# Patient Record
Sex: Female | Born: 1960 | Race: White | Hispanic: No | Marital: Married | Smoking: Former smoker
Health system: Southern US, Community
[De-identification: ages and names within clinical notes are randomized; demographics above are authoritative.]

## PROBLEM LIST (undated history)

## (undated) DIAGNOSIS — M1990 Unspecified osteoarthritis, unspecified site: Secondary | ICD-10-CM

## (undated) DIAGNOSIS — F419 Anxiety disorder, unspecified: Secondary | ICD-10-CM

## (undated) DIAGNOSIS — N951 Menopausal and female climacteric states: Secondary | ICD-10-CM

## (undated) DIAGNOSIS — K5900 Constipation, unspecified: Secondary | ICD-10-CM

## (undated) DIAGNOSIS — K219 Gastro-esophageal reflux disease without esophagitis: Secondary | ICD-10-CM

## (undated) DIAGNOSIS — J439 Emphysema, unspecified: Secondary | ICD-10-CM

## (undated) DIAGNOSIS — M797 Fibromyalgia: Secondary | ICD-10-CM

## (undated) DIAGNOSIS — C3490 Malignant neoplasm of unspecified part of unspecified bronchus or lung: Secondary | ICD-10-CM

## (undated) DIAGNOSIS — I10 Essential (primary) hypertension: Secondary | ICD-10-CM

## (undated) HISTORY — DX: Menopausal and female climacteric states: N95.1

## (undated) HISTORY — PX: THORACOTOMY: SUR1349

## (undated) HISTORY — DX: Fibromyalgia: M79.7

## (undated) HISTORY — DX: Essential (primary) hypertension: I10

## (undated) HISTORY — DX: Constipation, unspecified: K59.00

## (undated) HISTORY — DX: Anxiety disorder, unspecified: F41.9

## (undated) HISTORY — PX: PORTACATH PLACEMENT: SHX2246

## (undated) HISTORY — PX: TUBAL LIGATION: SHX77

## (undated) HISTORY — DX: Malignant neoplasm of unspecified part of unspecified bronchus or lung: C34.90

---

## 2010-01-24 DIAGNOSIS — C3490 Malignant neoplasm of unspecified part of unspecified bronchus or lung: Secondary | ICD-10-CM

## 2010-01-24 HISTORY — DX: Malignant neoplasm of unspecified part of unspecified bronchus or lung: C34.90

## 2010-02-10 ENCOUNTER — Ambulatory Visit
Admission: RE | Admit: 2010-02-10 | Discharge: 2010-02-10 | Payer: Self-pay | Source: Home / Self Care | Attending: Thoracic Surgery | Admitting: Thoracic Surgery

## 2010-02-15 ENCOUNTER — Ambulatory Visit (HOSPITAL_COMMUNITY)
Admission: RE | Admit: 2010-02-15 | Discharge: 2010-02-15 | Payer: Self-pay | Source: Home / Self Care | Attending: Thoracic Surgery | Admitting: Thoracic Surgery

## 2010-02-16 ENCOUNTER — Ambulatory Visit (HOSPITAL_COMMUNITY)
Admission: RE | Admit: 2010-02-16 | Discharge: 2010-02-16 | Payer: Self-pay | Source: Home / Self Care | Attending: Thoracic Surgery | Admitting: Thoracic Surgery

## 2010-02-16 ENCOUNTER — Other Ambulatory Visit: Payer: Self-pay | Admitting: Interventional Radiology

## 2010-02-17 LAB — CBC
MCH: 28.8 pg (ref 26.0–34.0)
MCHC: 31.8 g/dL (ref 30.0–36.0)
MCV: 90.7 fL (ref 78.0–100.0)
Platelets: 493 10*3/uL — ABNORMAL HIGH (ref 150–400)
RBC: 4.3 MIL/uL (ref 3.87–5.11)

## 2010-02-18 ENCOUNTER — Ambulatory Visit (HOSPITAL_COMMUNITY)
Admission: RE | Admit: 2010-02-18 | Discharge: 2010-02-18 | Payer: Self-pay | Source: Home / Self Care | Attending: Thoracic Surgery | Admitting: Thoracic Surgery

## 2010-02-18 LAB — GLUCOSE, CAPILLARY: Glucose-Capillary: 107 mg/dL — ABNORMAL HIGH (ref 70–99)

## 2010-02-19 ENCOUNTER — Ambulatory Visit (HOSPITAL_COMMUNITY)
Admission: RE | Admit: 2010-02-19 | Discharge: 2010-02-19 | Payer: Self-pay | Source: Home / Self Care | Attending: Thoracic Surgery | Admitting: Thoracic Surgery

## 2010-02-19 ENCOUNTER — Ambulatory Visit
Admission: RE | Admit: 2010-02-19 | Discharge: 2010-02-19 | Payer: Self-pay | Source: Home / Self Care | Attending: Thoracic Surgery | Admitting: Thoracic Surgery

## 2010-02-19 NOTE — Letter (Signed)
February 19, 2010  Dr. Samuel Jester [address] [place], [place] [address]  Re:  [name], [name]               DOB:  Jul 14, 1960  Dear Dr. Charm Barges:  I saw the patient back in the office today after we completed all of her test.  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  A CT scan of the brain was negative for metastasis.  The CAT scan was positive in this area.  There was some question there might be some supraclavicular nodes that might be positive, but there is no mediastinal or hilar nodes that were positive.  She has a stage IIIA Pancoast tumor.  The treatment for this is radiation with chemotherapy.  She would like to have her treatments done in [place] and I will make arrangements next week to have her seen there to start radiation and chemotherapy.  After the radiation chemotherapy, then we will consider for resection of this tumor.  I appreciate the opportunity of seeing the patient.  I discussed this with her and her son.  Sincerely,  Ines Bloomer, M.D. Electronically Signed  DPB/MEDQ  D:  02/19/2010  T:  02/19/2010  Job:  [id number]

## 2010-02-24 ENCOUNTER — Encounter: Payer: Self-pay | Admitting: Thoracic Surgery

## 2010-03-03 ENCOUNTER — Ambulatory Visit: Payer: BC Managed Care – PPO | Admitting: Radiation Oncology

## 2010-03-03 ENCOUNTER — Ambulatory Visit: Payer: BC Managed Care – PPO | Attending: Radiation Oncology | Admitting: Radiation Oncology

## 2010-03-03 DIAGNOSIS — C3410 Malignant neoplasm of upper lobe, unspecified bronchus or lung: Secondary | ICD-10-CM | POA: Insufficient documentation

## 2010-03-05 ENCOUNTER — Encounter (HOSPITAL_COMMUNITY)
Admission: RE | Admit: 2010-03-05 | Discharge: 2010-03-05 | Disposition: A | Discharge status: Home / Self Care | Payer: BC Managed Care – PPO | Source: Ambulatory Visit | Attending: Thoracic Surgery | Admitting: Thoracic Surgery

## 2010-03-05 ENCOUNTER — Ambulatory Visit (HOSPITAL_COMMUNITY)
Admission: RE | Admit: 2010-03-05 | Discharge: 2010-03-05 | Disposition: A | Discharge status: Home / Self Care | Payer: BC Managed Care – PPO | Source: Ambulatory Visit | Attending: Thoracic Surgery | Admitting: Thoracic Surgery

## 2010-03-05 ENCOUNTER — Other Ambulatory Visit: Payer: Self-pay | Admitting: Thoracic Surgery

## 2010-03-05 DIAGNOSIS — Z01818 Encounter for other preprocedural examination: Secondary | ICD-10-CM | POA: Insufficient documentation

## 2010-03-05 DIAGNOSIS — C3490 Malignant neoplasm of unspecified part of unspecified bronchus or lung: Secondary | ICD-10-CM

## 2010-03-05 DIAGNOSIS — J984 Other disorders of lung: Secondary | ICD-10-CM | POA: Insufficient documentation

## 2010-03-05 LAB — COMPREHENSIVE METABOLIC PANEL
ALT: 23 U/L (ref 0–35)
Albumin: 3.7 g/dL (ref 3.5–5.2)
Alkaline Phosphatase: 141 U/L — ABNORMAL HIGH (ref 39–117)
Glucose, Bld: 92 mg/dL (ref 70–99)
Potassium: 4.1 mEq/L (ref 3.5–5.1)
Sodium: 137 mEq/L (ref 135–145)
Total Protein: 7.2 g/dL (ref 6.0–8.3)

## 2010-03-05 LAB — APTT: aPTT: 38 seconds — ABNORMAL HIGH (ref 24–37)

## 2010-03-05 LAB — CBC
HCT: 38.2 % (ref 36.0–46.0)
Hemoglobin: 12.5 g/dL (ref 12.0–15.0)
MCHC: 32.7 g/dL (ref 30.0–36.0)
MCV: 90.1 fL (ref 78.0–100.0)

## 2010-03-05 LAB — PROTIME-INR: INR: 0.97 (ref 0.00–1.49)

## 2010-03-08 ENCOUNTER — Ambulatory Visit (HOSPITAL_COMMUNITY)
Admission: RE | Admit: 2010-03-08 | Discharge: 2010-03-08 | Disposition: A | Discharge status: Home / Self Care | Payer: BC Managed Care – PPO | Source: Ambulatory Visit | Attending: Thoracic Surgery | Admitting: Thoracic Surgery

## 2010-03-08 ENCOUNTER — Ambulatory Visit (HOSPITAL_COMMUNITY): Payer: BC Managed Care – PPO

## 2010-03-08 DIAGNOSIS — C3490 Malignant neoplasm of unspecified part of unspecified bronchus or lung: Secondary | ICD-10-CM

## 2010-03-08 DIAGNOSIS — I10 Essential (primary) hypertension: Secondary | ICD-10-CM | POA: Insufficient documentation

## 2010-03-08 DIAGNOSIS — Z01818 Encounter for other preprocedural examination: Secondary | ICD-10-CM | POA: Insufficient documentation

## 2010-03-08 DIAGNOSIS — C3410 Malignant neoplasm of upper lobe, unspecified bronchus or lung: Secondary | ICD-10-CM | POA: Insufficient documentation

## 2010-03-08 HISTORY — PX: PORTACATH PLACEMENT: SHX2246

## 2010-03-11 NOTE — Op Note (Signed)
  NAMESINDIA, KOWALCZYK             ACCOUNT NO.:  [id number]  MEDICAL RECORD NO.:  [id number]           PATIENT TYPE:  O  LOCATION:  [facility]SC                         FACILITY:  [facility]  PHYSICIAN:  Ines Bloomer, M.D. DATE OF BIRTH:  04-06-1960  DATE OF PROCEDURE: DATE OF DISCHARGE:  03/08/2010                              OPERATIVE REPORT   PREOPERATIVE DIAGNOSIS:  Non-small cell lung cancer, left upper lobe, Pancoast or superior sulcus tumor.  POSTOPERATIVE DIAGNOSIS:  Non-small cell lung cancer, left upper lobe, Pancoast or superior sulcus tumor.  OPERATION PERFORMED:  Right IJ Port-A-Cath.  After local anesthesia with Cetacaine, Xylocaine and IV sedation, right IJ area was infiltrated with 1% Xylocaine suprapubically and right IJ puncture was performed and a guidewire threaded under fluoro on to the right atrium.  Stab wound was made around the guidewire.  Another area was infiltrated with 1% Xylocaine inferior to this.  A transverse incision was made and dissection was carried down through the subcutaneous tissue to the pretracheal fascia.  Pocket was dissected out and a 9.6 preattached Bard Port-A-Cath was inserted and then sutured in place with 2-0 silk.  The tubing was then tunneled from the pocket to the stab wound and measured appropriately with fluoro to go to the right atrial SVC junction.  With a guidewire was passed the dilator with a peel-away sheath, the guidewire and the dilator were removed and through the peel-away sheath was passed the tubing.  The peel-away sheath was removed and was confirmed to be in the distal SVC with fluoro.  It flushed easily and withdrew easily.  Wound was closed with 3-0 Vicryl and Dermabond for the skin.  The patient was turned to recovery room in stable condition.     Ines Bloomer, M.D.     DPB/MEDQ  D:  03/08/2010  T:  03/08/2010  Job:  [id number]  Electronically Signed by Jovita Gamma M.D. on 03/11/2010 03:39:42 PM

## 2010-03-16 ENCOUNTER — Ambulatory Visit (INDEPENDENT_AMBULATORY_CARE_PROVIDER_SITE_OTHER): Payer: Self-pay | Admitting: Thoracic Surgery

## 2010-03-16 DIAGNOSIS — C3410 Malignant neoplasm of upper lobe, unspecified bronchus or lung: Secondary | ICD-10-CM

## 2010-03-17 NOTE — Assessment & Plan Note (Signed)
OFFICE VISIT  [name], [name] DOB:  Feb 26, 1960                                        March 16, 2010 CHART #:  [id number]  HISTORY:  The patient is a 50 year old female.  We have been consulted on for a stage IIIA Pancoast tumor.  She has planned a chemotherapy and radiation prior to proceeding with any surgical intervention.  A right internal jugular Port-A-Cath was placed by Dr. Edwyna Shell on March 08, 2010.  We are seeing her in the office just to evaluate the surgical site.  PHYSICAL EXAMINATION:  Vital Signs:  Blood pressure is 102/65, pulse was 84, respirations 14, oxygen saturation is 97% on room air.  General Appearance:  This is a well-developed adult female in no acute distress. Pulmonary examination reveals clear lungs bilaterally.  Cardiac Examination:  Regular rate and rhythm.  The catheter site is inspected, healing well without evidence of infection.  There is no evidence of hematoma.  It appears to be healing quite well.  ASSESSMENT:  The patient is very stable in regard to her Port-A-Cath placement.  She is to continue her current treatment plan with chemo radiation and hopefully, there will be some ability to proceed with surgical resection after reevaluation down the road.  At this time, we do not have a plan to follow up appointment, but we will certainly see her on a p.r.n. basis and also after her therapies have included to evaluate for surgical options.  Rowe Clack, P.A.-C.  Sherryll Burger  D:  03/16/2010  T:  03/17/2010  Job:  [id number]  cc:   Samuel Jester, DO Prudy Feeler, [place] Radene Gunning, M.D., Ph.D. Earma Reading, MD

## 2010-06-01 ENCOUNTER — Ambulatory Visit (INDEPENDENT_AMBULATORY_CARE_PROVIDER_SITE_OTHER): Payer: BC Managed Care – PPO | Admitting: Thoracic Surgery

## 2010-06-01 DIAGNOSIS — C3490 Malignant neoplasm of unspecified part of unspecified bronchus or lung: Secondary | ICD-10-CM

## 2010-06-02 NOTE — Letter (Signed)
Jun 01, 2010  Boris M. Darovsky, MD [address] [place], [place] [address]  Re:  MAIE, KESINGER               DOB:  [birth year]  Dear Dr. Ubaldo Glassing:  I saw the patient back today.  She is doing well.  She had a good response to her radiation chemotherapy.  The lesion has shrunk from 4.4 x 3.6 to 2.8 x 2.1 cm.  She still may have some involvement of her subclavian artery and possibly the subclavian vein. She has had no more pain and hopefully that there is minimal involvement of this subclavian artery and of the brachial plexus.  We plan to get an MRI at this just to be sure and we will see her again in one week to re-discuss the possibility of surgery which we tentatively scheduled for May 21 at [facility].  Her blood pressure is 146/84, pulse 78, respirations 20, and sats were 96%.  Ines Bloomer, M.D. Electronically Signed  DPB/MEDQ  D:  06/01/2010  T:  06/02/2010  Job:  [id number]

## 2010-06-08 NOTE — Letter (Signed)
February 10, 2010   [name] Jester, DO  [name] [address]  [place], [place] [address]   Re:  [name], [name]               DOB:  1960-03-12   Dear Dr. Charm [name]:   I saw the patient in the office today.  This 50 year old patient was  married 2 years ago and has been a long-time smoker.  She comes in today  with her husband.  She started having some left neck and back pain and  underwent a MRI and then a CT scan which showed an anterior Pancoast or  superior sulcus tumor with involvement of the subclavian artery vein,  probably the brachial plexus.  The mass is at least for 4.6 x 4.5 cm  with elevation of the first and second rib and comes right up to the  right vertebral artery and partially encases the left subclavian artery  and vein.  She has no hemoptysis, fever, chills, or excessive sputum.   MEDICATIONS:  1. Lisinopril 40 mg daily for hypertension.  2. Xanax 0.5.  3. She is on antidepressant.   ALLERGIES:  She has no allergies.   FAMILY HISTORY:  Noncontributory.   SOCIAL HISTORY:  She is married and has two children.  She is self-  employed.  She has quitting smoking but smoked half pack of cigarettes a  day.   REVIEW OF SYSTEMS:  GENERAL:  She is 5 feet 4 inches, 135 pounds.  Her  weight has been stable.  CARDIAC:  No angina or atrial fibrillation.  PULMONARY:  No hemoptysis, fever, chills.  GI:  No nausea, vomiting, constipation, or diarrhea.  GU:  No kidney disease, dysuria, or frequent urination.  VASCULAR:  No claudication, DVT, TIAs.  NEUROLOGIC:  No dizziness, headaches, blackouts, or seizures.  MUSCULOSKELETAL:  See history of present illness.  GI:  No depression or nervousness.  ENT:  No change in eyesight or hearing.  HEMATOLOGIC:  No problems with bleeding, clotting disorders, or anemia.   PHYSICAL EXAMINATION:  General:  She is a well-developed Caucasian  female in no acute distress.  Vital Signs:  Her blood pressure is  151/91, pulse 88, respirations 20, sats  are 99%.  Head, Eyes, Ears,  Nose, and Throat:  Unremarkable.  Neck:  Supple on the right side and  there is a questionable palpable mass in the supraclavicular area on the  left side and some mild tenderness.  Lungs:  Clear to auscultation and  percussion.  Heart:  Regular sinus rhythm.  No murmur.  Abdomen:  Soft.  There is no splenomegaly.  Extremities:  Pulses are 2+.  There is no  clubbing or edema.  Neurologic:  She is oriented x3.  Sensory and motor  intact.  Cranial nerves intact.   ASSESSMENT AND PLAN:  I think unfortunately she has an anterior Pancoast  tumor with both neurological and vessel involvement.  For this reason,  we will get a brain scan, PET scan, and PFTs with DLCO, and then I will  go ahead and get a needle biopsy on her and then we will reevaluate her.  She will definitely need preop radiation and chemotherapy and possibly  surgery.  She is not a surgical candidate.  She will need full-dose  radiation therapy.   [name], M.D.  Electronically Signed   [name]/[name]  D:  02/10/2010  T:  02/10/2010  [name]:  [id number]   cc:   [name] Loffler, PA

## 2010-06-09 ENCOUNTER — Other Ambulatory Visit (HOSPITAL_COMMUNITY): Payer: BC Managed Care – PPO

## 2010-06-09 ENCOUNTER — Encounter (HOSPITAL_COMMUNITY)
Admission: RE | Admit: 2010-06-09 | Discharge: 2010-06-09 | Disposition: A | Discharge status: Home / Self Care | Payer: BC Managed Care – PPO | Source: Ambulatory Visit | Attending: Thoracic Surgery | Admitting: Thoracic Surgery

## 2010-06-09 ENCOUNTER — Ambulatory Visit (INDEPENDENT_AMBULATORY_CARE_PROVIDER_SITE_OTHER): Payer: BC Managed Care – PPO | Admitting: Thoracic Surgery

## 2010-06-09 DIAGNOSIS — D491 Neoplasm of unspecified behavior of respiratory system: Secondary | ICD-10-CM

## 2010-06-09 DIAGNOSIS — Z01812 Encounter for preprocedural laboratory examination: Secondary | ICD-10-CM | POA: Insufficient documentation

## 2010-06-09 DIAGNOSIS — Z01818 Encounter for other preprocedural examination: Secondary | ICD-10-CM | POA: Insufficient documentation

## 2010-06-09 LAB — CBC
Hemoglobin: 9.8 g/dL — ABNORMAL LOW (ref 12.0–15.0)
MCH: 30.4 pg (ref 26.0–34.0)
MCV: 91 fL (ref 78.0–100.0)
RBC: 3.22 MIL/uL — ABNORMAL LOW (ref 3.87–5.11)

## 2010-06-09 LAB — APTT: aPTT: 31 seconds (ref 24–37)

## 2010-06-09 LAB — URINALYSIS, ROUTINE W REFLEX MICROSCOPIC
Bilirubin Urine: NEGATIVE
Hgb urine dipstick: NEGATIVE
Specific Gravity, Urine: 1.01 (ref 1.005–1.030)
pH: 6 (ref 5.0–8.0)

## 2010-06-09 LAB — BLOOD GAS, ARTERIAL
Acid-Base Excess: 2.8 mmol/L — ABNORMAL HIGH (ref 0.0–2.0)
Bicarbonate: 26.6 mEq/L — ABNORMAL HIGH (ref 20.0–24.0)
O2 Saturation: 96.6 %
pO2, Arterial: 81.8 mmHg (ref 80.0–100.0)

## 2010-06-09 LAB — COMPREHENSIVE METABOLIC PANEL
ALT: 27 U/L (ref 0–35)
BUN: 21 mg/dL (ref 6–23)
CO2: 26 mEq/L (ref 19–32)
Calcium: 9.3 mg/dL (ref 8.4–10.5)
Creatinine, Ser: 0.79 mg/dL (ref 0.4–1.2)
GFR calc non Af Amer: >60 mL/min (ref >60)
Glucose, Bld: 105 mg/dL — ABNORMAL HIGH (ref 70–99)

## 2010-06-09 LAB — ABO/RH: ABO/RH(D): B POS

## 2010-06-09 LAB — PROTIME-INR: INR: 0.93 (ref 0.00–1.49)

## 2010-06-09 LAB — SURGICAL PCR SCREEN: MRSA, PCR: NEGATIVE

## 2010-06-10 NOTE — Letter (Signed)
Jun 09, 2010  Boris M. Darovsky, MD [address] [place], [place] [address]  Re:  AZAYLA, POLO               DOB:  July 07, 1960  Dear Dr. Ubaldo Glassing:  I saw the patient back today after her MRI which shows that she probably has some involvement of T1 nerve root and the first and second ribs. The subclavian artery runs right over the mass but hopefully we will be able to not have to resect the subclavian artery.  Plan to do an anterior approach to the Pancoast or superior sulcus tumor with a partial sternectomy and rib resection of the first, second, and possibly third ribs.  We will do this on Monday.  I explained to her the risk of the procedure including resection of the subclavian artery, resection of the lower portion of the brachial plexus which C8 and T1, Horner syndrome, infection, as well as hemorrhage.  We will also plan to do a left lower lobectomy and may have to make a counterincision also to do this.  She understands the possibilities and we plan to proceed with surgery.  Her blood pressure was 130/80, pulse 78, respirations 18, and sats were 97%.  Ines Bloomer, M.D. Electronically Signed  DPB/MEDQ  D:  06/09/2010  T:  06/10/2010  Job:  [id number]

## 2010-06-14 NOTE — H&P (Signed)
NAMEGERARDA, [name]             ACCOUNT NO.:  [id number]  MEDICAL RECORD NO.:  [id number]           PATIENT TYPE:  LOCATION:                                 FACILITY:  PHYSICIAN:  Ines Bloomer, M.D. DATE OF BIRTH:  08/07/1960  DATE OF ADMISSION: DATE OF DISCHARGE:                             HISTORY & PHYSICAL   CHIEF COMPLAINT:  Left lung mass.  HISTORY OF PRESENT ILLNESS:  This is a 50 year old patient who presented with severe chest pain several months ago and was found to have a superior sulcus tumor of the left upper lobe that was adenocarcinoma. It had invaded a T1 nerve root causing pain and was close to the left subclavian artery.  It was 4.4 x 3.6 and repeat CT scan showed that it has shrunk to 2.8 x 2.1, although on MRI it was somewhat bigger by measurements.  It was thought to be on MRI to involve the T1 nerve.  The scalene medius probably was close to the left subclavian artery and involved the first and second ribs.  She has got a good response to radiation and chemotherapy.  She is being admitted for a resection of the superior sulcus tumor using the anterior approach.  MEDICATIONS:  Lisinopril 40 mg a day, Xanax, hydrocodone p.r.n., and Paxil 20 mg daily.  She is not allergic to any medications.  She has no allergies.  FAMILY HISTORY:  Noncontributory.  SOCIAL HISTORY:  She is married, has two children, self-employed, owns her own business, quit smoking just recently.  REVIEW OF SYSTEMS:  GENERAL:  She is 5 feet 4 inches, 135 pounds. Weight is stable.  CARDIAC:  No angina or atrial fibrillation. PULMONARY:  See history of present illness.  No hemoptysis. GASTROINTESTINAL:  No nausea, vomiting, constipation, or diarrhea. GENITOURINARY:  No kidney disease, dysuria, or frequent urination. VASCULAR:  No claudication, DVT, TIAs.  NEUROLOGICAL:  No dizziness, headaches, blackouts, seizures.  MUSCULOSKELETAL:  See history of present illness.  PSYCHIATRIC:   Some questionable depression.  No nervousness.  ENT:  No change in her eyesight or hearing.  HEMATOLOGIC: No problems with bleeding, clotting disorders, or anemia.  PHYSICAL EXAMINATION:  GENERAL:  She is a well-developed Caucasian female, in no acute distress. VITAL SIGNS:  Her blood pressure is 146/84, pulse 78, respirations 20, sats were 96%. HEENT:  Head is atraumatic.  Eyes:  Pupils equal and reactive to light and accommodation.  Extraocular movements are normal.  Ears:  Tympanic membranes are intact.  Nose:  There is no septal deviation.  Throat: Without lesion. NECK:  Supple without thyromegaly.  There are no palpable masses in the left supraclavicular area, although there is some questionable fullness. CHEST:  Clear to auscultation and percussion. HEART:  Regular sinus rhythm.  No murmurs. ABDOMEN:  Soft.  There is no hepatosplenomegaly. EXTREMITIES:  Pulses 2+.  There is no clubbing or edema. NEUROLOGIC:  She is oriented x3.  Sensory and motor intact.  Cranial nerves are intact.  Pulmonary function test:  FEV-1 is 3.39 and 99% predicted.  FVC is 3.39 and 99% predicted, and FEV-1 is 2.4, 88% predicted, diffusion capacity  is 73%.  IMPRESSION: 1. 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 2. History of tobacco abuse. 3. Situational depression.  PLAN:  Left anterior thoracotomy with left upper lobectomy, wedge resection, and resection of Pancoast tumor possible, and reconstruction of chest wall.     Ines Bloomer, M.D.     DPB/MEDQ  D:  06/09/2010  T:  06/10/2010  Job:  [id number]  Electronically Signed by Jovita Gamma M.D. on 06/14/2010 02:19:05 PM

## 2010-06-23 ENCOUNTER — Encounter (HOSPITAL_COMMUNITY)
Admission: RE | Admit: 2010-06-23 | Discharge: 2010-06-23 | Disposition: A | Discharge status: Home / Self Care | Payer: BC Managed Care – PPO | Source: Ambulatory Visit | Attending: Thoracic Surgery | Admitting: Thoracic Surgery

## 2010-06-23 ENCOUNTER — Other Ambulatory Visit: Payer: Self-pay | Admitting: Thoracic Surgery

## 2010-06-23 ENCOUNTER — Ambulatory Visit (HOSPITAL_COMMUNITY)
Admission: RE | Admit: 2010-06-23 | Discharge: 2010-06-23 | Disposition: A | Discharge status: Home / Self Care | Payer: BC Managed Care – PPO | Source: Ambulatory Visit | Attending: Thoracic Surgery | Admitting: Thoracic Surgery

## 2010-06-23 ENCOUNTER — Ambulatory Visit (INDEPENDENT_AMBULATORY_CARE_PROVIDER_SITE_OTHER): Payer: Self-pay | Admitting: Thoracic Surgery

## 2010-06-23 DIAGNOSIS — Z01818 Encounter for other preprocedural examination: Secondary | ICD-10-CM | POA: Insufficient documentation

## 2010-06-23 DIAGNOSIS — Z01811 Encounter for preprocedural respiratory examination: Secondary | ICD-10-CM

## 2010-06-23 DIAGNOSIS — Z01812 Encounter for preprocedural laboratory examination: Secondary | ICD-10-CM | POA: Insufficient documentation

## 2010-06-23 DIAGNOSIS — D491 Neoplasm of unspecified behavior of respiratory system: Secondary | ICD-10-CM

## 2010-06-23 LAB — CBC
Hemoglobin: 10.6 g/dL — ABNORMAL LOW (ref 12.0–15.0)
MCH: 30.6 pg (ref 26.0–34.0)
MCHC: 33.7 g/dL (ref 30.0–36.0)

## 2010-06-23 LAB — PROTIME-INR
INR: 0.95 (ref 0.00–1.49)
Prothrombin Time: 12.9 seconds (ref 11.6–15.2)

## 2010-06-23 LAB — COMPREHENSIVE METABOLIC PANEL
AST: 38 U/L — ABNORMAL HIGH (ref 0–37)
Albumin: 3.4 g/dL — ABNORMAL LOW (ref 3.5–5.2)
Calcium: 9 mg/dL (ref 8.4–10.5)
Creatinine, Ser: 0.91 mg/dL (ref 0.4–1.2)
GFR calc Af Amer: >60 mL/min (ref >60)
GFR calc non Af Amer: >60 mL/min (ref >60)

## 2010-06-23 LAB — BLOOD GAS, ARTERIAL
Bicarbonate: 26.1 mEq/L — ABNORMAL HIGH (ref 20.0–24.0)
FIO2: 0.21 %
O2 Saturation: 95.7 %
Patient temperature: 98.6

## 2010-06-23 LAB — URINALYSIS, ROUTINE W REFLEX MICROSCOPIC
Bilirubin Urine: NEGATIVE
Glucose, UA: NEGATIVE mg/dL
Hgb urine dipstick: NEGATIVE
Ketones, ur: NEGATIVE mg/dL
Protein, ur: NEGATIVE mg/dL

## 2010-06-23 LAB — APTT: aPTT: 28 seconds (ref 24–37)

## 2010-06-25 ENCOUNTER — Inpatient Hospital Stay (HOSPITAL_COMMUNITY): Payer: BC Managed Care – PPO

## 2010-06-25 ENCOUNTER — Inpatient Hospital Stay (HOSPITAL_COMMUNITY)
Admission: RE | Admit: 2010-06-25 | Discharge: 2010-07-01 | DRG: 075 | Disposition: A | Discharge status: Home / Self Care | Payer: BC Managed Care – PPO | Source: Ambulatory Visit | Attending: Thoracic Surgery | Admitting: Thoracic Surgery

## 2010-06-25 ENCOUNTER — Other Ambulatory Visit: Payer: Self-pay | Admitting: Thoracic Surgery

## 2010-06-25 ENCOUNTER — Inpatient Hospital Stay (HOSPITAL_COMMUNITY)
Admission: RE | Admit: 2010-06-25 | Payer: BC Managed Care – PPO | Source: Ambulatory Visit | Admitting: Thoracic Surgery

## 2010-06-25 DIAGNOSIS — E876 Hypokalemia: Secondary | ICD-10-CM | POA: Diagnosis not present

## 2010-06-25 DIAGNOSIS — Z79899 Other long term (current) drug therapy: Secondary | ICD-10-CM

## 2010-06-25 DIAGNOSIS — C761 Malignant neoplasm of thorax: Secondary | ICD-10-CM

## 2010-06-25 DIAGNOSIS — C3410 Malignant neoplasm of upper lobe, unspecified bronchus or lung: Principal | ICD-10-CM | POA: Diagnosis present

## 2010-06-25 DIAGNOSIS — Z23 Encounter for immunization: Secondary | ICD-10-CM

## 2010-06-25 DIAGNOSIS — I10 Essential (primary) hypertension: Secondary | ICD-10-CM | POA: Diagnosis present

## 2010-06-25 DIAGNOSIS — Z01812 Encounter for preprocedural laboratory examination: Secondary | ICD-10-CM

## 2010-06-25 DIAGNOSIS — Z01810 Encounter for preprocedural cardiovascular examination: Secondary | ICD-10-CM

## 2010-06-25 DIAGNOSIS — Z01811 Encounter for preprocedural respiratory examination: Secondary | ICD-10-CM

## 2010-06-25 DIAGNOSIS — F4321 Adjustment disorder with depressed mood: Secondary | ICD-10-CM | POA: Diagnosis present

## 2010-06-25 DIAGNOSIS — Z87891 Personal history of nicotine dependence: Secondary | ICD-10-CM

## 2010-06-25 DIAGNOSIS — D62 Acute posthemorrhagic anemia: Secondary | ICD-10-CM | POA: Diagnosis not present

## 2010-06-25 HISTORY — PX: THORACOTOMY: SUR1349

## 2010-06-26 ENCOUNTER — Inpatient Hospital Stay (HOSPITAL_COMMUNITY): Payer: BC Managed Care – PPO

## 2010-06-26 LAB — BASIC METABOLIC PANEL
CO2: 25 mEq/L (ref 19–32)
Calcium: 7.8 mg/dL — ABNORMAL LOW (ref 8.4–10.5)
Chloride: 102 mEq/L (ref 96–112)
Glucose, Bld: 160 mg/dL — ABNORMAL HIGH (ref 70–99)
Sodium: 136 mEq/L (ref 135–145)

## 2010-06-26 LAB — POCT I-STAT 3, ART BLOOD GAS (G3+)
Acid-base deficit: 1 mmol/L (ref 0.0–2.0)
Bicarbonate: 24.6 mEq/L — ABNORMAL HIGH (ref 20.0–24.0)
O2 Saturation: 93 %
pCO2 arterial: 41.5 mmHg (ref 35.0–45.0)
pO2, Arterial: 68 mmHg — ABNORMAL LOW (ref 80.0–100.0)

## 2010-06-26 LAB — TYPE AND SCREEN: Unit division: 0

## 2010-06-26 LAB — GLUCOSE, CAPILLARY
Glucose-Capillary: 112 mg/dL — ABNORMAL HIGH (ref 70–99)
Glucose-Capillary: 117 mg/dL — ABNORMAL HIGH (ref 70–99)

## 2010-06-26 LAB — CBC
HCT: 32.9 % — ABNORMAL LOW (ref 36.0–46.0)
Hemoglobin: 10.8 g/dL — ABNORMAL LOW (ref 12.0–15.0)
MCHC: 32.8 g/dL (ref 30.0–36.0)
RBC: 3.52 MIL/uL — ABNORMAL LOW (ref 3.87–5.11)

## 2010-06-27 ENCOUNTER — Inpatient Hospital Stay (HOSPITAL_COMMUNITY): Payer: BC Managed Care – PPO

## 2010-06-27 LAB — COMPREHENSIVE METABOLIC PANEL
ALT: 16 U/L (ref 0–35)
AST: 18 U/L (ref 0–37)
Albumin: 2.3 g/dL — ABNORMAL LOW (ref 3.5–5.2)
Alkaline Phosphatase: 60 U/L (ref 39–117)
Chloride: 100 mEq/L (ref 96–112)
GFR calc Af Amer: >60 mL/min (ref >60)
Potassium: 4.1 mEq/L (ref 3.5–5.1)
Sodium: 133 mEq/L — ABNORMAL LOW (ref 135–145)
Total Bilirubin: 0.4 mg/dL (ref 0.3–1.2)
Total Protein: 5.3 g/dL — ABNORMAL LOW (ref 6.0–8.3)

## 2010-06-27 LAB — CBC
MCV: 94 fL (ref 78.0–100.0)
Platelets: 300 10*3/uL (ref 150–400)
RBC: 3.16 MIL/uL — ABNORMAL LOW (ref 3.87–5.11)
RDW: 15.5 % (ref 11.5–15.5)
WBC: 19.5 10*3/uL — ABNORMAL HIGH (ref 4.0–10.5)

## 2010-06-27 LAB — GLUCOSE, CAPILLARY: Glucose-Capillary: 101 mg/dL — ABNORMAL HIGH (ref 70–99)

## 2010-06-28 ENCOUNTER — Inpatient Hospital Stay (HOSPITAL_COMMUNITY): Payer: BC Managed Care – PPO

## 2010-06-28 LAB — GLUCOSE, CAPILLARY
Glucose-Capillary: 97 mg/dL (ref 70–99)
Glucose-Capillary: 80 mg/dL (ref 70–99)

## 2010-06-28 LAB — COMPREHENSIVE METABOLIC PANEL
AST: 17 U/L (ref 0–37)
CO2: 27 mEq/L (ref 19–32)
Calcium: 8.7 mg/dL (ref 8.4–10.5)
Creatinine, Ser: 0.59 mg/dL (ref 0.4–1.2)
GFR calc Af Amer: >60 mL/min (ref >60)
GFR calc non Af Amer: >60 mL/min (ref >60)
Glucose, Bld: 112 mg/dL — ABNORMAL HIGH (ref 70–99)

## 2010-06-28 LAB — CBC
Hemoglobin: 8.9 g/dL — ABNORMAL LOW (ref 12.0–15.0)
MCH: 31 pg (ref 26.0–34.0)
MCHC: 33.3 g/dL (ref 30.0–36.0)
RDW: 14.9 % (ref 11.5–15.5)

## 2010-06-29 ENCOUNTER — Inpatient Hospital Stay (HOSPITAL_COMMUNITY): Payer: BC Managed Care – PPO

## 2010-06-29 LAB — CBC
HCT: 25.7 % — ABNORMAL LOW (ref 36.0–46.0)
Hemoglobin: 8.7 g/dL — ABNORMAL LOW (ref 12.0–15.0)
MCH: 31.4 pg (ref 26.0–34.0)
MCHC: 33.9 g/dL (ref 30.0–36.0)

## 2010-06-29 LAB — BASIC METABOLIC PANEL
CO2: 30 mEq/L (ref 19–32)
Calcium: 9.1 mg/dL (ref 8.4–10.5)
Creatinine, Ser: 0.61 mg/dL (ref 0.4–1.2)
Glucose, Bld: 107 mg/dL — ABNORMAL HIGH (ref 70–99)

## 2010-06-29 LAB — GLUCOSE, CAPILLARY

## 2010-06-30 ENCOUNTER — Inpatient Hospital Stay (HOSPITAL_COMMUNITY): Payer: BC Managed Care – PPO

## 2010-06-30 LAB — GLUCOSE, CAPILLARY: Glucose-Capillary: 99 mg/dL (ref 70–99)

## 2010-07-01 ENCOUNTER — Inpatient Hospital Stay (HOSPITAL_COMMUNITY): Payer: BC Managed Care – PPO

## 2010-07-01 LAB — BASIC METABOLIC PANEL
CO2: 23 mEq/L (ref 19–32)
Calcium: 6.5 mg/dL — ABNORMAL LOW (ref 8.4–10.5)
Chloride: 109 mEq/L (ref 96–112)
Sodium: 140 mEq/L (ref 135–145)

## 2010-07-01 LAB — CBC
Hemoglobin: 7.6 g/dL — ABNORMAL LOW (ref 12.0–15.0)
MCHC: 34.2 g/dL (ref 30.0–36.0)
RBC: 2.44 MIL/uL — ABNORMAL LOW (ref 3.87–5.11)

## 2010-07-05 ENCOUNTER — Other Ambulatory Visit: Payer: Self-pay | Admitting: Thoracic Surgery

## 2010-07-05 ENCOUNTER — Ambulatory Visit
Admission: RE | Admit: 2010-07-05 | Discharge: 2010-07-05 | Disposition: A | Discharge status: Home / Self Care | Payer: BC Managed Care – PPO | Source: Ambulatory Visit | Attending: Thoracic Surgery | Admitting: Thoracic Surgery

## 2010-07-05 DIAGNOSIS — C3410 Malignant neoplasm of upper lobe, unspecified bronchus or lung: Secondary | ICD-10-CM

## 2010-07-06 ENCOUNTER — Ambulatory Visit (INDEPENDENT_AMBULATORY_CARE_PROVIDER_SITE_OTHER): Payer: Self-pay | Admitting: Thoracic Surgery

## 2010-07-06 DIAGNOSIS — C3490 Malignant neoplasm of unspecified part of unspecified bronchus or lung: Secondary | ICD-10-CM

## 2010-07-07 NOTE — Assessment & Plan Note (Signed)
OFFICE VISIT  KAYTIE, RATCLIFFE T DOB:  04/18/1960                                        July 06, 2010 CHART #:  [id number]  Ms. Toulouse turns today after having her surgery.  We repeated her labs and her hematocrit is up to 31, however, white count 17,000, and her platelet count 693,000.  Chest x-ray shows no evidence of any pneumonia but with a small left effusion which would go along after her left upper lobectomy and obviously elevation of her left diaphragm.  Her incision is healing well.  There is no erythema and hopefully over the next 2 weeks gradually the edema will subside.  I told her that she could start driving next week, not lifting over 10 pounds, and we would see her back again in 2 weeks with a chest x-ray.  Her blood pressure was 137/87, pulse 100, respirations 18, and sats were 94%.  Ines Bloomer, M.D. Electronically Signed  DPB/MEDQ  D:  07/06/2010  T:  07/07/2010  Job:  [id number]  cc:   Normand Sloop, MD

## 2010-07-12 NOTE — Op Note (Signed)
NAMEBRITTANNI, CARIKER NO.:  [id number]  MEDICAL RECORD NO.:  [id number]  LOCATION:  3305                         FACILITY:  [facility]  PHYSICIAN:  Sheliah Plane, MD    DATE OF BIRTH:  02-Jan-1961  DATE OF PROCEDURE:  06/25/2010 DATE OF DISCHARGE:  07/01/2010                              OPERATIVE REPORT     PREOPERATIVE DIAGNOSIS:  Pancoast tumor.  POSTOPERATIVE DIAGNOSIS:  Pancoast tumor.  PROCEDURE PERFORMED:  Transmanubrial anterolateral thoracotomy with chest wall resection including the first rib and left upper lobectomy for Pancoast tumor.  SURGEON:  Sheliah Plane, MD  FIRST ASSISTANT:  Ines Bloomer, MD  SECOND ASSISTANT:  Rowe Clack, PA-C  BRIEF HISTORY:  The patient is a 50 year old female who has a known non- small cell carcinoma of the lung involving the left apex somewhat situated anteriorly with neuropathic pain.  She had previous needle biopsy for pathologic diagnosis and the patient was initially treated with radiation therapy and then referred for consideration of chest wall resection.  Because of the position of the mass abutting the subclavian artery, axillary artery and brachial plexus and its somewhat anterior location, it was decided to use a transmanubrial approach.  Risks and options including nerve and arterial damage were discussed with the patient in detail prior to surgery.  DESCRIPTION OF PROCEDURE:  The patient underwent general endotracheal anesthesia without incident.  A double-lumen endotracheal tube was placed and position confirmed.  The patient was then placed with a partial lateral position with the left arm out laterally and a roll under the shoulder to allow both partial sternotomy and thoracotomy. The neck and chest was prepped and draped in a sterile manner. Initially, incision was made in the midline of the sternum and a sternal saw was used to divide the manubrium in the midline and then across  the second intercostal space opened the internal mammary artery and vein were divided.  With a rolltrack the manubrium with the attached clavicle was  elevated up slightly and the underlying tissue was dissected off the sternum and the clavicle to the clavicular fascia.  The subclavian artery was identified.  The dissection was carried along medial to lateral.  The tumor itself was identified and was adherent to the undersurface of the first rib.  There was a dissection plane between the tumor and the left subclavian artery.  The incision had been brought down along the anterior sternocleidal mastoid muscle in addition and this gave exposure to identify the jugular vein, the subclavian vein as it enters into the innominate vein.  This was all mobilized and encircled with vessel loops.  The origin of the left subclavian artery was identified and the takeoff of the thyroidal branches and vertebral were all identified.  Vessel loops were placed proximally and distally around the subclavian artery and further dissection along where the subclavian artery was in proximity to the tumor, there was no definite invasion into the artery and the artery was able to be dissected free and separated as was the vein.  Biopsies in this area were sent and were without evidence of tumor.  With the artery and the vein dissected free of the tumor,  the first rib was identified anteriorly was divided and then disarticulated posteriorly separating the tumor with the rib. There was not direct invasion into the second rib.  With the upper portion of this dissected free through the same incision this was extended anterolaterally.  The hilum of the left lung was identified. The inferior pulmonary ligament was divided to give freedom for the lower lobe to fill the space.  Approaching superiorly and anteriorly, the branches of the pulmonary artery to the upper lobe and to the lingula were individually identified and  divided with a vascular stapler.  The upper lobe pulmonary vein was then in a similar fashion divided.  This gave exposure of the upper lobe bronchus which was encircled and divided with stapler.  The specimen was then removed and submitted to pathology.  The bronchial stump was then tested by inflating the lower lung was without evidence of air leak.  Some small areas along the cut surface of the lung were leaking and were sutured with absorbable suture and ProGEL.  With the specimen removed, two 28 chest tubes were left in place, one anterior and one posterior, the lower lobe was inflated, the rolltrack was relaxed in the scapula and manubrium returned to its normal position.  Two double #6 stainless steel wires were placed through the body of the manubrium and the resected portion to reapproximate the manubrium and clavicle.  This was done with a small drill bit through the bone as was several intercostal sutures were placed in the ribs.  The muscle layers were then closed with interrupted 0 Vicryl, running 3-0 Vicryl in subcutaneous tissue and 3-0 subcuticular stitch in the skin edges.  Dry dressings were applied. The left lung inflated well and without air leak.  The patient was then awakened and extubated in the operating room and then transferred to the surgical intensive care unit for further postoperative care.  Estimated blood loss was approximately 200 mL.     Sheliah Plane, MD     EG/MEDQ  D:  07/05/2010  T:  07/06/2010  Job:  [id number]  cc:   Lajuana Matte, M.D.  Electronically Signed by Sheliah Plane MD on 07/12/2010 10:45:27 AM

## 2010-07-19 ENCOUNTER — Other Ambulatory Visit: Payer: Self-pay | Admitting: Thoracic Surgery

## 2010-07-19 DIAGNOSIS — C3410 Malignant neoplasm of upper lobe, unspecified bronchus or lung: Secondary | ICD-10-CM

## 2010-07-20 ENCOUNTER — Ambulatory Visit
Admission: RE | Admit: 2010-07-20 | Discharge: 2010-07-20 | Disposition: A | Discharge status: Home / Self Care | Payer: BC Managed Care – PPO | Source: Ambulatory Visit | Attending: Thoracic Surgery | Admitting: Thoracic Surgery

## 2010-07-20 ENCOUNTER — Ambulatory Visit (INDEPENDENT_AMBULATORY_CARE_PROVIDER_SITE_OTHER): Payer: Self-pay | Admitting: Thoracic Surgery

## 2010-07-20 ENCOUNTER — Ambulatory Visit: Payer: BC Managed Care – PPO | Admitting: Thoracic Surgery

## 2010-07-20 DIAGNOSIS — C3410 Malignant neoplasm of upper lobe, unspecified bronchus or lung: Secondary | ICD-10-CM

## 2010-07-21 NOTE — Assessment & Plan Note (Signed)
OFFICE VISIT  [name], [name] DOB:  [birth year]                                        July 20, 2010 CHART #:  [id number]  Ms. Mcneary returned today and she is doing well overall.  Her incision continues to heal and looks much better.  Her chest x-ray still shows a small right left pleural effusion which has gradually improved, but overall she is making good progress and we will see her back again in 4 weeks with a chest x-ray and hopefully her pain and her tiredness and shortness of breath hopefully will gradually improve.  Ines Bloomer, M.D. Electronically Signed  DPB/MEDQ  D:  07/20/2010  [name]:  07/21/2010  Job:  [id number]

## 2010-07-26 NOTE — Discharge Summary (Signed)
[name], [name]             ACCOUNT NO.:  [id number]  MEDICAL RECORD NO.:  [id number]  LOCATION:  3305                         FACILITY:  [facility]  PHYSICIAN:  Ines Bloomer, M.D. DATE OF BIRTH:  [birth year]  DATE OF ADMISSION:  06/25/2010 DATE OF DISCHARGE:  07/01/2010                              DISCHARGE SUMMARY   PRIMARY ADMITTING DIAGNOSIS:  Left upper lobe lung mass.  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 2. Hypertension. 3. Postoperative acute blood loss anemia. 4. Remote history of tobacco abuse. 5. History of depression. 6. Postoperative hypokalemia.  PROCEDURES PERFORMED:  Left upper lobectomy via transmanubrial approach.  HISTORY:  The patient is a 50 year old female, who presented initially in January 2012 with left neck and back pain.  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  She was initially seen by Dr. Edwyna Shell and ultimately did require radiation and chemotherapy.  Her most recent return to the office on May 16 showed a good response to chemotherapy and radiation with a decrease in the size of the lesion from 4.4 x 3.6 to 2.8 x 2.1 cm.  Her pain had resolved. An MRI was repeated, which showed probably some involvement of the T1 nerve root in first and second ribs.  Dr. Edwyna Shell felt at this time she would require surgical resection.  All risks, benefits, and alternatives were explained to the patient and she agreed to proceed.  HOSPITAL COURSE:  [name] was admitted to [facility] on the day of this admission and was taken to the operating room where she underwent a left upper lobectomy via transmanubrial approach.  She tolerated the procedure well, was transferred to the SICU in stable condition.  She remained stable initially postoperatively.  Chest  tubes were removed in the standard fashion.  She was able to be transferred to the step-down unit on postop day #3.  Overall, her postoperative course has been uneventful.  She has had a postoperative acute blood loss anemia, which has been asymptomatic and has not required transfusion. She has been treated with p.o. iron supplementation.  Also, she has had hypokalemia and her potassium has been repleted with both IV and p.o. potassium.  She is ambulating in the halls without difficulty.  She is tolerating a regular diet, is having normal bowel and bladder function. She has remained afebrile and her vital signs have been stable.  Her blood pressures have started to trend upward and at this point her home medications have been restarted.  She has been weaned from all supplemental oxygen and is maintaining sats of greater than 90% on room air.  Her incisions are all healing well.  She is ambulating in the halls without difficulty.  Her most recent labs on postop day #6 show sodium 140, potassium 2.9, which is currently being repleted, BUN 12, creatinine 0.47, hemoglobin 7.6, hematocrit 22.2, platelets 322, white count 8.7.  Chest x-ray is stable with a small left effusion.  Dr. Edwyna Shell has seen and evaluated the patient and at this time she is deemed ready for  discharge home.  DISCHARGE MEDICATIONS: 1. Nu-Iron 150 mg daily. 2. Potassium 20 mEq b.i.d. x3 days. 3. Hydrocodone/APAP 5/500 one to two q.4 h. p.r.n. for pain. 4. Lisinopril 20 mg daily. 5. Paxil 10 mg daily. 6. Xanax 0.25 mg daily p.r.n.  DISCHARGE INSTRUCTIONS:  She is asked to refrain from driving, heavy lifting, or strenuous activity.  She may continue ambulating daily and using her incentive spirometer.  She may shower daily and clean her incisions with soap and water.  She will continue a low-fat, low-sodium diet.  DISCHARGE FOLLOWUP:  She will need to return to the office on June 12 with a chest x-ray from [facility]  Imaging and CBC and a BMET from [employer].  She will also follow up as directed with Dr. Ubaldo Glassing.  In the interim, if she experiences any problems or have questions, she is asked to contact our office.     Coral Ceo, P.A.   ______________________________ Ines Bloomer, M.D.    GC/MEDQ  D:  07/01/2010  T:  07/02/2010  Job:  [id number]  cc:   Lebron Conners. Darovsky, M.D.  Electronically Signed by Weldon Inches. on 07/22/2010 12:47:41 PM Electronically Signed by Jovita Gamma M.D. on 07/26/2010 04:05:24 PM

## 2010-08-03 NOTE — Assessment & Plan Note (Signed)
OFFICE VISIT  [name], [name] DOB:  12/02/1960                                        Jun 23, 2010 CHART #:  [id number]  [name] came today for final discussion of her chest wall resection, first and second rib resection, left upper lobectomy.  We explained the risks of the surgery including Horner syndrome, subclavian artery resection, possible subclavian vein ligation, possible left arm weakness secondary to tumor involvement as well as either risks of infection, hemorrhage, myocardial infarction, pulmonary embolus.  She understood all these risks and agrees to the surgery.  Understands the gravity of the surgery.  Dr. Tyrone Sage and I will be co-surgeons on this.  She knew Dr. Tyrone Sage from when he operated on her sister in June 2005.  Ines Bloomer, M.D. Electronically Signed  DPB/MEDQ  D:  06/23/2010  T:  06/24/2010  Job:  [id number]

## 2010-08-16 ENCOUNTER — Other Ambulatory Visit: Payer: Self-pay | Admitting: Thoracic Surgery

## 2010-08-16 DIAGNOSIS — J90 Pleural effusion, not elsewhere classified: Secondary | ICD-10-CM

## 2010-08-17 ENCOUNTER — Ambulatory Visit (INDEPENDENT_AMBULATORY_CARE_PROVIDER_SITE_OTHER): Payer: Self-pay | Admitting: Thoracic Surgery

## 2010-08-17 ENCOUNTER — Ambulatory Visit
Admission: RE | Admit: 2010-08-17 | Discharge: 2010-08-17 | Disposition: A | Discharge status: Home / Self Care | Payer: BC Managed Care – PPO | Source: Ambulatory Visit | Attending: Thoracic Surgery | Admitting: Thoracic Surgery

## 2010-08-17 DIAGNOSIS — J90 Pleural effusion, not elsewhere classified: Secondary | ICD-10-CM

## 2010-08-17 DIAGNOSIS — C3490 Malignant neoplasm of unspecified part of unspecified bronchus or lung: Secondary | ICD-10-CM

## 2010-08-18 NOTE — Assessment & Plan Note (Signed)
OFFICE VISIT  [name], [name] DOB:  03-23-1960                                        August 17, 2010 CHART #:  [id number]  The patient came today.  Her blood pressure is 117/83, pulse 90, respirations 18, sats were 94%.  She is still having some moderate pain, but this is improving.  Her incisions are healing well.  Her chest x-ray showed further improvement as far as the effusion on the left side and better aeration.  Lungs are clear to auscultation and percussion.  I told her to gradually increase her activities.  We will see her again in 6 weeks with another chest x-ray and get a CT scan in approximately 3 months.  Ines Bloomer, M.D. Electronically Signed  DPB/MEDQ  D:  08/17/2010  [name]:  08/18/2010  Job:  [id number]  cc:   Normand Sloop, MD

## 2010-09-22 ENCOUNTER — Other Ambulatory Visit: Payer: Self-pay | Admitting: Thoracic Surgery

## 2010-09-22 DIAGNOSIS — C3410 Malignant neoplasm of upper lobe, unspecified bronchus or lung: Secondary | ICD-10-CM

## 2010-09-24 DIAGNOSIS — C3490 Malignant neoplasm of unspecified part of unspecified bronchus or lung: Secondary | ICD-10-CM | POA: Insufficient documentation

## 2010-09-28 ENCOUNTER — Ambulatory Visit
Admission: RE | Admit: 2010-09-28 | Discharge: 2010-09-28 | Disposition: A | Discharge status: Home / Self Care | Payer: BC Managed Care – PPO | Source: Ambulatory Visit | Attending: Thoracic Surgery | Admitting: Thoracic Surgery

## 2010-09-28 ENCOUNTER — Encounter: Payer: Self-pay | Admitting: Thoracic Surgery

## 2010-09-28 ENCOUNTER — Ambulatory Visit (INDEPENDENT_AMBULATORY_CARE_PROVIDER_SITE_OTHER): Payer: Self-pay | Admitting: Thoracic Surgery

## 2010-09-28 VITALS — BP 134/82 | HR 72 | Resp 16 | Ht 64.0 in | Wt 121.0 lb

## 2010-09-28 DIAGNOSIS — Z9889 Other specified postprocedural states: Secondary | ICD-10-CM

## 2010-09-28 DIAGNOSIS — C3410 Malignant neoplasm of upper lobe, unspecified bronchus or lung: Secondary | ICD-10-CM

## 2010-09-28 DIAGNOSIS — C3490 Malignant neoplasm of unspecified part of unspecified bronchus or lung: Secondary | ICD-10-CM

## 2010-09-28 DIAGNOSIS — Z902 Acquired absence of lung [part of]: Secondary | ICD-10-CM

## 2010-09-28 NOTE — Progress Notes (Signed)
HPI the patient returns now 3 months after having a resection   of the anterior superior sulcus tumor. Her incision is healing well. The Port-A-Cath was in good position. Her shoulder has limited range of motion with abduction of 90 . Physical therapy to increase the range of motion of her left shoulder. We will remove her Port-A-Cath after we check a CT scan to be sure there is no evidence of recurrence.  Current Outpatient Prescriptions  Medication Sig Dispense Refill  . ALPRAZolam (XANAX) 0.5 MG tablet Take 0.5 mg by mouth at bedtime as needed.        Marland Kitchen HYDROcodone-acetaminophen (VICODIN) 5-500 MG per tablet Take 1 tablet by mouth every 6 (six) hours as needed.        Marland Kitchen lisinopril (PRINIVIL,ZESTRIL) 40 MG tablet Take 40 mg by mouth daily.        Marland Kitchen PARoxetine (PAXIL) 20 MG tablet Take 20 mg by mouth every morning.        . zolpidem (AMBIEN) 10 MG tablet Take 10 mg by mouth at bedtime as needed. Take one half tablet prn           Physical Exam  Cardiovascular: Normal rate, regular rhythm and normal heart sounds.   Pulmonary/Chest: Effort normal and breath sounds normal. No respiratory distress.     Diagnostic tests: Chest x-ray shows normal postoperative changes.   Impression: status post left upper lobectomy with resection of superior sulcus tumor   Plan: Followup in 4 weeks

## 2010-10-04 ENCOUNTER — Ambulatory Visit: Payer: BC Managed Care – PPO | Attending: Thoracic Surgery | Admitting: Physical Therapy

## 2010-10-04 DIAGNOSIS — M25519 Pain in unspecified shoulder: Secondary | ICD-10-CM | POA: Insufficient documentation

## 2010-10-04 DIAGNOSIS — R5381 Other malaise: Secondary | ICD-10-CM | POA: Insufficient documentation

## 2010-10-04 DIAGNOSIS — IMO0001 Reserved for inherently not codable concepts without codable children: Secondary | ICD-10-CM | POA: Insufficient documentation

## 2010-10-04 DIAGNOSIS — M25619 Stiffness of unspecified shoulder, not elsewhere classified: Secondary | ICD-10-CM | POA: Insufficient documentation

## 2010-10-06 ENCOUNTER — Ambulatory Visit: Payer: BC Managed Care – PPO | Admitting: Physical Therapy

## 2010-10-11 ENCOUNTER — Encounter: Payer: BC Managed Care – PPO | Admitting: Physical Therapy

## 2010-10-13 ENCOUNTER — Ambulatory Visit: Payer: BC Managed Care – PPO | Admitting: Physical Therapy

## 2010-10-18 ENCOUNTER — Ambulatory Visit: Payer: BC Managed Care – PPO | Admitting: Physical Therapy

## 2010-10-20 ENCOUNTER — Ambulatory Visit: Payer: BC Managed Care – PPO | Admitting: Physical Therapy

## 2010-10-22 DIAGNOSIS — I10 Essential (primary) hypertension: Secondary | ICD-10-CM | POA: Insufficient documentation

## 2010-10-22 DIAGNOSIS — F419 Anxiety disorder, unspecified: Secondary | ICD-10-CM

## 2010-10-25 ENCOUNTER — Ambulatory Visit: Payer: BC Managed Care – PPO | Attending: Thoracic Surgery | Admitting: Physical Therapy

## 2010-10-25 DIAGNOSIS — R5381 Other malaise: Secondary | ICD-10-CM | POA: Insufficient documentation

## 2010-10-25 DIAGNOSIS — IMO0001 Reserved for inherently not codable concepts without codable children: Secondary | ICD-10-CM | POA: Insufficient documentation

## 2010-10-25 DIAGNOSIS — M25519 Pain in unspecified shoulder: Secondary | ICD-10-CM | POA: Insufficient documentation

## 2010-10-25 DIAGNOSIS — M25619 Stiffness of unspecified shoulder, not elsewhere classified: Secondary | ICD-10-CM | POA: Insufficient documentation

## 2010-10-27 ENCOUNTER — Encounter: Payer: Self-pay | Admitting: Thoracic Surgery

## 2010-10-27 ENCOUNTER — Ambulatory Visit
Admission: RE | Admit: 2010-10-27 | Discharge: 2010-10-27 | Disposition: A | Discharge status: Home / Self Care | Payer: BC Managed Care – PPO | Source: Ambulatory Visit | Attending: Thoracic Surgery | Admitting: Thoracic Surgery

## 2010-10-27 ENCOUNTER — Ambulatory Visit (INDEPENDENT_AMBULATORY_CARE_PROVIDER_SITE_OTHER): Payer: BC Managed Care – PPO | Admitting: Thoracic Surgery

## 2010-10-27 ENCOUNTER — Encounter: Payer: BC Managed Care – PPO | Admitting: Physical Therapy

## 2010-10-27 VITALS — BP 114/78 | HR 76 | Resp 18 | Ht 64.0 in | Wt 125.0 lb

## 2010-10-27 DIAGNOSIS — Z9889 Other specified postprocedural states: Secondary | ICD-10-CM

## 2010-10-27 DIAGNOSIS — C3490 Malignant neoplasm of unspecified part of unspecified bronchus or lung: Secondary | ICD-10-CM

## 2010-10-27 DIAGNOSIS — Z902 Acquired absence of lung [part of]: Secondary | ICD-10-CM

## 2010-10-27 NOTE — Progress Notes (Signed)
HPI patient returns today for a CT scan. CT scan shows no evidence of recurrence of her cancer there is a left pleural effusion. This  effusion is stable. We will remove her Port-A-Cath on October 9. She is doing well overall.   Current Outpatient Prescriptions  Medication Sig Dispense Refill  . ALPRAZolam (XANAX) 0.5 MG tablet Take 0.5 mg by mouth at bedtime as needed.        . fentaNYL (DURAGESIC - DOSED MCG/HR) 25 MCG/HR Place 1 patch onto the skin every 3 (three) days.       Marland Kitchen lisinopril (PRINIVIL,ZESTRIL) 40 MG tablet Take 40 mg by mouth daily.        Marland Kitchen PARoxetine (PAXIL) 20 MG tablet Take 20 mg by mouth every morning.        . zolpidem (AMBIEN) 10 MG tablet Take 10 mg by mouth at bedtime as needed. Take one half tablet prn          Review of Systems: Unchanged   Physical Exam  Cardiovascular: Normal rate, regular rhythm and normal heart sounds.   Pulmonary/Chest: Effort normal and breath sounds normal.     Diagnostic Tests: CT scan shows no evidence of recurrence of her cancer. There is a moderate left pleural effusion.   Impression: Status post resection of left upper lobe superior sulcus tumor   Plan: Removal of Port-A-Cath.

## 2010-10-29 ENCOUNTER — Encounter: Payer: Self-pay | Admitting: *Deleted

## 2010-11-01 ENCOUNTER — Other Ambulatory Visit: Payer: Self-pay | Admitting: Thoracic Surgery

## 2010-11-01 ENCOUNTER — Encounter (HOSPITAL_COMMUNITY)
Admission: RE | Admit: 2010-11-01 | Discharge: 2010-11-01 | Disposition: A | Discharge status: Home / Self Care | Payer: BC Managed Care – PPO | Source: Ambulatory Visit | Attending: Thoracic Surgery | Admitting: Thoracic Surgery

## 2010-11-01 ENCOUNTER — Ambulatory Visit (HOSPITAL_COMMUNITY)
Admission: RE | Admit: 2010-11-01 | Discharge: 2010-11-01 | Disposition: A | Discharge status: Home / Self Care | Payer: BC Managed Care – PPO | Source: Ambulatory Visit | Attending: Thoracic Surgery | Admitting: Thoracic Surgery

## 2010-11-01 DIAGNOSIS — Z01812 Encounter for preprocedural laboratory examination: Secondary | ICD-10-CM | POA: Insufficient documentation

## 2010-11-01 DIAGNOSIS — C3490 Malignant neoplasm of unspecified part of unspecified bronchus or lung: Secondary | ICD-10-CM

## 2010-11-01 DIAGNOSIS — Z01818 Encounter for other preprocedural examination: Secondary | ICD-10-CM | POA: Insufficient documentation

## 2010-11-01 LAB — CBC
HCT: 36.7 % (ref 36.0–46.0)
Hemoglobin: 11.9 g/dL — ABNORMAL LOW (ref 12.0–15.0)
MCH: 28.9 pg (ref 26.0–34.0)
MCHC: 32.4 g/dL (ref 30.0–36.0)
MCV: 89.1 fL (ref 78.0–100.0)
RDW: 14 % (ref 11.5–15.5)

## 2010-11-01 LAB — SURGICAL PCR SCREEN: MRSA, PCR: NEGATIVE

## 2010-11-01 LAB — COMPREHENSIVE METABOLIC PANEL
Albumin: 3.9 g/dL (ref 3.5–5.2)
Alkaline Phosphatase: 152 U/L — ABNORMAL HIGH (ref 39–117)
BUN: 30 mg/dL — ABNORMAL HIGH (ref 6–23)
CO2: 31 mEq/L (ref 19–32)
Chloride: 101 mEq/L (ref 96–112)
Creatinine, Ser: 0.64 mg/dL (ref 0.50–1.10)
GFR calc non Af Amer: >90 mL/min (ref >90)
Glucose, Bld: 88 mg/dL (ref 70–99)
Potassium: 4.2 mEq/L (ref 3.5–5.1)
Total Bilirubin: 0.2 mg/dL — ABNORMAL LOW (ref 0.3–1.2)

## 2010-11-01 LAB — PROTIME-INR: INR: 1.01 (ref 0.00–1.49)

## 2010-11-02 ENCOUNTER — Ambulatory Visit (HOSPITAL_COMMUNITY)
Admission: RE | Admit: 2010-11-02 | Discharge: 2010-11-02 | Disposition: A | Discharge status: Home / Self Care | Payer: BC Managed Care – PPO | Source: Ambulatory Visit | Attending: Thoracic Surgery | Admitting: Thoracic Surgery

## 2010-11-02 ENCOUNTER — Ambulatory Visit (HOSPITAL_COMMUNITY): Payer: BC Managed Care – PPO

## 2010-11-02 DIAGNOSIS — K219 Gastro-esophageal reflux disease without esophagitis: Secondary | ICD-10-CM | POA: Insufficient documentation

## 2010-11-02 DIAGNOSIS — Z01812 Encounter for preprocedural laboratory examination: Secondary | ICD-10-CM | POA: Insufficient documentation

## 2010-11-02 DIAGNOSIS — C3490 Malignant neoplasm of unspecified part of unspecified bronchus or lung: Secondary | ICD-10-CM

## 2010-11-02 DIAGNOSIS — Z452 Encounter for adjustment and management of vascular access device: Secondary | ICD-10-CM | POA: Insufficient documentation

## 2010-11-02 DIAGNOSIS — Z01818 Encounter for other preprocedural examination: Secondary | ICD-10-CM | POA: Insufficient documentation

## 2010-11-09 ENCOUNTER — Encounter: Payer: BC Managed Care – PPO | Admitting: Physical Therapy

## 2010-11-10 ENCOUNTER — Encounter: Payer: Self-pay | Admitting: Thoracic Surgery

## 2010-11-10 ENCOUNTER — Ambulatory Visit (INDEPENDENT_AMBULATORY_CARE_PROVIDER_SITE_OTHER): Payer: BC Managed Care – PPO | Admitting: Thoracic Surgery

## 2010-11-10 VITALS — BP 125/84 | HR 66 | Resp 16 | Ht 64.0 in | Wt 125.0 lb

## 2010-11-10 DIAGNOSIS — C3490 Malignant neoplasm of unspecified part of unspecified bronchus or lung: Secondary | ICD-10-CM

## 2010-11-10 NOTE — Progress Notes (Signed)
HPI the patient returns for followup. The Port-A-Cath site is well-healed. She is doing well overall. We will see her back again in 3 months with a CT scan.  Current Outpatient Prescriptions  Medication Sig Dispense Refill  . ALPRAZolam (XANAX) 0.5 MG tablet Take 0.5 mg by mouth at bedtime as needed.        . fentaNYL (DURAGESIC - DOSED MCG/HR) 25 MCG/HR Place 1 patch onto the skin every 3 (three) days.       Marland Kitchen HYDROcodone-acetaminophen (NORCO) 5-325 MG per tablet Take 1 tablet by mouth every 4 (four) hours as needed.        Marland Kitchen lisinopril (PRINIVIL,ZESTRIL) 40 MG tablet Take 40 mg by mouth daily.        Marland Kitchen PARoxetine (PAXIL) 20 MG tablet Take 20 mg by mouth every morning.        . zolpidem (AMBIEN) 10 MG tablet Take 10 mg by mouth at bedtime as needed. Take one half tablet prn          Review of Systems: No change   Physical Exam  Cardiovascular: Normal rate, regular rhythm and normal heart sounds.   Pulmonary/Chest: Breath sounds normal. No respiratory distress.   incision is well healed   Diagnostic Tests:   Impression: Status post Port-A-Cath removal stage IIb anterior Pancoast tumor   Plan: Return in 3 months with a CT scan

## 2010-11-11 ENCOUNTER — Ambulatory Visit: Payer: BC Managed Care – PPO | Admitting: *Deleted

## 2010-11-16 ENCOUNTER — Ambulatory Visit: Payer: BC Managed Care – PPO | Admitting: Physical Therapy

## 2010-11-18 ENCOUNTER — Ambulatory Visit: Payer: BC Managed Care – PPO | Admitting: *Deleted

## 2010-11-23 ENCOUNTER — Ambulatory Visit: Payer: BC Managed Care – PPO | Admitting: *Deleted

## 2010-11-24 NOTE — Op Note (Addendum)
  [name], [name]             ACCOUNT NO.:  [id number]  MEDICAL RECORD NO.:  [id number]  LOCATION:  [facility]                         FACILITY:  [facility]  PHYSICIAN:  Ines Bloomer, M.D. DATE OF BIRTH:  1960-11-22  DATE OF PROCEDURE:  11/03/2010 DATE OF DISCHARGE:  11/11/2010                              OPERATIVE REPORT   PREOPERATIVE DIAGNOSIS:  Status post resection of a anterior Pancoast tumor, status post chemotherapy.  POSTOPERATIVE DIAGNOSIS:  Status post resection of a anterior Pancoast tumor, status post chemotherapy.  OPERATION PERFORMED:  Removal of Port-A-Cath.  SURGEON:  Ines Bloomer, MD  ANESTHESIA:  1% Xylocaine and IV sedation.  DESCRIPTION OF PROCEDURE:  After prepping and draping the right chest, an area was infiltrated with 1% Xylocaine.  The previous incision was infiltrated with 1% Xylocaine and the incision was opened and dissection was carried down to the tubing and the tubing was removed from the right IJ and then we cut the capsule around the Port-A-Cath and removed the Port-A-Cath.  We closed the wound with 3-0 Vicryl in subcutaneous tissue and 3-0 Vicryl in a subcuticular stitch and Dermabond for the skin.  The patient tolerated the procedure well.     Ines Bloomer, M.D.     DPB/MEDQ  D:  11/15/2010  T:  11/15/2010  Job:  [id number]  Electronically Signed by Jovita Gamma M.D. on 11/24/2010 04:59:37 PM

## 2010-11-25 ENCOUNTER — Ambulatory Visit: Payer: BC Managed Care – PPO | Attending: Thoracic Surgery | Admitting: Physical Therapy

## 2010-11-25 DIAGNOSIS — IMO0001 Reserved for inherently not codable concepts without codable children: Secondary | ICD-10-CM | POA: Insufficient documentation

## 2010-11-25 DIAGNOSIS — R5381 Other malaise: Secondary | ICD-10-CM | POA: Insufficient documentation

## 2010-11-25 DIAGNOSIS — M25619 Stiffness of unspecified shoulder, not elsewhere classified: Secondary | ICD-10-CM | POA: Insufficient documentation

## 2010-11-25 DIAGNOSIS — M25519 Pain in unspecified shoulder: Secondary | ICD-10-CM | POA: Insufficient documentation

## 2010-11-25 HISTORY — PX: COLONOSCOPY: SHX174

## 2010-11-30 ENCOUNTER — Ambulatory Visit: Payer: BC Managed Care – PPO | Admitting: *Deleted

## 2010-12-02 ENCOUNTER — Ambulatory Visit: Payer: BC Managed Care – PPO | Admitting: *Deleted

## 2010-12-07 ENCOUNTER — Encounter: Payer: BC Managed Care – PPO | Admitting: *Deleted

## 2010-12-08 ENCOUNTER — Ambulatory Visit: Payer: BC Managed Care – PPO | Admitting: Physical Therapy

## 2010-12-09 ENCOUNTER — Encounter: Payer: BC Managed Care – PPO | Admitting: *Deleted

## 2010-12-13 ENCOUNTER — Encounter: Payer: BC Managed Care – PPO | Admitting: Physical Therapy

## 2010-12-15 ENCOUNTER — Encounter: Payer: BC Managed Care – PPO | Admitting: Physical Therapy

## 2010-12-25 HISTORY — PX: COLONOSCOPY: SHX174

## 2011-01-03 ENCOUNTER — Other Ambulatory Visit: Payer: Self-pay | Admitting: Thoracic Surgery

## 2011-01-03 DIAGNOSIS — C3490 Malignant neoplasm of unspecified part of unspecified bronchus or lung: Secondary | ICD-10-CM

## 2011-01-24 ENCOUNTER — Encounter: Payer: Self-pay | Admitting: Thoracic Surgery

## 2011-01-24 ENCOUNTER — Ambulatory Visit (INDEPENDENT_AMBULATORY_CARE_PROVIDER_SITE_OTHER): Payer: BC Managed Care – PPO | Admitting: Thoracic Surgery

## 2011-01-24 ENCOUNTER — Ambulatory Visit
Admission: RE | Admit: 2011-01-24 | Discharge: 2011-01-24 | Disposition: A | Discharge status: Home / Self Care | Payer: BC Managed Care – PPO | Source: Ambulatory Visit | Attending: Thoracic Surgery | Admitting: Thoracic Surgery

## 2011-01-24 VITALS — BP 131/74 | HR 65 | Resp 20 | Ht 64.0 in | Wt 125.0 lb

## 2011-01-24 DIAGNOSIS — C3490 Malignant neoplasm of unspecified part of unspecified bronchus or lung: Secondary | ICD-10-CM

## 2011-01-24 DIAGNOSIS — C761 Malignant neoplasm of thorax: Secondary | ICD-10-CM

## 2011-01-26 ENCOUNTER — Encounter: Payer: Self-pay | Admitting: Thoracic Surgery

## 2011-01-26 NOTE — Progress Notes (Signed)
HPI CT scan showed no evidence of recurrence. She is doing well overall. Her her incision is well-healed we'll see her again in 4 months with another CT scan  Current Outpatient Prescriptions  Medication Sig Dispense Refill  . ALPRAZolam (XANAX) 0.5 MG tablet Take 0.5 mg by mouth at bedtime as needed.        . fentaNYL (DURAGESIC - DOSED MCG/HR) 50 MCG/HR Place 1 patch onto the skin every 3 (three) days.        Marland Kitchen lisinopril (PRINIVIL,ZESTRIL) 40 MG tablet Take 20 mg by mouth daily.       Marland Kitchen PARoxetine (PAXIL) 20 MG tablet Take 20 mg by mouth every morning.           Review of Systems: Unchanged   Physical Exam lungs were clear to auscultation percussion   Diagnostic Tests: CT scan was negative   Impression: Status post resection of anterior superior sulcus tumor non-small cell lung cancer  Plan: Return in 5 months with CT scan

## 2011-02-09 ENCOUNTER — Ambulatory Visit: Payer: BC Managed Care – PPO | Admitting: Thoracic Surgery

## 2011-03-30 ENCOUNTER — Other Ambulatory Visit: Payer: Self-pay | Admitting: Thoracic Surgery

## 2011-03-30 DIAGNOSIS — C3490 Malignant neoplasm of unspecified part of unspecified bronchus or lung: Secondary | ICD-10-CM

## 2011-05-03 ENCOUNTER — Other Ambulatory Visit: Payer: Self-pay | Admitting: Thoracic Surgery

## 2011-05-03 DIAGNOSIS — C3490 Malignant neoplasm of unspecified part of unspecified bronchus or lung: Secondary | ICD-10-CM

## 2011-05-04 ENCOUNTER — Ambulatory Visit (HOSPITAL_COMMUNITY)
Admission: RE | Admit: 2011-05-04 | Discharge: 2011-05-04 | Disposition: A | Discharge status: Home / Self Care | Payer: BC Managed Care – PPO | Source: Ambulatory Visit | Attending: Thoracic Surgery | Admitting: Thoracic Surgery

## 2011-05-04 ENCOUNTER — Other Ambulatory Visit: Payer: BC Managed Care – PPO

## 2011-05-04 ENCOUNTER — Ambulatory Visit (INDEPENDENT_AMBULATORY_CARE_PROVIDER_SITE_OTHER): Payer: BC Managed Care – PPO | Admitting: Thoracic Surgery

## 2011-05-04 ENCOUNTER — Encounter (HOSPITAL_COMMUNITY): Payer: Self-pay

## 2011-05-04 ENCOUNTER — Encounter: Payer: Self-pay | Admitting: Thoracic Surgery

## 2011-05-04 VITALS — BP 130/83 | HR 90 | Resp 18 | Ht 64.0 in | Wt 140.0 lb

## 2011-05-04 DIAGNOSIS — Z923 Personal history of irradiation: Secondary | ICD-10-CM | POA: Insufficient documentation

## 2011-05-04 DIAGNOSIS — C3490 Malignant neoplasm of unspecified part of unspecified bronchus or lung: Secondary | ICD-10-CM

## 2011-05-04 DIAGNOSIS — IMO0002 Reserved for concepts with insufficient information to code with codable children: Secondary | ICD-10-CM | POA: Insufficient documentation

## 2011-05-04 DIAGNOSIS — Z902 Acquired absence of lung [part of]: Secondary | ICD-10-CM | POA: Insufficient documentation

## 2011-05-04 DIAGNOSIS — Y849 Medical procedure, unspecified as the cause of abnormal reaction of the patient, or of later complication, without mention of misadventure at the time of the procedure: Secondary | ICD-10-CM | POA: Insufficient documentation

## 2011-05-04 DIAGNOSIS — Z09 Encounter for follow-up examination after completed treatment for conditions other than malignant neoplasm: Secondary | ICD-10-CM | POA: Insufficient documentation

## 2011-05-04 NOTE — Progress Notes (Signed)
HPI patient returns for followup. CT scan showed no evidence for recurrence of her cancer. We plan to get another CT scan in 4 months since she'll see Dr. Tyrone Sage at that time. Incisions are well-healed she still having some mild chest wall pain.   Current Outpatient Prescriptions  Medication Sig Dispense Refill  . ALPRAZolam (XANAX) 0.5 MG tablet Take 0.5 mg by mouth at bedtime as needed.        . fentaNYL (DURAGESIC - DOSED MCG/HR) 50 MCG/HR Place 1 patch onto the skin every 3 (three) days.       Marland Kitchen lisinopril (PRINIVIL,ZESTRIL) 40 MG tablet Take 20 mg by mouth daily.       Marland Kitchen PARoxetine (PAXIL) 20 MG tablet Take 20 mg by mouth every morning.           Review of Systems: Unchanged still has a lot of anxiety   Physical Exam incisions are well-healed lungs are clear attestation percussion   Diagnostic Tests: CT scan of the chest shows no evidence for recurrence of her left anterior Pancoast tumor   Impression: Status post left upper lobectomy for anterior Pancoast tumor with chest wall resection   Plan: Return in 4 months to see Dr. Tyrone Sage

## 2011-08-08 ENCOUNTER — Other Ambulatory Visit: Payer: Self-pay | Admitting: Cardiothoracic Surgery

## 2011-08-08 DIAGNOSIS — C3490 Malignant neoplasm of unspecified part of unspecified bronchus or lung: Secondary | ICD-10-CM

## 2011-09-15 ENCOUNTER — Encounter: Payer: Self-pay | Admitting: Cardiothoracic Surgery

## 2011-09-15 ENCOUNTER — Ambulatory Visit (INDEPENDENT_AMBULATORY_CARE_PROVIDER_SITE_OTHER): Payer: BC Managed Care – PPO | Admitting: Cardiothoracic Surgery

## 2011-09-15 ENCOUNTER — Ambulatory Visit
Admission: RE | Admit: 2011-09-15 | Discharge: 2011-09-15 | Disposition: A | Discharge status: Home / Self Care | Payer: BC Managed Care – PPO | Source: Ambulatory Visit | Attending: Cardiothoracic Surgery | Admitting: Cardiothoracic Surgery

## 2011-09-15 VITALS — BP 140/97 | HR 71 | Resp 18 | Ht 64.0 in | Wt 145.0 lb

## 2011-09-15 DIAGNOSIS — Z09 Encounter for follow-up examination after completed treatment for conditions other than malignant neoplasm: Secondary | ICD-10-CM

## 2011-09-15 DIAGNOSIS — C3490 Malignant neoplasm of unspecified part of unspecified bronchus or lung: Secondary | ICD-10-CM

## 2011-09-15 DIAGNOSIS — C3410 Malignant neoplasm of upper lobe, unspecified bronchus or lung: Secondary | ICD-10-CM

## 2011-09-15 NOTE — Progress Notes (Signed)
[address]            [place] [address]          [phone]       [facility] Medical Record #[id number] Date of Birth: 05-03-1960  [name] Bloomer, MD Samuel Jester, DO  Chief Complaint:   PostOp Follow Up Visit 06/25/2010    OPERATIVE REPORT  PREOPERATIVE DIAGNOSIS: Pancoast tumor.  POSTOPERATIVE DIAGNOSIS: Pancoast tumor.  PROCEDURE PERFORMED: Transmanubrial anterolateral thoracotomy with  chest wall resection including the first rib and left upper lobectomy  for Pancoast tumor.  ypTX , pN0   History of Present Illness:      Patient returns for followup visit today now little over a year after resection of Pancoast tumor through a trans-manubrial anterolateral thoracotomy with chest wall resection. She's done well postoperatively notes that she has full range of motion and function of her left arm. Since diagnosed with lung cancer she has not been smoking. She comes in today with a followup CT scan arranged by Dr. Edwyna Shell on her previous visit.         History  Smoking status  . Former Smoker  . Quit date: 02/04/2010  Smokeless tobacco  . Never Used       No Known Allergies  Current Outpatient Prescriptions  Medication Sig Dispense Refill  . ALPRAZolam (XANAX) 0.5 MG tablet Take 0.5 mg by mouth at bedtime as needed.        . cyclobenzaprine (FLEXERIL) 10 MG tablet Take 10 mg by mouth 2 (two) times daily as needed.       . hydrochlorothiazide (HYDRODIURIL) 25 MG tablet Take 25 mg by mouth daily.       Marland Kitchen HYDROcodone-acetaminophen (NORCO) 10-325 MG per tablet 1 tablet every 6 (six) hours as needed.       Marland Kitchen PARoxetine (PAXIL) 20 MG tablet Take 20 mg by mouth every morning.             Physical Exam: BP 140/97  Pulse 71  Resp 18  Ht 5\' 4"  (1.626 m)  Wt 145 lb (65.772 kg)  BMI 24.89 kg/m2  SpO2 96%  General appearance: alert and cooperative Neurologic: intact Heart: regular rate and  rhythm, S1, S2 normal, no murmur, click, rub or gallop and normal apical impulse Lungs: clear to auscultation bilaterally Wound: Her left chest incision is well-healed she has full range of motion of her left shoulder I do not appreciate any cervical supraclavicular or axillary adenopathy   Diagnostic Studies & Laboratory data:         Recent Radiology Findings: Ct Chest Wo Contrast  09/15/2011  *RADIOLOGY REPORT*  Clinical Data: Lung cancer  CT CHEST WITHOUT CONTRAST  Technique:  Multidetector CT imaging of the chest was performed following the standard protocol without IV contrast.  Comparison: 05/04/2011  Findings:  No enlarged axillary or supraclavicular lymph nodes.  There are no enlarged mediastinal or hilar lymph nodes.  No pericardial or pleural effusion.  Postoperative change and volume loss is noted affecting the left hemithorax. Mild to moderate changes of emphysema identified.  No pulmonary nodule or mass identified.  There are no specific features to suggest residual or recurrence of tumor.  Review of the visualized osseous structures shows resection of the left first rib.  No aggressive lytic or sclerotic bone lesions identified.  IMPRESSION:  1.  No acute findings.  No evidence for lung cancer recurrence.   Original Report Authenticated By: Rosealee Albee, M.D.       Recent Labs: Lab Results  Component Value Date   WBC 7.4 11/01/2010   HGB 11.9* 11/01/2010   HCT 36.7 11/01/2010   PLT 359 11/01/2010   GLUCOSE 88 11/01/2010   ALT 28 11/01/2010   AST 24 11/01/2010   NA 141 11/01/2010   K 4.2 11/01/2010   CL 101 11/01/2010   CREATININE 0.64 11/01/2010   BUN 30* 11/01/2010   CO2 31 11/01/2010   INR 1.01 11/01/2010      Assessment / Plan:     Patient is 14 months following transmural chest wall resection and left upper lobectomy for moderately differentiated adenocarcinoma/Pancoast tumor  ypTX , pN0  By Physical exam and CT there is no evidence of recurrent disease. She will  return in December for followup visit and CT scan. At that point we will increase the interval of scans if appropriate.      Delight Ovens MD 09/15/2011 1:26 PM

## 2011-10-24 ENCOUNTER — Encounter: Payer: BC Managed Care – PPO | Admitting: Internal Medicine

## 2011-10-24 DIAGNOSIS — M549 Dorsalgia, unspecified: Secondary | ICD-10-CM

## 2011-10-24 DIAGNOSIS — C3490 Malignant neoplasm of unspecified part of unspecified bronchus or lung: Secondary | ICD-10-CM

## 2011-10-31 ENCOUNTER — Encounter: Payer: BC Managed Care – PPO | Admitting: Internal Medicine

## 2011-12-05 ENCOUNTER — Other Ambulatory Visit: Payer: Self-pay | Admitting: Cardiothoracic Surgery

## 2011-12-05 DIAGNOSIS — C3490 Malignant neoplasm of unspecified part of unspecified bronchus or lung: Secondary | ICD-10-CM

## 2011-12-06 ENCOUNTER — Ambulatory Visit: Payer: BC Managed Care – PPO | Attending: Anesthesiology | Admitting: Physical Therapy

## 2011-12-06 DIAGNOSIS — R5381 Other malaise: Secondary | ICD-10-CM | POA: Insufficient documentation

## 2011-12-06 DIAGNOSIS — M545 Low back pain, unspecified: Secondary | ICD-10-CM | POA: Insufficient documentation

## 2011-12-06 DIAGNOSIS — IMO0001 Reserved for inherently not codable concepts without codable children: Secondary | ICD-10-CM | POA: Insufficient documentation

## 2011-12-08 ENCOUNTER — Ambulatory Visit: Payer: BC Managed Care – PPO | Admitting: Physical Therapy

## 2011-12-13 ENCOUNTER — Encounter: Payer: BC Managed Care – PPO | Admitting: Physical Therapy

## 2011-12-14 ENCOUNTER — Encounter: Payer: BC Managed Care – PPO | Admitting: Physical Therapy

## 2011-12-15 ENCOUNTER — Encounter: Payer: BC Managed Care – PPO | Admitting: Physical Therapy

## 2012-01-12 ENCOUNTER — Ambulatory Visit
Admission: RE | Admit: 2012-01-12 | Discharge: 2012-01-12 | Disposition: A | Discharge status: Home / Self Care | Payer: BC Managed Care – PPO | Source: Ambulatory Visit | Attending: Cardiothoracic Surgery | Admitting: Cardiothoracic Surgery

## 2012-01-12 ENCOUNTER — Encounter: Payer: BC Managed Care – PPO | Admitting: Cardiothoracic Surgery

## 2012-01-12 DIAGNOSIS — C3490 Malignant neoplasm of unspecified part of unspecified bronchus or lung: Secondary | ICD-10-CM

## 2012-01-13 ENCOUNTER — Ambulatory Visit (INDEPENDENT_AMBULATORY_CARE_PROVIDER_SITE_OTHER): Payer: BC Managed Care – PPO | Admitting: Cardiothoracic Surgery

## 2012-01-13 ENCOUNTER — Encounter: Payer: Self-pay | Admitting: Cardiothoracic Surgery

## 2012-01-13 ENCOUNTER — Other Ambulatory Visit: Payer: Self-pay | Admitting: *Deleted

## 2012-01-13 VITALS — BP 138/84 | HR 77 | Resp 16 | Ht 64.0 in | Wt 145.0 lb

## 2012-01-13 DIAGNOSIS — Z9889 Other specified postprocedural states: Secondary | ICD-10-CM

## 2012-01-13 DIAGNOSIS — C3410 Malignant neoplasm of upper lobe, unspecified bronchus or lung: Secondary | ICD-10-CM

## 2012-01-13 DIAGNOSIS — T8189XA Other complications of procedures, not elsewhere classified, initial encounter: Secondary | ICD-10-CM

## 2012-01-13 DIAGNOSIS — Z902 Acquired absence of lung [part of]: Secondary | ICD-10-CM

## 2012-01-13 NOTE — Progress Notes (Signed)
[address]            [place] [address]          [phone]          [name] Medical Record #[id number] Date of Birth: August 20, 1960  Samuel Jester, DO Samuel Jester, DO  Chief Complaint:   PostOp Follow Up Visit 06/25/2010    OPERATIVE REPORT  PREOPERATIVE DIAGNOSIS: Pancoast tumor.  POSTOPERATIVE DIAGNOSIS: Pancoast tumor.  PROCEDURE PERFORMED: Transmanubrial anterolateral thoracotomy with  chest wall resection including the first rib and left upper lobectomy  for Pancoast tumor.  ypTX , pN0   History of Present Illness:      Patient returns for followup visit today now little over a year after resection of Pancoast tumor through a trans-manubrial anterolateral thoracotomy with chest wall resection. She's done well postoperatively notes that she has full range of motion and function of her left arm. Since diagnosed with lung cancer she has not been smoking.  Past several months she has noticed a painful sternal wire.        History  Smoking status  . Former Smoker  . Quit date: 02/04/2010  Smokeless tobacco  . Never Used       No Known Allergies  Current Outpatient Prescriptions  Medication Sig Dispense Refill  . ALPRAZolam (XANAX) 0.5 MG tablet Take 0.5 mg by mouth at bedtime as needed.        . ARIPiprazole (ABILIFY) 2 MG tablet Take 2 mg by mouth daily.      Marland Kitchen HYDROcodone-acetaminophen (NORCO) 10-325 MG per tablet 1 tablet every 6 (six) hours as needed.       Marland Kitchen lisinopril (PRINIVIL,ZESTRIL) 40 MG tablet Take 20 mg by mouth daily.      Marland Kitchen PARoxetine (PAXIL) 20 MG tablet Take 20 mg by mouth every morning.             Physical Exam: BP 138/84  Pulse 77  Resp 16  Ht 5\' 4"  (1.626 m)  Wt 145 lb (65.772 kg)  BMI 24.89 kg/m2  SpO2 98%  General appearance: alert and cooperative Neurologic: intact Heart: regular rate and rhythm, S1, S2 normal, no murmur, click, rub or  gallop and normal apical impulse Lungs: clear to auscultation bilaterally Wound: Her left chest incision is well-healed she has full range of motion of her left shoulder I do not appreciate any cervical supraclavicular or axillary adenopathy The lower sternal wire placed at the time of her partial sternotomy now is close to eroding through the skin and is painful to touch.  Diagnostic Studies & Laboratory data:         Recent Radiology Findings: Ct Chest Wo Contrast  01/12/2012  *RADIOLOGY REPORT*  Clinical Data: Left lung cancer.  CT CHEST WITHOUT CONTRAST  Technique:  Multidetector CT imaging of the chest was performed following the standard protocol without IV contrast.  Comparison: Chest CT 09/15/2011.  Findings: The chest wall is unremarkable and stable.  No breast masses, supraclavicular or axillary lymphadenopathy.  The bony thorax is intact.  Stable surgical changes from a median sternotomy and stable left apical surgical changes.  No destructive bone lesions or spinal canal compromise.  The heart is normal in size.  No pericardial effusion.  No mediastinal or  hilar mass or lymphadenopathy.  The aorta is normal in caliber.  The esophagus is grossly normal.  Examination of the lung parenchyma demonstrates stable underlying emphysematous changes. Left apical scarring changes but no CT findings for recurrent tumor or pulmonary metastatic disease.  No pleural effusions.  The upper abdomen is unremarkable.  IMPRESSION: Stable CT appearance of the chest.  No findings for residual/recurrent tumor or metastatic pulmonary disease.   Original Report Authenticated By: Rudie Meyer, M.D.       Recent Labs: Lab Results  Component Value Date   WBC 7.4 11/01/2010   HGB 11.9* 11/01/2010   HCT 36.7 11/01/2010   PLT 359 11/01/2010   GLUCOSE 88 11/01/2010   ALT 28 11/01/2010   AST 24 11/01/2010   NA 141 11/01/2010   K 4.2 11/01/2010   CL 101 11/01/2010   CREATININE 0.64 11/01/2010   BUN 30* 11/01/2010   CO2  31 11/01/2010   INR 1.01 11/01/2010      Assessment / Plan:     Patient is 18  months following transmural chest wall resection and left upper lobectomy for moderately differentiated adenocarcinoma/Pancoast tumor  ypTX , pN0  By Physical exam and CT there is no evidence of recurrent disease.  She does have a painful sternal wire which is nearly eroding through the skin, I discussed with her removal of this both for relief of discomfort and also to prevent it from eroding or breaking down the overlying skin. She is agreeable with this we will proceed on Monday, December 30.   I will plan to see her back in 6 months in the office with a repeat CT scan of the chest  [name] Ovens MD 01/13/2012 11:56 AM

## 2012-01-13 NOTE — Patient Instructions (Addendum)
Ct scan looks good Return in 6 months with follow up ct of chest  Return Dec 30 for removal of sternal wire  Avoid asa until then

## 2012-01-19 ENCOUNTER — Encounter (HOSPITAL_COMMUNITY): Payer: Self-pay

## 2012-01-22 ENCOUNTER — Encounter (HOSPITAL_COMMUNITY): Payer: Self-pay | Admitting: Certified Registered"

## 2012-01-22 MED ORDER — DEXTROSE 5 % IV SOLN
1.5000 g | INTRAVENOUS | Status: AC
  Administered 2012-01-23: 1.5 g via INTRAVENOUS
  Filled 2012-01-22: qty 1.5

## 2012-01-23 ENCOUNTER — Encounter (HOSPITAL_COMMUNITY): Payer: Self-pay | Admitting: Certified Registered"

## 2012-01-23 ENCOUNTER — Ambulatory Visit (HOSPITAL_COMMUNITY): Payer: BC Managed Care – PPO | Admitting: Certified Registered"

## 2012-01-23 ENCOUNTER — Ambulatory Visit (HOSPITAL_COMMUNITY)
Admission: RE | Admit: 2012-01-23 | Discharge: 2012-01-23 | Disposition: A | Discharge status: Home / Self Care | Payer: BC Managed Care – PPO | Source: Ambulatory Visit | Attending: Cardiothoracic Surgery | Admitting: Cardiothoracic Surgery

## 2012-01-23 ENCOUNTER — Encounter (HOSPITAL_COMMUNITY)
Admission: RE | Disposition: A | Discharge status: Home / Self Care | Payer: Self-pay | Source: Ambulatory Visit | Attending: Cardiothoracic Surgery

## 2012-01-23 ENCOUNTER — Encounter (HOSPITAL_COMMUNITY): Payer: Self-pay | Admitting: *Deleted

## 2012-01-23 ENCOUNTER — Ambulatory Visit (HOSPITAL_COMMUNITY): Payer: BC Managed Care – PPO

## 2012-01-23 DIAGNOSIS — T8189XA Other complications of procedures, not elsewhere classified, initial encounter: Secondary | ICD-10-CM

## 2012-01-23 DIAGNOSIS — R0789 Other chest pain: Secondary | ICD-10-CM

## 2012-01-23 DIAGNOSIS — R072 Precordial pain: Secondary | ICD-10-CM | POA: Insufficient documentation

## 2012-01-23 DIAGNOSIS — Y831 Surgical operation with implant of artificial internal device as the cause of abnormal reaction of the patient, or of later complication, without mention of misadventure at the time of the procedure: Secondary | ICD-10-CM | POA: Insufficient documentation

## 2012-01-23 DIAGNOSIS — Z87891 Personal history of nicotine dependence: Secondary | ICD-10-CM | POA: Insufficient documentation

## 2012-01-23 DIAGNOSIS — Z85118 Personal history of other malignant neoplasm of bronchus and lung: Secondary | ICD-10-CM | POA: Insufficient documentation

## 2012-01-23 DIAGNOSIS — IMO0002 Reserved for concepts with insufficient information to code with codable children: Secondary | ICD-10-CM | POA: Insufficient documentation

## 2012-01-23 HISTORY — PX: STERNAL WIRES REMOVAL: SHX2441

## 2012-01-23 LAB — COMPREHENSIVE METABOLIC PANEL
ALT: 15 U/L (ref 0–35)
AST: 14 U/L (ref 0–37)
Albumin: 3.5 g/dL (ref 3.5–5.2)
Alkaline Phosphatase: 107 U/L (ref 39–117)
BUN: 26 mg/dL — ABNORMAL HIGH (ref 6–23)
CO2: 22 mEq/L (ref 19–32)
Calcium: 8.8 mg/dL (ref 8.4–10.5)
Chloride: 105 mEq/L (ref 96–112)
Creatinine, Ser: 0.69 mg/dL (ref 0.50–1.10)
GFR calc Af Amer: >90 mL/min (ref >90)
GFR calc non Af Amer: >90 mL/min (ref >90)
Glucose, Bld: 106 mg/dL — ABNORMAL HIGH (ref 70–99)
Potassium: 3.9 mEq/L (ref 3.5–5.1)
Sodium: 140 mEq/L (ref 135–145)
Total Bilirubin: 0.2 mg/dL — ABNORMAL LOW (ref 0.3–1.2)
Total Protein: 6.8 g/dL (ref 6.0–8.3)

## 2012-01-23 LAB — TYPE AND SCREEN
ABO/RH(D): B POS
Antibody Screen: NEGATIVE

## 2012-01-23 LAB — BLOOD GAS, ARTERIAL
Acid-base deficit: 0.5 mmol/L (ref 0.0–2.0)
Bicarbonate: 23.7 mEq/L (ref 20.0–24.0)
Drawn by: 181601
FIO2: 0.21 %
O2 Saturation: 98.8 %
Patient temperature: 98.6
TCO2: 24.8 mmol/L (ref 0–100)
pCO2 arterial: 38.9 mmHg (ref 35.0–45.0)
pH, Arterial: 7.401 (ref 7.350–7.450)
pO2, Arterial: 105 mmHg — ABNORMAL HIGH (ref 80.0–100.0)

## 2012-01-23 LAB — CBC
HCT: 34.8 % — ABNORMAL LOW (ref 36.0–46.0)
Hemoglobin: 11 g/dL — ABNORMAL LOW (ref 12.0–15.0)
MCH: 29.2 pg (ref 26.0–34.0)
MCHC: 31.6 g/dL (ref 30.0–36.0)
MCV: 92.3 fL (ref 78.0–100.0)
Platelets: 283 10*3/uL (ref 150–400)
RBC: 3.77 MIL/uL — ABNORMAL LOW (ref 3.87–5.11)
RDW: 12.6 % (ref 11.5–15.5)
WBC: 6.1 10*3/uL (ref 4.0–10.5)

## 2012-01-23 LAB — PROTIME-INR
INR: 0.93 (ref 0.00–1.49)
Prothrombin Time: 12.4 seconds (ref 11.6–15.2)

## 2012-01-23 LAB — SURGICAL PCR SCREEN
MRSA, PCR: NEGATIVE
Staphylococcus aureus: NEGATIVE

## 2012-01-23 LAB — APTT: aPTT: 29 seconds (ref 24–37)

## 2012-01-23 SURGERY — REMOVAL, STERNAL WIRE
Anesthesia: Monitor Anesthesia Care | Site: Chest

## 2012-01-23 MED ORDER — LACTATED RINGERS IV SOLN
INTRAVENOUS | Status: DC | PRN
  Administered 2012-01-23: 07:00:00 via INTRAVENOUS

## 2012-01-23 MED ORDER — THROMBIN 20000 UNITS EX SOLR
CUTANEOUS | Status: AC
  Filled 2012-01-23: qty 20000

## 2012-01-23 MED ORDER — SODIUM CHLORIDE 0.9 % IR SOLN
Status: DC | PRN
  Administered 2012-01-23: 1000 mL

## 2012-01-23 MED ORDER — HYDROMORPHONE HCL PF 1 MG/ML IJ SOLN: 0.2500 mg | INTRAMUSCULAR | Status: DC | PRN

## 2012-01-23 MED ORDER — MUPIROCIN 2 % EX OINT
TOPICAL_OINTMENT | CUTANEOUS | Status: AC
  Filled 2012-01-23: qty 22

## 2012-01-23 MED ORDER — LIDOCAINE HCL (PF) 1 % IJ SOLN
INTRAMUSCULAR | Status: AC
  Filled 2012-01-23: qty 30

## 2012-01-23 MED ORDER — LIDOCAINE HCL (PF) 1 % IJ SOLN
INTRAMUSCULAR | Status: DC | PRN
  Administered 2012-01-23: 3 mL via SUBCUTANEOUS

## 2012-01-23 MED ORDER — FENTANYL CITRATE 0.05 MG/ML IJ SOLN
INTRAMUSCULAR | Status: DC | PRN
  Administered 2012-01-23: 25 ug via INTRAVENOUS
  Administered 2012-01-23: 50 ug via INTRAVENOUS
  Administered 2012-01-23: 25 ug via INTRAVENOUS

## 2012-01-23 MED ORDER — ONDANSETRON HCL 4 MG/2ML IJ SOLN
INTRAMUSCULAR | Status: DC | PRN
  Administered 2012-01-23: 4 mg via INTRAVENOUS

## 2012-01-23 MED ORDER — MIDAZOLAM HCL 5 MG/5ML IJ SOLN
INTRAMUSCULAR | Status: DC | PRN
  Administered 2012-01-23: 2 mg via INTRAVENOUS

## 2012-01-23 MED ORDER — ONDANSETRON HCL 4 MG/2ML IJ SOLN: 4.0000 mg | Freq: Once | INTRAMUSCULAR | Status: DC | PRN

## 2012-01-23 MED ORDER — PROPOFOL 10 MG/ML IV BOLUS
INTRAVENOUS | Status: DC | PRN
  Administered 2012-01-23 (×2): 10 mg via INTRAVENOUS
  Administered 2012-01-23: 20 mg via INTRAVENOUS

## 2012-01-23 MED ORDER — LIDOCAINE-EPINEPHRINE (PF) 1 %-1:200000 IJ SOLN
INTRAMUSCULAR | Status: AC
  Filled 2012-01-23: qty 10

## 2012-01-23 SURGICAL SUPPLY — 65 items
ATTRACTOMAT 16X20 MAGNETIC DRP (DRAPES) ×2 IMPLANT
BAG DECANTER FOR FLEXI CONT (MISCELLANEOUS) IMPLANT
BAG URIMETER BARDEX IC 350 (UROLOGICAL SUPPLIES) IMPLANT
BANDAGE GAUZE ELAST BULKY 4 IN (GAUZE/BANDAGES/DRESSINGS) IMPLANT
BENZOIN TINCTURE PRP APPL 2/3 (GAUZE/BANDAGES/DRESSINGS) IMPLANT
BLADE SURG 10 STRL SS (BLADE) IMPLANT
CANISTER SUCTION 2500CC (MISCELLANEOUS) ×2 IMPLANT
CATH FOLEY 2WAY SLVR  5CC 16FR (CATHETERS)
CATH FOLEY 2WAY SLVR 5CC 16FR (CATHETERS) IMPLANT
CATH THORACIC 28FR RT ANG (CATHETERS) IMPLANT
CATH THORACIC 36FR (CATHETERS) IMPLANT
CATH THORACIC 36FR RT ANG (CATHETERS) IMPLANT
CLIP TI WIDE RED SMALL 24 (CLIP) IMPLANT
CLOTH BEACON ORANGE TIMEOUT ST (SAFETY) ×2 IMPLANT
CONN Y 3/8X3/8X3/8  BEN (MISCELLANEOUS)
CONN Y 3/8X3/8X3/8 BEN (MISCELLANEOUS) IMPLANT
CONT SPEC 4OZ CLIKSEAL STRL BL (MISCELLANEOUS) IMPLANT
DERMABOND ADVANCED (GAUZE/BANDAGES/DRESSINGS) ×2
DERMABOND ADVANCED .7 DNX12 (GAUZE/BANDAGES/DRESSINGS) ×2 IMPLANT
DRAPE LAPAROSCOPIC ABDOMINAL (DRAPES) ×2 IMPLANT
DRAPE SLUSH MACHINE 52X66 (DRAPES) IMPLANT
DRAPE WARM FLUID 44X44 (DRAPE) IMPLANT
DRSG PAD ABDOMINAL 8X10 ST (GAUZE/BANDAGES/DRESSINGS) IMPLANT
ELECT REM PT RETURN 9FT ADLT (ELECTROSURGICAL) ×2
ELECTRODE REM PT RTRN 9FT ADLT (ELECTROSURGICAL) ×1 IMPLANT
GAUZE XEROFORM 5X9 LF (GAUZE/BANDAGES/DRESSINGS) IMPLANT
GLOVE BIO SURGEON STRL SZ 6.5 (GLOVE) ×2 IMPLANT
GLOVE BIOGEL M STER SZ 6 (GLOVE) ×2 IMPLANT
GLOVE SS BIOGEL STRL SZ 6 (GLOVE) ×1 IMPLANT
GLOVE SUPERSENSE BIOGEL SZ 6 (GLOVE) ×1
GOWN STRL NON-REIN LRG LVL3 (GOWN DISPOSABLE) ×4 IMPLANT
HANDPIECE INTERPULSE COAX TIP (DISPOSABLE)
HEMOSTAT POWDER SURGIFOAM 1G (HEMOSTASIS) IMPLANT
HEMOSTAT SURGICEL 2X14 (HEMOSTASIS) IMPLANT
KIT BASIN OR (CUSTOM PROCEDURE TRAY) ×2 IMPLANT
KIT ROOM TURNOVER OR (KITS) ×2 IMPLANT
KIT SUCTION CATH 14FR (SUCTIONS) IMPLANT
MARKER SKIN DUAL TIP RULER LAB (MISCELLANEOUS) IMPLANT
NS IRRIG 1000ML POUR BTL (IV SOLUTION) ×2 IMPLANT
PACK CHEST (CUSTOM PROCEDURE TRAY) ×2 IMPLANT
PAD ARMBOARD 7.5X6 YLW CONV (MISCELLANEOUS) ×4 IMPLANT
PIN SAFETY STERILE (MISCELLANEOUS) IMPLANT
RUBBERBAND STERILE (MISCELLANEOUS) IMPLANT
SET HNDPC FAN SPRY TIP SCT (DISPOSABLE) IMPLANT
SOLUTION BETADINE 4OZ (MISCELLANEOUS) IMPLANT
SPONGE GAUZE 4X4 12PLY (GAUZE/BANDAGES/DRESSINGS) ×2 IMPLANT
SPONGE LAP 18X18 X RAY DECT (DISPOSABLE) IMPLANT
STAPLER VISISTAT 35W (STAPLE) IMPLANT
STRAP MONTGOMERY 1.25X11-1/8 (MISCELLANEOUS) IMPLANT
SUT ETHILON 3 0 FSL (SUTURE) IMPLANT
SUT STEEL 6MS V (SUTURE) IMPLANT
SUT STEEL STERNAL CCS#1 18IN (SUTURE) IMPLANT
SUT STEEL SZ 6 DBL 3X14 BALL (SUTURE) IMPLANT
SUT VIC AB 1 CTX 36 (SUTURE)
SUT VIC AB 1 CTX36XBRD ANBCTR (SUTURE) IMPLANT
SUT VIC AB 2-0 CTX 27 (SUTURE) IMPLANT
SUT VIC AB 3-0 X1 27 (SUTURE) ×2 IMPLANT
SUT VICRYL 4-0 PS2 18IN ABS (SUTURE) ×2 IMPLANT
SWAB COLLECTION DEVICE MRSA (MISCELLANEOUS) IMPLANT
SYR 5ML LL (SYRINGE) IMPLANT
SYSTEM SAHARA CHEST DRAIN ATS (WOUND CARE) IMPLANT
TOWEL OR 17X24 6PK STRL BLUE (TOWEL DISPOSABLE) IMPLANT
TOWEL OR 17X26 10 PK STRL BLUE (TOWEL DISPOSABLE) ×2 IMPLANT
TUBE ANAEROBIC SPECIMEN COL (MISCELLANEOUS) IMPLANT
WATER STERILE IRR 1000ML POUR (IV SOLUTION) ×4 IMPLANT

## 2012-01-23 NOTE — Anesthesia Postprocedure Evaluation (Signed)
  Anesthesia Post-op Note  Patient: [name]  Procedure(s) Performed: Procedure(s) (LRB) with comments: STERNAL WIRES REMOVAL (N/A)  Patient Location: PACU  Anesthesia Type:MAC  Level of Consciousness: awake, alert , oriented and patient cooperative  Airway and Oxygen Therapy: Patient Spontanous Breathing  Post-op Pain: none  Post-op Assessment: Post-op Vital signs reviewed, Patient's Cardiovascular Status Stable, Respiratory Function Stable, Patent Airway, No signs of Nausea or vomiting and Pain level controlled  Post-op Vital Signs: stable  Complications: No apparent anesthesia complications

## 2012-01-23 NOTE — Transfer of Care (Signed)
Immediate Anesthesia Transfer of Care Note  Patient: [name]  Procedure(s) Performed: Procedure(s) (LRB) with comments: STERNAL WIRES REMOVAL (N/A)  Patient Location: PACU  Anesthesia Type:MAC  Level of Consciousness: awake, alert  and oriented  Airway & Oxygen Therapy: Patient Spontanous Breathing  Post-op Assessment: Report given to PACU RN and Post -op Vital signs reviewed and stable  Post vital signs: Reviewed and stable  Complications: No apparent anesthesia complications

## 2012-01-23 NOTE — Op Note (Signed)
[name], [name] NO.:  [id number]  MEDICAL RECORD NO.:  [id number]  LOCATION:  [facility]                         FACILITY:  [facility]  PHYSICIAN:  Sheliah Plane, MD    DATE OF BIRTH:  February 09, 1960  DATE OF PROCEDURE:  01/23/2012 DATE OF DISCHARGE:  01/23/2012                              OPERATIVE REPORT   PREOPERATIVE DIAGNOSIS:  Painful sternal wires status post partial sternotomy for resection of Pancoast tumor.  POSTOPERATIVE DIAGNOSIS:  Painful sternal wires status post partial sternotomy for resection of Pancoast tumor.  SURGICAL PROCEDURE:  Removal of painful sternal wire.  BRIEF HISTORY:  The patient is a 51 year old female who had undergone transmanubrial anterolateral thoracotomy with chest wall resection and left upper lobectomy for Pancoast tumor in June 2012.  In followup in the office, she showed no evidence of recurrence, but on her last visit one of the sternal wires it was noted to be very painful to touch and very close to eroding through this skin.  Removal was recommended, the patient agreed and signed informed consent.  DESCRIPTION OF PROCEDURE:  With light intravenous sedation, the anterior chest was prepped with Betadine and draped in a sterile manner. Lidocaine 1% was infiltrated in the area.  Small incision was made over the sternal wire and the sternal wire was dissected out untwisted and was able to be pulled out in its entirety.  The patient tolerated this well.  3-0 subcutaneous Vicryl sutures were placed.  A running 4-0 subcuticular stitch was placed in the skin edge.  Dermabond was applied and sponge and needle count was reported as correct.  The patient was transferred to the recovery room having tolerated the procedure without obvious complication for discharge home later today.     Sheliah Plane, MD     EG/MEDQ  D:  01/23/2012  T:  01/23/2012  Job:  [id number]

## 2012-01-23 NOTE — Progress Notes (Signed)
PATIENT UNABLE  TO  URINATE  FOR  SAMPLE...DA

## 2012-01-23 NOTE — H&P (Signed)
[address]            [place] [address]          [phone]          [facility] Medical Record #[id number] Date of Birth: Jul 01, 1960  No ref. provider found Samuel Jester, DO  Chief Complaint:   PostOp Follow Up Visit 06/25/2010    OPERATIVE REPORT  PREOPERATIVE DIAGNOSIS: Pancoast tumor.  POSTOPERATIVE DIAGNOSIS: Pancoast tumor.  PROCEDURE PERFORMED: Transmanubrial anterolateral thoracotomy with  chest wall resection including the first rib and left upper lobectomy  for Pancoast tumor.  ypTX , pN0   History of Present Illness:      Patient returns for followup visit today now little over a year after resection of Pancoast tumor through a trans-manubrial anterolateral thoracotomy with chest wall resection. She's done well postoperatively notes that she has full range of motion and function of her left arm. Since diagnosed with lung cancer she has not been smoking.  Past several months she has noticed a painful sternal wire.        History  Smoking status  . Former Smoker  . Quit date: 02/04/2010  Smokeless tobacco  . Never Used       No Known Allergies  Current Facility-Administered Medications  Medication Dose Route Frequency Provider Last Rate Last Dose  . cefUROXime (ZINACEF) 1.5 g in dextrose 5 % 50 mL IVPB  1.5 g Intravenous 60 min Pre-Op Delight Ovens, MD      . mupirocin ointment (BACTROBAN) 2 %            Facility-Administered Medications Ordered in Other Encounters  Medication Dose Route Frequency Provider Last Rate Last Dose  . lactated ringers infusion    Continuous PRN Sheppard Evens, CRNA           Physical Exam: BP 127/76  Pulse 60  Temp 97.9 F (36.6 C) (Oral)  Resp 20  Ht 5\' 4"  (1.626 m)  Wt 135 lb 12.9 oz (61.6 kg)  BMI 23.31 kg/m2  SpO2 98%  General appearance: alert and cooperative Neurologic: intact Heart: regular rate and rhythm, S1, S2  normal, no murmur, click, rub or gallop and normal apical impulse Lungs: clear to auscultation bilaterally Wound: Her left chest incision is well-healed she has full range of motion of her left shoulder I do not appreciate any cervical supraclavicular or axillary adenopathy The lower sternal wire placed at the time of her partial sternotomy now is close to eroding through the skin and is painful to touch.  Diagnostic Studies & Laboratory data:         Recent Radiology Findings: Dg Chest 2 View  01/23/2012  *RADIOLOGY REPORT*  Clinical Data: Preoperative chest radiograph.  CHEST - 2 VIEW  Comparison: Chest radiograph performed 11/01/2010, and CT of the chest performed 01/12/2012  Findings: The lungs are well-aerated and clear.  There is no evidence of focal opacification, pleural effusion or pneumothorax.  The heart is normal in size; median sternotomy wires are noted at the superior mediastinum.  Scattered clips are seen overlying the left lung apex.  No acute osseous abnormalities are seen.  There is surgical absence of much of the left first rib.  IMPRESSION: No acute cardiopulmonary process seen.  Original Report Authenticated By: Tonia Ghent, M.D.       Recent Labs: Lab Results  Component Value Date   WBC 6.1 01/23/2012   HGB 11.0* 01/23/2012   HCT 34.8* 01/23/2012   PLT 283 01/23/2012   GLUCOSE 106* 01/23/2012   ALT 15 01/23/2012   AST 14 01/23/2012   NA 140 01/23/2012   K 3.9 01/23/2012   CL 105 01/23/2012   CREATININE 0.69 01/23/2012   BUN 26* 01/23/2012   CO2 22 01/23/2012   INR 0.93 01/23/2012      Assessment / Plan:     Patient is 18  months following transmural chest wall resection and left upper lobectomy for moderately differentiated adenocarcinoma/Pancoast tumor  ypTX , pN0  By Physical exam and CT there is no evidence of recurrent disease.  She does have a painful sternal wire which is nearly eroding through the skin, I discussed with her removal of this  both for relief of discomfort and also to prevent it from eroding or breaking down the overlying skin. She is agreeable with this we will proceed today.   The goals risks and alternatives of the planned surgical procedure removal of painful sternal wire  have been discussed with the patient in detail. The risks of the procedure including death, infection, stroke, myocardial infarction, bleeding, blood transfusion have all been discussed specifically.  I have quoted Bayleigh T Imber a less then 1 % of perioperative mortality and a complication rate as high as 5 %. The patient's questions have been answered.Vallerie T Bateson is willing  to proceed with the planned procedure.   Delight Ovens MD 01/23/2012 7:19 AM

## 2012-01-23 NOTE — Brief Op Note (Signed)
01/23/2012  8:10 AM  PATIENT:  [name]  51 y.o. female  PRE-OPERATIVE DIAGNOSIS:  PAINFUL STERNAL WIRE  POST-OPERATIVE DIAGNOSIS:  Painful Sternal Wire  PROCEDURE:  Procedure(s) (LRB) with comments: STERNAL WIRES REMOVAL (N/A)  SURGEON:  Surgeon(s) and Role:    * Delight Ovens, MD - Primary  PHYSICIAN ASSISTANT:   ASSISTANTS: none   ANESTHESIA:   local  EBL:  Total I/O In: 300 [I.V.:300] Out: -   BLOOD ADMINISTERED:none  DRAINS: none   LOCAL MEDICATIONS USED:  LIDOCAINE   SPECIMEN:  No Specimen  DISPOSITION OF SPECIMEN:  N/A  COUNTS:  YES  TOURNIQUET:  * No tourniquets in log *  DICTATION: .Other Dictation: Dictation Number   PLAN OF CARE: Discharge to home after PACU  PATIENT DISPOSITION:  PACU - hemodynamically stable.   Delay start of Pharmacological VTE agent (>24hrs) due to surgical blood loss or risk of bleeding: yes

## 2012-01-23 NOTE — Anesthesia Preprocedure Evaluation (Addendum)
Anesthesia Evaluation  Patient identified by MRN, date of birth, ID band Patient awake    Reviewed: Allergy & Precautions, H&P , NPO status , Patient's Chart, lab work & pertinent test results  Airway Mallampati: I TM Distance: >3 FB Neck ROM: full    Dental   Pulmonary          Cardiovascular hypertension, Rhythm:regular Rate:Normal     Neuro/Psych Anxiety    GI/Hepatic   Endo/Other    Renal/GU      Musculoskeletal   Abdominal   Peds  Hematology   Anesthesia Other Findings Lung CA  Reproductive/Obstetrics                           Anesthesia Physical Anesthesia Plan  ASA: II  Anesthesia Plan: MAC   Post-op Pain Management:    Induction: Intravenous  Airway Management Planned:   Additional Equipment:   Intra-op Plan:   Post-operative Plan: Extubation in OR  Informed Consent: I have reviewed the patients History and Physical, chart, labs and discussed the procedure including the risks, benefits and alternatives for the proposed anesthesia with the patient or authorized representative who has indicated his/her understanding and acceptance.   Dental advisory given  Plan Discussed with: CRNA, Anesthesiologist and Surgeon  Anesthesia Plan Comments:        Anesthesia Quick Evaluation

## 2012-01-24 ENCOUNTER — Encounter (HOSPITAL_COMMUNITY): Payer: Self-pay | Admitting: Cardiothoracic Surgery

## 2012-01-24 MED FILL — Mupirocin Oint 2%: CUTANEOUS | Qty: 22 | Status: AC

## 2012-06-15 ENCOUNTER — Other Ambulatory Visit: Payer: Self-pay | Admitting: *Deleted

## 2012-06-21 ENCOUNTER — Other Ambulatory Visit: Payer: Self-pay | Admitting: *Deleted

## 2012-07-19 ENCOUNTER — Ambulatory Visit (INDEPENDENT_AMBULATORY_CARE_PROVIDER_SITE_OTHER): Payer: BC Managed Care – PPO | Admitting: Cardiothoracic Surgery

## 2012-07-19 ENCOUNTER — Ambulatory Visit
Admission: RE | Admit: 2012-07-19 | Discharge: 2012-07-19 | Disposition: A | Discharge status: Home / Self Care | Payer: BC Managed Care – PPO | Source: Ambulatory Visit | Attending: Cardiothoracic Surgery | Admitting: Cardiothoracic Surgery

## 2012-07-19 ENCOUNTER — Encounter: Payer: Self-pay | Admitting: Cardiothoracic Surgery

## 2012-07-19 VITALS — BP 127/82 | HR 82 | Resp 16 | Ht 64.0 in | Wt 145.0 lb

## 2012-07-19 DIAGNOSIS — C3410 Malignant neoplasm of upper lobe, unspecified bronchus or lung: Secondary | ICD-10-CM

## 2012-07-19 DIAGNOSIS — Z9889 Other specified postprocedural states: Secondary | ICD-10-CM

## 2012-07-19 DIAGNOSIS — C3412 Malignant neoplasm of upper lobe, left bronchus or lung: Secondary | ICD-10-CM

## 2012-07-19 NOTE — Progress Notes (Signed)
[address]       [place] [address]             [phone]                                       [name] Medical Record #[id number] Date of Birth: 10-Jul-1960  Samuel Jester, DO Samuel Jester, DO  Chief Complaint:   PostOp Follow Up Visit 06/25/2010    OPERATIVE REPORT  PREOPERATIVE DIAGNOSIS: Pancoast tumor.  POSTOPERATIVE DIAGNOSIS: Pancoast tumor.  PROCEDURE PERFORMED: Transmanubrial anterolateral thoracotomy with  chest wall resection including the first rib and left upper lobectomy  for Pancoast tumor.  ypTX , pN0   History of Present Illness:      Patient returns for followup visit today now little over a year after resection of Pancoast tumor through a trans-manubrial anterolateral thoracotomy with chest wall resection. She's done well postoperatively notes that she has full range of motion and function of her left arm. Since diagnosed with lung cancer she has not been smoking.   Pain sternal wire was removed without any further wound problems.  Several months ago had one episode chocking on meat, but no further swallowing problems       History  Smoking status  . Former Smoker  . Quit date: 02/04/2010  Smokeless tobacco  . Never Used       No Known Allergies  Current Outpatient Prescriptions  Medication Sig Dispense Refill  . ALPRAZolam (XANAX) 0.5 MG tablet Take 0.5 mg by mouth at bedtime.       . citalopram (CELEXA) 20 MG tablet Take 20 mg by mouth daily.      Marland Kitchen estradiol (ESTRACE) 0.1 MG/GM vaginal cream Place 2 g vaginally once a week. As determined by patient      . HYDROcodone-acetaminophen (NORCO) 10-325 MG per tablet Take 1 tablet by mouth every 8 (eight) hours as needed. pain      . lisinopril (PRINIVIL,ZESTRIL) 40 MG tablet Take 20 mg by mouth daily.      . meloxicam (MOBIC) 7.5 MG tablet Take 7.5 mg by mouth daily.      . Oxymetazoline HCl (SINEX ULTRA FINE MIST 12-HOUR NA) Place 1  Squirt into the nose every 12 (twelve) hours as needed. congestion      . Pseudoephedrine-Ibuprofen (ADVIL COLD & SINUS LIQUI-GELS) 30-200 MG CAPS Take 1 capsule by mouth 2 (two) times daily as needed. sinus       No current facility-administered medications for this visit.       Physical Exam: BP 127/82  Pulse 82  Resp 16  Ht 5\' 4"  (1.626 m)  Wt 145 lb (65.772 kg)  BMI 24.88 kg/m2  SpO2 96%  General appearance: alert and cooperative Neurologic: intact Heart: regular rate and rhythm, S1, S2 normal, no murmur, click, rub or gallop and normal apical impulse Lungs: clear to auscultation bilaterally Wound: Her left chest incision is well-healed she has full range of motion of her left shoulder I do not appreciate any cervical supraclavicular or axillary adenopathy  Diagnostic Studies & Laboratory data:         Recent Radiology Findings: Ct Chest Wo Contrast  07/19/2012   *RADIOLOGY REPORT*  Clinical Data: Lung cancer follow-up.  CT CHEST WITHOUT CONTRAST  Technique:  Multidetector CT imaging of the chest was performed following the  standard protocol without IV contrast.  Comparison: 01/12/2012.  Findings: Stable surgical changes from a left upper lobe lobectomy. No findings for residual or recurrent tumor.  No metastatic pulmonary disease.  Stable emphysematous changes.  No pleural effusion. Stable left apical pleural thickening and surgical clips.  No mediastinal or hilar lymphadenopathy.  The aorta is stable.  The esophagus is grossly normal.  The chest wall is unremarkable.  No breast masses, supraclavicular or axillary lymphadenopathy.  The bony thorax is intact.  There are surgical changes from the median sternotomy.  No destructive bone lesions or spinal canal compromise.  The upper abdomen is unremarkable.  No adrenal gland lesions.  IMPRESSION:  Stable CT appearance of the chest.  No findings for residual/recurrent tumor or metastatic disease.   Original Report Authenticated By: Rudie Meyer, M.D.      Recent Labs: Lab Results  Component Value Date   WBC 6.1 01/23/2012   HGB 11.0* 01/23/2012   HCT 34.8* 01/23/2012   PLT 283 01/23/2012   GLUCOSE 106* 01/23/2012   ALT 15 01/23/2012   AST 14 01/23/2012   NA 140 01/23/2012   K 3.9 01/23/2012   CL 105 01/23/2012   CREATININE 0.69 01/23/2012   BUN 26* 01/23/2012   CO2 22 01/23/2012   INR 0.93 01/23/2012      Assessment / Plan:     Patient is 24  months following transmural chest wall resection and left upper lobectomy for moderately differentiated adenocarcinoma/Pancoast tumor  ypTX , pN0  By Physical exam and CT there is no evidence of recurrent disease.   I will plan to see her back in 6 months in the office with a repeat CT scan of the chest  Delight Ovens MD 07/19/2012 10:01 AM

## 2012-07-19 NOTE — Patient Instructions (Signed)
Return 6 months for repeat ct of chest

## 2012-07-26 ENCOUNTER — Ambulatory Visit: Payer: BC Managed Care – PPO | Admitting: Cardiothoracic Surgery

## 2012-12-06 ENCOUNTER — Other Ambulatory Visit: Payer: Self-pay

## 2012-12-06 DIAGNOSIS — D381 Neoplasm of uncertain behavior of trachea, bronchus and lung: Secondary | ICD-10-CM

## 2012-12-27 ENCOUNTER — Ambulatory Visit
Admission: RE | Admit: 2012-12-27 | Discharge: 2012-12-27 | Disposition: A | Discharge status: Home / Self Care | Payer: BC Managed Care – PPO | Source: Ambulatory Visit | Attending: Cardiothoracic Surgery | Admitting: Cardiothoracic Surgery

## 2012-12-27 ENCOUNTER — Encounter: Payer: Self-pay | Admitting: Cardiothoracic Surgery

## 2012-12-27 ENCOUNTER — Ambulatory Visit (INDEPENDENT_AMBULATORY_CARE_PROVIDER_SITE_OTHER): Payer: BC Managed Care – PPO | Admitting: Cardiothoracic Surgery

## 2012-12-27 VITALS — BP 123/85 | HR 65 | Resp 16 | Ht 64.0 in | Wt 147.0 lb

## 2012-12-27 DIAGNOSIS — C3410 Malignant neoplasm of upper lobe, unspecified bronchus or lung: Secondary | ICD-10-CM

## 2012-12-27 DIAGNOSIS — C3490 Malignant neoplasm of unspecified part of unspecified bronchus or lung: Secondary | ICD-10-CM

## 2012-12-27 DIAGNOSIS — D381 Neoplasm of uncertain behavior of trachea, bronchus and lung: Secondary | ICD-10-CM

## 2012-12-27 DIAGNOSIS — C3412 Malignant neoplasm of upper lobe, left bronchus or lung: Secondary | ICD-10-CM

## 2012-12-27 NOTE — Progress Notes (Signed)
[name] Medical Record #[id number] Date of Birth: 1960-05-05  [name] Jester, DO [name] Jester, DO  Chief Complaint:   PostOp Follow Up Visit 06/25/2010  PREOPERATIVE DIAGNOSIS: Pancoast tumor.  POSTOPERATIVE DIAGNOSIS: Pancoast tumor.  PROCEDURE PERFORMED: Transmanubrial anterolateral thoracotomy with  chest wall resection including the first rib and left upper lobectomy  for Pancoast tumor.  ypTX , pN0   History of Present Illness:      Patient returns for followup visit today now little over a year after resection of Pancoast tumor through a trans-manubrial anterolateral thoracotomy with chest wall resection. She's done well postoperatively notes that she has full range of motion and function of her left arm. She does have some chronic pain in the posterior aspect of her left shoulder, but notes that this is tolerable Since diagnosed with lung cancer she has not been smoking.    History  Smoking status  . Former Smoker  . Quit date: 02/04/2010  Smokeless tobacco  . Never Used       No Known Allergies  Current Outpatient Prescriptions  Medication Sig Dispense Refill  . DULoxetine (CYMBALTA) 20 MG capsule Take 20 mg by mouth 3 (three) times daily.      Marland Kitchen HYDROcodone-acetaminophen (NORCO) 10-325 MG per tablet Take 1 tablet by mouth every 8 (eight) hours as needed. pain      . lisinopril (PRINIVIL,ZESTRIL) 40 MG tablet Take 20 mg by mouth daily.      Marland Kitchen LORazepam (ATIVAN) 0.5 MG tablet Take 0.5 mg by mouth at bedtime.       No current facility-administered medications for this visit.       Physical Exam: BP 123/85  Pulse 65  Resp 16  Ht 5\' 4"  (1.626 m)  Wt 147 lb (66.679 kg)  BMI 25.22 kg/m2  SpO2 98%  General appearance: alert and cooperative Neurologic: intact Heart: regular rate and rhythm, S1, S2 normal, no murmur, click, rub or gallop and normal apical impulse Lungs: clear to  auscultation bilaterally Wound: Her left chest incision is well-healed she has full range of motion of her left shoulder I do not appreciate any cervical supraclavicular or axillary adenopathy  Diagnostic Studies & Laboratory data:         Recent Radiology Findings: Ct Chest Wo Contrast  12/27/2012   CLINICAL DATA:  Patient is status post left upper lobe lobectomy history of neoplastic disease  EXAM: CT CHEST WITHOUT CONTRAST  TECHNIQUE: Multidetector CT imaging of the chest was performed following the standard protocol without IV contrast.  COMPARISON:  07/19/2012  FINDINGS: Stable apical pleural thickening is identified on the left as well as surgical clips along the anterior medial periphery of the left hemithorax and left hilar region. There is no evidence suggesting recurrent disease. There is no evidence of mediastinal nor hilar adenopathy nor masses.  Stable emphysematous changes appreciated within the right and left hemithoraces. There is no evidence of pulmonary nodules, masses, infiltrates, effusions, nor edema. The chest wall findings are unchanged when compared to previous study. There is no evidence of supraclavicular adenopathy nor CT evidence of breast nodules nor masses. Stable small subcentimeter axillary lymph nodes are identified.  The thoracic inlet is unremarkable.  The osseous structures demonstrate no evidence of aggressive  appearing lesions more canal stenosis. The findings consistent with a prior median sternotomy.  Noncontrast evaluation visualized upper abdominal viscera demonstrates no abnormalities.  IMPRESSION: Stable chest CT when compared to previous study dated 07/19/2012. There is no evidence reflect the sequela of recurrent or active disease.   Electronically Signed   By: Salome Holmes M.D.   On: 12/27/2012 15:53      Recent Labs: Lab Results  Component Value Date   WBC 6.1 01/23/2012   HGB 11.0* 01/23/2012   HCT 34.8* 01/23/2012   PLT 283 01/23/2012   GLUCOSE  106* 01/23/2012   ALT 15 01/23/2012   AST 14 01/23/2012   NA 140 01/23/2012   K 3.9 01/23/2012   CL 105 01/23/2012   CREATININE 0.69 01/23/2012   BUN 26* 01/23/2012   CO2 22 01/23/2012   INR 0.93 01/23/2012      Assessment / Plan:     Patient is 2.5 years  months following transmural chest wall resection and left upper lobectomy for moderately differentiated adenocarcinoma/Pancoast tumor  ypTX , pN0 By Physical exam and CT there is no evidence of recurrent disease. I will plan to see her back in 12 months in the office with a repeat CT scan of the chest  Delight Ovens MD 12/27/2012 4:29 PM

## 2012-12-28 ENCOUNTER — Telehealth: Payer: Self-pay

## 2012-12-28 NOTE — Telephone Encounter (Signed)
Pt called to give update on current medications. She was unable to give yesterday at appointment. Medication list was updated.

## 2013-04-04 ENCOUNTER — Ambulatory Visit: Payer: BC Managed Care – PPO | Attending: *Deleted | Admitting: Physical Therapy

## 2013-04-04 DIAGNOSIS — R5381 Other malaise: Secondary | ICD-10-CM | POA: Insufficient documentation

## 2013-04-04 DIAGNOSIS — R293 Abnormal posture: Secondary | ICD-10-CM | POA: Insufficient documentation

## 2013-04-04 DIAGNOSIS — M25519 Pain in unspecified shoulder: Secondary | ICD-10-CM | POA: Diagnosis not present

## 2013-04-04 DIAGNOSIS — IMO0001 Reserved for inherently not codable concepts without codable children: Secondary | ICD-10-CM | POA: Diagnosis present

## 2013-04-09 ENCOUNTER — Encounter: Payer: BC Managed Care – PPO | Admitting: Physical Therapy

## 2013-04-11 ENCOUNTER — Ambulatory Visit: Payer: BC Managed Care – PPO | Admitting: Physical Therapy

## 2013-04-11 DIAGNOSIS — IMO0001 Reserved for inherently not codable concepts without codable children: Secondary | ICD-10-CM | POA: Diagnosis not present

## 2013-04-16 ENCOUNTER — Ambulatory Visit: Payer: BC Managed Care – PPO | Admitting: Physical Therapy

## 2013-04-16 DIAGNOSIS — IMO0001 Reserved for inherently not codable concepts without codable children: Secondary | ICD-10-CM | POA: Diagnosis not present

## 2013-04-18 ENCOUNTER — Encounter: Payer: Self-pay | Admitting: Gastroenterology

## 2013-04-18 ENCOUNTER — Ambulatory Visit: Payer: BC Managed Care – PPO | Admitting: Physical Therapy

## 2013-04-18 ENCOUNTER — Ambulatory Visit (INDEPENDENT_AMBULATORY_CARE_PROVIDER_SITE_OTHER): Payer: BC Managed Care – PPO | Admitting: Gastroenterology

## 2013-04-18 ENCOUNTER — Encounter (INDEPENDENT_AMBULATORY_CARE_PROVIDER_SITE_OTHER): Payer: Self-pay

## 2013-04-18 VITALS — BP 114/80 | HR 77 | Temp 97.4°F | Ht 64.0 in | Wt 145.2 lb

## 2013-04-18 DIAGNOSIS — IMO0001 Reserved for inherently not codable concepts without codable children: Secondary | ICD-10-CM | POA: Diagnosis not present

## 2013-04-18 DIAGNOSIS — K5900 Constipation, unspecified: Secondary | ICD-10-CM | POA: Insufficient documentation

## 2013-04-18 DIAGNOSIS — R066 Hiccough: Secondary | ICD-10-CM

## 2013-04-18 MED ORDER — LUBIPROSTONE 24 MCG PO CAPS: 24.0000 ug | ORAL_CAPSULE | Freq: Two times a day (BID) | ORAL | Status: DC

## 2013-04-18 MED ORDER — LUBIPROSTONE 24 MCG PO CAPS
24.0000 ug | ORAL_CAPSULE | Freq: Two times a day (BID) | ORAL | Status: DC
Start: 2013-04-18 — End: 2013-06-21

## 2013-04-18 NOTE — Progress Notes (Signed)
Referring Provider: Octavio Graves, DO Primary Care Physician:  Octavio Graves, DO Primary Gastroenterologist:  Dr. Gala Romney   Chief Complaint  Patient presents with  . Constipation    HPI:   [name] presents today at the request of [name], PA, secondary to severe constipation. Last colonoscopy around 2 years ago in [place], [place]. Has had chronic constipation since diagnosis of lung cancer about 2 years ago. Takes 3 pain medications a day. Takes Linzess 290 mcg each morning, Senna along with it. Has to use an enema or suppository in order to start having a BM. Go up to 4 days at a time without BM. Used enema last night. Never feels completely empty. Has taken Linzess at night and then had diarrhea. Has to push very hard just to get going. Sometimes feels like having a baby. Feels like abdomen starts cramping while trying to get it out. Trying to eat high fiber diet, drinking lots of fluids/water. Trying to cut pain medication down. Now 3 every 24 hours. Scant hematochezia after significant straining one time. Never tried Amitiza. States stool is thin, pencil-like.     TSH normal. Eats slowly now. Always gets the hiccups when eating. After lung surgery had problems swallowing. Dysphagia resolved. No reflux.   Past Medical History  Diagnosis Date  . Lung cancer     pancoast tumor  . Hypertension   . Anxiety     Past Surgical History  Procedure Laterality Date  . Thoracotomy  [id number]    transsmanubrial anterolaterl thoracotomy with chest wall resection includinf the first rib and left  upper lobectomy for Pancoast turmor  . Portacath placement  [id number]    Rt   IJ  Port-A-Cath  [email or web address]  . Tubal ligation    . Sternal wires removal  01/23/2012    Procedure: STERNAL WIRES REMOVAL;  Surgeon: Grace Isaac, MD;  Location: [facility];  Service: Thoracic;  Laterality: N/A;  . Colonoscopy  12/2010    in [place]    Current Outpatient Prescriptions    Medication Sig Dispense Refill  . Cholecalciferol (VITAMIN D3) 5000 UNITS CAPS Take by mouth daily.      . DULoxetine (CYMBALTA) 20 MG capsule Take 20 mg by mouth 3 (three) times daily.      Marland Kitchen HYDROcodone-acetaminophen (NORCO) 10-325 MG per tablet Take 1 tablet by mouth every 8 (eight) hours as needed. pain      . Linaclotide (LINZESS) 290 MCG CAPS capsule Take 290 mcg by mouth daily.      Marland Kitchen lisinopril (PRINIVIL,ZESTRIL) 40 MG tablet Take 20 mg by mouth daily.      Marland Kitchen LORazepam (ATIVAN) 0.5 MG tablet Take 0.5 mg by mouth at bedtime.      . senna (SENOKOT) 8.6 MG tablet Take 1 tablet by mouth daily.      . traZODone (DESYREL) 50 MG tablet Take 50 mg by mouth at bedtime.       No current facility-administered medications for this visit.    Allergies as of 04/18/2013  . (No Known Allergies)    Family History  Problem Relation Age of Onset  . Colon cancer Neg Hx     History   Social History  . Marital Status: Married    Spouse Name: N/A    Number of Children: N/A  . Years of Education: N/A   Occupational History  . Not on file.   Social History Main Topics  . Smoking status: Former Smoker  Quit date: 02/04/2010  . Smokeless tobacco: Never Used  . Alcohol Use: No  . Drug Use: No  . Sexual Activity: Not on file   Other Topics Concern  . Not on file   Social History Narrative  . No narrative on file    Review of Systems: As mentioned in HPI.   Physical Exam: BP 114/80  Pulse 77  Temp(Src) 97.4 F (36.3 C) (Oral)  Ht 5\' 4"  (1.626 m)  Wt 145 lb 3.2 oz (65.862 kg)  BMI 24.91 kg/m2 General:   Alert and oriented. Well-developed, well-nourished, pleasant and cooperative. Head:  Normocephalic and atraumatic. Eyes:  Conjunctiva pink, sclera clear, no icterus.   Conjunctiva pink. Ears:  Normal auditory acuity. Nose:  No deformity, discharge,  or lesions. Mouth:  No deformity or lesions, mucosa pink and moist.  Neck:  Supple, without mass or thyromegaly. Lungs:   Clear to auscultation bilaterally, without wheezing, rales, or rhonchi.  Heart:  S1, S2 present without murmurs noted.  Abdomen:  +BS, soft, non-tender and non-distended. Without mass or HSM. No rebound or guarding. No hernias noted. Rectal:  External non-thrombosed hemorrhoids/hemorrhoid tags. Internal exam without obvious mass, stricture, or abnormalities. No gross blood appreciated.  Msk:  Symmetrical without gross deformities. Normal posture. Extremities:  Without clubbing or edema. Neurologic:  Alert and  oriented x4;  grossly normal neurologically. Skin:  Intact, warm and dry without significant lesions or rashes Cervical Nodes:  No significant cervical adenopathy. Psych:  Alert and cooperative. Normal mood and affect.

## 2013-04-18 NOTE — Patient Instructions (Signed)
Stop Linzess for now. Start taking Amitiza 1 capsule WITH FOOD twice a day. I have provided samples.  Start taking a probiotic daily such as [employer], [place], [place], [place].   Start taking Prilosec once each morning, 30 minutes before breakfast. If the hiccups continue, we may need to pursue further work-up.  If your constipation does not improve, we may need more work-up as well; for right now, we will try Amitiza, a probiotic, and continue your high fiber diet as you are doing.  Return in 4 weeks; please call me if you have any problems or worsening of your symptoms before then!

## 2013-04-19 DIAGNOSIS — R066 Hiccough: Secondary | ICD-10-CM | POA: Insufficient documentation

## 2013-04-19 NOTE — Assessment & Plan Note (Signed)
53 year old female with chronic constipation since diagnosis of lung cancer several years ago in the setting of chronic narcotic use. She is tapering narcotic dosing to hopefully limit severity of constipation. Colonoscopy fairly up-to-date as of Dec 2012 per patient; will need to request records. Failure of Linzess currently, never feels completely productive with stools and notes "thin", pencil-shaped stool. No obvious mass or stricture on rectal exam. Abdominal exam benign. Will trial Amitiza 24 mcg BID and have her return in close follow-up in 4 weeks. Obtain op notes. Low threshold for early interval colonoscopy/flex sig if persistent constipation.

## 2013-04-19 NOTE — Assessment & Plan Note (Signed)
With eating. No dysphagia, reflux, early satiety, N/V. Trial Prilosec once daily. Consider further evaluation if persistent symptoms.

## 2013-04-22 NOTE — Progress Notes (Signed)
cc'd to pcp 

## 2013-04-23 ENCOUNTER — Encounter: Payer: BC Managed Care – PPO | Admitting: Physical Therapy

## 2013-04-25 ENCOUNTER — Encounter: Payer: BC Managed Care – PPO | Admitting: Physical Therapy

## 2013-05-02 LAB — TSH: TSH: 2.05 u[IU]/mL (ref 0.41–5.90)

## 2013-05-16 ENCOUNTER — Ambulatory Visit: Payer: BC Managed Care – PPO | Admitting: Gastroenterology

## 2013-05-22 ENCOUNTER — Other Ambulatory Visit: Payer: Self-pay | Admitting: Physician Assistant

## 2013-05-22 DIAGNOSIS — M542 Cervicalgia: Secondary | ICD-10-CM

## 2013-05-25 ENCOUNTER — Other Ambulatory Visit: Payer: BC Managed Care – PPO

## 2013-05-27 ENCOUNTER — Ambulatory Visit
Admission: RE | Admit: 2013-05-27 | Discharge: 2013-05-27 | Disposition: A | Discharge status: Home / Self Care | Payer: BC Managed Care – PPO | Source: Ambulatory Visit | Attending: Physician Assistant | Admitting: Physician Assistant

## 2013-05-27 DIAGNOSIS — M542 Cervicalgia: Secondary | ICD-10-CM

## 2013-06-05 NOTE — Progress Notes (Signed)
Need colonoscopy reports from [place] if available. Thanks! Also, patient was to follow-up in 4 weeks, but I see she cancelled that. Let's get her in for a non-urgent visit to follow-up on constipation as well.

## 2013-06-10 NOTE — Progress Notes (Signed)
Mailed pt a sign of release form so we can get records from [place].

## 2013-06-20 NOTE — Progress Notes (Signed)
Records are in your cart.

## 2013-06-20 NOTE — Progress Notes (Signed)
Requested Records from [facility].

## 2013-06-21 ENCOUNTER — Encounter (HOSPITAL_COMMUNITY): Payer: BC Managed Care – PPO | Admitting: Anesthesiology

## 2013-06-21 ENCOUNTER — Emergency Department (HOSPITAL_COMMUNITY): Payer: BC Managed Care – PPO

## 2013-06-21 ENCOUNTER — Encounter (HOSPITAL_COMMUNITY): Payer: Self-pay | Admitting: Emergency Medicine

## 2013-06-21 ENCOUNTER — Emergency Department (HOSPITAL_COMMUNITY)
Admission: EM | Admit: 2013-06-21 | Discharge: 2013-06-21 | Disposition: A | Discharge status: Home / Self Care | Payer: BC Managed Care – PPO | Attending: General Surgery | Admitting: General Surgery

## 2013-06-21 ENCOUNTER — Encounter (HOSPITAL_COMMUNITY)
Admission: EM | Disposition: A | Discharge status: Home / Self Care | Payer: Self-pay | Source: Home / Self Care | Attending: Emergency Medicine

## 2013-06-21 ENCOUNTER — Emergency Department (HOSPITAL_COMMUNITY): Payer: BC Managed Care – PPO | Admitting: Anesthesiology

## 2013-06-21 DIAGNOSIS — Z85118 Personal history of other malignant neoplasm of bronchus and lung: Secondary | ICD-10-CM | POA: Insufficient documentation

## 2013-06-21 DIAGNOSIS — K3580 Unspecified acute appendicitis: Secondary | ICD-10-CM

## 2013-06-21 DIAGNOSIS — F411 Generalized anxiety disorder: Secondary | ICD-10-CM | POA: Insufficient documentation

## 2013-06-21 DIAGNOSIS — I10 Essential (primary) hypertension: Secondary | ICD-10-CM | POA: Insufficient documentation

## 2013-06-21 DIAGNOSIS — K219 Gastro-esophageal reflux disease without esophagitis: Secondary | ICD-10-CM | POA: Insufficient documentation

## 2013-06-21 DIAGNOSIS — Z87891 Personal history of nicotine dependence: Secondary | ICD-10-CM | POA: Insufficient documentation

## 2013-06-21 HISTORY — PX: LAPAROSCOPIC APPENDECTOMY: SHX408

## 2013-06-21 LAB — URINALYSIS, ROUTINE W REFLEX MICROSCOPIC
BILIRUBIN URINE: NEGATIVE
Glucose, UA: NEGATIVE mg/dL
KETONES UR: NEGATIVE mg/dL
Leukocytes, UA: NEGATIVE
Nitrite: NEGATIVE
Protein, ur: NEGATIVE mg/dL
Specific Gravity, Urine: >1.03 — ABNORMAL HIGH (ref 1.005–1.030)
UROBILINOGEN UA: 0.2 mg/dL (ref 0.0–1.0)
pH: 5.5 (ref 5.0–8.0)

## 2013-06-21 LAB — CBC WITH DIFFERENTIAL/PLATELET
BASOS PCT: 0 % (ref 0–1)
Basophils Absolute: 0.1 10*3/uL (ref 0.0–0.1)
Eosinophils Absolute: 0.3 10*3/uL (ref 0.0–0.7)
Eosinophils Relative: 2 % (ref 0–5)
HCT: 38 % (ref 36.0–46.0)
HEMOGLOBIN: 12.4 g/dL (ref 12.0–15.0)
Lymphocytes Relative: 8 % — ABNORMAL LOW (ref 12–46)
Lymphs Abs: 1 10*3/uL (ref 0.7–4.0)
MCH: 29.5 pg (ref 26.0–34.0)
MCHC: 32.6 g/dL (ref 30.0–36.0)
MCV: 90.5 fL (ref 78.0–100.0)
Monocytes Absolute: 0.8 10*3/uL (ref 0.1–1.0)
Monocytes Relative: 6 % (ref 3–12)
NEUTROS PCT: 84 % — AB (ref 43–77)
Neutro Abs: 10.6 10*3/uL — ABNORMAL HIGH (ref 1.7–7.7)
Platelets: 313 10*3/uL (ref 150–400)
RBC: 4.2 MIL/uL (ref 3.87–5.11)
RDW: 12.4 % (ref 11.5–15.5)
WBC: 12.7 10*3/uL — ABNORMAL HIGH (ref 4.0–10.5)

## 2013-06-21 LAB — URINE MICROSCOPIC-ADD ON

## 2013-06-21 LAB — BASIC METABOLIC PANEL
BUN: 21 mg/dL (ref 6–23)
CO2: 26 mEq/L (ref 19–32)
Calcium: 9.1 mg/dL (ref 8.4–10.5)
Chloride: 99 mEq/L (ref 96–112)
Creatinine, Ser: 0.82 mg/dL (ref 0.50–1.10)
GFR calc non Af Amer: 80 mL/min — ABNORMAL LOW (ref >90)
Glucose, Bld: 123 mg/dL — ABNORMAL HIGH (ref 70–99)
POTASSIUM: 4 meq/L (ref 3.7–5.3)
Sodium: 139 mEq/L (ref 137–147)

## 2013-06-21 SURGERY — APPENDECTOMY, LAPAROSCOPIC
Anesthesia: General | Site: Abdomen

## 2013-06-21 MED ORDER — FENTANYL CITRATE 0.05 MG/ML IJ SOLN
INTRAMUSCULAR | Status: AC
  Filled 2013-06-21: qty 2

## 2013-06-21 MED ORDER — SODIUM CHLORIDE BACTERIOSTATIC 0.9 % IJ SOLN
INTRAMUSCULAR | Status: AC
  Filled 2013-06-21: qty 10

## 2013-06-21 MED ORDER — SODIUM CHLORIDE 0.9 % IV SOLN
3.0000 g | Freq: Once | INTRAVENOUS | Status: AC
  Administered 2013-06-21: 3 g via INTRAVENOUS
  Filled 2013-06-21: qty 3

## 2013-06-21 MED ORDER — NEOSTIGMINE METHYLSULFATE 10 MG/10ML IV SOLN
INTRAVENOUS | Status: AC
  Filled 2013-06-21: qty 1

## 2013-06-21 MED ORDER — LACTATED RINGERS IV SOLN
INTRAVENOUS | Status: DC | PRN
  Administered 2013-06-21: 13:00:00 via INTRAVENOUS

## 2013-06-21 MED ORDER — ENOXAPARIN SODIUM 40 MG/0.4ML ~~LOC~~ SOLN
40.0000 mg | Freq: Once | SUBCUTANEOUS | Status: AC
  Administered 2013-06-21: 40 mg via SUBCUTANEOUS
  Filled 2013-06-21: qty 0.4

## 2013-06-21 MED ORDER — ARTIFICIAL TEARS OP OINT
TOPICAL_OINTMENT | OPHTHALMIC | Status: DC | PRN
  Administered 2013-06-21: 1 via OPHTHALMIC

## 2013-06-21 MED ORDER — GLYCOPYRROLATE 0.2 MG/ML IJ SOLN
INTRAMUSCULAR | Status: DC | PRN
  Administered 2013-06-21: 0.6 mg via INTRAVENOUS

## 2013-06-21 MED ORDER — LIDOCAINE HCL (CARDIAC) 20 MG/ML IV SOLN
INTRAVENOUS | Status: DC | PRN
Start: 2013-06-21 — End: 2013-06-21
  Administered 2013-06-21: 50 mg via INTRAVENOUS

## 2013-06-21 MED ORDER — IOHEXOL 300 MG/ML  SOLN
100.0000 mL | Freq: Once | INTRAMUSCULAR | Status: AC | PRN
  Administered 2013-06-21: 100 mL via INTRAVENOUS

## 2013-06-21 MED ORDER — BUPIVACAINE HCL (PF) 0.5 % IJ SOLN
INTRAMUSCULAR | Status: DC | PRN
  Administered 2013-06-21: 10 mL

## 2013-06-21 MED ORDER — FENTANYL CITRATE 0.05 MG/ML IJ SOLN
INTRAMUSCULAR | Status: AC
  Filled 2013-06-21: qty 5

## 2013-06-21 MED ORDER — SODIUM CHLORIDE 0.9 % IV SOLN
1000.0000 mL | INTRAVENOUS | Status: DC
  Administered 2013-06-21: 1000 mL via INTRAVENOUS
  Administered 2013-06-21: 12:00:00 via INTRAVENOUS

## 2013-06-21 MED ORDER — MIDAZOLAM HCL 2 MG/2ML IJ SOLN
INTRAMUSCULAR | Status: AC
  Filled 2013-06-21: qty 2

## 2013-06-21 MED ORDER — NEOSTIGMINE METHYLSULFATE 10 MG/10ML IV SOLN
INTRAVENOUS | Status: DC | PRN
  Administered 2013-06-21: 4 mg via INTRAVENOUS

## 2013-06-21 MED ORDER — SUCCINYLCHOLINE CHLORIDE 20 MG/ML IJ SOLN
INTRAMUSCULAR | Status: DC | PRN
  Administered 2013-06-21: 100 mg via INTRAVENOUS

## 2013-06-21 MED ORDER — ONDANSETRON HCL 4 MG/2ML IJ SOLN
4.0000 mg | Freq: Once | INTRAMUSCULAR | Status: AC
  Administered 2013-06-21: 4 mg via INTRAMUSCULAR
  Filled 2013-06-21: qty 2

## 2013-06-21 MED ORDER — HEMOSTATIC AGENTS (NO CHARGE) OPTIME
TOPICAL | Status: DC | PRN
  Administered 2013-06-21: 1 via TOPICAL

## 2013-06-21 MED ORDER — MIDAZOLAM HCL 5 MG/5ML IJ SOLN
INTRAMUSCULAR | Status: DC | PRN
  Administered 2013-06-21: 2 mg via INTRAVENOUS

## 2013-06-21 MED ORDER — ONDANSETRON HCL 4 MG/2ML IJ SOLN
INTRAMUSCULAR | Status: DC | PRN
  Administered 2013-06-21: 4 mg via INTRAVENOUS

## 2013-06-21 MED ORDER — ROCURONIUM BROMIDE 100 MG/10ML IV SOLN
INTRAVENOUS | Status: DC | PRN
Start: 2013-06-21 — End: 2013-06-21
  Administered 2013-06-21: 20 mg via INTRAVENOUS
  Administered 2013-06-21: 10 mg via INTRAVENOUS

## 2013-06-21 MED ORDER — DEXAMETHASONE SODIUM PHOSPHATE 4 MG/ML IJ SOLN
INTRAMUSCULAR | Status: DC | PRN
  Administered 2013-06-21: 8 mg via INTRAVENOUS

## 2013-06-21 MED ORDER — SODIUM CHLORIDE 0.9 % IR SOLN
Status: DC | PRN
  Administered 2013-06-21: 1000 mL

## 2013-06-21 MED ORDER — HYDROCODONE-ACETAMINOPHEN 10-325 MG PO TABS: 1.0000 | ORAL_TABLET | Freq: Four times a day (QID) | ORAL | Status: DC

## 2013-06-21 MED ORDER — SODIUM CHLORIDE 0.9 % IV SOLN
1000.0000 mL | Freq: Once | INTRAVENOUS | Status: AC
  Administered 2013-06-21: 1000 mL via INTRAVENOUS

## 2013-06-21 MED ORDER — GLYCOPYRROLATE 0.2 MG/ML IJ SOLN
INTRAMUSCULAR | Status: AC
  Filled 2013-06-21: qty 3

## 2013-06-21 MED ORDER — PROPOFOL 10 MG/ML IV EMUL
INTRAVENOUS | Status: AC
  Filled 2013-06-21: qty 20

## 2013-06-21 MED ORDER — SODIUM CHLORIDE 0.9 % IJ SOLN
INTRAMUSCULAR | Status: AC
  Administered 2013-06-21: 11:00:00
  Filled 2013-06-21: qty 750

## 2013-06-21 MED ORDER — PROPOFOL 10 MG/ML IV BOLUS
INTRAVENOUS | Status: DC | PRN
  Administered 2013-06-21: 120 mg via INTRAVENOUS

## 2013-06-21 MED ORDER — BUPIVACAINE HCL (PF) 0.5 % IJ SOLN
INTRAMUSCULAR | Status: AC
  Filled 2013-06-21: qty 30

## 2013-06-21 MED ORDER — FENTANYL CITRATE 0.05 MG/ML IJ SOLN
INTRAMUSCULAR | Status: DC | PRN
  Administered 2013-06-21: 100 ug via INTRAVENOUS
  Administered 2013-06-21 (×9): 50 ug via INTRAVENOUS

## 2013-06-21 MED ORDER — KETOROLAC TROMETHAMINE 30 MG/ML IJ SOLN
30.0000 mg | Freq: Once | INTRAMUSCULAR | Status: AC
  Administered 2013-06-21: 30 mg via INTRAVENOUS
  Filled 2013-06-21: qty 1

## 2013-06-21 MED ORDER — ARTIFICIAL TEARS OP OINT
TOPICAL_OINTMENT | OPHTHALMIC | Status: AC
  Filled 2013-06-21: qty 3.5

## 2013-06-21 MED ORDER — MORPHINE SULFATE 4 MG/ML IJ SOLN
4.0000 mg | Freq: Once | INTRAMUSCULAR | Status: AC
  Administered 2013-06-21: 4 mg via INTRAVENOUS
  Filled 2013-06-21: qty 1

## 2013-06-21 MED ORDER — EPHEDRINE SULFATE 50 MG/ML IJ SOLN
INTRAMUSCULAR | Status: AC
  Filled 2013-06-21: qty 1

## 2013-06-21 MED ORDER — IOHEXOL 300 MG/ML  SOLN
50.0000 mL | Freq: Once | INTRAMUSCULAR | Status: AC | PRN
  Administered 2013-06-21: 50 mL via ORAL

## 2013-06-21 MED ORDER — ROCURONIUM BROMIDE 50 MG/5ML IV SOLN
INTRAVENOUS | Status: AC
  Filled 2013-06-21: qty 1

## 2013-06-21 MED ORDER — LIDOCAINE HCL (PF) 1 % IJ SOLN
INTRAMUSCULAR | Status: AC
  Filled 2013-06-21: qty 5

## 2013-06-21 MED ORDER — SUCCINYLCHOLINE CHLORIDE 20 MG/ML IJ SOLN
INTRAMUSCULAR | Status: AC
  Filled 2013-06-21: qty 1

## 2013-06-21 SURGICAL SUPPLY — 47 items
BAG HAMPER (MISCELLANEOUS) ×2 IMPLANT
CLOTH BEACON ORANGE TIMEOUT ST (SAFETY) ×2 IMPLANT
COVER LIGHT HANDLE STERIS (MISCELLANEOUS) ×4 IMPLANT
CUTTER FLEX LINEAR 45M (STAPLE) IMPLANT
CUTTER LINEAR ENDO 35 ETS (STAPLE) ×2 IMPLANT
CUTTER LINEAR ENDO 35 ETS TH (STAPLE) IMPLANT
DECANTER SPIKE VIAL GLASS SM (MISCELLANEOUS) ×2 IMPLANT
DISSECTOR BLUNT TIP ENDO 5MM (MISCELLANEOUS) IMPLANT
DURAPREP 26ML APPLICATOR (WOUND CARE) ×2 IMPLANT
ELECT REM PT RETURN 9FT ADLT (ELECTROSURGICAL) ×2
ELECTRODE REM PT RTRN 9FT ADLT (ELECTROSURGICAL) ×1 IMPLANT
FILTER SMOKE EVAC LAPAROSHD (FILTER) IMPLANT
FORMALIN 10 PREFIL 120ML (MISCELLANEOUS) ×2 IMPLANT
GLOVE BIOGEL PI IND STRL 7.5 (GLOVE) ×1 IMPLANT
GLOVE BIOGEL PI INDICATOR 7.5 (GLOVE) ×1
GLOVE ECLIPSE 7.0 STRL STRAW (GLOVE) ×2 IMPLANT
GLOVE EXAM NITRILE MD LF STRL (GLOVE) ×2 IMPLANT
GLOVE SURG SS PI 7.5 STRL IVOR (GLOVE) ×4 IMPLANT
GOWN STRL REUS W/TWL LRG LVL3 (GOWN DISPOSABLE) ×4 IMPLANT
HEMOSTAT SURGICEL 4X8 (HEMOSTASIS) ×2 IMPLANT
INST SET LAPROSCOPIC AP (KITS) ×2 IMPLANT
IV NS IRRIG 3000ML ARTHROMATIC (IV SOLUTION) IMPLANT
KIT ROOM TURNOVER APOR (KITS) ×2 IMPLANT
MANIFOLD NEPTUNE II (INSTRUMENTS) ×2 IMPLANT
NEEDLE INSUFFLATION 14GA 120MM (NEEDLE) ×2 IMPLANT
NS IRRIG 1000ML POUR BTL (IV SOLUTION) ×2 IMPLANT
PACK LAP CHOLE LZT030E (CUSTOM PROCEDURE TRAY) ×2 IMPLANT
PAD ARMBOARD 7.5X6 YLW CONV (MISCELLANEOUS) ×2 IMPLANT
PENCIL HANDSWITCHING (ELECTRODE) ×2 IMPLANT
POUCH SPECIMEN RETRIEVAL 10MM (ENDOMECHANICALS) ×2 IMPLANT
RELOAD /EVU35 (ENDOMECHANICALS) IMPLANT
RELOAD 45 VASCULAR/THIN (ENDOMECHANICALS) IMPLANT
RELOAD CUTTER ETS 35MM STAND (ENDOMECHANICALS) IMPLANT
SCALPEL HARMONIC ACE (MISCELLANEOUS) ×2 IMPLANT
SET BASIN LINEN APH (SET/KITS/TRAYS/PACK) ×2 IMPLANT
SET TUBE IRRIG SUCTION NO TIP (IRRIGATION / IRRIGATOR) IMPLANT
SPONGE GAUZE 2X2 8PLY STRL LF (GAUZE/BANDAGES/DRESSINGS) ×6 IMPLANT
STAPLER VISISTAT (STAPLE) ×2 IMPLANT
SUT VICRYL 0 UR6 27IN ABS (SUTURE) ×2 IMPLANT
TAPE CLOTH SURG 4X10 WHT LF (GAUZE/BANDAGES/DRESSINGS) ×2 IMPLANT
TRAY FOLEY CATH 16FR SILVER (SET/KITS/TRAYS/PACK) ×2 IMPLANT
TROCAR ENDO BLADELESS 11MM (ENDOMECHANICALS) ×2 IMPLANT
TROCAR ENDO BLADELESS 12MM (ENDOMECHANICALS) ×2 IMPLANT
TROCAR XCEL NON-BLD 5MMX100MML (ENDOMECHANICALS) ×2 IMPLANT
TUBING INSUFFLATION (TUBING) ×2 IMPLANT
WARMER LAPAROSCOPE (MISCELLANEOUS) ×2 IMPLANT
YANKAUER SUCT 12FT TUBE ARGYLE (SUCTIONS) ×2 IMPLANT

## 2013-06-21 NOTE — H&P (Signed)
[name] is an 53 y.o. female.   Chief Complaint: Right lower quadrant abdominal pain HPI: Patient is a 53 year old white female who began experiencing right lower quadrant abdominal pain 2 days ago. The pain worsened this morning and she presented emergency room for further evaluation treatment. CT scan of the abdomen reveals acute appendicitis without perforation.  Past Medical History  Diagnosis Date  . Lung cancer     pancoast tumor  . Hypertension   . Anxiety     Past Surgical History  Procedure Laterality Date  . Thoracotomy  [id number]    transsmanubrial anterolaterl thoracotomy with chest wall resection includinf the first rib and left  upper lobectomy for Pancoast turmor  . Portacath placement  [id number]    Rt   IJ  Port-A-Cath  dr.burney  . Tubal ligation    . Sternal wires removal  01/23/2012    Procedure: STERNAL WIRES REMOVAL;  Surgeon: Grace Isaac, MD;  Location: [place];  Service: Thoracic;  Laterality: N/A;  . Colonoscopy  12/2010    in [place]    Family History  Problem Relation Age of Onset  . Colon cancer Neg Hx    Social History:  reports that she quit smoking about 3 years ago. She has never used smokeless tobacco. She reports that she does not drink alcohol or use illicit drugs.  Allergies: No Known Allergies   (Not in a hospital admission)  Results for orders placed during the hospital encounter of 06/21/13 (from the past 48 hour(s))  URINALYSIS, ROUTINE W REFLEX MICROSCOPIC     Status: Abnormal   Collection Time    06/21/13  9:04 AM      Result Value Ref Range   Color, Urine YELLOW  YELLOW   APPearance CLEAR  CLEAR   Specific Gravity, Urine >1.030 (*) 1.005 - 1.030   pH 5.5  5.0 - 8.0   Glucose, UA NEGATIVE  NEGATIVE mg/dL   Hgb urine dipstick MODERATE (*) NEGATIVE   Bilirubin Urine NEGATIVE  NEGATIVE   Ketones, ur NEGATIVE  NEGATIVE mg/dL   Protein, ur NEGATIVE  NEGATIVE mg/dL   Urobilinogen, UA 0.2  0.0 - 1.0 mg/dL   Nitrite  NEGATIVE  NEGATIVE   Leukocytes, UA NEGATIVE  NEGATIVE  URINE MICROSCOPIC-ADD ON     Status: None   Collection Time    06/21/13  9:04 AM      Result Value Ref Range   RBC / HPF 7-10  <3 RBC/hpf  CBC WITH DIFFERENTIAL     Status: Abnormal   Collection Time    06/21/13  9:14 AM      Result Value Ref Range   WBC 12.7 (*) 4.0 - 10.5 K/uL   RBC 4.20  3.87 - 5.11 MIL/uL   Hemoglobin 12.4  12.0 - 15.0 g/dL   HCT 38.0  36.0 - 46.0 %   MCV 90.5  78.0 - 100.0 fL   MCH 29.5  26.0 - 34.0 pg   MCHC 32.6  30.0 - 36.0 g/dL   RDW 12.4  11.5 - 15.5 %   Platelets 313  150 - 400 K/uL   Neutrophils Relative % 84 (*) 43 - 77 %   Neutro Abs 10.6 (*) 1.7 - 7.7 K/uL   Lymphocytes Relative 8 (*) 12 - 46 %   Lymphs Abs 1.0  0.7 - 4.0 K/uL   Monocytes Relative 6  3 - 12 %   Monocytes Absolute 0.8  0.1 - 1.0 K/uL  Eosinophils Relative 2  0 - 5 %   Eosinophils Absolute 0.3  0.0 - 0.7 K/uL   Basophils Relative 0  0 - 1 %   Basophils Absolute 0.1  0.0 - 0.1 K/uL  BASIC METABOLIC PANEL     Status: Abnormal   Collection Time    06/21/13  9:14 AM      Result Value Ref Range   Sodium 139  137 - 147 mEq/L   Potassium 4.0  3.7 - 5.3 mEq/L   Chloride 99  96 - 112 mEq/L   CO2 26  19 - 32 mEq/L   Glucose, Bld 123 (*) 70 - 99 mg/dL   BUN 21  6 - 23 mg/dL   Creatinine, Ser 0.82  0.50 - 1.10 mg/dL   Calcium 9.1  8.4 - 10.5 mg/dL   GFR calc non Af Amer 80 (*) >90 mL/min   GFR calc Af Amer >90  >90 mL/min   Comment: (NOTE)     The eGFR has been calculated using the CKD EPI equation.     This calculation has not been validated in all clinical situations.     eGFR's persistently <90 mL/min signify possible Chronic Kidney     Disease.   Ct Abdomen Pelvis W Contrast  06/21/2013   CLINICAL DATA:  Lower abdominal pain  EXAM: CT ABDOMEN AND PELVIS WITH CONTRAST  TECHNIQUE: Multidetector CT imaging of the abdomen and pelvis was performed using the standard protocol following bolus administration of intravenous  contrast. Oral contrast was also administered.  CONTRAST:  122mL OMNIPAQUE IOHEXOL 300 MG/ML  SOLN  COMPARISON:  None.  FINDINGS: Lung bases are clear.  Liver is enlarged, measuring 19.0 cm in length. No focal liver lesions are identified. There is, however, mild fatty change in the fissure for the ligamentum teres. There is cholelithiasis. Gallbladder wall is not thickened. The common bile duct appears upper normal in size without mass or calculus.  Spleen, pancreas, and adrenals appear within normal limits. Kidneys bilaterally show no mass, calculus, or hydronephrosis. There is no ureteral calculus or ureterectasis on either side.  In the pelvis, the area bladder is midline with normal wall thickness. There is no pelvic mass or fluid collection.  The appendix is dilated with surrounding inflammation consistent with acute appendicitis. No frank abscess is seen. Mild thickening of the wall of the cecum is probably due to the adjacent appendicitis. The terminal ileum shows mild wall thickening, probably due to the nearby appendicitis.  There is no bowel obstruction.  No free air or portal venous air.  There is no ascites or abscess in the abdomen or pelvis.  There are several small periaortic lymph nodes, largest measuring 1.3 x 0.8 cm. There is a retrocrural lymph node on the right at the level of the gastroesophageal junction measuring 1.2 x 1.1 cm. No other lymph node prominence is appreciated.  There is atherosclerotic change in the aorta but no aneurysm. There are no blastic or lytic bone lesions.  IMPRESSION: Evidence of acute appendicitis without frank abscess.  Several small periaortic lymph nodes. There is a borderline prominent right retrocrural lymph node. These lymph nodes may well have reactive etiology, although conceivably lymphoma could present in this manner.  There is hepatomegaly with mild fatty infiltration in the liver. There is cholelithiasis.  Mild thickening of the wall of terminal ileum is  probably due to the adjacent appendicitis. No fistula seen.  Critical Value/emergent results were called by telephone at the time of  interpretation on 06/21/2013 at 11:05 AM to Dr. Rolland Porter , who verbally acknowledged these results.   Electronically Signed   By: Lowella Grip M.D.   On: 06/21/2013 11:05    Review of Systems  Constitutional: Positive for malaise/fatigue.  HENT: Negative.   Eyes: Negative.   Respiratory: Negative.   Cardiovascular: Negative.   Gastrointestinal: Positive for nausea and abdominal pain.  Genitourinary: Negative.   Musculoskeletal: Negative.   Skin: Negative.   All other systems reviewed and are negative.   Blood pressure 133/67, pulse 107, temperature 98.5 F (36.9 C), temperature source Oral, resp. rate 18, height $RemoveBe'5\' 4"'HUVAnIqXP$  (1.626 m), weight 64.411 kg (142 lb), SpO2 97.00%. Physical Exam  Constitutional: She is oriented to person, place, and time. She appears well-developed and well-nourished.  HENT:  Head: Normocephalic and atraumatic.  Neck: Normal range of motion. Neck supple.  Cardiovascular: Normal rate, regular rhythm and normal heart sounds.   Respiratory: Effort normal and breath sounds normal.  GI: Soft. She exhibits no distension. There is tenderness.  Tender in the right lower quadrant to palpation. No rigidity is noted.  Neurological: She is alert and oriented to person, place, and time.  Skin: Skin is warm and dry.     Assessment/Plan Impression: Acute appendicitis Plan: Patient returns to the operating room for laparoscopic appendectomy. The risks and benefits of the procedure including bleeding, infection, and the possibility of an open procedure were fully explained to the patient, who gave informed consent.  Jamesetta So 06/21/2013, 11:35 AM

## 2013-06-21 NOTE — ED Notes (Signed)
Complain of abdominal pain and constipation since Wednesday. States she went to her doctor today and was sent here for evaluation for possible appendicitis

## 2013-06-21 NOTE — ED Provider Notes (Signed)
CSN: [id number]     Arrival date & time 06/21/13  7371 History  This chart was scribed for Janice Norrie, MD by Roe Coombs, ED Scribe. The patient was seen in room APA01/APA01. Patient's care was started at 9:03 AM.  Chief Complaint  Patient presents with  . Abdominal Pain    The history is provided by the patient. No language interpreter was used.    HPI Comments: [name] is a 53 y.o. female with history of lung cancer and hypertension who presents to the Emergency Department complaining of sharp, constant, right lower abdominal pain that began 2 days ago and has worsened today. Pain is worse with movement and improved mildly when she lies on her right side or hold pressure on it. Patient has frequent constipation due to pain medicines that she takes. She had an enema on Wednesday,2 days ago and yesterday, that improved her constipation, but her pain continued. She went to her PCP today who sent her to the ED for further evaluation and to rule out appendicits. She takes 4 tablets of hydrocodone-acetaminophen 10-325 mg every day for chronic back pain, but this has not helped her abdominal pain. There is associated nausea, decreased appetite, and fever. She states that her temperature at home this morning was 100.5 and at her PCP's office it was 100.0. She denies vomiting, dysuria, and difficulty urinating. She has a surgical history of tubal ligation.   PCP - [name]   Past Medical History  Diagnosis Date  . Lung cancer     pancoast tumor  . Hypertension   . Anxiety    Past Surgical History  Procedure Laterality Date  . Thoracotomy  [id number]    transsmanubrial anterolaterl thoracotomy with chest wall resection includinf the first rib and left  upper lobectomy for Pancoast turmor  . Portacath placement  [id number]    Rt   IJ  Port-A-Cath  dr.burney  . Tubal ligation    . Sternal wires removal  01/23/2012    Procedure: STERNAL WIRES REMOVAL;  Surgeon: Grace Isaac, MD;   Location: [place];  Service: Thoracic;  Laterality: N/A;  . Colonoscopy  12/2010    in [place]   Family History  Problem Relation Age of Onset  . Colon cancer Neg Hx    History  Substance Use Topics  . Smoking status: Former Smoker    Quit date: 02/04/2010  . Smokeless tobacco: Never Used  . 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  Her daughter-in-law had a baby on Tuesday, 3 days ago. Lives with husband   OB History   Grav Para Term Preterm Abortions TAB SAB Ect Mult Living                 Review of Systems  Constitutional: Positive for fever and appetite change.  Gastrointestinal: Positive for nausea and abdominal pain. Negative for vomiting.  Genitourinary: Negative for dysuria and difficulty urinating.  All other systems reviewed and are negative.   Allergies  Review of patient's allergies indicates no known allergies.  Home Medications   Prior to Admission medications   Medication Sig Start Date End Date Taking? Authorizing Provider  ALPRAZolam Duanne Moron) 0.5 MG tablet Take 0.5 mg by mouth 2 (two) times daily as needed for anxiety.   Yes Historical Provider, MD  Cholecalciferol (VITAMIN D3) 5000 UNITS CAPS Take 1 capsule by mouth daily.    Yes Historical Provider, MD  DULoxetine (CYMBALTA) 30 MG capsule Take 30  mg by mouth 3 (three) times daily.   Yes Historical Provider, MD  HYDROcodone-acetaminophen (NORCO) 10-325 MG per tablet Take 1 tablet by mouth 4 (four) times daily. Radiation pain 09/02/11  Yes Historical Provider, MD  lisinopril (PRINIVIL,ZESTRIL) 40 MG tablet Take 20 mg by mouth at bedtime.    Yes Historical Provider, MD  LORazepam (ATIVAN) 0.5 MG tablet Take 0.25 mg by mouth at bedtime.    Yes Historical Provider, MD  lubiprostone (AMITIZA) 24 MCG capsule Take 24 mcg by mouth 2 (two) times daily with a meal. 04/18/13  Yes Orvil Feil, NP  meloxicam (MOBIC) 7.5 MG tablet Take 7.5 mg by mouth at bedtime. 06/12/13  Yes Historical Provider, MD   traZODone (DESYREL) 50 MG tablet Take 50 mg by mouth at bedtime.   Yes Historical Provider, MD   Triage Vitals: BP 130/71  Pulse 78  Temp(Src) 98.5 F (36.9 C) (Oral)  Resp 18  Ht 5\' 4"  (1.626 m)  Wt 142 lb (64.411 kg)  BMI 24.36 kg/m2  SpO2 98%  Vital signs normal   Physical Exam  Nursing note and vitals reviewed. Constitutional: She is oriented to person, place, and time. She appears well-developed and well-nourished.  Non-toxic appearance. She does not appear ill. No distress.  HENT:  Head: Normocephalic and atraumatic.  Right Ear: External ear normal.  Left Ear: External ear normal.  Nose: Nose normal. No mucosal edema or rhinorrhea.  Mouth/Throat: Oropharynx is clear and moist and mucous membranes are normal. No dental abscesses or uvula swelling.  Eyes: Conjunctivae and EOM are normal. Pupils are equal, round, and reactive to light.  Neck: Normal range of motion and full passive range of motion without pain. Neck supple.  Cardiovascular: Normal rate, regular rhythm and normal heart sounds.  Exam reveals no gallop and no friction rub.   No murmur heard. Pulmonary/Chest: Effort normal and breath sounds normal. No respiratory distress. She has no wheezes. She has no rhonchi. She has no rales. She exhibits no tenderness and no crepitus.  Abdominal: Soft. Normal appearance and bowel sounds are normal. She exhibits no distension. There is tenderness. There is rebound. There is no guarding.  RLQ tenderness to palpation with some guarding. Palpation of the RUQ radiates pain into the RLQ. No pain to knee tapping. No Psoas sign.  Musculoskeletal: Normal range of motion. She exhibits no edema and no tenderness.  Moves all extremities well.   Neurological: She is alert and oriented to person, place, and time. She has normal strength. No cranial nerve deficit.  Skin: Skin is warm, dry and intact. No rash noted. No erythema. No pallor.  Psychiatric: She has a normal mood and affect. Her  speech is normal and behavior is normal. Her mood appears not anxious.    ED Course  Procedures (including critical care time)  Medications  0.9 %  sodium chloride infusion (1,000 mLs Intravenous New Bag/Given 06/21/13 0938)    Followed by  0.9 %  sodium chloride infusion (not administered)  sodium chloride 0.9 % injection (not administered)  Ampicillin-Sulbactam (UNASYN) 3 g in sodium chloride 0.9 % 100 mL IVPB (not administered)  morphine 4 MG/ML injection 4 mg (4 mg Intravenous Given 06/21/13 0935)  ondansetron (ZOFRAN) injection 4 mg (4 mg Intramuscular Given 06/21/13 0938)  iohexol (OMNIPAQUE) 300 MG/ML solution 50 mL (50 mLs Oral Contrast Given 06/21/13 0930)  iohexol (OMNIPAQUE) 300 MG/ML solution 100 mL (100 mLs Intravenous Contrast Given 06/21/13 1038)    DIAGNOSTIC STUDIES: Oxygen Saturation is  98% on room air, normal by my interpretation.    COORDINATION OF CARE: 9:13 AM- Will give IV fluids, morphine, and Zofran. Will order CBC with diff, BMP, UA, and CT of abdomen. Patient informed of current plan for treatment and evaluation and agrees with plan at this time.   11:03 Radiologist, Dr Jasmine December, called AP CT results  + Appendicitis  11:22 Spoke with Dr Arnoldo Morale who is going to take patient to the OR this morning.  Labs Review Results for orders placed during the hospital encounter of 06/21/13  CBC WITH DIFFERENTIAL      Result Value Ref Range   WBC 12.7 (*) 4.0 - 10.5 K/uL   RBC 4.20  3.87 - 5.11 MIL/uL   Hemoglobin 12.4  12.0 - 15.0 g/dL   HCT 38.0  36.0 - 46.0 %   MCV 90.5  78.0 - 100.0 fL   MCH 29.5  26.0 - 34.0 pg   MCHC 32.6  30.0 - 36.0 g/dL   RDW 12.4  11.5 - 15.5 %   Platelets 313  150 - 400 K/uL   Neutrophils Relative % 84 (*) 43 - 77 %   Neutro Abs 10.6 (*) 1.7 - 7.7 K/uL   Lymphocytes Relative 8 (*) 12 - 46 %   Lymphs Abs 1.0  0.7 - 4.0 K/uL   Monocytes Relative 6  3 - 12 %   Monocytes Absolute 0.8  0.1 - 1.0 K/uL   Eosinophils Relative 2  0 - 5 %    Eosinophils Absolute 0.3  0.0 - 0.7 K/uL   Basophils Relative 0  0 - 1 %   Basophils Absolute 0.1  0.0 - 0.1 K/uL  BASIC METABOLIC PANEL      Result Value Ref Range   Sodium 139  137 - 147 mEq/L   Potassium 4.0  3.7 - 5.3 mEq/L   Chloride 99  96 - 112 mEq/L   CO2 26  19 - 32 mEq/L   Glucose, Bld 123 (*) 70 - 99 mg/dL   BUN 21  6 - 23 mg/dL   Creatinine, Ser 0.82  0.50 - 1.10 mg/dL   Calcium 9.1  8.4 - 10.5 mg/dL   GFR calc non Af Amer 80 (*) >90 mL/min   GFR calc Af Amer >90  >90 mL/min  URINALYSIS, ROUTINE W REFLEX MICROSCOPIC      Result Value Ref Range   Color, Urine YELLOW  YELLOW   APPearance CLEAR  CLEAR   Specific Gravity, Urine >1.030 (*) 1.005 - 1.030   pH 5.5  5.0 - 8.0   Glucose, UA NEGATIVE  NEGATIVE mg/dL   Hgb urine dipstick MODERATE (*) NEGATIVE   Bilirubin Urine NEGATIVE  NEGATIVE   Ketones, ur NEGATIVE  NEGATIVE mg/dL   Protein, ur NEGATIVE  NEGATIVE mg/dL   Urobilinogen, UA 0.2  0.0 - 1.0 mg/dL   Nitrite NEGATIVE  NEGATIVE   Leukocytes, UA NEGATIVE  NEGATIVE  URINE MICROSCOPIC-ADD ON      Result Value Ref Range   RBC / HPF 7-10  <3 RBC/hpf    Laboratory interpretation all normal except for leukocytosis, concentrated urine   Imaging Review Ct Abdomen Pelvis W Contrast  06/21/2013   CLINICAL DATA:  Lower abdominal pain  EXAM: CT ABDOMEN AND PELVIS WITH CONTRAST  TECHNIQUE: Multidetector CT imaging of the abdomen and pelvis was performed using the standard protocol following bolus administration of intravenous contrast. Oral contrast was also administered.  CONTRAST:  124mL OMNIPAQUE IOHEXOL 300  MG/ML  SOLN  COMPARISON:  None.  FINDINGS: Lung bases are clear.  Liver is enlarged, measuring 19.0 cm in length. No focal liver lesions are identified. There is, however, mild fatty change in the fissure for the ligamentum teres. There is cholelithiasis. Gallbladder wall is not thickened. The common bile duct appears upper normal in size without mass or calculus.   Spleen, pancreas, and adrenals appear within normal limits. Kidneys bilaterally show no mass, calculus, or hydronephrosis. There is no ureteral calculus or ureterectasis on either side.  In the pelvis, the area bladder is midline with normal wall thickness. There is no pelvic mass or fluid collection.  The appendix is dilated with surrounding inflammation consistent with acute appendicitis. No frank abscess is seen. Mild thickening of the wall of the cecum is probably due to the adjacent appendicitis. The terminal ileum shows mild wall thickening, probably due to the nearby appendicitis.  There is no bowel obstruction.  No free air or portal venous air.  There is no ascites or abscess in the abdomen or pelvis.  There are several small periaortic lymph nodes, largest measuring 1.3 x 0.8 cm. There is a retrocrural lymph node on the right at the level of the gastroesophageal junction measuring 1.2 x 1.1 cm. No other lymph node prominence is appreciated.  There is atherosclerotic change in the aorta but no aneurysm. There are no blastic or lytic bone lesions.  IMPRESSION: Evidence of acute appendicitis without frank abscess.  Several small periaortic lymph nodes. There is a borderline prominent right retrocrural lymph node. These lymph nodes may well have reactive etiology, although conceivably lymphoma could present in this manner.  There is hepatomegaly with mild fatty infiltration in the liver. There is cholelithiasis.  Mild thickening of the wall of terminal ileum is probably due to the adjacent appendicitis. No fistula seen.  Critical Value/emergent results were called by telephone at the time of interpretation on 06/21/2013 at 11:05 AM to Dr. Rolland Porter , who verbally acknowledged these results.   Electronically Signed   By: Lowella Grip M.D.   On: 06/21/2013 11:05     MDM   Final diagnoses:  Acute appendicitis    Plan admission  Rolland Porter, MD, FACEP   I personally performed the services  described in this documentation, which was scribed in my presence. The recorded information has been reviewed and considered.  Rolland Porter, MD, Abram Sander    Janice Norrie, MD 06/21/13 1146

## 2013-06-21 NOTE — Addendum Note (Signed)
Addendum created 06/21/13 1405 by Mickel Baas, CRNA   Modules edited: Anesthesia Events, Anesthesia Medication Administration

## 2013-06-21 NOTE — Anesthesia Preprocedure Evaluation (Signed)
Anesthesia Evaluation  Patient identified by MRN, date of birth, ID band Patient awake    Reviewed: Allergy & Precautions, H&P , NPO status , Patient's Chart, lab work & pertinent test results  History of Anesthesia Complications (+) DIFFICULT AIRWAY  Airway Mallampati: II TM Distance: >3 FB Neck ROM: full    Dental  (+) Teeth Intact   Pulmonary former smoker,  History of lung cancer with tumor resection breath sounds clear to auscultation        Cardiovascular Exercise Tolerance: Good hypertension, Rhythm:regular     Neuro/Psych    GI/Hepatic GERD-  ,  Endo/Other    Renal/GU      Musculoskeletal   Abdominal   Peds  Hematology   Anesthesia Other Findings   Reproductive/Obstetrics negative OB ROS                           Anesthesia Physical Anesthesia Plan  ASA: III and emergent  Anesthesia Plan: General ETT, Rapid Sequence and Cricoid Pressure   Post-op Pain Management:    Induction:   Airway Management Planned:   Additional Equipment:   Intra-op Plan:   Post-operative Plan:   Informed Consent:   Dental Advisory Given  Plan Discussed with: Surgeon and Anesthesiologist  Anesthesia Plan Comments:         Anesthesia Quick Evaluation

## 2013-06-21 NOTE — Anesthesia Postprocedure Evaluation (Signed)
  Anesthesia Post-op Note  Patient: [name]  Procedure(s) Performed: Procedure(s): APPENDECTOMY LAPAROSCOPIC (N/A)  Patient Location: PACU  Anesthesia Type:General  Level of Consciousness: awake, alert , oriented and patient cooperative  Airway and Oxygen Therapy: Patient Spontanous Breathing and Patient connected to face mask oxygen  Post-op Pain: mild  Post-op Assessment: Post-op Vital signs reviewed, Patient's Cardiovascular Status Stable, Respiratory Function Stable, Patent Airway, No signs of Nausea or vomiting and Pain level controlled  Post-op Vital Signs: Reviewed and stable  Last Vitals:  Filed Vitals:   06/21/13 1343  BP: 132/79  Pulse: 124  Temp: 37.1 C  Resp: 17    Complications: No apparent anesthesia complications

## 2013-06-21 NOTE — Anesthesia Procedure Notes (Addendum)
Procedure Name: Intubation Date/Time: 06/21/2013 12:51 PM Performed by: Andree Elk, Taji Barretto A Pre-anesthesia Checklist: Patient identified, Patient being monitored, Timeout performed, Emergency Drugs available and Suction available Patient Re-evaluated:Patient Re-evaluated prior to inductionOxygen Delivery Method: Circle System Utilized Preoxygenation: Pre-oxygenation with 100% oxygen Intubation Type: IV induction, Rapid sequence and Cricoid Pressure applied Laryngoscope Size: 3 and Miller Grade View: Grade I Tube type: Oral Tube size: 7.0 mm Number of attempts: 1 Airway Equipment and Method: stylet Placement Confirmation: ETT inserted through vocal cords under direct vision,  positive ETCO2 and breath sounds checked- equal and bilateral Secured at: 21 cm Tube secured with: Tape Dental Injury: Teeth and Oropharynx as per pre-operative assessment     `

## 2013-06-21 NOTE — Transfer of Care (Signed)
Immediate Anesthesia Transfer of Care Note  Patient: [name]  Procedure(s) Performed: Procedure(s): APPENDECTOMY LAPAROSCOPIC (N/A)  Patient Location: PACU  Anesthesia Type:General  Level of Consciousness: awake, alert , oriented and patient cooperative  Airway & Oxygen Therapy: Patient Spontanous Breathing and Patient connected to face mask oxygen  Post-op Assessment: Report given to PACU RN and Post -op Vital signs reviewed and stable  Post vital signs: Reviewed and stable  Complications: No apparent anesthesia complications

## 2013-06-21 NOTE — Discharge Instructions (Signed)
Laparoscopic Appendectomy Care After Refer to this sheet in the next few weeks. These instructions provide you with information on caring for yourself after your procedure. Your caregiver may also give you more specific instructions. Your treatment has been planned according to current medical practices, but problems sometimes occur. Call your caregiver if you have any problems or questions after your procedure. HOME CARE INSTRUCTIONS  Do not drive while taking narcotic pain medicines.  Use stool softener if you become constipated from your pain medicines.  Change your bandages (dressings) as directed.  Keep your wounds clean and dry. You may wash the wounds gently with soap and water. Gently pat the wounds dry with a clean towel.  Do not take baths, swim, or use hot tubs for 10 days, or as instructed by your caregiver.  Only take over-the-counter or prescription medicines for pain, discomfort, or fever as directed by your caregiver.  You may continue your normal diet as directed.  Do not lift more than 10 pounds (4.5 kg) or play contact sports for 3 weeks, or as directed.  Slowly increase your activity after surgery.  Take deep breaths to avoid getting a lung infection (pneumonia). SEEK MEDICAL CARE IF:  You have redness, swelling, or increasing pain in your wounds.  You have pus coming from your wounds.  You have drainage from a wound that lasts longer than 1 day.  You notice a bad smell coming from the wounds or dressing.  Your wound edges break open after stitches (sutures) have been removed.  You notice increasing pain in the shoulders (shoulder strap areas) or near your shoulder blades.  You develop dizzy episodes or fainting while standing.  You develop shortness of breath.  You develop persistent nausea or vomiting.  You cannot control your bowel functions or lose your appetite.  You develop diarrhea. SEEK IMMEDIATE MEDICAL CARE IF:   You have a fever.  You  develop a rash.  You have difficulty breathing or sharp pains in your chest.  You develop any reaction or side effects to medicines given. MAKE SURE YOU:  Understand these instructions.  Will watch your condition.  Will get help right away if you are not doing well or get worse. Document Released: 01/10/2005 Document Revised: 04/04/2011 Document Reviewed: 07/20/2010 [facility] Patient Information 2014 [place], [place].

## 2013-06-21 NOTE — ED Notes (Signed)
Started having pain Wednesday, thought it was constipation, but has had BM's since then, normal consistency, still have sharp pain RLQ, rated 10, guarding abdomen, pain is constant and is getting worse, some nausea, no vomiting

## 2013-06-21 NOTE — Op Note (Signed)
Patient:  [name]  DOB:  [birth year]  MRN:  [id number]   Preop Diagnosis:  Acute appendicitis  Postop Diagnosis:  Same  Procedure:  Laparoscopic appendectomy  Surgeon:  Aviva Signs, M.D.  Anes:  General endotracheal  Indications:  Patient is a 53 year old white female presented emergency room with worsening right lower quadrant abdominal pain. She was found to have acute appendicitis by CAT scan. The risks and benefits of the procedure including bleeding, infection, and the possibility of an open procedure were fully explained to the patient, who gave informed consent.  Procedure note:  The patient is placed the supine position. After induction of general endotracheal anesthesia, the abdomen was prepped and draped using usual sterile technique with DuraPrep. Surgical site confirmation was performed.  A supraumbilical incision was made down to the fascia. A Veress needle was introduced into the abdominal cavity and confirmation of placement was done using the saline drop test. The abdomen was then insufflated to 16 mm mercury pressure. An 11 mm trocar was introduced into the abdominal cavity under direct visualization without difficulty. The patient was placed in the Trendelenburg position and an additional 12 mm trocar was placed the suprapubic region and a 5 mm trocar was placed left lower quadrant region. The appendix was visualized and noted to be in bed in the mesentery of the ileocecal region. He was freed away using the harmonic scalpel. A standard Endo GIA was placed across the base the Tenex and fired. The appendix was then removed using an Endo Catch bag without difficulty. Sent to pathology further examination. The staple line was inspected and noted within normal limits. Surgicel was placed across the raw area of the mesentery. All fluid and air were then evacuated from the right lower quadrant of the abdomen prior to removal of the trochars.  All wounds were irrigated with  normal saline. All wounds were injected with 0.5% Sensorcaine. The supraumbilical fascia as well as suprapubic fascia were reapproximated using 0 Vicryl interrupted suture. All skin incisions were closed using staples. Betadine ointment and dry sterile dressings were applied.  All tape and needle counts were correct at the end of the procedure. Patient was extubated in the operating room and transferred to PACU in stable condition.  Complications:  None  EBL:  Minimal  Specimen:  Appendix

## 2013-09-08 ENCOUNTER — Encounter (HOSPITAL_COMMUNITY)
Admission: EM | Disposition: A | Discharge status: Home / Self Care | Payer: Self-pay | Source: Home / Self Care | Attending: Emergency Medicine

## 2013-09-08 ENCOUNTER — Emergency Department (HOSPITAL_COMMUNITY)
Admission: EM | Admit: 2013-09-08 | Discharge: 2013-09-08 | Disposition: A | Discharge status: Home / Self Care | Payer: BC Managed Care – PPO | Attending: Emergency Medicine | Admitting: Emergency Medicine

## 2013-09-08 ENCOUNTER — Encounter (HOSPITAL_COMMUNITY): Payer: Self-pay | Admitting: Emergency Medicine

## 2013-09-08 DIAGNOSIS — IMO0002 Reserved for concepts with insufficient information to code with codable children: Secondary | ICD-10-CM | POA: Insufficient documentation

## 2013-09-08 DIAGNOSIS — K449 Diaphragmatic hernia without obstruction or gangrene: Secondary | ICD-10-CM | POA: Insufficient documentation

## 2013-09-08 DIAGNOSIS — Z923 Personal history of irradiation: Secondary | ICD-10-CM | POA: Insufficient documentation

## 2013-09-08 DIAGNOSIS — F411 Generalized anxiety disorder: Secondary | ICD-10-CM | POA: Insufficient documentation

## 2013-09-08 DIAGNOSIS — Z87891 Personal history of nicotine dependence: Secondary | ICD-10-CM | POA: Diagnosis not present

## 2013-09-08 DIAGNOSIS — Z79899 Other long term (current) drug therapy: Secondary | ICD-10-CM | POA: Insufficient documentation

## 2013-09-08 DIAGNOSIS — I10 Essential (primary) hypertension: Secondary | ICD-10-CM | POA: Diagnosis not present

## 2013-09-08 DIAGNOSIS — K5900 Constipation, unspecified: Secondary | ICD-10-CM | POA: Insufficient documentation

## 2013-09-08 DIAGNOSIS — Y929 Unspecified place or not applicable: Secondary | ICD-10-CM | POA: Insufficient documentation

## 2013-09-08 DIAGNOSIS — Q391 Atresia of esophagus with tracheo-esophageal fistula: Secondary | ICD-10-CM | POA: Diagnosis not present

## 2013-09-08 DIAGNOSIS — Z85118 Personal history of other malignant neoplasm of bronchus and lung: Secondary | ICD-10-CM | POA: Insufficient documentation

## 2013-09-08 DIAGNOSIS — K222 Esophageal obstruction: Secondary | ICD-10-CM

## 2013-09-08 DIAGNOSIS — T18128A Food in esophagus causing other injury, initial encounter: Secondary | ICD-10-CM

## 2013-09-08 DIAGNOSIS — Q393 Congenital stenosis and stricture of esophagus: Secondary | ICD-10-CM | POA: Diagnosis not present

## 2013-09-08 DIAGNOSIS — T18108A Unspecified foreign body in esophagus causing other injury, initial encounter: Secondary | ICD-10-CM | POA: Diagnosis present

## 2013-09-08 HISTORY — PX: FOREIGN BODY REMOVAL: SHX962

## 2013-09-08 LAB — BASIC METABOLIC PANEL
Anion gap: 10 (ref 5–15)
BUN: 22 mg/dL (ref 6–23)
CHLORIDE: 102 meq/L (ref 96–112)
CO2: 29 mEq/L (ref 19–32)
Calcium: 9.7 mg/dL (ref 8.4–10.5)
Creatinine, Ser: 0.8 mg/dL (ref 0.50–1.10)
GFR calc Af Amer: >90 mL/min (ref >90)
GFR calc non Af Amer: 82 mL/min — ABNORMAL LOW (ref >90)
GLUCOSE: 101 mg/dL — AB (ref 70–99)
POTASSIUM: 4 meq/L (ref 3.7–5.3)
Sodium: 141 mEq/L (ref 137–147)

## 2013-09-08 LAB — CBC WITH DIFFERENTIAL/PLATELET
Basophils Absolute: 0.1 10*3/uL (ref 0.0–0.1)
Basophils Relative: 1 % (ref 0–1)
Eosinophils Absolute: 0.6 10*3/uL (ref 0.0–0.7)
Eosinophils Relative: 9 % — ABNORMAL HIGH (ref 0–5)
HCT: 39.4 % (ref 36.0–46.0)
Hemoglobin: 12.9 g/dL (ref 12.0–15.0)
LYMPHS ABS: 1.4 10*3/uL (ref 0.7–4.0)
LYMPHS PCT: 20 % (ref 12–46)
MCH: 29.3 pg (ref 26.0–34.0)
MCHC: 32.7 g/dL (ref 30.0–36.0)
MCV: 89.3 fL (ref 78.0–100.0)
Monocytes Absolute: 0.5 10*3/uL (ref 0.1–1.0)
Monocytes Relative: 7 % (ref 3–12)
NEUTROS ABS: 4.4 10*3/uL (ref 1.7–7.7)
Neutrophils Relative %: 63 % (ref 43–77)
Platelets: 329 10*3/uL (ref 150–400)
RBC: 4.41 MIL/uL (ref 3.87–5.11)
RDW: 12.8 % (ref 11.5–15.5)
WBC: 7.1 10*3/uL (ref 4.0–10.5)

## 2013-09-08 SURGERY — CANCELLED PROCEDURE

## 2013-09-08 SURGERY — REMOVAL, FOREIGN BODY
Anesthesia: Moderate Sedation

## 2013-09-08 MED ORDER — MIDAZOLAM HCL 5 MG/5ML IJ SOLN
INTRAMUSCULAR | Status: AC
  Filled 2013-09-08: qty 10

## 2013-09-08 MED ORDER — MIDAZOLAM HCL 5 MG/5ML IJ SOLN
INTRAMUSCULAR | Status: DC | PRN
  Administered 2013-09-08 (×2): 2 mg via INTRAVENOUS

## 2013-09-08 MED ORDER — GLUCAGON HCL RDNA (DIAGNOSTIC) 1 MG IJ SOLR
1.0000 mg | Freq: Once | INTRAMUSCULAR | Status: AC
  Administered 2013-09-08: 1 mg via INTRAVENOUS
  Filled 2013-09-08: qty 1

## 2013-09-08 MED ORDER — BUTAMBEN-TETRACAINE-BENZOCAINE 2-2-14 % EX AERO
INHALATION_SPRAY | CUTANEOUS | Status: DC | PRN
  Administered 2013-09-08: 2 via TOPICAL

## 2013-09-08 MED ORDER — ONDANSETRON HCL 4 MG/2ML IJ SOLN
INTRAMUSCULAR | Status: DC | PRN
  Administered 2013-09-08: 4 mg via INTRAVENOUS

## 2013-09-08 MED ORDER — MEPERIDINE HCL 100 MG/ML IJ SOLN
INTRAMUSCULAR | Status: AC
  Filled 2013-09-08: qty 2

## 2013-09-08 MED ORDER — ONDANSETRON HCL 4 MG/2ML IJ SOLN
INTRAMUSCULAR | Status: AC
  Filled 2013-09-08: qty 2

## 2013-09-08 MED ORDER — MEPERIDINE HCL 25 MG/ML IJ SOLN
INTRAMUSCULAR | Status: DC | PRN
  Administered 2013-09-08 (×2): 50 mg via INTRAVENOUS

## 2013-09-08 NOTE — ED Notes (Signed)
Pt reports she feels as though a piece of steak is stuck in her throat since her dinner last evening. States she is unable to swallow.

## 2013-09-08 NOTE — ED Provider Notes (Signed)
CSN: [id number]     Arrival date & time 09/08/13  0559 History   First MD Initiated Contact with Patient 09/08/13 [phone]     Chief Complaint  Patient presents with  . Foreign Body     (Consider location/radiation/quality/duration/timing/severity/associated sxs/prior Treatment) Patient is a 53 y.o. female presenting with foreign body. The history is provided by the patient.  Foreign Body She and her of her chest area. She has been unable to swallow anything since then. She's been spitting up her own saliva. She has tried to force herself to throw up without any benefit and she's tried drinking water without any benefit. She had a similar episode about one year ago issue is able to force herself to vomit and dislodged the piece of meat. She has been having swallowing difficulties and is getting radiation treatment for lung cancer.  Past Medical History  Diagnosis Date  . Lung cancer     pancoast tumor  . Hypertension   . Anxiety    Past Surgical History  Procedure Laterality Date  . Thoracotomy  [id number]    transsmanubrial anterolaterl thoracotomy with chest wall resection includinf the first rib and left  upper lobectomy for Pancoast turmor  . Portacath placement  [id number]    Rt   IJ  Port-A-Cath  dr.[email or web address]  . Tubal ligation    . Sternal wires removal  01/23/2012    Procedure: STERNAL WIRES REMOVAL;  Surgeon: Grace Isaac, MD;  Location: [place];  Service: Thoracic;  Laterality: N/A;  . Colonoscopy  12/2010    in [place]  . Laparoscopic appendectomy N/A 06/21/2013    Procedure: APPENDECTOMY LAPAROSCOPIC;  Surgeon: Jamesetta So, MD;  Location: [facility];  Service: General;  Laterality: N/A;   Family History  Problem Relation Age of Onset  . Colon cancer Neg Hx    History  Substance Use Topics  . Smoking status: Former Smoker    Quit date: 02/04/2010  . Smokeless tobacco: Never Used  . Alcohol Use: No   OB History   Grav Para Term Preterm Abortions TAB SAB Ect Mult  Living                 Review of Systems  All other systems reviewed and are negative.     Allergies  Review of patient's allergies indicates no known allergies.  Home Medications   Prior to Admission medications   Medication Sig Start Date End Date Taking? Authorizing Provider  ALPRAZolam Duanne Moron) 0.5 MG tablet Take 0.5 mg by mouth 2 (two) times daily as needed for anxiety.    Historical Provider, MD  Cholecalciferol (VITAMIN D3) 5000 UNITS CAPS Take 1 capsule by mouth daily.     Historical Provider, MD  DULoxetine (CYMBALTA) 30 MG capsule Take 30 mg by mouth 3 (three) times daily.    Historical Provider, MD  HYDROcodone-acetaminophen (NORCO) 10-325 MG per tablet Take 1 tablet by mouth 4 (four) times daily. Radiation pain 06/21/13   Jamesetta So, MD  lisinopril (PRINIVIL,ZESTRIL) 40 MG tablet Take 20 mg by mouth at bedtime.     Historical Provider, MD  LORazepam (ATIVAN) 0.5 MG tablet Take 0.25 mg by mouth at bedtime.     Historical Provider, MD  lubiprostone (AMITIZA) 24 MCG capsule Take 24 mcg by mouth 2 (two) times daily with a meal. 04/18/13   Orvil Feil, NP  meloxicam (MOBIC) 7.5 MG tablet Take 7.5 mg by mouth at bedtime. 06/12/13   Historical Provider, MD  traZODone (  DESYREL) 50 MG tablet Take 50 mg by mouth at bedtime.    Historical Provider, MD   BP 152/89  Pulse 75  Temp(Src) 98 F (36.7 C) (Oral)  Resp 18  Ht 5\' 4"  (1.626 m)  Wt 135 lb (61.236 kg)  BMI 23.16 kg/m2  SpO2 99% Physical Exam  Nursing note and vitals reviewed.  53 year old female, resting comfortably and in no acute distress. Vital signs are significant for hypertension. Oxygen saturation is 99%, which is normal. Head is normocephalic and atraumatic. PERRLA, EOMI. Oropharynx is clear. Neck is nontender and supple without adenopathy or JVD. Back is nontender and there is no CVA tenderness. Lungs are clear without rales, wheezes, or rhonchi. Chest is nontender. Heart has regular rate and rhythm without  murmur. Abdomen is soft, flat, nontender without masses or hepatosplenomegaly and peristalsis is normoactive. Extremities have no cyanosis or edema, full range of motion is present. Skin is warm and dry without rash. Neurologic: Mental status is normal, cranial nerves are intact, there are no motor or sensory deficits.  ED Course  Procedures (including critical care time) Labs Review Results for orders placed during the hospital encounter of 09/08/13  CBC WITH DIFFERENTIAL      Result Value Ref Range   WBC 7.1  4.0 - 10.5 K/uL   RBC 4.41  3.87 - 5.11 MIL/uL   Hemoglobin 12.9  12.0 - 15.0 g/dL   HCT 39.4  36.0 - 46.0 %   MCV 89.3  78.0 - 100.0 fL   MCH 29.3  26.0 - 34.0 pg   MCHC 32.7  30.0 - 36.0 g/dL   RDW 12.8  11.5 - 15.5 %   Platelets 329  150 - 400 K/uL   Neutrophils Relative % 63  43 - 77 %   Neutro Abs 4.4  1.7 - 7.7 K/uL   Lymphocytes Relative 20  12 - 46 %   Lymphs Abs 1.4  0.7 - 4.0 K/uL   Monocytes Relative 7  3 - 12 %   Monocytes Absolute 0.5  0.1 - 1.0 K/uL   Eosinophils Relative 9 (*) 0 - 5 %   Eosinophils Absolute 0.6  0.0 - 0.7 K/uL   Basophils Relative 1  0 - 1 %   Basophils Absolute 0.1  0.0 - 0.1 K/uL    MDM   Final diagnoses:  Esophageal obstruction due to food impaction    Esophageal meat impaction. 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 Review of old records shows that she was treated for Pancoast tumor.  Following administration of glucagon, there is no improvement. Dr. Gala Romney gastroenterology service has been consulted and will come into manage the patient.  Delora Fuel, MD [id number]/[phone]

## 2013-09-08 NOTE — ED Notes (Signed)
Patient states she went to dinner last night and got choked on a piece of steak.  Patient states she feels like something is stuck in her throat and she has not been able to swallow.

## 2013-09-08 NOTE — ED Notes (Signed)
MD at bedside. 

## 2013-09-08 NOTE — Discharge Instructions (Addendum)
EGD Discharge instructions Please read the instructions outlined below and refer to this sheet in the next few weeks. These discharge instructions provide you with general information on caring for yourself after you leave the hospital. Your doctor may also give you specific instructions. While your treatment has been planned according to the most current medical practices available, unavoidable complications occasionally occur. If you have any problems or questions after discharge, please call your doctor. ACTIVITY  You may resume your regular activity but move at a slower pace for the next 24 hours.   Take frequent rest periods for the next 24 hours.   Walking will help expel (get rid of) the air and reduce the bloated feeling in your abdomen.   No driving for 24 hours (because of the anesthesia (medicine) used during the test).   You may shower.   Do not sign any important legal documents or operate any machinery for 24 hours (because of the anesthesia used during the test).  NUTRITION  Drink plenty of fluids.   You may resume your normal diet.   Begin with a light meal and progress to your normal diet.   Avoid alcoholic beverages for 24 hours or as instructed by your caregiver.  MEDICATIONS  You may resume your normal medications unless your caregiver tells you otherwise.  WHAT YOU CAN EXPECT TODAY  You may experience abdominal discomfort such as a feeling of fullness or gas pains.  FOLLOW-UP  Your doctor will discuss the results of your test with you.  SEEK IMMEDIATE MEDICAL ATTENTION IF ANY OF THE FOLLOWING OCCUR:  Excessive nausea (feeling sick to your stomach) and/or vomiting.   Severe abdominal pain and distention (swelling).   Trouble swallowing.   Temperature over 101 F (37.8 C).   Rectal bleeding or vomiting of blood.   Stay on a pured / soft diet for now. Stay away from meats  My office will schedule an elective EGD with esophageal dilation a couple  of weeks.  Begin protonix 40 mg daily  Use MiraLax 17 g orally 1-2 doses twice daily as needed to achieve a bowel movement daily to every other day Dysphagia Level 1 Diet, Pureed The dysphasia level 1 diet includes foods that are completely pureed and smooth. The foods have a pudding-like texture, such as the texture of pureed pancakes, mashed potatoes, and yogurt. The diet does not include foods with lumps or coarse textures. Liquids should be smooth and may either be thin, nectar-thick, honey-like, or spoon-thick. This diet is helpful for people with moderate to severe swallowing problems. It reduces the risk of food getting caught in the windpipe, trachea, or lungs. You may need help or supervision during meals while following this diet. WHAT DO I NEED TO KNOW ABOUT THIS DIET? Foods  You may eat foods that are soft and have a pudding-like texture. If a food does not have this texture, you may be able to eat the food after:  Pureeing it. This can be done with a blender or whisk.  Moistening it with liquid. For example, you may have bread if you soak it in milk or syrup.  Avoid foods that are hard, dry, sticky, chunky, lumpy, or stringy. Also avoid foods with nuts, seeds, raisins, skins, and pulp.  Do not eat foods that you have to chew. If you have to chew the food, then you cannot eat it.  Eat a variety of foods to get all the nutrients you need. Liquids  You may drink liquids  that are smooth. Your health care provider will tell you if you should drink thin or thickened liquids.  To thicken a liquid, use a food and beverage thickener or a thickening food. Thickened liquids are usually a "pudding-like" consistency.  Thin liquids include fruit juices, milk, coffee, tea, yogurts, shakes, and similar foods that melt to thin liquid at room temperature.  Avoid liquids with seeds, pulp, or chunks. See your dietitian or health care provider regularly for help with your dietary  changes. WHAT FOODS CAN I EAT? Grains Store-bought soft breads, pancakes, and [place] toast that have a smooth, moist texture and do not have nuts or seeds (you will need to moisten the food with liquid). Cooked cereals that have a pudding-like consistency, such as cream of wheat or farina (no oatmeal). Pureed, well-cooked pasta, rice, and plain bread stuffing. Vegetables Pureed vegetables. Soft avocado. Smooth tomato paste or sauce. Strained or pureed soups (these may need to be thickened as directed). Mashed or pureed potatoes without skin (can be seasoned with butter, smooth gravy, margarine, or sour cream). Fruits Pureed fruits such as melons and apples without seeds or pulp. Mashed bananas. Smooth tomato paste or sauce. Fruit juices without pulp or seeds. Strained or pureed soups. Meat and Other Protein Sources Pureed meat. Smooth pate or liverwurst. Smooth souffles. Pureed beans (such as lentils). Pureed eggs. Dairy Yogurt. Smooth cheese sauces. Milk (may need to be thickened). Nutritional dairy drinks or shakes. Ask your health care provider whether you can have ice cream. Condiments Finely ground salt, pepper, and other ground spices. Sweets/Desserts Smooth puddings and custards. Pureed desserts. Souffles. Whipped topping. Ask your health care provider whether you can have frozen desserts. Fats and Oils Butter. Margarine. Smooth and strained gravy. Sour cream. Mayonnaise. Cream cheese. Whipped topping. Smooth sauces (such as white sauce, cheese sauce, or hollandaise sauce). The items listed above may not be a complete list of recommended foods or beverages. Contact your dietitian for more options. WHAT FOODS ARE NOT RECOMMENDED? Grains Oatmeal. Dry cereals. Hard breads. Vegetables Whole vegetables. Stringy vegetables (such as celery). Thin tomato sauce. Fruits Whole fresh, frozen, canned, or dried fruits that have not been pureed. Stringy fruits (such as pineapple). Meat and Other  Protein Sources Whole or ground meat, fish, or poultry. Dried or cooked lentils or legumes that have been cooked but not mashed or pureed. Non-pureed eggs. Nuts and seeds. Peanut butter. Dairy Non-pureed cheese. Dairy products with lumps or chunks. Ask your health care provider whether you can have ice cream. Condiments Coarse or seeded herbs and spices. Sweets/Desserts [place] preserves. Jams with seeds. Solid desserts. Sticky, chewy sweets (such as licorice and caramel). Ask your health care provider whether you can have frozen desserts. Fats and Oils Sauces of fats with lumps or chunks. The items listed above may not be a complete list of foods and beverages to avoid. Contact your dietitian for more information. Document Released: 01/10/2005 Document Revised: 05/27/2013 Document Reviewed: 12/24/2012 [facility] Patient Information 2015 [place], [place]. This information is not intended to replace advice given to you by your health care provider. Make sure you discuss any questions you have with your health care provider.

## 2013-09-08 NOTE — ED Notes (Signed)
Pt seen by Secretary leaving with OR.  Attempted to call OR with no answer. AC called to advise and to see if room needs to be held for pt after endo.

## 2013-09-08 NOTE — Op Note (Signed)
[facility] [address] [place], [address]   ENDOSCOPY PROCEDURE REPORT  PATIENT: [name], [name]  MR#: [id number] BIRTHDATE: 03/03/1960 , 53  yrs. old GENDER: Female ENDOSCOPIST: R.  Garfield Cornea, MD [facility] REFERRED BY:  [name], PA-C PROCEDURE DATE:  09/08/2013 PROCEDURE:     Emergency EGD with removal of esophageal food impaction  INDICATIONS:      esophageal dysphagia after swallowing a piece of steak last evening;  Now unable to swallow even saliva  INFORMED CONSENT:   The risks, benefits, limitations, alternatives and imponderables have been discussed.  The potential for biopsy, esophogeal dilation, etc. have also been reviewed.  Questions have been answered.  All parties agreeable.  Please see the history and physical in the medical record for more information.  MEDICATIONS:   Versed 4 mg IV and Demerol 100 mg IV in divided doses. Cetacaine spray gargle. Zofran 4 mg IV  DESCRIPTION OF PROCEDURE:   The [id number] ([id number])  endoscope was introduced through the mouth and advanced to the esophagus. Findings as outlined below.     FINDINGS: couple of large pieces of meat ball valving in the proximal esophagus. Initially,  gentle pressure applied on the proximal end of the bolus with the scope would not advance it distally. Subsequently, utilizing the [facility] net, I piecemeal removed several parts of this bolus.  Finally, again applying gentle pressure against the proximal aspect of the remaining bolus resulted in the bolus passing rapidly into the stomach. There appeared to be a mid esophageal stricture with some macerated/inflamed mucosa at this level. Also has a Schatzki's ring and apparent hiatal hernia. There was food in the proximal stomach which precluded complete examination and esophageal dilation today.  THERAPEUTIC / DIAGNOSTIC MANEUVERS PERFORMED:  As above   COMPLICATIONS:  None  IMPRESSION:    Removal of esophageal food  impaction as described above. 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 Schatzki's ring. Hiatal hernia.  Incomplete EGDas described above. No esophageal dilation per plan.  RECOMMENDATIONS:  Begin Protonix 40 mg daily. We'll arrange for patient to return in 2-3 weeks for elective EGD with esophageal dilation as appropriate. In the interim, pured/soft diet. Would avoid meat.    _______________________________ R. Garfield Cornea, MD FACP [facility] eSigned:  R. Garfield Cornea, MD FACP [facility] 09/08/2013 9:50 AM     CC:

## 2013-09-08 NOTE — Consult Note (Signed)
@LOGO @   Primary Care Physician:  Terald Sleeper, PA-C Primary Gastroenterologist:  Dr. Gala Romney  Pre-Procedure History & Physical: HPI:  [name] is a 53 y.o. female here for probable food impaction. Presented this morning to ED and saw Dr. Roxanne Mins. Ate meat last night around 8:00pm; felt it stick behind her breastbone. She has had intermittent sensations of transient dysphagia chronically. 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 Evaluated in the ED. Given IV glucagon. Unable to swallow even saliva. I was consulted. We saw this nice lady back in the first of the year for chronic constipation.  Constipation continues to be an issue. She has a partial dental plate  Past Medical History  Diagnosis Date  . Lung cancer     pancoast tumor  . Hypertension   . Anxiety     Past Surgical History  Procedure Laterality Date  . Thoracotomy  [id number]    transsmanubrial anterolaterl thoracotomy with chest wall resection includinf the first rib and left  upper lobectomy for Pancoast turmor  . Portacath placement  [id number]    Rt   IJ  Port-A-Cath  dr.burney  . Tubal ligation    . Sternal wires removal  01/23/2012    Procedure: STERNAL WIRES REMOVAL;  Surgeon: Grace Isaac, MD;  Location: [facility];  Service: Thoracic;  Laterality: N/A;  . Colonoscopy  12/2010    in [place]  . Laparoscopic appendectomy N/A 06/21/2013    Procedure: APPENDECTOMY LAPAROSCOPIC;  Surgeon: Jamesetta So, MD;  Location: [facility];  Service: General;  Laterality: N/A;    Prior to Admission medications   Medication Sig Start Date End Date Taking? Authorizing Provider  ALPRAZolam Duanne Moron) 0.5 MG tablet Take 0.5 mg by mouth 2 (two) times daily as needed for anxiety.    Historical Provider, MD  Cholecalciferol (VITAMIN D3) 5000 UNITS CAPS Take 1 capsule by mouth daily.     Historical Provider, MD  DULoxetine (CYMBALTA) 30 MG capsule Take 30 mg by mouth 3 (three) times daily.     Historical Provider, MD  HYDROcodone-acetaminophen (NORCO) 10-325 MG per tablet Take 1 tablet by mouth 4 (four) times daily. Radiation pain 06/21/13   Jamesetta So, MD  lisinopril (PRINIVIL,ZESTRIL) 40 MG tablet Take 20 mg by mouth at bedtime.     Historical Provider, MD  LORazepam (ATIVAN) 0.5 MG tablet Take 0.25 mg by mouth at bedtime.     Historical Provider, MD  lubiprostone (AMITIZA) 24 MCG capsule Take 24 mcg by mouth 2 (two) times daily with a meal. 04/18/13   Orvil Feil, NP  meloxicam (MOBIC) 7.5 MG tablet Take 7.5 mg by mouth at bedtime. 06/12/13   Historical Provider, MD  traZODone (DESYREL) 50 MG tablet Take 50 mg by mouth at bedtime.    Historical Provider, MD    Allergies as of 09/08/2013  . (No Known Allergies)    Family History  Problem Relation Age of Onset  . Colon cancer Neg Hx     History   Social History  . Marital Status: Married    Spouse Name: N/A    Number of Children: N/A  . Years of Education: N/A   Occupational History  . Not on file.   Social History Main Topics  . Smoking status: Former Smoker    Quit date: 02/04/2010  . Smokeless tobacco: Never Used  . Alcohol Use: No  . Drug Use: No  . Sexual Activity: Not on file  Other Topics Concern  . Not on file   Social History Narrative  . No narrative on file    Review of Systems: See HPI, otherwise negative ROS  Physical Exam: BP 152/89  Pulse 75  Temp(Src) 98 F (36.7 C) (Oral)  Resp 18  Ht 5\' 4"  (1.626 m)  Wt 135 lb (61.236 kg)  BMI 23.16 kg/m2  SpO2 99% General:   Alert,  Well-developed, well-nourished, pleasant and cooperative in NAD Skin:  Intact without significant lesions or rashes. Eyes:  Sclera clear, no icterus.   Conjunctiva pink. Ears:  Normal auditory acuity. Nose:  No deformity, discharge,  or lesions. Mouth:  No deformity or lesions. Neck:  Supple; no masses or thyromegaly. No significant cervical adenopathy. Lungs:  Clear throughout to auscultation.   No wheezes,  crackles, or rhonchi. No acute distress. Heart:  Regular rate and rhythm; no murmurs, clicks, rubs,  or gallops. Abdomen: Non-distended, normal bowel sounds.  Soft and nontender without appreciable mass or hepatosplenomegaly.  Pulses:  Normal pulses noted. Extremities:  Without clubbing or edema.  Impression/Plan: Pleasant 53 year old lady with a history of Pancoast tumor  -  status post a thoracotomy/resection. History of radiation treatment. Intermittent chronic esophageal dysphagia to solids.  Now presents acutely with signs/symptoms of esophageal food impaction. History of difficulty intubation noted at appendectomy earlier this year. Plan for emergency EGD with removal of food impaction under conscious sedation/topical oropharyngeal anesthesia. Patient understands I may well not perform esophageal dilation today as her upper GI tract may not be empty and there may be significant pre-existing inflammation in her esophagus. The risks, benefits, limitations, alternatives and imponderables have been reviewed with the patient. Potential for esophageal dilation, biopsy, etc. have also been reviewed.  Questions have been answered. All parties agreeable.      Notice: This dictation was prepared with Dragon dictation along with smaller phrase technology. Any transcriptional errors that result from this process are unintentional and may not be corrected upon review.

## 2013-09-12 ENCOUNTER — Encounter (HOSPITAL_COMMUNITY): Payer: Self-pay | Admitting: Internal Medicine

## 2013-09-18 ENCOUNTER — Telehealth: Payer: Self-pay | Admitting: Internal Medicine

## 2013-09-18 NOTE — Telephone Encounter (Signed)
PATIENT SAW RMR IN ER WITH MEAT STUCK IN THROAT AND HE TOLD HER SHE WOULD BE SCHEDULED FOR EGD. PLEASE CALL HER AT [phone] SHE WILL BE THERE UNTIL4PM.

## 2013-09-18 NOTE — Telephone Encounter (Signed)
Dr. Gala Romney does this lady need to be triaged or an office visit?

## 2013-09-19 NOTE — Telephone Encounter (Signed)
Patient is scheduled for OV on 09/25/13 w/LSL for H&P patient did not have one

## 2013-09-19 NOTE — Telephone Encounter (Signed)
Triage will do only if H&P is within 30 days

## 2013-09-25 ENCOUNTER — Other Ambulatory Visit: Payer: Self-pay

## 2013-09-25 ENCOUNTER — Encounter: Payer: Self-pay | Admitting: Gastroenterology

## 2013-09-25 ENCOUNTER — Ambulatory Visit (INDEPENDENT_AMBULATORY_CARE_PROVIDER_SITE_OTHER): Payer: BC Managed Care – PPO | Admitting: Gastroenterology

## 2013-09-25 VITALS — BP 124/85 | HR 75 | Temp 98.4°F | Ht 64.0 in | Wt 141.8 lb

## 2013-09-25 DIAGNOSIS — R1319 Other dysphagia: Secondary | ICD-10-CM

## 2013-09-25 DIAGNOSIS — R1314 Dysphagia, pharyngoesophageal phase: Secondary | ICD-10-CM

## 2013-09-25 DIAGNOSIS — R1310 Dysphagia, unspecified: Secondary | ICD-10-CM

## 2013-09-25 NOTE — Assessment & Plan Note (Signed)
53 year old lady with history of esophageal dysphagia with recent food impaction requiring emergent EGD. 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 She also Schatzki ring. Exam was incomplete due to the retained food within the stomach. She presents now for EGD with esophageal dilation.  I have discussed the risks, alternatives, benefits with regards to but not limited to the risk of reaction to medication, bleeding, infection, perforation and the patient is agreeable to proceed. Written consent to be obtained.

## 2013-09-25 NOTE — Progress Notes (Signed)
Primary Care Physician:  Terald Sleeper, PA-C  Primary Gastroenterologist:   Garfield Cornea, MD    Chief Complaint  Patient presents with  . Dysphagia    3 weeks ago    HPI:  [name] is a 53 y.o. female here to schedule EGD. She was seen in the emergency department and underwent EGD on 09/08/2013 for removal of esophageal food impaction. 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 Schatzki ring. Hiatal hernia. No esophageal dilation. EGD was incomplete due to proximal food in the proximal stomach. She was started on protonix 40 mg daily.  Describes at least 2 episodes of significant food impaction with steak. Has frequently had milder episodes of dysphagia especially if she does not chew her food carefully or eats out in public. No heartburn. Amitiza caused nausea even when she took it with a meal. Currently taking MiraLax daily which helps. Reports last colonoscopy after cancer diagnosis in 2012. Denies having polyps and was advised to come back in 10 years.    Current Outpatient Prescriptions  Medication Sig Dispense Refill  . Cholecalciferol (VITAMIN D3) 5000 UNITS CAPS Take 1 capsule by mouth daily.       . DULoxetine (CYMBALTA) 30 MG capsule Take 30 mg by mouth 3 (three) times daily.      Marland Kitchen HYDROcodone-acetaminophen (NORCO) 10-325 MG per tablet Take 1 tablet by mouth 4 (four) times daily. Radiation pain  40 tablet  0  . lisinopril (PRINIVIL,ZESTRIL) 40 MG tablet Take 20 mg by mouth at bedtime.       Marland Kitchen LORazepam (ATIVAN) 0.5 MG tablet Take 0.25 mg by mouth at bedtime.       . meloxicam (MOBIC) 7.5 MG tablet Take 7.5 mg by mouth at bedtime.      . pantoprazole (PROTONIX) 40 MG tablet 40 mg daily.      . traZODone (DESYREL) 50 MG tablet Take 50 mg by mouth at bedtime.       No current facility-administered medications for this visit.    Allergies as of 09/25/2013  . (No Known Allergies)    Past Medical History  Diagnosis Date  . Lung  cancer     pancoast tumor  . Hypertension   . Anxiety     Past Surgical History  Procedure Laterality Date  . Thoracotomy  [id number]    transsmanubrial anterolaterl thoracotomy with chest wall resection includinf the first rib and left  upper lobectomy for Pancoast turmor  . Portacath placement  [id number]    Rt   IJ  Port-A-Cath  dr.burney  . Tubal ligation    . Sternal wires removal  01/23/2012    Procedure: STERNAL WIRES REMOVAL;  Surgeon: Grace Isaac, MD;  Location: [facility];  Service: Thoracic;  Laterality: N/A;  . Colonoscopy  12/2010    in [place]  . Laparoscopic appendectomy N/A 06/21/2013    Procedure: APPENDECTOMY LAPAROSCOPIC;  Surgeon: Jamesetta So, MD;  Location: [facility];  Service: General;  Laterality: N/A;  . Foreign body removal N/A 09/08/2013    RMR:Mid esophageal stricture-likely radiation-induced/Schatzki's ring. Hiatal hernia    Family History  Problem Relation Age of Onset  . Colon cancer Neg Hx     History   Social History  . Marital Status: Married    Spouse Name: N/A    Number of Children: N/A  . Years of Education: N/A   Occupational History  . Not on file.   Social History Main Topics  .  Smoking status: Former Smoker    Quit date: 02/04/2010  . Smokeless tobacco: Never Used  . Alcohol Use: No  . Drug Use: No  . Sexual Activity: Not on file   Other Topics Concern  . Not on file   Social History Narrative  . No narrative on file      ROS:  General: Negative for anorexia, weight loss, fever, chills, fatigue, weakness. Eyes: Negative for vision changes.  ENT: Negative for hoarseness,   nasal congestion. CV: Negative for chest pain, angina, palpitations, dyspnea on exertion, peripheral edema.  Respiratory: Negative for dyspnea at rest, dyspnea on exertion, cough, sputum, wheezing.  GI: See history of present illness. GU:  Negative for dysuria, hematuria, urinary incontinence, urinary frequency, nocturnal urination.  MS: Negative  for joint pain. +SI joint, low back pain.  Derm: Negative for rash or itching.  Neuro: Negative for weakness, abnormal sensation, seizure, frequent headaches, memory loss, confusion.  Psych: Negative for anxiety, depression, suicidal ideation, hallucinations.  Endo: Negative for unusual weight change.  Heme: Negative for bruising or bleeding. Allergy: Negative for rash or hives.    Physical Examination:  BP 124/85  Pulse 75  Temp(Src) 98.4 F (36.9 C) (Oral)  Ht 5\' 4"  (1.626 m)  Wt 141 lb 12.8 oz (64.32 kg)  BMI 24.33 kg/m2   General: Well-nourished, well-developed in no acute distress.  Head: Normocephalic, atraumatic.   Eyes: Conjunctiva pink, no icterus. Mouth: Oropharyngeal mucosa moist and pink , no lesions erythema or exudate. Neck: Supple without thyromegaly, masses, or lymphadenopathy.  Lungs: Clear to auscultation bilaterally.  Heart: Regular rate and rhythm, no murmurs rubs or gallops.  Abdomen: Bowel sounds are normal, nontender, nondistended, no hepatosplenomegaly or masses, no abdominal bruits or    hernia , no rebound or guarding.   Rectal: not performed Extremities: No lower extremity edema. No clubbing or deformities.  Neuro: Alert and oriented x 4 , grossly normal neurologically.  Skin: Warm and dry, no rash or jaundice.   Psych: Alert and cooperative, normal mood and affect.  Lab Results  Component Value Date   WBC 7.1 09/08/2013   HGB 12.9 09/08/2013   HCT 39.4 09/08/2013   MCV 89.3 09/08/2013   PLT 329 09/08/2013   Lab Results  Component Value Date   CREATININE 0.80 09/08/2013   BUN 22 09/08/2013   NA 141 09/08/2013   K 4.0 09/08/2013   CL 102 09/08/2013   CO2 29 09/08/2013

## 2013-09-25 NOTE — Patient Instructions (Signed)
1. Upper endoscopy with Dr. Gala Romney. See separate instructions.

## 2013-09-26 NOTE — Progress Notes (Signed)
Cc to pcp °

## 2013-09-27 NOTE — Progress Notes (Signed)
CC TO PCP °

## 2013-10-15 ENCOUNTER — Encounter (HOSPITAL_COMMUNITY): Payer: Self-pay | Admitting: Pharmacy Technician

## 2013-10-16 ENCOUNTER — Encounter (HOSPITAL_COMMUNITY): Payer: Self-pay | Admitting: *Deleted

## 2013-10-16 ENCOUNTER — Encounter (HOSPITAL_COMMUNITY)
Admission: RE | Disposition: A | Discharge status: Home / Self Care | Payer: Self-pay | Source: Ambulatory Visit | Attending: Internal Medicine

## 2013-10-16 ENCOUNTER — Ambulatory Visit (HOSPITAL_COMMUNITY)
Admission: RE | Admit: 2013-10-16 | Discharge: 2013-10-16 | Disposition: A | Discharge status: Home / Self Care | Payer: BC Managed Care – PPO | Source: Ambulatory Visit | Attending: Internal Medicine | Admitting: Internal Medicine

## 2013-10-16 DIAGNOSIS — Z85118 Personal history of other malignant neoplasm of bronchus and lung: Secondary | ICD-10-CM | POA: Insufficient documentation

## 2013-10-16 DIAGNOSIS — I10 Essential (primary) hypertension: Secondary | ICD-10-CM | POA: Insufficient documentation

## 2013-10-16 DIAGNOSIS — Z87891 Personal history of nicotine dependence: Secondary | ICD-10-CM | POA: Insufficient documentation

## 2013-10-16 DIAGNOSIS — K2960 Other gastritis without bleeding: Secondary | ICD-10-CM | POA: Diagnosis not present

## 2013-10-16 DIAGNOSIS — K222 Esophageal obstruction: Secondary | ICD-10-CM | POA: Insufficient documentation

## 2013-10-16 DIAGNOSIS — R1310 Dysphagia, unspecified: Secondary | ICD-10-CM | POA: Diagnosis present

## 2013-10-16 DIAGNOSIS — Z902 Acquired absence of lung [part of]: Secondary | ICD-10-CM | POA: Insufficient documentation

## 2013-10-16 DIAGNOSIS — F411 Generalized anxiety disorder: Secondary | ICD-10-CM | POA: Insufficient documentation

## 2013-10-16 DIAGNOSIS — Q393 Congenital stenosis and stricture of esophagus: Secondary | ICD-10-CM

## 2013-10-16 DIAGNOSIS — R1319 Other dysphagia: Secondary | ICD-10-CM

## 2013-10-16 DIAGNOSIS — K449 Diaphragmatic hernia without obstruction or gangrene: Secondary | ICD-10-CM | POA: Insufficient documentation

## 2013-10-16 DIAGNOSIS — K259 Gastric ulcer, unspecified as acute or chronic, without hemorrhage or perforation: Secondary | ICD-10-CM

## 2013-10-16 DIAGNOSIS — Z79899 Other long term (current) drug therapy: Secondary | ICD-10-CM | POA: Diagnosis not present

## 2013-10-16 DIAGNOSIS — R1314 Dysphagia, pharyngoesophageal phase: Secondary | ICD-10-CM

## 2013-10-16 DIAGNOSIS — Q391 Atresia of esophagus with tracheo-esophageal fistula: Secondary | ICD-10-CM

## 2013-10-16 HISTORY — PX: ESOPHAGOGASTRODUODENOSCOPY: SHX5428

## 2013-10-16 HISTORY — PX: MALONEY DILATION: SHX5535

## 2013-10-16 SURGERY — EGD (ESOPHAGOGASTRODUODENOSCOPY)
Anesthesia: Moderate Sedation

## 2013-10-16 MED ORDER — MIDAZOLAM HCL 5 MG/5ML IJ SOLN
INTRAMUSCULAR | Status: DC | PRN
  Administered 2013-10-16: 1 mg via INTRAVENOUS
  Administered 2013-10-16 (×2): 2 mg via INTRAVENOUS
  Administered 2013-10-16: 1 mg via INTRAVENOUS

## 2013-10-16 MED ORDER — STERILE WATER FOR IRRIGATION IR SOLN
Status: DC | PRN
  Administered 2013-10-16: 12:00:00

## 2013-10-16 MED ORDER — MIDAZOLAM HCL 5 MG/5ML IJ SOLN
INTRAMUSCULAR | Status: AC
  Filled 2013-10-16: qty 10

## 2013-10-16 MED ORDER — SODIUM CHLORIDE 0.9 % IV SOLN
INTRAVENOUS | Status: DC
  Administered 2013-10-16: 10:00:00 via INTRAVENOUS

## 2013-10-16 MED ORDER — LIDOCAINE VISCOUS 2 % MT SOLN
OROMUCOSAL | Status: DC | PRN
  Administered 2013-10-16 (×2): 2 mL via OROMUCOSAL

## 2013-10-16 MED ORDER — LIDOCAINE VISCOUS 2 % MT SOLN
OROMUCOSAL | Status: AC
  Filled 2013-10-16: qty 15

## 2013-10-16 MED ORDER — ONDANSETRON HCL 4 MG/2ML IJ SOLN
INTRAMUSCULAR | Status: DC | PRN
  Administered 2013-10-16: 4 mg via INTRAVENOUS

## 2013-10-16 MED ORDER — ONDANSETRON HCL 4 MG/2ML IJ SOLN
INTRAMUSCULAR | Status: AC
  Filled 2013-10-16: qty 2

## 2013-10-16 MED ORDER — MEPERIDINE HCL 100 MG/ML IJ SOLN
INTRAMUSCULAR | Status: AC
  Filled 2013-10-16: qty 2

## 2013-10-16 MED ORDER — MEPERIDINE HCL 100 MG/ML IJ SOLN
INTRAMUSCULAR | Status: DC | PRN
  Administered 2013-10-16: 25 mg via INTRAVENOUS
  Administered 2013-10-16: 50 mg via INTRAVENOUS

## 2013-10-16 NOTE — Discharge Instructions (Addendum)
Continue Protonix 40 mg daily  Further recommendations to follow pending review of pathology report  Swallowing precautions reviewed.   Office visit with [place] in 6 months-Office will call you to make appt.   EGD Discharge instructions Please read the instructions outlined below and refer to this sheet in the next few weeks. These discharge instructions provide you with general information on caring for yourself after you leave the hospital. Your doctor may also give you specific instructions. While your treatment has been planned according to the most current medical practices available, unavoidable complications occasionally occur. If you have any problems or questions after discharge, please call your doctor. ACTIVITY  You may resume your regular activity but move at a slower pace for the next 24 hours.   Take frequent rest periods for the next 24 hours.   Walking will help expel (get rid of) the air and reduce the bloated feeling in your abdomen.   No driving for 24 hours (because of the anesthesia (medicine) used during the test).   You may shower.   Do not sign any important legal documents or operate any machinery for 24 hours (because of the anesthesia used during the test).  NUTRITION  Drink plenty of fluids.   You may resume your normal diet.   Begin with a light meal and progress to your normal diet.   Avoid alcoholic beverages for 24 hours or as instructed by your caregiver.  MEDICATIONS  You may resume your normal medications unless your caregiver tells you otherwise.  WHAT YOU CAN EXPECT TODAY  You may experience abdominal discomfort such as a feeling of fullness or gas pains.  FOLLOW-UP  Your doctor will discuss the results of your test with you.  SEEK IMMEDIATE MEDICAL ATTENTION IF ANY OF THE FOLLOWING OCCUR:  Excessive nausea (feeling sick to your stomach) and/or vomiting.   Severe abdominal pain and distention (swelling).   Trouble swallowing.    Temperature over 101 F (37.8 C).   Rectal bleeding or vomiting of blood.

## 2013-10-16 NOTE — Op Note (Signed)
[facility] [address] [place], [address]   ENDOSCOPY PROCEDURE REPORT  PATIENT: [name], [name]  MR#: [id number] BIRTHDATE: July 10, 1960 , 61  yrs. old GENDER: female ENDOSCOPIST: R.  Garfield Cornea, MD [facility] REFERRED BY:  [name], PA-C PROCEDURE DATE:  10/16/2013 PROCEDURE:  EGD w/ biopsy and Maloney dilation of esophagus ASA CLASS: INDICATIONS:  dysphagia. MEDICATIONS: Versed 6 mg IV and Demerol 75 mg IV and Zofran 4 mg IV Xylocaine jelly TOPICAL ANESTHETIC:  CONSENT: The risks, benefits, limitations, alternatives and imponderables have been discussed.  The potential for biopsy, esophogeal dilation, etc. have also been reviewed.  Questions have been answered.  All parties agreeable.  Please see the history and physical in the medical record for more information.  DESCRIPTION OF PROCEDURE: After the risks benefits and alternatives of the procedure were thoroughly explained, informed consent was obtained.  The EG-[id number] ([id number]) endoscope was introduced through the mouth and advanced to the second portion of the duodenum , limited by Without limitations. The instrument was slowly withdrawn as the mucosa was fully examined.      ESOPHAGUS: A moderately severe Schatzki ring was found at the gastroesophageal junction.  Following this dilation, there was no change in the appearance of the stricture.   Subsequently, 4 quadrant bites of the ring were taken with the cold forceps. This was done effectively without apparent complication.  STOMACH: Multiple 8mm round and shallow erosions were found in the gastric antrum.  Multiple biopsies was performed using cold forceps.  Sample sent for histology.   A 2 cm hiatal hernia was noted.  Retroflexed views revealed no abnormalities.     The scope was then withdrawn from the patient and the procedure completed.  COMPLICATIONS: There were no complications.  ENDOSCOPIC IMPRESSION: 1.   Schatzki ring was  found at the gastroesophageal junction; Status post Maloney dilation in disruption as described above. 2.   Multiple 47mm erosions were found in the gastric antrum; multiple biopsies was performed 3.   2 cm hiatal hernia  RECOMMENDATIONS: 1.  Continue current meds 2.  Swallowing precautions reviewed 3.  Followup on pathology. 4.  Office visit in 6 months  REPEAT EXAM:  eSigned:  R. Garfield Cornea, MD FACP [facility] 10/16/2013 12:57 PM    CC:  PATIENT NAME:  [name], [name] MR#: [id number]

## 2013-10-16 NOTE — H&P (View-Only) (Signed)
Primary Care Physician:  Terald Sleeper, PA-C  Primary Gastroenterologist:   Garfield Cornea, MD    Chief Complaint  Patient presents with  . Dysphagia    3 weeks ago    HPI:  [name] is a 53 y.o. female here to schedule EGD. She was seen in the emergency department and underwent EGD on 09/08/2013 for removal of esophageal food impaction. 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 Schatzki ring. Hiatal hernia. No esophageal dilation. EGD was incomplete due to proximal food in the proximal stomach. She was started on protonix 40 mg daily.  Describes at least 2 episodes of significant food impaction with steak. Has frequently had milder episodes of dysphagia especially if she does not chew her food carefully or eats out in public. No heartburn. Amitiza caused nausea even when she took it with a meal. Currently taking MiraLax daily which helps. Reports last colonoscopy after cancer diagnosis in 2012. Denies having polyps and was advised to come back in 10 years.    Current Outpatient Prescriptions  Medication Sig Dispense Refill  . Cholecalciferol (VITAMIN D3) 5000 UNITS CAPS Take 1 capsule by mouth daily.       . DULoxetine (CYMBALTA) 30 MG capsule Take 30 mg by mouth 3 (three) times daily.      Marland Kitchen HYDROcodone-acetaminophen (NORCO) 10-325 MG per tablet Take 1 tablet by mouth 4 (four) times daily. Radiation pain  40 tablet  0  . lisinopril (PRINIVIL,ZESTRIL) 40 MG tablet Take 20 mg by mouth at bedtime.       Marland Kitchen LORazepam (ATIVAN) 0.5 MG tablet Take 0.25 mg by mouth at bedtime.       . meloxicam (MOBIC) 7.5 MG tablet Take 7.5 mg by mouth at bedtime.      . pantoprazole (PROTONIX) 40 MG tablet 40 mg daily.      . traZODone (DESYREL) 50 MG tablet Take 50 mg by mouth at bedtime.       No current facility-administered medications for this visit.    Allergies as of 09/25/2013  . (No Known Allergies)    Past Medical History  Diagnosis Date  . Lung  cancer     pancoast tumor  . Hypertension   . Anxiety     Past Surgical History  Procedure Laterality Date  . Thoracotomy  [id number]    transsmanubrial anterolaterl thoracotomy with chest wall resection includinf the first rib and left  upper lobectomy for Pancoast turmor  . Portacath placement  [id number]    Rt   IJ  Port-A-Cath  dr.burney  . Tubal ligation    . Sternal wires removal  01/23/2012    Procedure: STERNAL WIRES REMOVAL;  Surgeon: Grace Isaac, MD;  Location: [place];  Service: Thoracic;  Laterality: N/A;  . Colonoscopy  12/2010    in [place]  . Laparoscopic appendectomy N/A 06/21/2013    Procedure: APPENDECTOMY LAPAROSCOPIC;  Surgeon: Jamesetta So, MD;  Location: [facility];  Service: General;  Laterality: N/A;  . Foreign body removal N/A 09/08/2013    RMR:Mid esophageal stricture-likely radiation-induced/Schatzki's ring. Hiatal hernia    Family History  Problem Relation Age of Onset  . Colon cancer Neg Hx     History   Social History  . Marital Status: Married    Spouse Name: N/A    Number of Children: N/A  . Years of Education: N/A   Occupational History  . Not on file.   Social History Main Topics  .  Smoking status: Former Smoker    Quit date: 02/04/2010  . Smokeless tobacco: Never Used  . Alcohol Use: No  . Drug Use: No  . Sexual Activity: Not on file   Other Topics Concern  . Not on file   Social History Narrative  . No narrative on file      ROS:  General: Negative for anorexia, weight loss, fever, chills, fatigue, weakness. Eyes: Negative for vision changes.  ENT: Negative for hoarseness,   nasal congestion. CV: Negative for chest pain, angina, palpitations, dyspnea on exertion, peripheral edema.  Respiratory: Negative for dyspnea at rest, dyspnea on exertion, cough, sputum, wheezing.  GI: See history of present illness. GU:  Negative for dysuria, hematuria, urinary incontinence, urinary frequency, nocturnal urination.  MS: Negative  for joint pain. +SI joint, low back pain.  Derm: Negative for rash or itching.  Neuro: Negative for weakness, abnormal sensation, seizure, frequent headaches, memory loss, confusion.  Psych: Negative for anxiety, depression, suicidal ideation, hallucinations.  Endo: Negative for unusual weight change.  Heme: Negative for bruising or bleeding. Allergy: Negative for rash or hives.    Physical Examination:  BP 124/85  Pulse 75  Temp(Src) 98.4 F (36.9 C) (Oral)  Ht 5\' 4"  (1.626 m)  Wt 141 lb 12.8 oz (64.32 kg)  BMI 24.33 kg/m2   General: Well-nourished, well-developed in no acute distress.  Head: Normocephalic, atraumatic.   Eyes: Conjunctiva pink, no icterus. Mouth: Oropharyngeal mucosa moist and pink , no lesions erythema or exudate. Neck: Supple without thyromegaly, masses, or lymphadenopathy.  Lungs: Clear to auscultation bilaterally.  Heart: Regular rate and rhythm, no murmurs rubs or gallops.  Abdomen: Bowel sounds are normal, nontender, nondistended, no hepatosplenomegaly or masses, no abdominal bruits or    hernia , no rebound or guarding.   Rectal: not performed Extremities: No lower extremity edema. No clubbing or deformities.  Neuro: Alert and oriented x 4 , grossly normal neurologically.  Skin: Warm and dry, no rash or jaundice.   Psych: Alert and cooperative, normal mood and affect.  Lab Results  Component Value Date   WBC 7.1 09/08/2013   HGB 12.9 09/08/2013   HCT 39.4 09/08/2013   MCV 89.3 09/08/2013   PLT 329 09/08/2013   Lab Results  Component Value Date   CREATININE 0.80 09/08/2013   BUN 22 09/08/2013   NA 141 09/08/2013   K 4.0 09/08/2013   CL 102 09/08/2013   CO2 29 09/08/2013

## 2013-10-16 NOTE — Interval H&P Note (Signed)
History and Physical Interval Note:  10/16/2013 11:57 AM  [name]  has presented today for surgery, with the diagnosis of esophageal dysphagia  The various methods of treatment have been discussed with the patient and family. After consideration of risks, benefits and other options for treatment, the patient has consented to  Procedure(s) with comments: ESOPHAGOGASTRODUODENOSCOPY (EGD) (N/A) - 1230-rescheduled 9/23 @ [place] notified pt SAVORY DILATION (N/A) MALONEY DILATION (N/A) as a surgical intervention .  The patient's history has been reviewed, patient examined, no change in status, stable for surgery.  I have reviewed the patient's chart and labs.  Questions were answered to the patient's satisfaction.     [name]  No change. EGD with esophageal dilation as appropriate today. The risks, benefits, limitations, alternatives and imponderables have been reviewed with the patient. Potential for esophageal dilation, biopsy, etc. have also been reviewed.  Questions have been answered. All parties agreeable.

## 2013-10-18 ENCOUNTER — Encounter (HOSPITAL_COMMUNITY): Payer: Self-pay | Admitting: Internal Medicine

## 2013-10-22 ENCOUNTER — Encounter: Payer: Self-pay | Admitting: Internal Medicine

## 2013-11-27 ENCOUNTER — Other Ambulatory Visit: Payer: Self-pay

## 2013-11-27 DIAGNOSIS — C3492 Malignant neoplasm of unspecified part of left bronchus or lung: Secondary | ICD-10-CM

## 2014-01-09 ENCOUNTER — Ambulatory Visit (INDEPENDENT_AMBULATORY_CARE_PROVIDER_SITE_OTHER): Payer: BC Managed Care – PPO | Admitting: Cardiothoracic Surgery

## 2014-01-09 ENCOUNTER — Ambulatory Visit
Admission: RE | Admit: 2014-01-09 | Discharge: 2014-01-09 | Disposition: A | Discharge status: Home / Self Care | Payer: BC Managed Care – PPO | Source: Ambulatory Visit | Attending: Cardiothoracic Surgery | Admitting: Cardiothoracic Surgery

## 2014-01-09 ENCOUNTER — Encounter: Payer: Self-pay | Admitting: Cardiothoracic Surgery

## 2014-01-09 VITALS — BP 144/91 | HR 84 | Resp 16 | Ht 64.0 in | Wt 143.0 lb

## 2014-01-09 DIAGNOSIS — Z902 Acquired absence of lung [part of]: Secondary | ICD-10-CM

## 2014-01-09 DIAGNOSIS — Z9889 Other specified postprocedural states: Secondary | ICD-10-CM

## 2014-01-09 DIAGNOSIS — C3412 Malignant neoplasm of upper lobe, left bronchus or lung: Secondary | ICD-10-CM

## 2014-01-09 DIAGNOSIS — C3492 Malignant neoplasm of unspecified part of left bronchus or lung: Secondary | ICD-10-CM

## 2014-01-09 NOTE — Progress Notes (Signed)
[place] Record #[id number] Date of Birth: [birth year]  [name] Graves, DO Terald Sleeper, PA-C  Chief Complaint:   PostOp Follow Up Visit 06/25/2010  PREOPERATIVE DIAGNOSIS: Pancoast tumor.  POSTOPERATIVE DIAGNOSIS: Pancoast tumor.  PROCEDURE PERFORMED: Transmanubrial anterolateral thoracotomy with  chest wall resection including the first rib and left upper lobectomy  for Pancoast tumor.  ypTX , pN0   History of Present Illness:      Patient returns for followup visit today now little over a 31/2 years  after resection of Pancoast tumor through a trans-manubrial anterolateral thoracotomy with chest wall resection. She's done well postoperatively notes that she has full range of motion and function of her left arm. She is not smoking   History  Smoking status  . Former Smoker  . Quit date: 02/04/2010  Smokeless tobacco  . Never Used       No Known Allergies  Current Outpatient Prescriptions  Medication Sig Dispense Refill  . DULoxetine (CYMBALTA) 30 MG capsule Take 30 mg by mouth 3 (three) times daily.    Marland Kitchen HYDROcodone-acetaminophen (NORCO) 10-325 MG per tablet Take 1 tablet by mouth 3 (three) times daily. Radiation pain    . lisinopril (PRINIVIL,ZESTRIL) 40 MG tablet Take 20 mg by mouth at bedtime.     Marland Kitchen LORazepam (ATIVAN) 1 MG tablet Take 0.5 mg by mouth at bedtime.    . meloxicam (MOBIC) 7.5 MG tablet Take 7.5 mg by mouth at bedtime.    . pantoprazole (PROTONIX) 40 MG tablet Take 40 mg by mouth daily.     . polyethylene glycol (MIRALAX / GLYCOLAX) packet Take 17 g by mouth 3 (three) times a week.    . traZODone (DESYREL) 50 MG tablet Take 25 mg by mouth at bedtime.      No current facility-administered medications for this visit.       Physical Exam: Ht 5\' 4"  (1.626 m)  SpO2   General appearance: alert and cooperative Neurologic: intact Heart: regular rate and rhythm, S1, S2  normal, no murmur, click, rub or gallop and normal apical impulse Lungs: clear to auscultation bilaterally Wound: Her left chest incision is well-healed she has full range of motion of her left shoulder I do not appreciate any cervical supraclavicular or axillary adenopathy  Diagnostic Studies & Laboratory data:         Recent Radiology Findings: Ct Chest Wo Contrast  01/09/2014   CLINICAL DATA:  History of left-sided lung cancer with resection in 2012. Subsequent encounter.  EXAM: CT CHEST WITHOUT CONTRAST  TECHNIQUE: Multidetector CT imaging of the chest was performed following the standard protocol without IV contrast.  COMPARISON:  Chest CT 12/27/2012.  FINDINGS: Mediastinum: There are no enlarged mediastinal or hilar lymph nodes. The thyroid gland, trachea and esophagus demonstrate no significant findings. The heart size is normal. There is no pericardial effusion.No significant vascular findings are demonstrated on non contrast imaging.  Lungs/Pleura: There is no pleural effusion.There is stable pleural thickening medially at the left apex status post left upper lobe resection. There is stable calcification at the left lung base. Moderate emphysematous changes are present. There is no suspicious pulmonary nodule or confluent airspace opacity. There is no endobronchial lesion.  Upper abdomen:  Stable.  No  evidence of adrenal mass.  Musculoskeletal/Chest wall: There is no axillary adenopathy or chest wall mass. There is stable sclerosis of the manubrium status post upper sternotomy. The first rib has been partial resected on the left. Lucencies within the remaining rib are stable. No worrisome osseous findings demonstrated.  IMPRESSION: 1. Stable postsurgical changes status post left upper lobe resection and partial resection of the left first rib. 2. No evidence of local recurrence or metastatic disease. 3. Emphysema.   Electronically Signed   By: Camie Patience M.D.   On: 01/09/2014 11:24       Recent Labs: Lab Results  Component Value Date   WBC 7.1 09/08/2013   HGB 12.9 09/08/2013   HCT 39.4 09/08/2013   PLT 329 09/08/2013   GLUCOSE 101* 09/08/2013   ALT 15 01/23/2012   AST 14 01/23/2012   NA 141 09/08/2013   K 4.0 09/08/2013   CL 102 09/08/2013   CREATININE 0.80 09/08/2013   BUN 22 09/08/2013   CO2 29 09/08/2013   TSH 2.05 12/13/2012   INR 0.93 01/23/2012      Assessment / Plan:     Patient is 2.5 years  months following transmural chest wall resection and left upper lobectomy for moderately differentiated adenocarcinoma/Pancoast tumor  ypTX , pN0 By Physical exam and CT there is no evidence of recurrent disease. I will plan to see her back in 12 months in the office with a repeat CT scan of the chest  Grace Isaac MD 01/09/2014 1:07 PM

## 2014-01-23 ENCOUNTER — Ambulatory Visit: Payer: BC Managed Care – PPO | Admitting: Cardiothoracic Surgery

## 2014-01-23 ENCOUNTER — Other Ambulatory Visit: Payer: BC Managed Care – PPO

## 2014-02-06 ENCOUNTER — Encounter (HOSPITAL_COMMUNITY): Payer: Self-pay | Admitting: Internal Medicine

## 2014-03-27 ENCOUNTER — Encounter: Payer: Self-pay | Admitting: Internal Medicine

## 2014-06-02 ENCOUNTER — Other Ambulatory Visit: Payer: Self-pay | Admitting: Gastroenterology

## 2014-09-30 ENCOUNTER — Ambulatory Visit: Payer: Self-pay | Admitting: Physical Therapy

## 2014-10-02 ENCOUNTER — Ambulatory Visit: Payer: Self-pay | Admitting: Physical Therapy

## 2014-10-14 ENCOUNTER — Ambulatory Visit: Payer: BLUE CROSS/BLUE SHIELD | Attending: Neurosurgery | Admitting: Physical Therapy

## 2014-10-14 DIAGNOSIS — M545 Low back pain: Secondary | ICD-10-CM | POA: Diagnosis not present

## 2014-10-14 DIAGNOSIS — M5386 Other specified dorsopathies, lumbar region: Secondary | ICD-10-CM

## 2014-10-14 DIAGNOSIS — M2560 Stiffness of unspecified joint, not elsewhere classified: Secondary | ICD-10-CM | POA: Diagnosis present

## 2014-10-14 NOTE — Therapy (Signed)
[name], [place], [address] Phone: [phone]   Fax:  [phone]  Physical Therapy Evaluation  Patient Details  Name: [name] MRN: [id number] Date of Birth: 1961-01-14 Referring Provider:  Glenna Fellows, MD  Encounter Date: 10/14/2014      PT End of Session - 10/14/14 1211    Visit Number 1   Number of Visits 18   Date for PT Re-Evaluation 12/02/14   PT Start Time 1120   PT Stop Time 1206   PT Time Calculation (min) 46 min   Activity Tolerance Patient tolerated treatment well   Behavior During Therapy [facility] for tasks assessed/performed      Past Medical History  Diagnosis Date  . Lung cancer     pancoast tumor  . Hypertension   . Anxiety     Past Surgical History  Procedure Laterality Date  . Thoracotomy  [id number]    transsmanubrial anterolaterl thoracotomy with chest wall resection includinf the first rib and left  upper lobectomy for Pancoast turmor  . Portacath placement  [id number]    Rt   IJ  Port-A-Cath  dr.burney  . Tubal ligation    . Sternal wires removal  01/23/2012    Procedure: STERNAL WIRES REMOVAL;  Surgeon: Grace Isaac, MD;  Location: [place];  Service: Thoracic;  Laterality: N/A;  . Colonoscopy  12/2010    in [place], per patient, normal, next colonoscopy 10 years  . Laparoscopic appendectomy N/A 06/21/2013    Procedure: APPENDECTOMY LAPAROSCOPIC;  Surgeon: Jamesetta So, MD;  [facility]: [facility];  Service: General;  Laterality: N/A;  . Foreign body removal N/A 09/08/2013    RMR:Mid esophageal stricture-likely radiation-induced/Schatzki's ring. Hiatal hernia  . Esophagogastroduodenoscopy N/A 10/16/2013    Procedure: ESOPHAGOGASTRODUODENOSCOPY (EGD);  Surgeon: Daneil Dolin, MD;  [facility];  Service: Endoscopy;  Laterality: N/A;  1230-rescheduled 9/23 @ [place] notified pt  . Maloney dilation N/A 10/16/2013    Procedure: Venia Minks DILATION;  Surgeon:  Daneil Dolin, MD;  [facility];  Service: Endoscopy;  Laterality: N/A;    There were no vitals filed for this visit.  Visit Diagnosis:  Right low back pain, with sciatica presence unspecified - Plan: PT plan of care cert/re-cert  Decreased range of motion of lumbar spine - Plan: PT plan of care cert/re-cert      Subjective Assessment - 10/14/14 1127    Subjective Low back hurts al the time.   Limitations Sitting;Standing   How long can you sit comfortably? 15 minutes.   How long can you stand comfortably? 15 minutes.   Currently in Pain? Yes   Pain Score 8    Pain Location Back   Pain Orientation Right;Left;Mid   Pain Descriptors / Indicators Aching;Throbbing   Pain Type Chronic pain   Pain Onset More than a month ago   Pain Frequency Constant   Aggravating Factors  Lifting.   Pain Relieving Factors Back brace; Hot and cold and medication.            [facility] PT Assessment - 10/14/14 0001    Assessment   Medical Diagnosis Herniated lumbar disc.   Onset Date/Surgical Date --  Ongoing.   Precautions   Precautions None   Restrictions   Weight Bearing Restrictions No   Balance Screen   Has the patient fallen in the past 6 months No   Has the patient had a decrease in activity level because of  a fear of falling?  No   Is the patient reluctant to leave their home because of a fear of falling?  No   Home Ec[employer] residence   Prior Function   Level of Independence Independent   Posture/Postural Control   Posture/Postural Control Postural limitations   Postural Limitations Decreased lumbar lordosis   ROM / Strength   AROM / PROM / Strength AROM   AROM   Overall AROM Comments 40% decrease in lumbar intervertebral movement moving into flexion and 18 degrees of active lumbar extension.   Palpation   Palpation comment Tender to palpation over bilateral multifidus musculature; bilateral SIJ's and L5-S1 junction.   Special Tests     Special Tests Lumbar;Sacrolliac Tests  Normal bilateral LE DTR's.   Lumbar Tests Straight Leg Raise   Sacroiliac Tests  --  Negative bilateral FABER testing.   Straight Leg Raise   Findings Negative   Side  --  Bilateral.   Ambulation/Gait   Gait Comments Normal gait cycle.                   [facility] Adult PT Treatment/Exercise - 10/14/14 0001    Manual Therapy   Manual Therapy Myofascial release   Manual therapy comments In prone over 2 pillows:  STW/M to bilateral lumbar musculature including multifidus release technique and sacral ligament STW bilateral x 14 minutes.                PT Education - 10/14/14 1211    Education provided Yes   Person(s) Educated Patient   Methods Explanation   Comprehension Verbalized understanding             PT Long Term Goals - 10/14/14 1229    PT LONG TERM GOAL #1   Title Ind with HEP.   Time 6   Period Weeks   Status New   PT LONG TERM GOAL #2   Title Eliminate LE symptoms.   Time 6   Period Weeks   Status New   PT LONG TERM GOAL #3   Title Sit 30 minutes with pain not > 3/10.   Time 6   Period Weeks   Status New   PT LONG TERM GOAL #4   Title Stand 20 minutes with pain not > 3/10.   Time 6   Period Weeks   Status New   PT LONG TERM GOAL #5   Title Sleep 6 hours undisturbed.   Time 6   Period Weeks   Status New   Additional Long Term Goals   Additional Long Term Goals Yes   PT LONG TERM GOAL #6   Title Perform ADL's with pain not > 3/10.               Plan - 10/14/14 1212    Clinical Impression Statement The patient has a long h/o of low back pain with radiation of pain into bilateral LE's (left>right) with pain into left LE to calf.  Patient can reach as high as a 10/10 at times.  She works at a [employer], learning disability an dher job can be very strenuous.     Pt will benefit from skilled therapeutic intervention in order to improve on the following deficits Pain;Decreased activity tolerance   Rehab  Potential Excellent   PT Frequency 3x / week   PT Duration 6 weeks   PT Treatment/Interventions ADLs/Self Care Home Management;Cryotherapy;Electrical Stimulation;Moist Heat;Traction;Ultrasound;Therapeutic activities;Therapeutic exercise;Patient/family education;Manual techniques   PT  Next Visit Plan Prone over 2 pillows:  STW/M to bilateral lumbar musculature and SIJ's and L5-S1 junction.  S and DKTC exercise; hip bridges; Standing extension starting from approximately 25 degrees of flexion.  Combo E'stim//U/S and intermittment traction beginning at  70#.         Problem List Patient Active Problem List   Diagnosis Date Noted  . Esophageal dysphagia 09/25/2013  . Hiccups 04/19/2013  . Unspecified constipation 04/18/2013  . Anxiety   . Hypertension   . Lung cancer     APPLEGATE, [place] MPT 10/14/2014, 12:35 PM  [facility] [address] [place], [place], [address] Phone: [phone]   Fax:  [phone]

## 2014-10-14 NOTE — Patient Instructions (Signed)
Sleeping with pillows between knees.

## 2014-10-16 ENCOUNTER — Encounter: Payer: BLUE CROSS/BLUE SHIELD | Admitting: *Deleted

## 2014-10-21 ENCOUNTER — Encounter: Payer: Self-pay | Admitting: *Deleted

## 2014-10-21 ENCOUNTER — Ambulatory Visit: Payer: BLUE CROSS/BLUE SHIELD | Admitting: *Deleted

## 2014-10-21 DIAGNOSIS — M545 Low back pain: Secondary | ICD-10-CM

## 2014-10-21 NOTE — Therapy (Signed)
[name] [place], [place], [address] Phone: [phone]   Fax:  [phone]  Physical Therapy Treatment  Patient Details  Name: [name] MRN: [id number] Date of Birth: [birth year] Referring Provider:  Terald Sleeper, PA-C  Encounter Date: 10/21/2014      PT End of Session - 10/21/14 1654    Visit Number 2   Number of Visits 18   Date for PT Re-Evaluation 12/02/14   PT Start Time 1600   PT Stop Time 8299   PT Time Calculation (min) 47 min      Past Medical History  Diagnosis Date  . Lung cancer     pancoast tumor  . Hypertension   . Anxiety     Past Surgical History  Procedure Laterality Date  . Thoracotomy  [id number]    transsmanubrial anterolaterl thoracotomy with chest wall resection includinf the first rib and left  upper lobectomy for Pancoast turmor  . Portacath placement  [id number]    Rt   IJ  Port-A-Cath  dr.burney  . Tubal ligation    . Sternal wires removal  01/23/2012    Procedure: STERNAL WIRES REMOVAL;  Surgeon: Grace Isaac, MD;  Location: [place];  Service: Thoracic;  Laterality: N/A;  . Colonoscopy  12/2010    in [place], per patient, normal, next colonoscopy 10 years  . Laparoscopic appendectomy N/A 06/21/2013    Procedure: APPENDECTOMY LAPAROSCOPIC;  Surgeon: Jamesetta So, MD;  [facility]: [facility];  Service: General;  Laterality: N/A;  . Foreign body removal N/A 09/08/2013    RMR:Mid esophageal stricture-likely radiation-induced/Schatzki's ring. Hiatal hernia  . Esophagogastroduodenoscopy N/A 10/16/2013    Procedure: ESOPHAGOGASTRODUODENOSCOPY (EGD);  Surgeon: Daneil Dolin, MD;  [facility];  Service: Endoscopy;  Laterality: N/A;  1230-rescheduled 9/23 @ [place] notified pt  . Maloney dilation N/A 10/16/2013    Procedure: Venia Minks DILATION;  Surgeon: Daneil Dolin, MD;  [facility];  Service: Endoscopy;  Laterality: N/A;    There were no vitals  filed for this visit.  Visit Diagnosis:  Right low back pain, with sciatica presence unspecified      Subjective Assessment - 10/21/14 1648    Subjective Low back hurts al the time.   Limitations Sitting;Standing   How long can you sit comfortably? 15 minutes.   How long can you stand comfortably? 15 minutes.   Currently in Pain? Yes   Pain Score 7    Pain Location Back   Pain Orientation Right;Left;Mid   Pain Descriptors / Indicators Aching;Throbbing   Pain Type Chronic pain   Pain Onset More than a month ago   Pain Frequency Constant   Aggravating Factors  Lifting   Pain Relieving Factors back brace and meds                         [facility] Adult PT Treatment/Exercise - 10/21/14 0001    Exercises   Exercises Lumbar   Lumbar Exercises: Supine   Ab Set 15 reps;5 seconds  Draw-in   Bent Knee Raise 20 reps;5 seconds  marching with drawin   Lumbar Exercises: Prone   Straight Leg Raise 10 reps;3 seconds  prone over 2 pillows   Modalities   Modalities Traction   Traction   Type of Traction Lumbar   Min (lbs) 5   Max (lbs) 70   Hold Time 99   Rest Time 5   Time  15   Manual Therapy   Manual Therapy Myofascial release   Manual therapy comments In prone over 2 pillows:  STW/M to bilateral lumbar musculature including multifidus release technique and sacral ligament STW bilateral x 14 minutes.                PT Education - 10/21/14 1650    Education provided Yes   Education Details core exs   Person(s) Educated Patient   Methods Explanation;Demonstration;Verbal cues;Handout;Tactile cues   Comprehension Verbalized understanding;Returned demonstration             PT Long Term Goals - 10/14/14 1229    PT LONG TERM GOAL #1   Title Ind with HEP.   Time 6   Period Weeks   Status New   PT LONG TERM GOAL #2   Title Eliminate LE symptoms.   Time 6   Period Weeks   Status New   PT LONG TERM GOAL #3   Title Sit 30 minutes with pain not >  3/10.   Time 6   Period Weeks   Status New   PT LONG TERM GOAL #4   Title Stand 20 minutes with pain not > 3/10.   Time 6   Period Weeks   Status New   PT LONG TERM GOAL #5   Title Sleep 6 hours undisturbed.   Time 6   Period Weeks   Status New   Additional Long Term Goals   Additional Long Term Goals Yes   PT LONG TERM GOAL #6   Title Perform ADL's with pain not > 3/10.               Plan - 10/21/14 1820    Clinical Impression Statement Pt did great today with STW, core activation exs, and 70#s of traction. She had notable tightness on both sides of LB paras, but did wekll with STW. Goals sre ongoing at this time. Pt did well with HEP.   Pt will benefit from skilled therapeutic intervention in order to improve on the following deficits Pain;Decreased activity tolerance   Rehab Potential Excellent   PT Frequency 3x / week   PT Duration 6 weeks   PT Treatment/Interventions ADLs/Self Care Home Management;Cryotherapy;Electrical Stimulation;Moist Heat;Traction;Ultrasound;Therapeutic activities;Therapeutic exercise;Patient/family education;Manual techniques   PT Next Visit Plan Prone over 2 pillows:  STW/M to bilateral lumbar musculature and SIJ's and L5-S1 junction.  S and DKTC exercise; hip bridges; Standing extension starting from approximately 25 degrees of flexion.  Combo E'stim//U/S and intermittment traction beginning at  70#.   Consulted and Agree with Plan of Care Patient        Problem List Patient Active Problem List   Diagnosis Date Noted  . Esophageal dysphagia 09/25/2013  . Hiccups 04/19/2013  . Unspecified constipation 04/18/2013  . Anxiety   . Hypertension   . Lung cancer     RAMSEUR,CHRISPTA 10/21/2014, 6:25 PM  [name] [address] [place], [place], [address] Phone: [phone]   Fax:  [phone]

## 2014-10-21 NOTE — Patient Instructions (Signed)
Isometric Abdominal   Lying on back with knees bent, tighten stomach by pulling navel down. Hold ____ seconds. Repeat __5__ times per set. Do __5__ sets per session. Do _3-5___ sessions per day.  http://orth.exer.us/1086   Copyright  VHI. All rights reserved.  Bent Leg Lift (Hook-Lying)   Tighten stomach and slowly raise right leg _6___ inches from floor. Keep trunk rigid. Hold _2-3___ seconds. Repeat _10___ times per set. Do _3___ sets per session. Do _2-3___ sessions per day.  http://orth.exer.us/1090   Copyright  VHI. All rights reserved.  Bridging   Slowly raise buttocks from floor, keeping stomach tight. Repeat __10__ times per set. Do _3___ sets per session. Do __2-3__ sessions per day.  http://orth.exer.us/1096   Copyright  VHI. All rights reserved.  Bridging   Slowly raise buttocks from floor, keeping stomach tight. Repeat ____ times per set. Do ____ sets per session. Do ____ sessions per day.  http://orth.exer.us/1096   Copyright  VHI. All rights reserved.  Straight Leg Raise (Prone)   Abdomen and head supported, keep left knee locked and raise leg at hip. Avoid arching low back. Repeat _10___ times per set. Do _3___ sets per session. Do _2-3___ sessions per day.  http://orth.exer.us/1112   Copyright  VHI. All rights reserved.  Opposite Arm / Leg Lift (Prone)   Abdomen and head supported, left knee locked, raise leg and opposite arm ____ inches from floor. Repeat _10___ times per set. Do 3____ sets per session. Do __2-3__ sessions per day.  http://orth.exer.us/1114   Copyright  VHI. All rights reserved.  Combination (Hook-Lying)   Tighten stomach and slowly raise left leg and lower opposite arm over head. Keep trunk rigid. Repeat _10___ times per set. Do _3___ sets per session. Do _2-3___ sessions per day.  http://orth.exer.us/1092   Copyright  VHI. All rights reserved.  Upper / Lower Extremity Extension (All-Fours)   Tighten stomach  and raise right leg and opposite arm. Keep trunk rigid. Repeat __10__ times per set. Do __3__ sets per session. Do _2-3___ sessions per day.  http://orth.exer.us/1118   Copyright  VHI. All rights reserved.

## 2014-10-23 ENCOUNTER — Ambulatory Visit: Payer: BLUE CROSS/BLUE SHIELD | Admitting: Physical Therapy

## 2014-10-23 DIAGNOSIS — M545 Low back pain: Secondary | ICD-10-CM

## 2014-10-23 DIAGNOSIS — M5386 Other specified dorsopathies, lumbar region: Secondary | ICD-10-CM

## 2014-10-23 NOTE — Therapy (Signed)
[name] [place], [place], [address] Phone: [phone]   Fax:  [phone]  Physical Therapy Treatment  Patient Details  Name: [name] MRN: [id number] Date of Birth: 1960/02/28 Referring Provider:  Terald Sleeper, PA-C  Encounter Date: 10/23/2014      PT End of Session - 10/23/14 1749    Visit Number 3   Number of Visits 18   Date for PT Re-Evaluation 12/02/14   PT Start Time 0400   PT Stop Time 0452   PT Time Calculation (min) 52 min   Activity Tolerance Patient tolerated treatment well   Behavior During Therapy [facility] for tasks assessed/performed      Past Medical History  Diagnosis Date  . Lung cancer     pancoast tumor  . Hypertension   . Anxiety     Past Surgical History  Procedure Laterality Date  . Thoracotomy  [id number]    transsmanubrial anterolaterl thoracotomy with chest wall resection includinf the first rib and left  upper lobectomy for Pancoast turmor  . Portacath placement  [id number]    Rt   IJ  Port-A-Cath  dr.burney  . Tubal ligation    . Sternal wires removal  01/23/2012    Procedure: STERNAL WIRES REMOVAL;  Surgeon: Grace Isaac, MD;  Location: [place];  Service: Thoracic;  Laterality: N/A;  . Colonoscopy  12/2010    in [place], per patient, normal, next colonoscopy 10 years  . Laparoscopic appendectomy N/A 06/21/2013    Procedure: APPENDECTOMY LAPAROSCOPIC;  Surgeon: Jamesetta So, MD;  [facility]: [facility];  Service: General;  Laterality: N/A;  . Foreign body removal N/A 09/08/2013    RMR:Mid esophageal stricture-likely radiation-induced/Schatzki's ring. Hiatal hernia  . Esophagogastroduodenoscopy N/A 10/16/2013    Procedure: ESOPHAGOGASTRODUODENOSCOPY (EGD);  Surgeon: Daneil Dolin, MD;  [facility];  Service: Endoscopy;  Laterality: N/A;  1230-rescheduled 9/23 @ [place] notified pt  . Maloney dilation N/A 10/16/2013    Procedure: Venia Minks DILATION;   Surgeon: Daneil Dolin, MD;  [facility];  Service: Endoscopy;  Laterality: N/A;    There were no vitals filed for this visit.  Visit Diagnosis:  Right low back pain, with sciatica presence unspecified  Decreased range of motion of lumbar spine      Subjective Assessment - 10/23/14 1737    Subjective The traction made me very sore but i felt better the next day.  Patient would like to skip it though as she has to car for her 2 grandchildren tonight.   Pain Score 5    Pain Location Back   Pain Orientation Right;Left;Mid   Pain Descriptors / Indicators Aching;Throbbing   Pain Type Chronic pain   Pain Onset More than a month ago                         [facility] Adult PT Treatment/Exercise - 10/23/14 0001    Manual Therapy   Manual Therapy Myofascial release   Manual therapy comments In prone over 2 pillows   Myofascial Release STW/M x 39 minutes to patient's affected lumbar musculature.                     PT Long Term Goals - 10/14/14 1229    PT LONG TERM GOAL #1   Title Ind with HEP.   Time 6   Period Weeks   Status New   PT LONG  TERM GOAL #2   Title Eliminate LE symptoms.   Time 6   Period Weeks   Status New   PT LONG TERM GOAL #3   Title Sit 30 minutes with pain not > 3/10.   Time 6   Period Weeks   Status New   PT LONG TERM GOAL #4   Title Stand 20 minutes with pain not > 3/10.   Time 6   Period Weeks   Status New   PT LONG TERM GOAL #5   Title Sleep 6 hours undisturbed.   Time 6   Period Weeks   Status New   Additional Long Term Goals   Additional Long Term Goals Yes   PT LONG TERM GOAL #6   Title Perform ADL's with pain not > 3/10.               Problem List Patient Active Problem List   Diagnosis Date Noted  . Esophageal dysphagia 09/25/2013  . Hiccups 04/19/2013  . Unspecified constipation 04/18/2013  . Anxiety   . Hypertension   . Lung cancer     APPLEGATE, Mali MPT 10/23/2014, 5:52  PM  [facility] [address] [place], [place], [address] Phone: [phone]   Fax:  [phone]

## 2014-10-27 ENCOUNTER — Ambulatory Visit: Payer: BLUE CROSS/BLUE SHIELD | Attending: Neurosurgery | Admitting: Physical Therapy

## 2014-10-27 DIAGNOSIS — M2560 Stiffness of unspecified joint, not elsewhere classified: Secondary | ICD-10-CM | POA: Insufficient documentation

## 2014-10-27 DIAGNOSIS — M545 Low back pain: Secondary | ICD-10-CM

## 2014-10-27 DIAGNOSIS — M5386 Other specified dorsopathies, lumbar region: Secondary | ICD-10-CM

## 2014-10-27 NOTE — Therapy (Signed)
[name] [place], [place], [address] Phone: [phone]   Fax:  [phone]  Physical Therapy Treatment  Patient Details  Name: [name] MRN: [id number] Date of Birth: Apr 22, 1960 Referring Provider:  Terald Sleeper, PA-C  Encounter Date: 10/27/2014      PT End of Session - 10/27/14 1707    Visit Number 3   Number of Visits 18   Date for PT Re-Evaluation 12/02/14   PT Start Time 0404   PT Stop Time 0451   PT Time Calculation (min) 47 min   Activity Tolerance Patient tolerated treatment well   Behavior During Therapy [facility] for tasks assessed/performed      Past Medical History  Diagnosis Date  . Lung cancer     pancoast tumor  . Hypertension   . Anxiety     Past Surgical History  Procedure Laterality Date  . Thoracotomy  [id number]    transsmanubrial anterolaterl thoracotomy with chest wall resection includinf the first rib and left  upper lobectomy for Pancoast turmor  . Portacath placement  [id number]    Rt   IJ  Port-A-Cath  dr.burney  . Tubal ligation    . Sternal wires removal  01/23/2012    Procedure: STERNAL WIRES REMOVAL;  Surgeon: Grace Isaac, MD;  Location: [facility];  Service: Thoracic;  Laterality: N/A;  . Colonoscopy  12/2010    in [place], per patient, normal, next colonoscopy 10 years  . Laparoscopic appendectomy N/A 06/21/2013    Procedure: APPENDECTOMY LAPAROSCOPIC;  Surgeon: Jamesetta So, MD;  [facility]: [facility];  Service: General;  Laterality: N/A;  . Foreign body removal N/A 09/08/2013    RMR:Mid esophageal stricture-likely radiation-induced/Schatzki's ring. Hiatal hernia  . Esophagogastroduodenoscopy N/A 10/16/2013    Procedure: ESOPHAGOGASTRODUODENOSCOPY (EGD);  Surgeon: Daneil Dolin, MD;  [facility];  Service: Endoscopy;  Laterality: N/A;  1230-rescheduled 9/23 @ [place] notified pt  . Maloney dilation N/A 10/16/2013    Procedure: Venia Minks DILATION;   Surgeon: Daneil Dolin, MD;  [facility];  Service: Endoscopy;  Laterality: N/A;    There were no vitals filed for this visit.  Visit Diagnosis:  Right low back pain, with sciatica presence unspecified  Decreased range of motion of lumbar spine      Subjective Assessment - 10/27/14 1706    Subjective Did well after last treatment.   Limitations Sitting;Standing   How long can you sit comfortably? 15 minutes.   How long can you stand comfortably? 15 minutes.   Pain Score 4    Pain Location Back   Pain Orientation Right;Left;Mid   Pain Descriptors / Indicators Aching   Pain Type Chronic pain   Pain Onset More than a month ago   Pain Frequency Constant                                      PT Long Term Goals - 10/14/14 1229    PT LONG TERM GOAL #1   Title Ind with HEP.   Time 6   Period Weeks   Status New   PT LONG TERM GOAL #2   Title Eliminate LE symptoms.   Time 6   Period Weeks   Status New   PT LONG TERM GOAL #3   Title Sit 30 minutes with pain not > 3/10.   Time 6  Period Weeks   Status New   PT LONG TERM GOAL #4   Title Stand 20 minutes with pain not > 3/10.   Time 6   Period Weeks   Status New   PT LONG TERM GOAL #5   Title Sleep 6 hours undisturbed.   Time 6   Period Weeks   Status New   Additional Long Term Goals   Additional Long Term Goals Yes   PT LONG TERM GOAL #6   Title Perform ADL's with pain not > 3/10.               Problem List Patient Active Problem List   Diagnosis Date Noted  . Esophageal dysphagia 09/25/2013  . Hiccups 04/19/2013  . Unspecified constipation 04/18/2013  . Anxiety   . Hypertension   . Lung cancer ([place])    Treatment:  Prone over 2 pillows for comfort while patient received U/S at 1.50 W/CM@ x 14 minutes to patient's affected lumbar musculature f/b STW/M TP release technique and PA mobs x 24 minutes.  Patient tolerated treatment well.  Reyn Faivre, [place]  MPT 10/27/2014, 5:07 PM  [facility] [address] [place], [place], [address] Phone: [phone]   Fax:  [phone]

## 2014-10-30 ENCOUNTER — Encounter: Payer: BLUE CROSS/BLUE SHIELD | Admitting: Physical Therapy

## 2014-11-04 ENCOUNTER — Ambulatory Visit: Payer: BLUE CROSS/BLUE SHIELD | Admitting: Physical Therapy

## 2014-11-04 DIAGNOSIS — M545 Low back pain: Secondary | ICD-10-CM | POA: Diagnosis not present

## 2014-11-04 DIAGNOSIS — M2560 Stiffness of unspecified joint, not elsewhere classified: Secondary | ICD-10-CM | POA: Diagnosis present

## 2014-11-04 DIAGNOSIS — M5386 Other specified dorsopathies, lumbar region: Secondary | ICD-10-CM

## 2014-11-04 NOTE — Therapy (Signed)
[name] [place], [place], [address] Phone: [phone]   Fax:  [phone]  Physical Therapy Treatment  Patient Details  Name: [name] MRN: [id number] Date of Birth: [birth year] Referring Provider:  Terald Sleeper, PA-C  Encounter Date: 11/04/2014      PT End of Session - 11/04/14 1703    Visit Number 4   Number of Visits 18   Date for PT Re-Evaluation 12/02/14   PT Start Time 0403   PT Stop Time 0453   PT Time Calculation (min) 50 min   Activity Tolerance Patient tolerated treatment well   Behavior During Therapy [facility] for tasks assessed/performed      Past Medical History  Diagnosis Date  . Lung cancer     pancoast tumor  . Hypertension   . Anxiety     Past Surgical History  Procedure Laterality Date  . Thoracotomy  [id number]    transsmanubrial anterolaterl thoracotomy with chest wall resection includinf the first rib and left  upper lobectomy for Pancoast turmor  . Portacath placement  [id number]    Rt   IJ  Port-A-Cath  dr.burney  . Tubal ligation    . Sternal wires removal  01/23/2012    Procedure: STERNAL WIRES REMOVAL;  Surgeon: Grace Isaac, MD;  Location: [facility];  Service: Thoracic;  Laterality: N/A;  . Colonoscopy  12/2010    in [place], per patient, normal, next colonoscopy 10 years  . Laparoscopic appendectomy N/A 06/21/2013    Procedure: APPENDECTOMY LAPAROSCOPIC;  Surgeon: Jamesetta So, MD;  [facility]: [facility];  Service: General;  Laterality: N/A;  . Foreign body removal N/A 09/08/2013    RMR:Mid esophageal stricture-likely radiation-induced/Schatzki's ring. Hiatal hernia  . Esophagogastroduodenoscopy N/A 10/16/2013    Procedure: ESOPHAGOGASTRODUODENOSCOPY (EGD);  Surgeon: Daneil Dolin, MD;  [facility];  Service: Endoscopy;  Laterality: N/A;  1230-rescheduled 9/23 @ [place] notified pt  . Maloney dilation N/A 10/16/2013    Procedure: Venia Minks DILATION;   Surgeon: Daneil Dolin, MD;  [facility];  Service: Endoscopy;  Laterality: N/A;    There were no vitals filed for this visit.  Visit Diagnosis:  Right low back pain, with sciatica presence unspecified  Decreased range of motion of lumbar spine      Subjective Assessment - 11/04/14 1702    Subjective Woke up last night at 4 am with back pain.  Pain rated at 5/10 now.  I'm thinking about getting an injection.   Limitations Sitting;Standing   How long can you sit comfortably? 15 minutes.   How long can you stand comfortably? 15 minutes.                         [facility] Adult PT Treatment/Exercise - 11/04/14 1706    Manual Therapy   Myofascial Release STW/M x 29 minutes to patient's affected lumbar musculature.and bilateral SIJ's.                     PT Long Term Goals - 11/04/14 1710    PT LONG TERM GOAL #1   Title Ind with HEP.   Time 6   Period Weeks   Status On-going   PT LONG TERM GOAL #2   Title Eliminate LE symptoms.   Time 6   Period Weeks   Status On-going   PT LONG TERM GOAL #3   Title Sit 30 minutes with pain  not > 3/10.   Time 6   Period Weeks   Status On-going   PT LONG TERM GOAL #4   Title Stand 20 minutes with pain not > 3/10.   Time 6   Period Weeks   Status On-going   PT LONG TERM GOAL #5   Title Sleep 6 hours undisturbed.   Time 6   Period Weeks   Status On-going   PT LONG TERM GOAL #6   Title Perform ADL's with pain not > 3/10.   Time 6   Period Weeks   Status On-going               Plan - 11/04/14 1706    Clinical Impression Statement Patient has responded favorable to treatments but is still waking up with pain and her am pain-level is quite high.  She reported a 5/10 pain-level today.   Pt will benefit from skilled therapeutic intervention in order to improve on the following deficits Pain;Decreased activity tolerance   Rehab Potential Excellent   PT Frequency 3x / week   PT Duration 6  weeks   PT Treatment/Interventions ADLs/Self Care Home Management;Cryotherapy;Electrical Stimulation;Moist Heat;Traction;Ultrasound;Therapeutic activities;Therapeutic exercise;Patient/family education;Manual techniques   PT Next Visit Plan May try int traction at 70# again.   Consulted and Agree with Plan of Care Patient        Problem List Patient Active Problem List   Diagnosis Date Noted  . Esophageal dysphagia 09/25/2013  . Hiccups 04/19/2013  . Unspecified constipation 04/18/2013  . Anxiety   . Hypertension   . Lung cancer ([place])     Darcie Mellone, [place] MPT 11/04/2014, 5:12 PM  [facility] [address] [place], [place], [address] Phone: [phone]   Fax:  [phone]

## 2014-11-06 ENCOUNTER — Ambulatory Visit: Payer: BLUE CROSS/BLUE SHIELD | Admitting: Physical Therapy

## 2014-12-12 ENCOUNTER — Other Ambulatory Visit: Payer: Self-pay | Admitting: *Deleted

## 2014-12-12 DIAGNOSIS — C3490 Malignant neoplasm of unspecified part of unspecified bronchus or lung: Secondary | ICD-10-CM

## 2015-01-06 ENCOUNTER — Other Ambulatory Visit: Payer: BLUE CROSS/BLUE SHIELD

## 2015-01-06 ENCOUNTER — Ambulatory Visit: Payer: BLUE CROSS/BLUE SHIELD | Admitting: Cardiothoracic Surgery

## 2015-01-09 ENCOUNTER — Ambulatory Visit: Payer: BLUE CROSS/BLUE SHIELD | Admitting: Cardiothoracic Surgery

## 2015-01-15 ENCOUNTER — Ambulatory Visit
Admission: RE | Admit: 2015-01-15 | Discharge: 2015-01-15 | Disposition: A | Discharge status: Home / Self Care | Payer: BLUE CROSS/BLUE SHIELD | Source: Ambulatory Visit | Attending: Cardiothoracic Surgery | Admitting: Cardiothoracic Surgery

## 2015-01-15 ENCOUNTER — Ambulatory Visit (INDEPENDENT_AMBULATORY_CARE_PROVIDER_SITE_OTHER): Payer: BLUE CROSS/BLUE SHIELD | Admitting: Cardiothoracic Surgery

## 2015-01-15 VITALS — BP 149/102 | HR 76 | Resp 20 | Ht 64.0 in | Wt 143.0 lb

## 2015-01-15 DIAGNOSIS — C3490 Malignant neoplasm of unspecified part of unspecified bronchus or lung: Secondary | ICD-10-CM

## 2015-01-15 DIAGNOSIS — Z902 Acquired absence of lung [part of]: Secondary | ICD-10-CM

## 2015-01-15 DIAGNOSIS — C3412 Malignant neoplasm of upper lobe, left bronchus or lung: Secondary | ICD-10-CM | POA: Diagnosis not present

## 2015-01-15 NOTE — Progress Notes (Signed)
[name] Record #[id number] Date of Birth: 1960/12/06  Octavio Graves, DO Terald Sleeper, PA-C  Chief Complaint:   PostOp Follow Up Visit 06/25/2010  PREOPERATIVE DIAGNOSIS: Pancoast tumor.  POSTOPERATIVE DIAGNOSIS: Pancoast tumor.  PROCEDURE PERFORMED: Transmanubrial anterolateral thoracotomy with  chest wall resection including the first rib and left upper lobectomy  for Pancoast tumor.  ypTX , pN0   History of Present Illness:      Patient returns for followup visit today now little over a 4 1/2 years  after resection of Pancoast tumor through a trans-manubrial anterolateral thoracotomy with chest wall resection. She's done well postoperatively notes that she has full range of motion and function of her left arm. She is not smoking   History  Smoking status  . Former Smoker  . Quit date: 02/04/2010  Smokeless tobacco  . Never Used       No Known Allergies  Current Outpatient Prescriptions  Medication Sig Dispense Refill  . buPROPion (WELLBUTRIN XL) 150 MG 24 hr tablet Take 150 mg by mouth daily.    . DULoxetine (CYMBALTA) 30 MG capsule Take 60 mg by mouth 2 (two) times daily.     Marland Kitchen HYDROcodone-acetaminophen (NORCO) 10-325 MG per tablet Take 1 tablet by mouth 3 (three) times daily. Radiation pain    . lisinopril (PRINIVIL,ZESTRIL) 40 MG tablet Take 40 mg by mouth daily.     Marland Kitchen LORazepam (ATIVAN) 1 MG tablet Take 0.5 mg by mouth at bedtime.    . meloxicam (MOBIC) 7.5 MG tablet Take 7.5 mg by mouth at bedtime.    . pantoprazole (PROTONIX) 40 MG tablet TAKE ONE TABLET BY MOUTH ONE TIME DAILY 30 tablet 11  . polyethylene glycol (MIRALAX / GLYCOLAX) packet Take 17 g by mouth 3 (three) times a week.     No current facility-administered medications for this visit.       Physical Exam: BP 149/102 mmHg  Pulse 76  Resp 20  Ht '5\' 4"'$  (1.626 m)  Wt 143 lb (64.864 kg)  BMI 24.53 kg/m2  SpO2  97%  General appearance: alert and cooperative Neurologic: intact Heart: regular rate and rhythm, S1, S2 normal, no murmur, click, rub or gallop and normal apical impulse Lungs: clear to auscultation bilaterally Wound: Her left chest incision is well-healed she has full range of motion of her left shoulder I do not appreciate any cervical supraclavicular or axillary adenopathy  Diagnostic Studies & Laboratory data:         Recent Radiology Findings: Ct Chest Wo Contrast  01/15/2015  CLINICAL DATA:  Subsequent treatment evaluation for lung carcinoma. Diagnosis 2012 with chemo radiation chemotherapy. Nonsmoker for 4 years. EXAM: CT CHEST WITHOUT CONTRAST TECHNIQUE: Multidetector CT imaging of the chest was performed following the standard protocol without IV contrast. COMPARISON:  CT chest 01/09/2014 FINDINGS: Mediastinum/Nodes: No axillary or supraclavicular lymphadenopathy. No mediastinal hilar adenopathy. No pericardial fluid. Esophagus is normal. Lungs/Pleura: Postoperative change in the LEFT hemi thorax consistent LEFT upper lobe resection. There is stable pleural thickening at the LEFT lung apex adjacent to the surgical clips. No pulmonary nodules. Centrilobular emphysema in the upper lobes. Stable pleural calcification of the LEFT hemidiaphragm Upper abdomen: Adrenal glands are normal. No upper abdominal adenopathy. Musculoskeletal: No aggressive  osseous lesion. First rib resection and sclerosis of the second rib presumably related to radiation. IMPRESSION: 1. Stable postsurgical and post therapy change in the LEFT hemi thorax. 2. No evidence of lung cancer recurrence. Electronically Signed   By: Suzy Bouchard M.D.   On: 01/15/2015 09:48      Recent Labs: Lab Results  Component Value Date   WBC 7.1 09/08/2013   HGB 12.9 09/08/2013   HCT 39.4 09/08/2013   PLT 329 09/08/2013   GLUCOSE 101* 09/08/2013   ALT 15 01/23/2012   AST 14 01/23/2012   NA 141 09/08/2013   K 4.0 09/08/2013    CL 102 09/08/2013   CREATININE 0.80 09/08/2013   BUN 22 09/08/2013   CO2 29 09/08/2013   TSH 2.05 12/13/2012   INR 0.93 01/23/2012      Assessment / Plan:     Patient is 4.5 years  months following  chest wall resection and left upper lobectomy for moderately differentiated adenocarcinoma/Pancoast tumor, 5 years from dx and starting treatment with chemo radiation  ypTX , pN0 By Physical exam and CT there is no evidence of recurrent disease. Patient remains smoke free since surgery , previous she has 34 pack year smoking history Now at 5 year post treatment, will refer to lung cancer screen ing for low dose scan next year   Grace Isaac MD 01/15/2015 11:00 AM

## 2015-01-16 ENCOUNTER — Encounter: Payer: Self-pay | Admitting: Cardiothoracic Surgery

## 2015-02-17 ENCOUNTER — Other Ambulatory Visit (HOSPITAL_COMMUNITY)
Admission: RE | Admit: 2015-02-17 | Discharge: 2015-02-17 | Disposition: A | Discharge status: Home / Self Care | Payer: BLUE CROSS/BLUE SHIELD | Source: Ambulatory Visit | Attending: Adult Health | Admitting: Adult Health

## 2015-02-17 ENCOUNTER — Ambulatory Visit (INDEPENDENT_AMBULATORY_CARE_PROVIDER_SITE_OTHER): Payer: BLUE CROSS/BLUE SHIELD | Admitting: Adult Health

## 2015-02-17 ENCOUNTER — Encounter: Payer: Self-pay | Admitting: Adult Health

## 2015-02-17 VITALS — BP 144/92 | HR 60 | Ht 63.5 in | Wt 145.0 lb

## 2015-02-17 DIAGNOSIS — Z1212 Encounter for screening for malignant neoplasm of rectum: Secondary | ICD-10-CM | POA: Diagnosis not present

## 2015-02-17 DIAGNOSIS — K5900 Constipation, unspecified: Secondary | ICD-10-CM

## 2015-02-17 DIAGNOSIS — Z01419 Encounter for gynecological examination (general) (routine) without abnormal findings: Secondary | ICD-10-CM

## 2015-02-17 DIAGNOSIS — Z1151 Encounter for screening for human papillomavirus (HPV): Secondary | ICD-10-CM | POA: Insufficient documentation

## 2015-02-17 DIAGNOSIS — N951 Menopausal and female climacteric states: Secondary | ICD-10-CM | POA: Insufficient documentation

## 2015-02-17 HISTORY — DX: Menopausal and female climacteric states: N95.1

## 2015-02-17 HISTORY — DX: Constipation, unspecified: K59.00

## 2015-02-17 LAB — HEMOCCULT GUIAC POC 1CARD (OFFICE): Fecal Occult Blood, POC: NEGATIVE

## 2015-02-17 NOTE — Patient Instructions (Addendum)
Physical in 1 year Pap in 3 if normal Mammogram yearly Colonoscopy per GI Try luvena and astroglide with sex

## 2015-02-17 NOTE — Progress Notes (Addendum)
Patient ID: [name], female   DOB: [birth year], 55 y.o.   MRN: [id number] History of Present Illness: [name] is a 55 year old white female, married in for new well woman gyn exam and pap.She declined the flu shot.She does complain of vaginal dryness and pain with sex at times.She has constipation too.She has fatigue and body aches.She had lung cancer in 2012, it was a pancoast tumor, which was removed followed by chemo and radiation.She saw Dr Servando Snare. PCP is [name] at [facility].   Current Medications, Allergies, Past Medical History, Past Surgical History, Family History and Social History were reviewed in [employer] record.     Review of Systems: Patient denies any headaches, hearing loss, blurred vision, shortness of breath, chest pain, abdominal pain, problems with  Urination. No joint pain or mood swings. See HPI for positives.   Physical Exam:BP 144/92 mmHg  Pulse 60  Ht 5' 3.5" (1.613 m)  Wt 145 lb (65.772 kg)  BMI 25.28 kg/m2 General:  Well developed, well nourished, no acute distress Skin:  Warm and dry Neck:  Midline trachea, normal thyroid, good ROM, no lymphadenopathy Lungs; Clear to auscultation bilaterally Breast:  No dominant palpable mass, retraction, or nipple discharge, has scar near clavicle from surgery. Cardiovascular: Regular rate and rhythm Abdomen:  Soft,no hepatosplenomegaly, has tenderness epigastric area Pelvic:  External genitalia is normal in appearance, no lesions.  The vagina has decreased color, moisture and rugae. Urethra has no lesions or masses. The cervix is smooth, pap with HPV performed.  Uterus is felt to be normal size, shape, and contour.  No adnexal masses or tenderness noted.Bladder is non tender, no masses felt. Rectal: Good sphincter tone, no polyps, small internal  hemorrhoids felt.  Hemoccult negative. Extremities/musculoskeletal:  No swelling or varicosities noted, no clubbing or  cyanosis Psych:  No mood changes, alert and cooperative,seems happy   Impression: Well woman gyn exam and pap Vaginal dryness Constipation  History of lung cancer    Plan: Try 1/2 cup, applesauce and 1/2 cup prune juice and 1/2 cup of real oatmeal daily Try luvena for vaginal moisture and astroglide with sex Check CBC,CMP,TSH and lipids,A1c and vitamin D Physical in 1 year, pap in 3 if normal Mammogram yearly Colonoscopy per GI, Dr Gala Romney

## 2015-02-18 LAB — COMPREHENSIVE METABOLIC PANEL
ALT: 58 IU/L — ABNORMAL HIGH (ref 0–32)
AST: 69 IU/L — ABNORMAL HIGH (ref 0–40)
Albumin/Globulin Ratio: 1.8 (ref 1.1–2.5)
Albumin: 4.3 g/dL (ref 3.5–5.5)
Alkaline Phosphatase: 155 IU/L — ABNORMAL HIGH (ref 39–117)
BUN / CREAT RATIO: 31 — AB (ref 9–23)
BUN: 25 mg/dL — AB (ref 6–24)
Bilirubin Total: 0.3 mg/dL (ref 0.0–1.2)
CHLORIDE: 100 mmol/L (ref 96–106)
CO2: 28 mmol/L (ref 18–29)
Calcium: 9.9 mg/dL (ref 8.7–10.2)
Creatinine, Ser: 0.8 mg/dL (ref 0.57–1.00)
GFR calc non Af Amer: 83 mL/min/{1.73_m2} (ref >59)
GFR, EST AFRICAN AMERICAN: 96 mL/min/{1.73_m2} (ref >59)
GLOBULIN, TOTAL: 2.4 g/dL (ref 1.5–4.5)
Glucose: 94 mg/dL (ref 65–99)
Potassium: 5.1 mmol/L (ref 3.5–5.2)
SODIUM: 142 mmol/L (ref 134–144)
TOTAL PROTEIN: 6.7 g/dL (ref 6.0–8.5)

## 2015-02-18 LAB — HEMOGLOBIN A1C
ESTIMATED AVERAGE GLUCOSE: 123 mg/dL
HEMOGLOBIN A1C: 5.9 % — AB (ref 4.8–5.6)

## 2015-02-18 LAB — LIPID PANEL
CHOL/HDL RATIO: 3.3 ratio (ref 0.0–4.4)
Cholesterol, Total: 203 mg/dL — ABNORMAL HIGH (ref 100–199)
HDL: 62 mg/dL (ref >39)
LDL Calculated: 91 mg/dL (ref 0–99)
Triglycerides: 252 mg/dL — ABNORMAL HIGH (ref 0–149)
VLDL Cholesterol Cal: 50 mg/dL — ABNORMAL HIGH (ref 5–40)

## 2015-02-18 LAB — CBC
Hematocrit: 38.4 % (ref 34.0–46.6)
Hemoglobin: 12.4 g/dL (ref 11.1–15.9)
MCH: 28.6 pg (ref 26.6–33.0)
MCHC: 32.3 g/dL (ref 31.5–35.7)
MCV: 89 fL (ref 79–97)
Platelets: 335 10*3/uL (ref 150–379)
RBC: 4.33 x10E6/uL (ref 3.77–5.28)
RDW: 13.3 % (ref 12.3–15.4)
WBC: 7.6 10*3/uL (ref 3.4–10.8)

## 2015-02-18 LAB — VITAMIN D 25 HYDROXY (VIT D DEFICIENCY, FRACTURES): Vit D, 25-Hydroxy: 22.4 ng/mL — ABNORMAL LOW (ref 30.0–100.0)

## 2015-02-18 LAB — TSH: TSH: 1.72 u[IU]/mL (ref 0.450–4.500)

## 2015-02-18 LAB — CYTOLOGY - PAP

## 2015-02-19 ENCOUNTER — Telehealth: Payer: Self-pay | Admitting: *Deleted

## 2015-02-19 ENCOUNTER — Other Ambulatory Visit: Payer: Self-pay | Admitting: *Deleted

## 2015-02-19 ENCOUNTER — Telehealth: Payer: Self-pay | Admitting: Advanced Practice Midwife

## 2015-02-19 ENCOUNTER — Telehealth: Payer: Self-pay | Admitting: Adult Health

## 2015-02-19 NOTE — Telephone Encounter (Signed)
Pt states spoke with Derrek Monaco, NP today in regards to elevated enzymes, pt states she was having her nails done and was unable to write recommendations down. Pt informed of Derrek Monaco, NP recommendation per lab note in Epic, Pt to repeat labs in 3 months. Pt verbalized understanding.

## 2015-02-19 NOTE — Telephone Encounter (Signed)
Pt aware pap normal, and about labs, decrease fats and carbs and increase activity, decrease tylenol and alcohol and take vitamin D3 5000 IU every day, recheck labs(lipids, CMP and A1c and vitamin D) in 3 months, can do at PCP

## 2015-02-19 NOTE — Telephone Encounter (Signed)
Left message to call about labs 

## 2015-02-19 NOTE — Telephone Encounter (Signed)
I think I routed this to [place], but not sure.  Could you route it to her, just to be sure?  (It wasn't me that she spoke with)

## 2015-02-25 ENCOUNTER — Telehealth: Payer: Self-pay | Admitting: Advanced Practice Midwife

## 2015-02-25 NOTE — Telephone Encounter (Signed)
Spoke with pt letting her know pap was normal. She was aware of her lab results and thought we had called about pap. [name]

## 2015-02-25 NOTE — Telephone Encounter (Signed)
Line busy @ 11:20am. JSY

## 2015-03-05 NOTE — Therapy (Signed)
[name] [place], [place], [address] Phone: [phone]   Fax:  [phone]  Physical Therapy Treatment  Patient Details  Name: LAURIEANNE GALLOWAY MRN: [id number] Date of Birth: 11/01/1960 No Data Recorded  Encounter Date: 11/04/2014    Past Medical History  Diagnosis Date  . Lung cancer ([place])     pancoast tumor  . Hypertension   . Anxiety   . Menopausal vaginal dryness 02/17/2015  . Constipation 02/17/2015  . Fibromyalgia     Past Surgical History  Procedure Laterality Date  . Thoracotomy  [id number]    transsmanubrial anterolaterl thoracotomy with chest wall resection includinf the first rib and left  upper lobectomy for Pancoast turmor  . Portacath placement  [id number]    Rt   IJ  Port-A-Cath  dr.burney  . Tubal ligation    . Sternal wires removal  01/23/2012    Procedure: STERNAL WIRES REMOVAL;  Surgeon: Grace Isaac, MD;  Location: [facility];  Service: Thoracic;  Laterality: N/A;  . Colonoscopy  12/2010    in [place], per patient, normal, next colonoscopy 10 years  . Laparoscopic appendectomy N/A 06/21/2013    Procedure: APPENDECTOMY LAPAROSCOPIC;  Surgeon: Jamesetta So, MD;  [facility];  Service: General;  Laterality: N/A;  . Foreign body removal N/A 09/08/2013    RMR:Mid esophageal stricture-likely radiation-induced/Schatzki's ring. Hiatal hernia  . Esophagogastroduodenoscopy N/A 10/16/2013    Procedure: ESOPHAGOGASTRODUODENOSCOPY (EGD);  Surgeon: Daneil Dolin, MD;  [facility];  Service: Endoscopy;  Laterality: N/A;  1230-rescheduled 9/23 @ [place] notified pt  . Maloney dilation N/A 10/16/2013    Procedure: Venia Minks DILATION;  Surgeon: Daneil Dolin, MD;  [facility];  Service: Endoscopy;  Laterality: N/A;    There were no vitals filed for this visit.  Visit Diagnosis:  Right low back pain, with sciatica presence unspecified  Decreased range of motion of lumbar  spine                                    PT Long Term Goals - 11/04/14 1710    PT LONG TERM GOAL #1   Title Ind with HEP.   Time 6   Period Weeks   Status On-going   PT LONG TERM GOAL #2   Title Eliminate LE symptoms.   Time 6   Period Weeks   Status On-going   PT LONG TERM GOAL #3   Title Sit 30 minutes with pain not > 3/10.   Time 6   Period Weeks   Status On-going   PT LONG TERM GOAL #4   Title Stand 20 minutes with pain not > 3/10.   Time 6   Period Weeks   Status On-going   PT LONG TERM GOAL #5   Title Sleep 6 hours undisturbed.   Time 6   Period Weeks   Status On-going   PT LONG TERM GOAL #6   Title Perform ADL's with pain not > 3/10.   Time 6   Period Weeks   Status On-going               Problem List Patient Active Problem List   Diagnosis Date Noted  . Menopausal vaginal dryness 02/17/2015  . Constipation 02/17/2015  . Esophageal dysphagia 09/25/2013  . Hiccups 04/19/2013  . Unspecified constipation 04/18/2013  . Anxiety   . Hypertension   .  Lung cancer ([place])    PHYSICAL THERAPY DISCHARGE SUMMARY  Visits from Start of Care: 5  Current functional level related to goals / functional outcomes: Please see above.   Remaining deficits: Continued low back pain.   Education / Equipment: HEP.  Plan: Patient agrees to discharge.  Patient goals were not met. Patient is being discharged due to not returning since the last visit.  ?????      Giovana Faciane, [place] MPT 03/05/2015, 2:52 PM  [facility] [address] [place], [place], [address] Phone: [phone]   Fax:  [phone]  Name: DEMIYA MAGNO MRN: [id number] Date of Birth: 01/08/1961

## 2015-03-19 ENCOUNTER — Ambulatory Visit (INDEPENDENT_AMBULATORY_CARE_PROVIDER_SITE_OTHER): Payer: BLUE CROSS/BLUE SHIELD | Admitting: Nurse Practitioner

## 2015-03-19 ENCOUNTER — Encounter: Payer: Self-pay | Admitting: Nurse Practitioner

## 2015-03-19 VITALS — BP 137/87 | HR 89 | Temp 97.6°F | Ht 63.0 in | Wt 143.6 lb

## 2015-03-19 DIAGNOSIS — R1310 Dysphagia, unspecified: Secondary | ICD-10-CM

## 2015-03-19 DIAGNOSIS — K219 Gastro-esophageal reflux disease without esophagitis: Secondary | ICD-10-CM | POA: Insufficient documentation

## 2015-03-19 DIAGNOSIS — R1314 Dysphagia, pharyngoesophageal phase: Secondary | ICD-10-CM | POA: Diagnosis not present

## 2015-03-19 DIAGNOSIS — R1319 Other dysphagia: Secondary | ICD-10-CM

## 2015-03-19 DIAGNOSIS — K5900 Constipation, unspecified: Secondary | ICD-10-CM | POA: Diagnosis not present

## 2015-03-19 MED ORDER — PANTOPRAZOLE SODIUM 40 MG PO TBEC: 40.0000 mg | DELAYED_RELEASE_TABLET | Freq: Two times a day (BID) | ORAL | Status: DC

## 2015-03-19 NOTE — Assessment & Plan Note (Signed)
Patient with burning epigastric pain after eating. Pain is not severe. Has a history of antral erosions on EGD 1 year ago. Occasionally takes ASA powders, no other NSAID use. Advised her to stop taking all aspirin powders and NSAIDs. We will increase Protonix to twice a day for now for symptomatic management. Depending on her response to increased Protonix and the results of her barium pill esophagram (as noted above) she may need a repeat endoscopy. Return for follow-up in 2 months.

## 2015-03-19 NOTE — Progress Notes (Signed)
cc'ed to pcp °

## 2015-03-19 NOTE — Assessment & Plan Note (Signed)
Patient with a history of chronic constipation which is become worse in the past 4 years. States she has not had an unassisted bowel movement (MiraLAX and/or enema) in the past 3 years. Last colonoscopy she thinks was 7-8 years ago [facility], we will request these records. No acute abdomen or red flag/warning signs or symptoms. At this point I'll start her on Linzess 145 g a day, call with progress report in 2 weeks. Return for follow-up in 2 months.

## 2015-03-19 NOTE — Progress Notes (Addendum)
Last colonoscopy report received from [facility]. The procedure was completed 12/22/2010 by Dr. Ena Dawley. Findings include normal colonoscopy from anal verge to ileocecal valve; no polyps, masses, AVMs, diverticula, or evidence of inflammation. No family history of colon cancer, recommended repeat screening in 10 years at age 55.

## 2015-03-19 NOTE — Patient Instructions (Signed)
1. Start taking Protonix twice a day. 2. We will schedule your x-ray to evaluate your swallowing. 3. Start taking Linzess 145 g once a day. I will give you samples to last couple weeks. Call with a progress report in 2 weeks. 4. Avoid all aspirin powders and NSAIDs (ibuprofen, Motrin, Advil, naproxen, Naprosyn, etc.) 5. Return for follow-up in 2 months.

## 2015-03-19 NOTE — Progress Notes (Signed)
Referring Provider: Terald Sleeper, PA-C Primary Care Physician:  Terald Sleeper, PA-C Primary GI:  Dr. Gala Romney  Chief Complaint  Patient presents with  . Dysphagia  . Constipation    HPI:   [name] is a 55 y.o. female who presents for follow-up on dysphagia. Last seen in our office 09/25/2013 for the same. 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 Incomplete exam due to food within the stomach at which point she was scheduled for a follow-up endoscopy. This is performed 10/16/2013 which found Schatzki's ring at the GE junction status post [facility] dilation, multiple 3 mm erosions in the gastric antrum with multiple biopsies performed, 2 cm hiatal hernia. Recommend continue current medications, swallowing precautions, follow-up office visit in 6 months which does not appear to have occurred. Stomach biopsy on pathology found gastric antral type mucosa with surface erosion without H. pylori.  Today she states she's "not having immense trouble swallowing and not getting choked, but starting to get hiccups when eating bread and meat which usually passes with a bunch of water. It's only happened twice in the past couple months." Also with constipation, takes Miralax prn daily and takes three days of Miralax to have a bowel movement and then will have lots of large bowel movements which makes it difficult to do much for about half a day. Sometimes requires enema. Has had constipation most of her life, but getting worse for the past 4 years. Has not had an "unassisted bowel movement in about 3 years." Last colonoscopy 7-8 years ago at [facility]. Has burning epigastric pain with eating. When she hasn't had a bowel movement hurts lower abdomen. Denies NSAID use. Occasional ASA powder, typically twice a month for severe headache. Denies hematochezia, melena. Occasional N/V  with severe constipation. Denies unintentional weight loss, fever, chills, acute changes in bowel habits. Denies chest pain, dyspnea, dizziness, lightheadedness, syncope, near syncope. Denies any other upper or lower GI symptoms.    Past Medical History  Diagnosis Date  . Lung cancer ([place])     pancoast tumor  . Hypertension   . Anxiety   . Menopausal vaginal dryness 02/17/2015  . Constipation 02/17/2015  . Fibromyalgia     Past Surgical History  Procedure Laterality Date  . Thoracotomy  [id number]    transsmanubrial anterolaterl thoracotomy with chest wall resection includinf the first rib and left  upper lobectomy for Pancoast turmor  . Portacath placement  [id number]    Rt   IJ  Port-A-Cath  dr.burney  . Tubal ligation    . Sternal wires removal  01/23/2012    Procedure: STERNAL WIRES REMOVAL;  Surgeon: Grace Isaac, MD;  Location: [place];  Service: Thoracic;  Laterality: N/A;  . Colonoscopy  12/2010    in [place], per patient, normal, next colonoscopy 10 years  . Laparoscopic appendectomy N/A 06/21/2013    Procedure: APPENDECTOMY LAPAROSCOPIC;  Surgeon: Jamesetta So, MD;  Location: [facility];  Service: General;  Laterality: N/A;  . Foreign body removal N/A 09/08/2013    RMR:Mid esophageal stricture-likely radiation-induced/Schatzki's ring. Hiatal hernia  . Esophagogastroduodenoscopy N/A 10/16/2013    JJH:ERDEYCXK ring s/p dilation/2 cm HH  . Maloney dilation N/A 10/16/2013    Procedure: Venia Minks DILATION;  Surgeon: Daneil Dolin, MD;  [facility];  Service: Endoscopy;  Laterality: N/A;    Current Outpatient Prescriptions  Medication Sig Dispense Refill  . ALPRAZolam (XANAX) 0.5 MG tablet Take 0.5 mg by mouth as needed for anxiety.    . baclofen (LIORESAL) 10 MG tablet Take 10 mg by mouth at bedtime as needed for muscle spasms (1/2 tablet at bedtime as needed).    . DULoxetine (CYMBALTA) 30 MG capsule Take 60 mg by mouth 2 (two) times daily.     Marland Kitchen estradiol  (ESTRACE) 0.1 MG/GM vaginal cream Place 1 Applicatorful vaginally at bedtime.    Marland Kitchen HYDROcodone-acetaminophen (NORCO) 10-325 MG per tablet Take 1 tablet by mouth 3 (three) times daily. Radiation pain    . lisinopril (PRINIVIL,ZESTRIL) 40 MG tablet Take 40 mg by mouth daily.     . meloxicam (MOBIC) 7.5 MG tablet Take 7.5 mg by mouth at bedtime.    . pantoprazole (PROTONIX) 40 MG tablet TAKE ONE TABLET BY MOUTH ONE TIME DAILY 30 tablet 11  . polyethylene glycol (MIRALAX / GLYCOLAX) packet Take 17 g by mouth 3 (three) times a week.    Marland Kitchen LORazepam (ATIVAN) 1 MG tablet Take 0.5 mg by mouth at bedtime. Reported on 03/19/2015     No current facility-administered medications for this visit.    Allergies as of 03/19/2015  . (No Known Allergies)    Family History  Problem Relation Age of Onset  . Colon cancer Neg Hx   . Cancer Mother     lung  . Hypertension Father   . Stroke Father   . Hypertension Sister   . Heart disease Sister   . Cancer Brother     pancreatic    Social History   Social History  . Marital Status: Married    Spouse Name: N/A  . Number of Children: N/A  . Years of Education: N/A   Social History Main Topics  . Smoking status: Former Smoker    Types: Cigarettes    Quit date: 02/04/2010  . Smokeless tobacco: Never Used  . Alcohol Use: No  . Drug Use: No  . Sexual Activity: Yes    Birth Control/ Protection: Post-menopausal   Other Topics Concern  . None   Social History Narrative    Review of Systems: General: Negative for anorexia, weight loss, fever, chills, fatigue, weakness. ENT: Negative for hoarseness. CV: Negative for chest pain, angina, palpitations, peripheral edema.  Respiratory: Negative for dyspnea at rest, cough, sputum, wheezing.  GI: See history of present illness. Endo: Negative for unusual weight change.  Heme: Negative for bruising or bleeding.   Physical Exam: BP 137/87 mmHg  Pulse 89  Temp(Src) 97.6 F (36.4 C) (Oral)  Ht 5'  3" (1.6 m)  Wt 143 lb 9.6 oz (65.137 kg)  BMI 25.44 kg/m2 General:   Alert and oriented. Pleasant and cooperative. Well-nourished and well-developed.  Head:  Normocephalic and atraumatic. Eyes:  Without icterus, sclera clear and conjunctiva pink.  Cardiovascular:  S1, S2 present without murmurs appreciated. Extremities without clubbing or edema. Respiratory:  Clear to auscultation bilaterally. No wheezes, rales, or rhonchi. No distress.  Gastrointestinal:  +BS, soft, and non-distended. Minimal TTP lower abdomen, mild TTP epigastric area. No HSM noted. No guarding or rebound. No masses appreciated.  Rectal:  Deferred  Musculoskalatal:  Symmetrical without gross deformities. Skin:  Intact without significant lesions or rashes. Neurologic:  Alert and oriented x4;  grossly normal neurologically. Psych:  Alert and cooperative. Normal mood and affect.    03/19/2015 9:07 AM

## 2015-03-19 NOTE — Assessment & Plan Note (Signed)
Patient with a history of esophageal dysphagia, Schatzki's ring status post Maloney dilation 1 year ago on EGD. States it feels like her symptoms are starting to come back. Being that she just had an endoscopy one year ago, I will order a barium pill esophagram to evaluate for stricture. Swallowing precautions reinforced. We'll increase her Protonix at this time as well especially given her worsening GERD symptoms as noted below. Return for follow-up in 2 months.

## 2015-03-25 ENCOUNTER — Other Ambulatory Visit (HOSPITAL_COMMUNITY): Payer: BLUE CROSS/BLUE SHIELD

## 2015-04-07 ENCOUNTER — Telehealth: Payer: Self-pay | Admitting: Internal Medicine

## 2015-04-07 DIAGNOSIS — K5900 Constipation, unspecified: Secondary | ICD-10-CM

## 2015-04-07 NOTE — Telephone Encounter (Signed)
We have not received a refill request for that. Routing to the refill box.

## 2015-04-07 NOTE — Telephone Encounter (Signed)
Pt called to say her pharmacy [facility]) has not received her prescription of Linzess and we needed to call that in because she is out. [phone]

## 2015-04-08 MED ORDER — LINACLOTIDE 290 MCG PO CAPS: 290.0000 ug | ORAL_CAPSULE | Freq: Every day | ORAL | Status: DC

## 2015-04-08 NOTE — Telephone Encounter (Signed)
Tried to call pt- NA-LMOM that rx has been sent in to the pharmacy.

## 2015-04-08 NOTE — Addendum Note (Signed)
Addended by: Gordy Levan, Hermione Havlicek A on: 04/08/2015 08:31 AM   Modules accepted: Orders

## 2015-04-08 NOTE — Telephone Encounter (Signed)
Please notify patient that Rx has been sent to the pharmacy

## 2015-04-23 ENCOUNTER — Ambulatory Visit (HOSPITAL_COMMUNITY)
Admission: RE | Admit: 2015-04-23 | Discharge: 2015-04-23 | Disposition: A | Discharge status: Home / Self Care | Payer: BLUE CROSS/BLUE SHIELD | Source: Ambulatory Visit | Attending: Nurse Practitioner | Admitting: Nurse Practitioner

## 2015-04-23 DIAGNOSIS — K449 Diaphragmatic hernia without obstruction or gangrene: Secondary | ICD-10-CM | POA: Diagnosis not present

## 2015-04-23 DIAGNOSIS — R1310 Dysphagia, unspecified: Secondary | ICD-10-CM

## 2015-04-23 DIAGNOSIS — R1314 Dysphagia, pharyngoesophageal phase: Secondary | ICD-10-CM | POA: Insufficient documentation

## 2015-04-23 DIAGNOSIS — R1319 Other dysphagia: Secondary | ICD-10-CM

## 2015-05-14 ENCOUNTER — Encounter: Payer: Self-pay | Admitting: Nurse Practitioner

## 2015-05-14 ENCOUNTER — Ambulatory Visit (INDEPENDENT_AMBULATORY_CARE_PROVIDER_SITE_OTHER): Payer: BLUE CROSS/BLUE SHIELD | Admitting: Nurse Practitioner

## 2015-05-14 VITALS — BP 178/95 | HR 72 | Temp 98.5°F | Ht 64.0 in | Wt 142.0 lb

## 2015-05-14 DIAGNOSIS — K5900 Constipation, unspecified: Secondary | ICD-10-CM

## 2015-05-14 DIAGNOSIS — K219 Gastro-esophageal reflux disease without esophagitis: Secondary | ICD-10-CM

## 2015-05-14 DIAGNOSIS — R1310 Dysphagia, unspecified: Secondary | ICD-10-CM

## 2015-05-14 DIAGNOSIS — R1314 Dysphagia, pharyngoesophageal phase: Secondary | ICD-10-CM | POA: Diagnosis not present

## 2015-05-14 DIAGNOSIS — R1319 Other dysphagia: Secondary | ICD-10-CM

## 2015-05-14 LAB — COMPREHENSIVE METABOLIC PANEL
ALBUMIN: 4.4 g/dL (ref 3.6–5.1)
ALK PHOS: 120 U/L (ref 33–130)
ALT: 22 U/L (ref 6–29)
AST: 18 U/L (ref 10–35)
BILIRUBIN TOTAL: 0.5 mg/dL (ref 0.2–1.2)
BUN: 25 mg/dL (ref 7–25)
CALCIUM: 10 mg/dL (ref 8.6–10.4)
CO2: 26 mmol/L (ref 20–31)
CREATININE: 0.72 mg/dL (ref 0.50–1.05)
Chloride: 102 mmol/L (ref 98–110)
Glucose, Bld: 92 mg/dL (ref 65–99)
Potassium: 4.7 mmol/L (ref 3.5–5.3)
SODIUM: 140 mmol/L (ref 135–146)
Total Protein: 7.5 g/dL (ref 6.1–8.1)

## 2015-05-14 LAB — LIPID PANEL
Cholesterol: 231 mg/dL — ABNORMAL HIGH (ref 125–200)
HDL: 96 mg/dL (ref >=46)
LDL Cholesterol: 118 mg/dL (ref <130)
TRIGLYCERIDES: 86 mg/dL (ref <150)
Total CHOL/HDL Ratio: 2.4 Ratio (ref <=5.0)
VLDL: 17 mg/dL (ref <30)

## 2015-05-14 NOTE — Assessment & Plan Note (Signed)
She continues to struggle with constipation, Linzess 290 g daily worked well initially but is not working as well. Her stools are softer, however she still has difficulty having a bowel movement. This was worsened by the barium from her barium pill esophagram. She is on medications that can cause constipation. At this point, given opioid pain medication, we'll switch her to Amitiza 24 g twice daily. We'll provide samples for 2 weeks with requesting her to call with a progress report in 2 weeks. If it works well we can send in a prescription. If not, we can tinker with her meds over the phone before her follow-up in 3 months.

## 2015-05-14 NOTE — Assessment & Plan Note (Signed)
Much improved. Barium pill esophagram shows presbyesophagus. Following dysphagia advanced diet. Continue to monitor, follow-up in 3 months.

## 2015-05-14 NOTE — Progress Notes (Signed)
Referring Provider: Terald Sleeper, PA-C Primary Care Physician:  Terald Sleeper, PA-C Primary GI:  Dr. Gala Romney  Chief Complaint  Patient presents with  . Follow-up    still has some dysphagia/ constipation, linzess not helping    HPI:   [name] is a 55 y.o. female who presents for follow-up on dysphagia. She was last seen in our office 03/19/2015 for constipation, dysphagia, GERD. At that time it was noted she has a history of esophageal dysphagia with Schatzki's ring status post dilation 1 year prior to like symptoms are coming back. Also noted history of chronic constipation worse in the past 4 years and required MiraLAX and enema consistently over the past 3 years. Tonics was increased at that time and a barium pill esophagram was ordered. Barium pill esophagram completed 04/23/2015 which found small sliding hiatal hernia with a mild B ring, no evidence of obstruction or reflux, barium tablet passed normally. Mild tertiary esophageal contractions suggestive presbyesophagus.  Today she states her dysphagia is improved. GERD and epigastric burning improved as well. Barium made her more constipated with significant symptoms. Linzess isn't working optimally, stools aren't as hard but has difficulty going. Initially it worked well, but decreased effectiveness after a couple weeks. Enemas typically help. Not trying rescue medications at this time. Is having abdominal pain which improves with a bowel movement. Has a bowel movement about once every 5-7 days. Denies hematochezia, melena, fever, chills, unintentional weight loss. Denies chest pain, dyspnea, dizziness, lightheadedness, syncope, near syncope. Denies any other upper or lower GI symptoms.    Past Medical History  Diagnosis Date  . Lung cancer ([place])     pancoast tumor  . Hypertension   . Anxiety   . Menopausal vaginal dryness 02/17/2015  . Constipation 02/17/2015  . Fibromyalgia     Past Surgical History  Procedure  Laterality Date  . Thoracotomy  [id number]    transsmanubrial anterolaterl thoracotomy with chest wall resection includinf the first rib and left  upper lobectomy for Pancoast turmor  . Portacath placement  [id number]    Rt   IJ  Port-A-Cath  dr.burney  . Tubal ligation    . Sternal wires removal  01/23/2012    Procedure: STERNAL WIRES REMOVAL;  Surgeon: Grace Isaac, MD;  Location: [facility];  Service: Thoracic;  Laterality: N/A;  . Colonoscopy  12/2010    in [place], per patient, normal, next colonoscopy 10 years  . Laparoscopic appendectomy N/A 06/21/2013    Procedure: APPENDECTOMY LAPAROSCOPIC;  Surgeon: Jamesetta So, MD;  Location: [facility];  Service: General;  Laterality: N/A;  . Foreign body removal N/A 09/08/2013    RMR:Mid esophageal stricture-likely radiation-induced/Schatzki's ring. Hiatal hernia  . Esophagogastroduodenoscopy N/A 10/16/2013    [facility]:ALPFXTKW ring s/p dilation/2 cm HH  . Maloney dilation N/A 10/16/2013    Procedure: Venia Minks DILATION;  Surgeon: Daneil Dolin, MD;  [facility];  Service: Endoscopy;  Laterality: N/A;    Current Outpatient Prescriptions  Medication Sig Dispense Refill  . ALPRAZolam (XANAX) 0.5 MG tablet Take 0.5 mg by mouth as needed for anxiety.    . baclofen (LIORESAL) 10 MG tablet Take 10 mg by mouth at bedtime as needed for muscle spasms (1/2 tablet at bedtime as needed).    . DULoxetine (CYMBALTA) 30 MG capsule Take 60 mg by mouth 2 (two) times daily.     Marland Kitchen HYDROcodone-acetaminophen (NORCO) 10-325 MG per tablet Take 1 tablet by mouth 3 (three) times daily.  Radiation pain    . lisinopril (PRINIVIL,ZESTRIL) 40 MG tablet Take 40 mg by mouth daily.     . meloxicam (MOBIC) 7.5 MG tablet Take 7.5 mg by mouth at bedtime.    . pantoprazole (PROTONIX) 40 MG tablet Take 1 tablet (40 mg total) by mouth 2 (two) times daily before a meal. 60 tablet 2  . Linaclotide (LINZESS) 290 MCG CAPS capsule Take 1 capsule (290 mcg total) by mouth daily.  (Patient not taking: Reported on 05/14/2015) 30 capsule 3   No current facility-administered medications for this visit.    Allergies as of 05/14/2015  . (No Known Allergies)    Family History  Problem Relation Age of Onset  . Colon cancer Neg Hx   . Cancer Mother     lung  . Hypertension Father   . Stroke Father   . Hypertension Sister   . Heart disease Sister   . Cancer Brother     pancreatic    Social History   Social History  . Marital Status: Married    Spouse Name: N/A  . Number of Children: N/A  . Years of Education: N/A   Social History Main Topics  . Smoking status: Former Smoker    Types: Cigarettes    Quit date: 02/04/2010  . Smokeless tobacco: Never Used     Comment: Quit x 5 years  . Alcohol Use: No  . Drug Use: No  . Sexual Activity: Yes    Birth Control/ Protection: Post-menopausal   Other Topics Concern  . None   Social History Narrative    Review of Systems: General: Negative for anorexia, weight loss, fever, chills, fatigue, weakness. ENT: Negative for hoarseness, difficulty swallowing. CV: Negative for chest pain, angina, palpitations, peripheral edema.  Respiratory: Negative for dyspnea at rest, cough, sputum, wheezing.  GI: See history of present illness. Endo: Negative for unusual weight change.    Physical Exam: BP 178/95 mmHg  Pulse 72  Temp(Src) 98.5 F (36.9 C) (Oral)  Ht '5\' 4"'$  (1.626 m)  Wt 142 lb (64.411 kg)  BMI 24.36 kg/m2 General:   Alert and oriented. Pleasant and cooperative. Well-nourished and well-developed.  Head:  Normocephalic and atraumatic. Eyes:  Without icterus, sclera clear and conjunctiva pink.  Ears:  Normal auditory acuity. Cardiovascular:  S1, S2 present without murmurs appreciated. Extremities without clubbing or edema. Respiratory:  Clear to auscultation bilaterally. No wheezes, rales, or rhonchi. No distress.  Gastrointestinal:  +BS, soft, and non-distended. Mild generalized TTP noted. No guarding  or rebound. Rectal:  Deferred  Musculoskalatal:  Symmetrical without gross deformities. Neurologic:  Alert and oriented x4;  grossly normal neurologically. Psych:  Alert and cooperative. Normal mood and affect. Heme/Lymph/Immune: No excessive bruising noted.    05/14/2015 9:15 AM   Disclaimer: This note was dictated with voice recognition software. Similar sounding words can inadvertently be transcribed and may not be corrected upon review.

## 2015-05-14 NOTE — Assessment & Plan Note (Signed)
Symptoms much improved, essentially resolved on increased dose of Protonix. Continue Protonix and monitor symptoms. Return for follow-up in 3 months.

## 2015-05-14 NOTE — Patient Instructions (Addendum)
1. Continue taking your acid blocker (Protonix). 2. Stop taking Linzess. 3. Start taking Amitiza 24 g twice a day with food. 4. Call us in 2 weeks and let us know if it is working. 5. Return for follow-up in 3 months.

## 2015-05-14 NOTE — Progress Notes (Signed)
CC'ED TO PCP 

## 2015-05-20 ENCOUNTER — Telehealth: Payer: Self-pay | Admitting: Internal Medicine

## 2015-05-20 NOTE — Telephone Encounter (Signed)
Patient's daughter (endoscopy tech) says patient's having recurrent bouts of esophageal dysphagia to meat and transient food impactions. Will likely need a repeat EGD with ED regardless of recent BPE findings.

## 2015-05-21 ENCOUNTER — Telehealth: Payer: Self-pay

## 2015-05-21 NOTE — Telephone Encounter (Signed)
Spoke with the pt about her blood work results. She wanted me to let EG know that the Baker Pierini was working great and she hasnt noticed any side effects at all from the medication.

## 2015-05-21 NOTE — Telephone Encounter (Signed)
Great!  We can refill as needed.

## 2015-06-08 ENCOUNTER — Telehealth: Payer: Self-pay | Admitting: Internal Medicine

## 2015-06-08 DIAGNOSIS — T402X5A Adverse effect of other opioids, initial encounter: Principal | ICD-10-CM

## 2015-06-08 DIAGNOSIS — K5903 Drug induced constipation: Secondary | ICD-10-CM

## 2015-06-08 MED ORDER — LUBIPROSTONE 24 MCG PO CAPS: 24.0000 ug | ORAL_CAPSULE | Freq: Two times a day (BID) | ORAL | Status: DC

## 2015-06-08 NOTE — Telephone Encounter (Signed)
Pt called to say that her amitiza prescription hasn't been sent to her pharmacy [facility]). She said that she was given samples and a coupon and the medicine worked well. She is just waiting for the prescription to be called in. [phone]

## 2015-06-08 NOTE — Telephone Encounter (Signed)
Open in error

## 2015-06-08 NOTE — Telephone Encounter (Signed)
Routing to the refill box. 

## 2015-06-08 NOTE — Telephone Encounter (Signed)
Working on PA

## 2015-06-08 NOTE — Telephone Encounter (Signed)
Please notify patient the refill was sent to the pharmacy

## 2015-07-23 ENCOUNTER — Other Ambulatory Visit: Payer: Self-pay | Admitting: Nurse Practitioner

## 2015-08-13 ENCOUNTER — Ambulatory Visit: Payer: BLUE CROSS/BLUE SHIELD | Admitting: Nurse Practitioner

## 2015-08-31 ENCOUNTER — Other Ambulatory Visit: Payer: Self-pay | Admitting: Nurse Practitioner

## 2015-08-31 DIAGNOSIS — T402X5A Adverse effect of other opioids, initial encounter: Principal | ICD-10-CM

## 2015-08-31 DIAGNOSIS — K5903 Drug induced constipation: Secondary | ICD-10-CM

## 2015-09-16 ENCOUNTER — Ambulatory Visit: Payer: BLUE CROSS/BLUE SHIELD | Admitting: Nurse Practitioner

## 2015-09-16 ENCOUNTER — Encounter: Payer: Self-pay | Admitting: Nurse Practitioner

## 2015-09-16 ENCOUNTER — Telehealth: Payer: Self-pay | Admitting: Nurse Practitioner

## 2015-09-16 NOTE — Telephone Encounter (Signed)
Noted  

## 2015-09-16 NOTE — Telephone Encounter (Signed)
PT WAS A NO SHOW AND LETTER SENT  °

## 2015-09-23 ENCOUNTER — Ambulatory Visit (INDEPENDENT_AMBULATORY_CARE_PROVIDER_SITE_OTHER): Payer: BLUE CROSS/BLUE SHIELD | Admitting: Physician Assistant

## 2015-09-23 ENCOUNTER — Encounter: Payer: Self-pay | Admitting: Physician Assistant

## 2015-09-23 VITALS — BP 128/83 | HR 81 | Temp 97.5°F | Ht 64.0 in | Wt 148.2 lb

## 2015-09-23 DIAGNOSIS — K219 Gastro-esophageal reflux disease without esophagitis: Secondary | ICD-10-CM

## 2015-09-23 DIAGNOSIS — C3412 Malignant neoplasm of upper lobe, left bronchus or lung: Secondary | ICD-10-CM

## 2015-09-23 DIAGNOSIS — I10 Essential (primary) hypertension: Secondary | ICD-10-CM | POA: Diagnosis not present

## 2015-09-23 DIAGNOSIS — F419 Anxiety disorder, unspecified: Secondary | ICD-10-CM

## 2015-09-23 DIAGNOSIS — M51379 Other intervertebral disc degeneration, lumbosacral region without mention of lumbar back pain or lower extremity pain: Secondary | ICD-10-CM | POA: Insufficient documentation

## 2015-09-23 DIAGNOSIS — M5137 Other intervertebral disc degeneration, lumbosacral region: Secondary | ICD-10-CM | POA: Insufficient documentation

## 2015-09-23 DIAGNOSIS — J209 Acute bronchitis, unspecified: Secondary | ICD-10-CM

## 2015-09-23 DIAGNOSIS — M5010 Cervical disc disorder with radiculopathy, unspecified cervical region: Secondary | ICD-10-CM

## 2015-09-23 MED ORDER — METHYLPREDNISOLONE ACETATE 80 MG/ML IJ SUSP
80.0000 mg | Freq: Once | INTRAMUSCULAR | Status: AC
  Administered 2015-09-23: 80 mg via INTRAMUSCULAR

## 2015-09-23 MED ORDER — HYDROCODONE-HOMATROPINE 5-1.5 MG/5ML PO SYRP: 10.0000 mL | ORAL_SOLUTION | Freq: Four times a day (QID) | ORAL | 0 refills | Status: DC | PRN

## 2015-09-23 MED ORDER — CLARITHROMYCIN 500 MG PO TABS: 500.0000 mg | ORAL_TABLET | Freq: Two times a day (BID) | ORAL | 0 refills | Status: DC

## 2015-09-23 MED ORDER — ALBUTEROL SULFATE HFA 108 (90 BASE) MCG/ACT IN AERS
2.0000 | INHALATION_SPRAY | Freq: Four times a day (QID) | RESPIRATORY_TRACT | 1 refills | Status: DC | PRN
Start: 2015-09-23 — End: 2018-05-07

## 2015-09-23 NOTE — Patient Instructions (Signed)

## 2015-09-23 NOTE — Addendum Note (Signed)
Addended by: Thana Ates on: 09/23/2015 10:50 AM   Modules accepted: Orders

## 2015-09-23 NOTE — Progress Notes (Signed)
BP 128/83   Pulse 81   Temp 97.5 F (36.4 C) (Oral)   Ht '5\' 4"'$  (1.626 m)   Wt 148 lb 3.2 oz (67.2 kg)   SpO2 97%   BMI 25.44 kg/m    Subjective:    Patient ID: [name], female    DOB: [birth year], 55 y.o.   MRN: [id number]  [name] is a 55 y.o. female presenting on 09/23/2015 for Cough (coughing up a greenish mucous ); Shortness of Breath; and Laryngitis   HPI Patient here to be established as new patient at [facility].  This patient is known to me from [facility]. All conditions and medications are reviewed and refilled if needed. History of pancoast tumor left apex, remission, HTN, GERD, anxiety and menopausal symptoms. Also see Dr Carloyn Manner for DDD of cervical and lumbar spines.  Has tried injections and meds, awaiting possible surgery. Main complaint today is illness of bronchitis nature. Has been sick for the past week with copious cough, productive with grene-brown sputum.  Denies severe fever or chills. No NVD. Cough harsh at times and experiencing SOB with activity.   Relevant past medical, surgical, family and social history reviewed and updated as indicated. Interim medical history since our last visit reviewed. Allergies and medications reviewed and updated.   Data reviewed from any sources in EPIC.  Review of Systems  Constitutional: Positive for activity change and fatigue. Negative for fever.  HENT: Positive for congestion, sore throat and voice change.   Eyes: Negative.   Respiratory: Positive for cough, shortness of breath and wheezing.   Cardiovascular: Negative.  Negative for chest pain.  Gastrointestinal: Negative.  Negative for abdominal pain.  Endocrine: Negative.   Genitourinary: Negative.  Negative for dysuria.  Musculoskeletal: Negative.   Skin: Negative.   Neurological: Negative.     Per HPI unless specifically indicated above  Social History   Social History  . Marital status: Married    Spouse  name: N/A  . Number of children: N/A  . Years of education: N/A   Occupational History  . Not on file.   Social History Main Topics  . Smoking status: Former Smoker    Types: Cigarettes    Quit date: 02/04/2010  . Smokeless tobacco: Never Used     Comment: Quit x 5 years  . Alcohol use No  . Drug use: No  . Sexual activity: Yes    Birth control/ protection: Post-menopausal   Other Topics Concern  . Not on file   Social History Narrative  . No narrative on file    Past Surgical History:  Procedure Laterality Date  . COLONOSCOPY  12/2010   in [place], per patient, normal, next colonoscopy 10 years  . ESOPHAGOGASTRODUODENOSCOPY N/A 10/16/2013   PFX:[id number] ring s/p dilation/2 cm HH  . 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 Hiatal hernia  . LAPAROSCOPIC APPENDECTOMY N/A 06/21/2013   Procedure: APPENDECTOMY LAPAROSCOPIC;  Surgeon: Jamesetta So, MD;  Location: [facility];  Service: General;  Laterality: N/A;  Venia Minks DILATION N/A 10/16/2013   Procedure: Keturah Shavers;  Surgeon: Daneil Dolin, MD;  Location: [facility];  Service: Endoscopy;  Laterality: N/A;  . PORTACATH PLACEMENT  [id number]   Rt   IJ  Port-A-Cath  [email or web address]  . STERNAL WIRES REMOVAL  01/23/2012   Procedure: STERNAL WIRES REMOVAL;  Surgeon: Grace Isaac, MD;  Location: [facility];  Service: Thoracic;  Laterality:  N/A;  . THORACOTOMY  [id number]   transsmanubrial anterolaterl thoracotomy with chest wall resection includinf the first rib and left  upper lobectomy for Pancoast turmor  . TUBAL LIGATION      Family History  Problem Relation Age of Onset  . Cancer Mother     lung  . Hypertension Father   . Stroke Father   . Hypertension Sister   . Heart disease Sister   . Cancer Brother     pancreatic  . Colon cancer Neg Hx       Medication List       Accurate as of 09/23/15  9:16 AM. Always use your most recent med list.            ALPRAZolam 0.5 MG tablet Commonly known as:  XANAX Take 0.5 mg by mouth as needed for anxiety.   AMITIZA 24 MCG capsule Generic drug:  lubiprostone TAKE  (1)  CAPSULE  TWICE DAILY WITH MEALS.   baclofen 10 MG tablet Commonly known as:  LIORESAL Take 10 mg by mouth at bedtime as needed for muscle spasms (1/2 tablet at bedtime as needed).   diclofenac 75 MG EC tablet Commonly known as:  VOLTAREN   DULoxetine 60 MG capsule Commonly known as:  CYMBALTA Take 60 mg by mouth daily.   HYDROcodone-acetaminophen 10-325 MG tablet Commonly known as:  NORCO Take 1 tablet by mouth 3 (three) times daily. Radiation pain   lisinopril 40 MG tablet Commonly known as:  PRINIVIL,ZESTRIL Take 40 mg by mouth daily.   LORazepam 1 MG tablet Commonly known as:  ATIVAN Take 1 mg by mouth at bedtime.   pantoprazole 40 MG tablet Commonly known as:  PROTONIX Take 1 tablet (40 mg total) by mouth 2 (two) times daily before a meal.          Objective:    BP 128/83   Pulse 81   Temp 97.5 F (36.4 C) (Oral)   Ht '5\' 4"'$  (1.626 m)   Wt 148 lb 3.2 oz (67.2 kg)   SpO2 97%   BMI 25.44 kg/m   No Known Allergies Wt Readings from Last 3 Encounters:  09/23/15 148 lb 3.2 oz (67.2 kg)  05/14/15 142 lb (64.4 kg)  03/19/15 143 lb 9.6 oz (65.1 kg)    Physical Exam  Constitutional: She is oriented to person, place, and time. She appears well-developed and well-nourished.  HENT:  Head: Normocephalic and atraumatic.  Right Ear: There is drainage and tenderness.  Left Ear: There is drainage and tenderness.  Nose: Mucosal edema present. No rhinorrhea. Right sinus exhibits no maxillary sinus tenderness and no frontal sinus tenderness. Left sinus exhibits no maxillary sinus tenderness and no frontal sinus tenderness.  Mouth/Throat: Oropharyngeal exudate and posterior oropharyngeal erythema present.  Eyes: Conjunctivae and EOM are normal. Pupils are equal, round, and reactive to light.  Neck: Normal  range of motion. Neck supple.  Cardiovascular: Normal rate, regular rhythm, normal heart sounds and intact distal pulses.   Pulmonary/Chest: Effort normal. No accessory muscle usage. No tachypnea. No respiratory distress. She has wheezes in the right upper field and the left upper field.  Abdominal: Soft. Bowel sounds are normal.  Neurological: She is alert and oriented to person, place, and time. She has normal reflexes.  Skin: Skin is warm and dry. No rash noted.  Psychiatric: She has a normal mood and affect. Her behavior is normal. Judgment and thought content normal.        Assessment & Plan:  1. Malignant neoplasm of upper lobe of left lung [facility]) Follow oncology as needed  2. Essential hypertension Low salt diet  3. Gastroesophageal reflux disease, esophagitis presence not specified Avoid triggers  4. Anxiety Stress reduction  5. Pancoast tumor, left [facility]) Follow oncology  6. Degeneration of lumbar or lumbosacral intervertebral disc Follow Dr. Carloyn Manner  7. Cervical disc disorder with radiculopathy of cervical region See above  8. Acute bronchitis, unspecified organism - albuterol (PROVENTIL HFA;VENTOLIN HFA) 108 (90 Base) MCG/ACT inhaler; Inhale 2 puffs into the lungs every 6 (six) hours as needed for wheezing or shortness of breath.  Dispense: 1 Inhaler; Refill: 1 - clarithromycin (BIAXIN) 500 MG tablet; Take 1 tablet (500 mg total) by mouth 2 (two) times daily.  Dispense: 20 tablet; Refill: 0 - HYDROcodone-homatropine (HYCODAN) 5-1.5 MG/5ML syrup; Take 10 mLs by mouth every 6 (six) hours as needed for cough.  Dispense: 240 mL; Refill: 0    Continue all other maintenance medications as listed above.  Follow up plan: 3 months for recheck on medications and conditions.  Terald Sleeper PA-C [place] [address]  [place],  [address] [phone]   09/23/2015, 9:16 AM

## 2015-10-05 ENCOUNTER — Other Ambulatory Visit: Payer: Self-pay | Admitting: *Deleted

## 2015-10-05 MED ORDER — LORAZEPAM 1 MG PO TABS: 1.0000 mg | ORAL_TABLET | Freq: Every day | ORAL | 1 refills | Status: DC

## 2015-10-05 NOTE — Telephone Encounter (Signed)
rx called into pharmacy

## 2015-10-16 ENCOUNTER — Encounter: Payer: Self-pay | Admitting: Physician Assistant

## 2015-10-16 ENCOUNTER — Ambulatory Visit (INDEPENDENT_AMBULATORY_CARE_PROVIDER_SITE_OTHER): Payer: BLUE CROSS/BLUE SHIELD | Admitting: Physician Assistant

## 2015-10-16 VITALS — BP 129/86 | HR 73 | Temp 98.1°F | Ht 64.0 in | Wt 143.0 lb

## 2015-10-16 DIAGNOSIS — N941 Unspecified dyspareunia: Secondary | ICD-10-CM

## 2015-10-16 DIAGNOSIS — B3731 Acute candidiasis of vulva and vagina: Secondary | ICD-10-CM

## 2015-10-16 DIAGNOSIS — N952 Postmenopausal atrophic vaginitis: Secondary | ICD-10-CM | POA: Diagnosis not present

## 2015-10-16 DIAGNOSIS — B373 Candidiasis of vulva and vagina: Secondary | ICD-10-CM

## 2015-10-16 DIAGNOSIS — L298 Other pruritus: Secondary | ICD-10-CM | POA: Diagnosis not present

## 2015-10-16 DIAGNOSIS — N898 Other specified noninflammatory disorders of vagina: Secondary | ICD-10-CM

## 2015-10-16 LAB — WET PREP FOR TRICH, YEAST, CLUE
CLUE CELL EXAM: NEGATIVE
TRICHOMONAS EXAM: NEGATIVE
YEAST EXAM: POSITIVE — AB

## 2015-10-16 MED ORDER — ESTROGENS, CONJUGATED 0.625 MG/GM VA CREA
1.0000 | TOPICAL_CREAM | Freq: Every day | VAGINAL | 12 refills | Status: DC
Start: 2015-10-16 — End: 2017-01-03

## 2015-10-16 MED ORDER — FLUCONAZOLE 150 MG PO TABS: 150.0000 mg | ORAL_TABLET | Freq: Once | ORAL | 0 refills | Status: AC

## 2015-10-16 NOTE — Progress Notes (Signed)
BP 129/86   Pulse 73   Temp 98.1 F (36.7 C) (Oral)   Ht '5\' 4"'$  (1.626 m)   Wt 143 lb (64.9 kg)   BMI 24.55 kg/m    Subjective:    Patient ID: [name], female    DOB: 1960/06/08, 55 y.o.   MRN: [id number]  HPI: [name] is a 55 y.o. female presenting on 10/16/2015 for No chief complaint on file.  She is here for severe vaginal pain, burning and pain with intercourse. She has known atrophic vaginitis and insurance has not been able to get her estrogen cream for the past years because insurance was not covering it.  Would like to readdress The need for estrogen therapy. She is currently having significant amount of pain and discharge and pain with urination. She feels that she has a yeast infection. She recently taken oral antibiotic and she had had a trip to the beach where she was in water much more.  Relevant past medical, surgical, family and social history reviewed and updated as indicated. Interim medical history since our last visit reviewed. Allergies and medications reviewed and updated. DATA REVIEWED: CHART IN EPIC  Social History   Social History  . Marital status: Married    Spouse name: N/A  . Number of children: N/A  . Years of education: N/A   Occupational History  . Not on file.   Social History Main Topics  . Smoking status: Former Smoker    Types: Cigarettes    Quit date: 02/04/2010  . Smokeless tobacco: Never Used     Comment: Quit x 5 years  . Alcohol use No  . Drug use: No  . Sexual activity: Yes    Birth control/ protection: Post-menopausal   Other Topics Concern  . Not on file   Social History Narrative  . No narrative on file      Family History  Problem Relation Age of Onset  . Cancer Mother     lung  . Hypertension Father   . Stroke Father   . Hypertension Sister   . Heart disease Sister   . Cancer Brother     pancreatic  . Colon cancer Neg Hx     Review of Systems  Constitutional: Negative.   HENT:  Negative.   Eyes: Negative.   Respiratory: Negative.   Gastrointestinal: Negative.   Genitourinary: Positive for dyspareunia, dysuria and vaginal pain. Negative for frequency and menstrual problem.      Medication List       Accurate as of 10/16/15 12:49 PM. Always use your most recent med list.          albuterol 108 (90 Base) MCG/ACT inhaler Commonly known as:  PROVENTIL HFA;VENTOLIN HFA Inhale 2 puffs into the lungs every 6 (six) hours as needed for wheezing or shortness of breath.   ALPRAZolam 0.5 MG tablet Commonly known as:  XANAX Take 0.5 mg by mouth as needed for anxiety.   AMITIZA 24 MCG capsule Generic drug:  lubiprostone TAKE  (1)  CAPSULE  TWICE DAILY WITH MEALS.   baclofen 10 MG tablet Commonly known as:  LIORESAL Take 10 mg by mouth at bedtime as needed for muscle spasms (1/2 tablet at bedtime as needed).   conjugated estrogens vaginal cream Commonly known as:  PREMARIN Place 1 Applicatorful vaginally daily.   diclofenac 75 MG EC tablet Commonly known as:  VOLTAREN   DULoxetine 60 MG capsule Commonly known as:  CYMBALTA Take 60 mg  by mouth daily.   fluconazole 150 MG tablet Commonly known as:  DIFLUCAN Take 1 tablet (150 mg total) by mouth once. May repeat every 2 days   HYDROcodone-acetaminophen 10-325 MG tablet Commonly known as:  NORCO Take 1 tablet by mouth 3 (three) times daily. Radiation pain   HYDROcodone-homatropine 5-1.5 MG/5ML syrup Commonly known as:  HYCODAN Take 10 mLs by mouth every 6 (six) hours as needed for cough.   lisinopril 40 MG tablet Commonly known as:  PRINIVIL,ZESTRIL Take 40 mg by mouth daily.   LORazepam 1 MG tablet Commonly known as:  ATIVAN Take 1 tablet (1 mg total) by mouth at bedtime.   pantoprazole 40 MG tablet Commonly known as:  PROTONIX Take 1 tablet (40 mg total) by mouth 2 (two) times daily before a meal.          Objective:    BP 129/86   Pulse 73   Temp 98.1 F (36.7 C) (Oral)   Ht '5\' 4"'$   (1.626 m)   Wt 143 lb (64.9 kg)   BMI 24.55 kg/m   No Known Allergies  Wt Readings from Last 3 Encounters:  10/16/15 143 lb (64.9 kg)  09/23/15 148 lb 3.2 oz (67.2 kg)  05/14/15 142 lb (64.4 kg)    Physical Exam  Constitutional: She appears well-developed and well-nourished.  HENT:  Head: Normocephalic and atraumatic.  Eyes: Conjunctivae and EOM are normal. Pupils are equal, round, and reactive to light.  Cardiovascular: Normal rate, regular rhythm, normal heart sounds and intact distal pulses.   Pulmonary/Chest: Effort normal and breath sounds normal.  Abdominal: Soft. Bowel sounds are normal.  Skin: Skin is warm and dry. No rash noted.  Psychiatric: She has a normal mood and affect. Her behavior is normal. Judgment and thought content normal.    Wet mount was positive for hyphae. Was negative for clue cells or trichomonas.  Results for orders placed or performed in visit on 05/14/15  Comprehensive metabolic panel  Result Value Ref Range   Sodium 140 135 - 146 mmol/L   Potassium 4.7 3.5 - 5.3 mmol/L   Chloride 102 98 - 110 mmol/L   CO2 26 20 - 31 mmol/L   Glucose, Bld 92 65 - 99 mg/dL   BUN 25 7 - 25 mg/dL   Creat 0.72 0.50 - 1.05 mg/dL   Total Bilirubin 0.5 0.2 - 1.2 mg/dL   Alkaline Phosphatase 120 33 - 130 U/L   AST 18 10 - 35 U/L   ALT 22 6 - 29 U/L   Total Protein 7.5 6.1 - 8.1 g/dL   Albumin 4.4 3.6 - 5.1 g/dL   Calcium 10.0 8.6 - 10.4 mg/dL  Lipid Profile  Result Value Ref Range   Cholesterol 231 (H) 125 - 200 mg/dL   Triglycerides 86 <150 mg/dL   HDL 96 >=46 mg/dL   Total CHOL/HDL Ratio 2.4 <=5.0 Ratio   VLDL 17 <30 mg/dL   LDL Cholesterol 118 <130 mg/dL      Assessment & Plan:   1. Vaginal itching - WET PREP FOR [place], YEAST, CLUE  2. Dyspareunia in female - conjugated estrogens (PREMARIN) vaginal cream; Place 1 Applicatorful vaginally daily.  Dispense: 42.5 g; Refill: 12  3. Postmenopausal atrophic vaginitis - conjugated estrogens (PREMARIN)  vaginal cream; Place 1 Applicatorful vaginally daily.  Dispense: 42.5 g; Refill: 12  4. Yeast vaginitis - fluconazole (DIFLUCAN) 150 MG tablet; Take 1 tablet (150 mg total) by mouth once. May repeat every 2 days  Dispense: 3 tablet; Refill: 0   Continue all other maintenance medications as listed above.  Follow up plan: Follow up as needed   Orders Placed This Encounter  Procedures  . WET PREP FOR [place], YEAST, CLUE    Educational handout given for atrophic vaginitis.  Terald Sleeper PA-C [facility] [address]  [place], [place] [address] [phone]   10/16/2015, 12:49 PM

## 2015-10-16 NOTE — Patient Instructions (Signed)

## 2015-10-22 ENCOUNTER — Other Ambulatory Visit: Payer: Self-pay | Admitting: Physician Assistant

## 2015-10-22 DIAGNOSIS — J209 Acute bronchitis, unspecified: Secondary | ICD-10-CM

## 2015-10-23 NOTE — Telephone Encounter (Signed)
Patient aware rx ready to be picked up 

## 2015-10-30 ENCOUNTER — Other Ambulatory Visit: Payer: Self-pay | Admitting: Nurse Practitioner

## 2015-10-30 ENCOUNTER — Other Ambulatory Visit: Payer: Self-pay | Admitting: Physician Assistant

## 2015-12-07 ENCOUNTER — Other Ambulatory Visit: Payer: Self-pay | Admitting: *Deleted

## 2015-12-07 DIAGNOSIS — C3400 Malignant neoplasm of unspecified main bronchus: Secondary | ICD-10-CM

## 2015-12-08 ENCOUNTER — Ambulatory Visit (INDEPENDENT_AMBULATORY_CARE_PROVIDER_SITE_OTHER): Payer: BLUE CROSS/BLUE SHIELD | Admitting: Physician Assistant

## 2015-12-08 ENCOUNTER — Encounter: Payer: Self-pay | Admitting: Physician Assistant

## 2015-12-08 VITALS — BP 156/98 | Temp 98.2°F | Ht 64.0 in | Wt 144.2 lb

## 2015-12-08 DIAGNOSIS — I10 Essential (primary) hypertension: Secondary | ICD-10-CM | POA: Diagnosis not present

## 2015-12-08 DIAGNOSIS — K219 Gastro-esophageal reflux disease without esophagitis: Secondary | ICD-10-CM | POA: Diagnosis not present

## 2015-12-08 DIAGNOSIS — Z85118 Personal history of other malignant neoplasm of bronchus and lung: Secondary | ICD-10-CM

## 2015-12-08 DIAGNOSIS — M5030 Other cervical disc degeneration, unspecified cervical region: Secondary | ICD-10-CM | POA: Diagnosis not present

## 2015-12-08 MED ORDER — HYDROCODONE-ACETAMINOPHEN 10-325 MG PO TABS: 1.0000 | ORAL_TABLET | Freq: Three times a day (TID) | ORAL | 0 refills | Status: DC | PRN

## 2015-12-08 MED ORDER — HYDROCODONE-ACETAMINOPHEN 10-325 MG PO TABS: 1.0000 | ORAL_TABLET | Freq: Three times a day (TID) | ORAL | 0 refills | Status: DC

## 2015-12-08 NOTE — Progress Notes (Signed)
[place] yearly   BP (!) 156/98 (BP Location: Right Arm, Cuff Size: Normal)   Temp 98.2 F (36.8 C) (Oral)   Ht '5\' 4"'$  (1.626 m)   Wt 144 lb 3.2 oz (65.4 kg)   BMI 24.75 kg/m    Subjective:    Patient ID: [name], female    DOB: 1960-10-29, 55 y.o.   MRN: [id number]  HPI: [name] is a 55 y.o. female presenting on 12/08/2015 for Medication Refill and Sore (on the inside of nose ) Encounter for Chronic pain is at the bottom of the note. She has long-term pain from the radiation to her upper shoulder for her lung cancer. 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 She went through injections with Dr. Carloyn Manner and had some improvement but never resolved it. She has had some chiropractic treatment but gives minimal relief. 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 Most of the time she will use the hydrocodone 2 times up to 3 times a day. At most she may take it 5 times. It is more related to the activity that she has been doing.  She has been having a irritated area on the inside of her left nostril. She can feel the soreness through her skin on the outside of the nose. She denies any bleeding or pus. She denies any fever or chills.  Past Medical History:  Diagnosis Date  . Anxiety   . Constipation 02/17/2015  . Fibromyalgia   . Hypertension   . Lung cancer ([place])    pancoast tumor  . Menopausal vaginal dryness 02/17/2015   Relevant past medical, surgical, family and social history reviewed and updated as indicated. Interim medical history since our last visit reviewed. Allergies and medications reviewed and updated. DATA REVIEWED: CHART IN EPIC  Social History   Social History  . Marital status: Married    Spouse name: N/A  . Number of children: N/A  . Years of education: N/A   Occupational History  . Not on file.   Social History Main Topics  . Smoking status: Former Smoker    Types: Cigarettes      Quit date: 02/04/2010  . Smokeless tobacco: Never Used     Comment: Quit x 5 years  . Alcohol use No  . Drug use: No  . Sexual activity: Yes    Birth control/ protection: Post-menopausal   Other Topics Concern  . Not on file   Social History Narrative  . No narrative on file    Past Surgical History:  Procedure Laterality Date  . COLONOSCOPY  12/2010   in [place], per patient, normal, next colonoscopy 10 years  . ESOPHAGOGASTRODUODENOSCOPY N/A 10/16/2013   DPO:EUMPNTIR ring s/p dilation/2 cm HH  . 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 Hiatal hernia  . LAPAROSCOPIC APPENDECTOMY N/A 06/21/2013   Procedure: APPENDECTOMY LAPAROSCOPIC;  Surgeon: Jamesetta So, MD;  Location: [facility];  Service: General;  Laterality: N/A;  Venia Minks DILATION N/A 10/16/2013   Procedure: Keturah Shavers;  Surgeon: Daneil Dolin, MD;  Location: [facility];  Service: Endoscopy;  Laterality: N/A;  . PORTACATH PLACEMENT  [id number]   Rt   IJ  Port-A-Cath  [email or web address]  . STERNAL WIRES REMOVAL  01/23/2012   Procedure: STERNAL WIRES REMOVAL;  Surgeon: Grace Isaac, MD;  Location: [place];  Service: Thoracic;  Laterality: N/A;  .  THORACOTOMY  [id number]   transsmanubrial anterolaterl thoracotomy with chest wall resection includinf the first rib and left  upper lobectomy for Pancoast turmor  . TUBAL LIGATION      Family History  Problem Relation Age of Onset  . Cancer Mother     lung  . Hypertension Father   . Stroke Father   . Hypertension Sister   . Heart disease Sister   . Cancer Brother     pancreatic  . Colon cancer Neg Hx     Review of Systems  Constitutional: Negative.  Negative for activity change, fatigue and fever.  HENT: Negative.  Negative for nosebleeds, postnasal drip and sneezing.   Eyes: Negative.   Respiratory: Negative.  Negative for cough.   Cardiovascular: Negative.  Negative for chest pain.   Gastrointestinal: Negative.  Negative for abdominal pain.  Endocrine: Negative.   Genitourinary: Negative.  Negative for dysuria.  Musculoskeletal: Positive for back pain, myalgias, neck pain and neck stiffness.  Skin: Negative.   Neurological: Negative.       Medication List       Accurate as of 12/08/15  5:10 PM. Always use your most recent med list.          albuterol 108 (90 Base) MCG/ACT inhaler Commonly known as:  PROVENTIL HFA;VENTOLIN HFA Inhale 2 puffs into the lungs every 6 (six) hours as needed for wheezing or shortness of breath.   ALPRAZolam 0.5 MG tablet Commonly known as:  XANAX Take 0.5 mg by mouth as needed for anxiety.   AMITIZA 24 MCG capsule Generic drug:  lubiprostone TAKE  (1)  CAPSULE  TWICE DAILY WITH MEALS.   conjugated estrogens vaginal cream Commonly known as:  PREMARIN Place 1 Applicatorful vaginally daily.   diclofenac 75 MG EC tablet Commonly known as:  VOLTAREN   DULoxetine 60 MG capsule Commonly known as:  CYMBALTA Take 60 mg by mouth daily.   HYDROcodone-acetaminophen 10-325 MG tablet Commonly known as:  NORCO Take 1-2 tablets by mouth 3 (three) times daily. Radiation pain and DDD   HYDROcodone-acetaminophen 10-325 MG tablet Commonly known as:  NORCO Take 1-2 tablets by mouth every 8 (eight) hours as needed. Radiation pain and DDD   HYDROcodone-acetaminophen 10-325 MG tablet Commonly known as:  NORCO Take 1-2 tablets by mouth every 8 (eight) hours as needed. Radiation pain and DDD   HYDROcodone-homatropine 5-1.5 MG/5ML syrup Commonly known as:  HYCODAN TAKE 2 TEASPOONSFUL EVERY 6 HOURS AS NEEED FOR COUGH   lisinopril 40 MG tablet Commonly known as:  PRINIVIL,ZESTRIL TAKE 1 TABLET DAILY   LORazepam 1 MG tablet Commonly known as:  ATIVAN Take 1 tablet (1 mg total) by mouth at bedtime.   pantoprazole 40 MG tablet Commonly known as:  PROTONIX Take 1 tablet (40 mg total) by mouth 2 (two) times daily before a meal.           Objective:    BP (!) 156/98 (BP Location: Right Arm, Cuff Size: Normal)   Temp 98.2 F (36.8 C) (Oral)   Ht '5\' 4"'$  (1.626 m)   Wt 144 lb 3.2 oz (65.4 kg)   BMI 24.75 kg/m   No Known Allergies  Wt Readings from Last 3 Encounters:  12/08/15 144 lb 3.2 oz (65.4 kg)  10/16/15 143 lb (64.9 kg)  09/23/15 148 lb 3.2 oz (67.2 kg)    Physical Exam  Constitutional: She is oriented to person, place, and time. She appears well-developed and well-nourished.  HENT:  Head: Normocephalic and  atraumatic.  Nose: Sinus tenderness present. No mucosal edema or rhinorrhea.  Eyes: Conjunctivae and EOM are normal. Pupils are equal, round, and reactive to light.  Cardiovascular: Normal rate, regular rhythm, normal heart sounds and intact distal pulses.   Pulmonary/Chest: Effort normal and breath sounds normal.  Abdominal: Soft. Bowel sounds are normal.  Musculoskeletal:       Lumbar back: She exhibits decreased range of motion, tenderness, pain and spasm.  Neurological: She is alert and oriented to person, place, and time. She has normal reflexes.  Skin: Skin is warm and dry. No rash noted.  Psychiatric: She has a normal mood and affect. Her behavior is normal. Judgment and thought content normal.  Nursing note and vitals reviewed.       Assessment & Plan:   1. DDD (degenerative disc disease), cervical - HYDROcodone-acetaminophen (NORCO) 10-325 MG tablet; Take 1-2 tablets by mouth 3 (three) times daily. Radiation pain and DDD  Dispense: 180 tablet; Refill: 0 - HYDROcodone-acetaminophen (NORCO) 10-325 MG tablet; Take 1-2 tablets by mouth every 8 (eight) hours as needed. Radiation pain and DDD  Dispense: 30 tablet; Refill: 0 - HYDROcodone-acetaminophen (NORCO) 10-325 MG tablet; Take 1-2 tablets by mouth every 8 (eight) hours as needed. Radiation pain and DDD  Dispense: 180 tablet; Refill: 0  2. Essential hypertension  3. Gastroesophageal reflux disease without esophagitis  4. History of  lung cancer  5. Nasal cellulitis Neosporin ointment QID   Continue all other maintenance medications as listed above.  Follow up plan: Return in about 6 months (around 06/06/2016) for recheck meds.  Educational handout given for Antonito PA-C [facility] [address]  [place], [place] [address] [phone]   12/08/2015,   [place] Controlled Substance Abuse Database reviewed- Yes If yes- were their any concerning findings : no Depression screen [facility] 2/9 10/16/2015 09/23/2015  Decreased Interest 0 0  Down, Depressed, Hopeless 0 0  PHQ - 2 Score 0 0     [place] drug screen performed- No  SOAPP 0= never  1= seldom  2=sometimes  3= often  4= very often  1. How often do you have mood swings? 0 2. How often do you smoke a cigarette within an hour after waling up? 0 3. How often have you taken medication other than the way that it was prescribed?0 4. How often have you used illegal drugs in the past 5 years? 0 5. How often, in your lifetime, have you had legal problems or been arrested? 0  Score 0  Alcohol Audit - How often during the last year have found that you: 0-Never   1- Less than monthly   2- Monthly     3-Weekly     4-daily or almost daily  1. found that you were not able to stop drinking once you started- 0 2. failed to do what was normally expected of you because of drinking- 0 3. needed a first drink in the morning- 0 4. had a feeling of guilt or remorse after drinking- 0 5. are/were unable to remember what happened the night before because of your drinking- 0  0- NO   2- yes but not in last year  4- yes during last year 6. Have you or someone else been injured because of your drinking- 0 7. Has anyone been concerned about your drinking or suggested you cut down- 0        TOTAL- 0  ( 0-7- alcohol education, 8-15-  simple advice, 16-19 simple advice plus counseling, 20-40 referral for evaluation and treatment  0   [place]  Pain assessment: Cause of pain- DDD cervical and lumbar Pain location- neck and lumbar Pain on scale of 1-10- 9 Frequency- daily What increases pain-lifting, riding long time What makes pain Better-medication Effects on ADL - mild  Prior treatments tried and failed- injection, Pt, chiropractic, NSAIDs Current treatments- hydrocodone 10 mg, NSAIDs, cymbalta Morphine mg equivalent- 60  Pain management agreement reviewed and signed- Yes

## 2015-12-08 NOTE — Patient Instructions (Signed)
Food Choices for Gastroesophageal Reflux Disease, Adult When you have gastroesophageal reflux disease (GERD), the foods you eat and your eating habits are very important. Choosing the right foods can help ease your discomfort. What guidelines do I need to follow?  Choose fruits, vegetables, whole grains, and low-fat dairy products.  Choose low-fat meat, fish, and poultry.  Limit fats such as oils, salad dressings, butter, nuts, and avocado.  Keep a food diary. This helps you identify foods that cause symptoms.  Avoid foods that cause symptoms. These may be different for everyone.  Eat small meals often instead of 3 large meals a day.  Eat your meals slowly, in a place where you are relaxed.  Limit fried foods.  Cook foods using methods other than frying.  Avoid drinking alcohol.  Avoid drinking large amounts of liquids with your meals.  Avoid bending over or lying down until 2-3 hours after eating. What foods are not recommended? These are some foods and drinks that may make your symptoms worse: Vegetables  Tomatoes. Tomato juice. Tomato and spaghetti sauce. Chili peppers. Onion and garlic. Horseradish. Fruits  Oranges, grapefruit, and lemon (fruit and juice). Meats  High-fat meats, fish, and poultry. This includes hot dogs, ribs, ham, sausage, salami, and bacon. Dairy  Whole milk and chocolate milk. Sour cream. Cream. Butter. Ice cream. Cream cheese. Drinks  Coffee and tea. Bubbly (carbonated) drinks or energy drinks. Condiments  Hot sauce. Barbecue sauce. Sweets/Desserts  Chocolate and cocoa. Donuts. Peppermint and spearmint. Fats and Oils  High-fat foods. This includes French fries and potato chips. Other  Vinegar. Strong spices. This includes black pepper, white pepper, red pepper, cayenne, curry powder, cloves, ginger, and chili powder. The items listed above may not be a complete list of foods and drinks to avoid. Contact your dietitian for more information.    This information is not intended to replace advice given to you by your health care provider. Make sure you discuss any questions you have with your health care provider. Document Released: 07/12/2011 Document Revised: 06/18/2015 Document Reviewed: 11/14/2012 Elsevier Interactive Patient Education  2017 Elsevier Inc.  

## 2016-01-11 ENCOUNTER — Other Ambulatory Visit: Payer: Self-pay | Admitting: Cardiothoracic Surgery

## 2016-01-14 ENCOUNTER — Encounter: Payer: Self-pay | Admitting: Cardiothoracic Surgery

## 2016-01-14 ENCOUNTER — Telehealth: Payer: Self-pay | Admitting: Physician Assistant

## 2016-01-14 ENCOUNTER — Ambulatory Visit (INDEPENDENT_AMBULATORY_CARE_PROVIDER_SITE_OTHER): Payer: BLUE CROSS/BLUE SHIELD | Admitting: Cardiothoracic Surgery

## 2016-01-14 ENCOUNTER — Ambulatory Visit
Admission: RE | Admit: 2016-01-14 | Discharge: 2016-01-14 | Disposition: A | Discharge status: Home / Self Care | Payer: BLUE CROSS/BLUE SHIELD | Source: Ambulatory Visit | Attending: Cardiothoracic Surgery | Admitting: Cardiothoracic Surgery

## 2016-01-14 VITALS — BP 142/91 | HR 75 | Resp 16 | Ht 64.0 in | Wt 144.0 lb

## 2016-01-14 DIAGNOSIS — K8020 Calculus of gallbladder without cholecystitis without obstruction: Secondary | ICD-10-CM

## 2016-01-14 DIAGNOSIS — Z902 Acquired absence of lung [part of]: Secondary | ICD-10-CM

## 2016-01-14 DIAGNOSIS — C3400 Malignant neoplasm of unspecified main bronchus: Secondary | ICD-10-CM

## 2016-01-14 DIAGNOSIS — C3412 Malignant neoplasm of upper lobe, left bronchus or lung: Secondary | ICD-10-CM | POA: Diagnosis not present

## 2016-01-14 NOTE — Telephone Encounter (Signed)
Pt saw Dr. Servando Snare today & had her annual chest CT for her lung cancer. Gallstones were noted on the CT, she is needing referral for further testing for the gallstones. If possible for next week.

## 2016-01-14 NOTE — Progress Notes (Signed)
[name] Record #[id number] Date of Birth: May 03, 1960  Octavio Graves, DO Terald Sleeper, PA-C  Chief Complaint:   PostOp Follow Up Visit 06/25/2010  PREOPERATIVE DIAGNOSIS: Pancoast tumor.  POSTOPERATIVE DIAGNOSIS: Pancoast tumor.  PROCEDURE PERFORMED: Transmanubrial anterolateral thoracotomy with  chest wall resection including the first rib and left upper lobectomy  for Pancoast tumor.  ypTX , pN0   History of Present Illness:      Patient returns for followup visit today now little over a 5 1/2 years  after resection of Pancoast tumor through a trans-manubrial anterolateral thoracotomy with chest wall resection. She's done well postoperatively notes that she has full range of motion and function of her left arm. She is not smoking.  She does note episodic right upper quadrant discomfort.   History  Smoking Status  . Former Smoker  . Types: Cigarettes  . Quit date: 02/04/2010  Smokeless Tobacco  . Never Used    Comment: Quit x 5 years       No Known Allergies  Current Outpatient Prescriptions  Medication Sig Dispense Refill  . albuterol (PROVENTIL HFA;VENTOLIN HFA) 108 (90 Base) MCG/ACT inhaler Inhale 2 puffs into the lungs every 6 (six) hours as needed for wheezing or shortness of breath. 1 Inhaler 1  . ALPRAZolam (XANAX) 0.5 MG tablet Take 0.5 mg by mouth as needed for anxiety.    . AMITIZA 24 MCG capsule TAKE  (1)  CAPSULE  TWICE DAILY WITH MEALS. 60 capsule 3  . conjugated estrogens (PREMARIN) vaginal cream Place 1 Applicatorful vaginally daily. 42.5 g 12  . diclofenac (VOLTAREN) 75 MG EC tablet   0  . DULoxetine (CYMBALTA) 60 MG capsule Take 60 mg by mouth daily.   4  . HYDROcodone-acetaminophen (NORCO) 10-325 MG tablet Take 1-2 tablets by mouth every 8 (eight) hours as needed. Radiation pain and DDD (Patient taking differently: Take 1-2 tablets by mouth every 8 (eight) hours as needed  (TAKES 1/2 QID PRN per pt). Radiation pain and DDD) 180 tablet 0  . HYDROcodone-homatropine (HYCODAN) 5-1.5 MG/5ML syrup TAKE 2 TEASPOONSFUL EVERY 6 HOURS AS NEEED FOR COUGH 240 mL 0  . lisinopril (PRINIVIL,ZESTRIL) 40 MG tablet TAKE 1 TABLET DAILY 30 tablet 11  . LORazepam (ATIVAN) 1 MG tablet Take 1 tablet (1 mg total) by mouth at bedtime. 30 tablet 1  . pantoprazole (PROTONIX) 40 MG tablet Take 1 tablet (40 mg total) by mouth 2 (two) times daily before a meal. 60 tablet 5   No current facility-administered medications for this visit.        Physical Exam: BP (!) 142/91 (BP Location: Right Arm, Patient Position: Sitting, Cuff Size: Normal)   Pulse 75   Resp 16   Ht '5\' 4"'$  (1.626 m)   Wt 144 lb (65.3 kg)   SpO2 96% Comment: ON RA  BMI 24.72 kg/m   General appearance: alert and cooperative Neurologic: intact Heart: regular rate and rhythm, S1, S2 normal, no murmur, click, rub or gallop and normal apical impulse Lungs: clear to auscultation bilaterally Wound: Her left chest incision is well-healed she has full range of motion of her left shoulder I do not appreciate any cervical supraclavicular or axillary adenopathy No abdominal tenderness or mass  Diagnostic Studies & Laboratory data:         Recent Radiology Findings: Ct Chest Wo Contrast  Result Date: 01/14/2016 CLINICAL DATA:  Lung cancer status post surgery in 2012. Chemotherapy and radiation therapy completed. Shortness of breath on exertion. EXAM: CT CHEST WITHOUT CONTRAST TECHNIQUE: Multidetector CT imaging of the chest was performed following the standard protocol without IV contrast. COMPARISON:  Chest CT 01/15/2015 and 01/09/2014. FINDINGS: Cardiovascular: No significant vascular findings on noncontrast imaging. The heart size is normal. There is no pericardial effusion. Mediastinum/Nodes: There are no enlarged mediastinal, hilar or axillary lymph nodes.Hilar assessment is limited by the lack of intravenous contrast,  although the hilar contours appear unchanged. The thyroid gland, trachea and esophagus demonstrate no significant findings. Lungs/Pleura: There is no pleural effusion. Status post left upper lobe resection. Left apical pleural thickening and moderate emphysematous changes are again noted. No suspicious pulmonary nodules, endobronchial lesions or confluent airspace opacity. Upper abdomen: Cholelithiasis. Stable tubular low-density in the right retrocrural space, likely dilatation of the thoracic duct. The visualized upper abdomen otherwise appears unremarkable as imaged in the noncontrast state. Musculoskeletal/Chest wall: There are stable postsurgical changes in the left first rib status post partial resection and within the sternal manubrium. No lytic lesions are identified. IMPRESSION: 1. Stable postop chest CT. No evidence of local recurrence or metastatic disease. 2. Emphysema. 3. Cholelithiasis. Electronically Signed   By: [name] M.D.   On: 01/14/2016 13:52      Recent Labs: Lab Results  Component Value Date   WBC 7.6 02/17/2015   HGB 12.9 09/08/2013   HCT 38.4 02/17/2015   PLT 335 02/17/2015   GLUCOSE 92 05/14/2015   CHOL 231 (H) 05/14/2015   TRIG 86 05/14/2015   HDL 96 05/14/2015   LDLCALC 118 05/14/2015   ALT 22 05/14/2015   AST 18 05/14/2015   NA 140 05/14/2015   K 4.7 05/14/2015   CL 102 05/14/2015   CREATININE 0.72 05/14/2015   BUN 25 05/14/2015   CO2 26 05/14/2015   TSH 1.720 02/17/2015   INR 0.93 01/23/2012   HGBA1C 5.9 (H) 02/17/2015      Assessment / Plan:    Patient is 5.5years  months following  chest wall resection and left upper lobectomy for moderately differentiated adenocarcinoma/Pancoast tumor, 5 years from dx and starting treatment with chemo radiation  ypTX , pN0 By Physical exam and CT there is no evidence of recurrent disease. Patient remains smoke free since surgery , previous she has 34 pack year smoking history CT scan notes gallstones- she  will discuss with primary care the evaluation of this especially in light of episodic epigastric discomfort. Will evaluate if she is eligiable for Lung cancer screening CT's , other wise I will see her back in one year  Grace Isaac MD

## 2016-01-14 NOTE — Telephone Encounter (Signed)
Considering complex nature of patients medical Hx I am ok with her seeing general surgery to discuss options or following up here.   We could consider [place] or [place] Scan.   Laroy Apple, MD [facility] 01/14/2016, 5:25 PM

## 2016-01-15 NOTE — Addendum Note (Signed)
Addended by: Thana Ates on: 01/15/2016 09:26 AM   Modules accepted: Orders

## 2016-01-15 NOTE — Telephone Encounter (Signed)
Patient aware and would like to General surgeon in [place].

## 2016-01-19 ENCOUNTER — Telehealth: Payer: Self-pay | Admitting: Physician Assistant

## 2016-01-19 DIAGNOSIS — K8020 Calculus of gallbladder without cholecystitis without obstruction: Secondary | ICD-10-CM

## 2016-01-19 NOTE — Addendum Note (Signed)
Addended by: Zannie Cove on: 01/19/2016 04:27 PM   Modules accepted: Orders

## 2016-01-19 NOTE — Telephone Encounter (Addendum)
Pt aware.  She states some of her meds can affect her liver and she wants some labs drawn - (last cmp was 05/14/15)  Can you order these and she will come by this week.

## 2016-01-19 NOTE — Telephone Encounter (Signed)
May order CMP for check on liver functons.  Dx: cholelithiasis

## 2016-01-19 NOTE — Telephone Encounter (Signed)
This is dup encounter

## 2016-01-19 NOTE — Telephone Encounter (Signed)
IMPRESSION:  1. Stable postop chest CT. No evidence of local recurrence or metastatic disease.  2. Emphysema.  3. Cholelithiasis.  This was the summary of her CT, so the area of lung cancer is still clear.  There are chronic lung changes, which we will watch long term.  There is a gall bladder stone.  If she is having issues she can be referred to a general surgeon for consult.

## 2016-01-19 NOTE — Telephone Encounter (Signed)
LM 12/26-jhb

## 2016-01-20 ENCOUNTER — Other Ambulatory Visit: Payer: Self-pay | Admitting: Physician Assistant

## 2016-02-01 ENCOUNTER — Other Ambulatory Visit: Payer: Self-pay | Admitting: Physician Assistant

## 2016-02-04 NOTE — Telephone Encounter (Signed)
Refill called to [name] 

## 2016-02-05 ENCOUNTER — Other Ambulatory Visit: Payer: BLUE CROSS/BLUE SHIELD

## 2016-02-05 DIAGNOSIS — K8020 Calculus of gallbladder without cholecystitis without obstruction: Secondary | ICD-10-CM

## 2016-02-06 LAB — CMP14+EGFR
A/G RATIO: 1.5 (ref 1.2–2.2)
ALBUMIN: 4.3 g/dL (ref 3.5–5.5)
ALT: 28 IU/L (ref 0–32)
AST: 25 IU/L (ref 0–40)
Alkaline Phosphatase: 141 IU/L — ABNORMAL HIGH (ref 39–117)
BUN / CREAT RATIO: 21 (ref 9–23)
BUN: 19 mg/dL (ref 6–24)
Bilirubin Total: 0.3 mg/dL (ref 0.0–1.2)
CO2: 24 mmol/L (ref 18–29)
CREATININE: 0.89 mg/dL (ref 0.57–1.00)
Calcium: 9.6 mg/dL (ref 8.7–10.2)
Chloride: 98 mmol/L (ref 96–106)
GFR calc non Af Amer: 73 mL/min/{1.73_m2} (ref >59)
GFR, EST AFRICAN AMERICAN: 84 mL/min/{1.73_m2} (ref >59)
GLOBULIN, TOTAL: 2.8 g/dL (ref 1.5–4.5)
Glucose: 91 mg/dL (ref 65–99)
POTASSIUM: 4.1 mmol/L (ref 3.5–5.2)
SODIUM: 139 mmol/L (ref 134–144)
TOTAL PROTEIN: 7.1 g/dL (ref 6.0–8.5)

## 2016-02-07 ENCOUNTER — Encounter: Payer: Self-pay | Admitting: Physician Assistant

## 2016-02-22 ENCOUNTER — Other Ambulatory Visit: Payer: Self-pay | Admitting: Family Medicine

## 2016-02-22 DIAGNOSIS — M5030 Other cervical disc degeneration, unspecified cervical region: Secondary | ICD-10-CM

## 2016-02-24 ENCOUNTER — Other Ambulatory Visit: Payer: BLUE CROSS/BLUE SHIELD | Admitting: Adult Health

## 2016-02-29 ENCOUNTER — Other Ambulatory Visit: Payer: Self-pay | Admitting: Family Medicine

## 2016-02-29 DIAGNOSIS — M5030 Other cervical disc degeneration, unspecified cervical region: Secondary | ICD-10-CM

## 2016-03-04 ENCOUNTER — Encounter: Payer: Self-pay | Admitting: Physician Assistant

## 2016-03-04 ENCOUNTER — Other Ambulatory Visit: Payer: Self-pay | Admitting: Physician Assistant

## 2016-03-04 DIAGNOSIS — M5030 Other cervical disc degeneration, unspecified cervical region: Secondary | ICD-10-CM

## 2016-03-04 MED ORDER — HYDROCODONE-ACETAMINOPHEN 10-325 MG PO TABS: 1.0000 | ORAL_TABLET | Freq: Three times a day (TID) | ORAL | 0 refills | Status: DC | PRN

## 2016-03-04 NOTE — Telephone Encounter (Signed)
Pt aware written Rx is at front desk ready for pickup  

## 2016-03-25 NOTE — H&P (Signed)
NTS SOAP Note  Vital Signs:  Vitals as of: 06/28/4648: Systolic 354: Diastolic 95: Heart Rate 73: Temp 99.95F (Temporal): Height 38f 4in: Weight 149Lbs 0 Ounces: BMI 25.58   BMI : 25.58 kg/m2  Subjective: This 56year old female presents for of gallstones.  Was found on CT scan of the abdomen for unrelated issues.  Currently has no right upper quadrant abdominal pain, nausea, bloating, fatty food intolerance, fever, chills, jaundice.  States she may have had some epigastric pain several months ago, but it did not radiate to the right flank.  Referred by Dr. BWendi Snipes  Review of Symptoms:  Constitutional:fatigue Head:negative Eyes:negative Nose/Mouth/Throat:negative Cardiovascular:negative Respiratory:dyspnea, wheezing Gastrointestinheartburn, dyspepsia Genitourinary:negative neck, back, and joint pain Skin:negative Hematolgic/Lymphatic:negative Allergic/Immunologic:negative   Past Medical History:Reviewed  Past Medical History  Surgical History: left thoracotomy, appendectomy 2015 Medical Problems:  lung CA, HTN, anxiety Allergies: nkda Medications: albuterol, alprazolam, amitiza, voltaren, cymbalta, norco, lisinopril, protonix   Social History:Reviewed  Social History  Preferred Language: English Race:  White Ethnicity: Not Hispanic / Latino Age: [age]year Marital Status:  M Alcohol: socially   Smoking Status: Former smoker reviewed on 02/25/2016 Started Date: 01/24/2010 Stopped Date:  Functional Status reviewed on 02/25/2016 ------------------------------------------------ Bathing: Normal Cooking: Normal Dressing: Normal Driving: Normal Eating: Normal Managing Meds: Normal Oral Care: Normal Shopping: Normal Toileting: Normal Transferring: Normal Walking: Normal Cognitive Status reviewed on 02/25/2016 ------------------------------------------------ Attention: Normal Decision Making: Normal Language: Normal Memory: Normal Motor:  Normal Perception: Normal Problem Solving: Normal Visual and Spatial: Normal   Family History:Reviewed  Family Health History Mother, Deceased; Lung cancer;  Father, Deceased; Healthy;     Objective Information: General:Well appearing, well nourished in no distress. Skin:no rash or prominent lesions Head:Atraumatic; no masses; no abnormalities Neck:Supple without lymphadenopathy.  Heart:RRR, no murmur or gallop.  Normal S1, S2.  No S3, S4.  Lungs:CTA bilaterally, no wheezes, rhonchi, rales.  Breathing unlabored. Abdomen:Soft, NT/ND, normal bowel sounds, no HSM, no masses.  No peritoneal signs. CT report reviewed, Dr. BAlen Bleachernotes reviewed. Assessment:Cholelithiasis, asymptomatic  Diagnoses: 574.20  K80.20 Gallstone (Calculus of gallbladder without cholecystitis without obstruction)  Procedures: [id number]- OFFICE OUTPATIENT VISIT 25 MINUTES    Plan:  Scheduled for laparoscopic cholecystectomy on 04/01/2016. The risks and benefits of the procedure including bleeding, infection, hepatobiliary injury, and the possibility of an open procedure were fully explained to the patient, who gave informed consent.

## 2016-03-25 NOTE — Patient Instructions (Signed)
[name]  03/25/2016     '@PREFPERIOPPHARMACY'$ @   Your procedure is scheduled on  04/01/2016   Report to [facility] at   720  A.M.  Call this number if you have problems the morning of surgery:  [phone]   Remember:  Do not eat food or drink liquids after midnight.  Take these medicines the morning of surgery with A SIP OF WATER  Cymbalta, hydrocodone, lisinopril, protonix. Take your inhaler before you come.   Do not wear jewelry, make-up or nail polish.  Do not wear lotions, powders, or perfumes, or deoderant.  Do not shave 48 hours prior to surgery.  Men may shave face and neck.  Do not bring valuables to the hospital.  [facility] is not responsible for any belongings or valuables.  Contacts, dentures or bridgework may not be worn into surgery.  Leave your suitcase in the car.  After surgery it may be brought to your room.  For patients admitted to the hospital, discharge time will be determined by your treatment team.  Patients discharged the day of surgery will not be allowed to drive home.   Name and phone number of your driver:   family Special instructions:  None  Please read over the following fact sheets that you were given. Anesthesia Post-op Instructions and Care and Recovery After Surgery       Laparoscopic Cholecystectomy Laparoscopic cholecystectomy is surgery to remove the gallbladder. The gallbladder is a pear-shaped organ that lies beneath the liver on the right side of the body. The gallbladder stores bile, which is a fluid that helps the body to digest fats. Cholecystectomy is often done for inflammation of the gallbladder (cholecystitis). This condition is usually caused by a buildup of gallstones (cholelithiasis) in the gallbladder. Gallstones can block the flow of bile, which can result in inflammation and pain. In severe cases, emergency surgery may be required. This procedure is done though small incisions in your abdomen  (laparoscopic surgery). A thin scope with a camera (laparoscope) is inserted through one incision. Thin surgical instruments are inserted through the other incisions. In some cases, a laparoscopic procedure may be turned into a type of surgery that is done through a larger incision (open surgery). Tell a health care provider about:  Any allergies you have.  All medicines you are taking, including vitamins, herbs, eye drops, creams, and over-the-counter medicines.  Any problems you or family members have had with anesthetic medicines.  Any blood disorders you have.  Any surgeries you have had.  Any medical conditions you have.  Whether you are pregnant or may be pregnant. What are the risks? Generally, this is a safe procedure. However, problems may occur, including:  Infection.  Bleeding.  Allergic reactions to medicines.  Damage to other structures or organs.  A stone remaining in the common bile duct. The common bile duct carries bile from the gallbladder into the small intestine.  A bile leak from the cyst duct that is clipped when your gallbladder is removed. What happens before the procedure? Staying hydrated  Follow instructions from your health care provider about hydration, which may include:  Up to 2 hours before the procedure - you may continue to drink clear liquids, such as water, clear fruit juice, black coffee, and plain tea. Eating and drinking restrictions  Follow instructions from your health care provider about eating and drinking, which may include:  8 hours before the procedure - stop  eating heavy meals or foods such as meat, fried foods, or fatty foods.  6 hours before the procedure - stop eating light meals or foods, such as toast or cereal.  6 hours before the procedure - stop drinking milk or drinks that contain milk.  2 hours before the procedure - stop drinking clear liquids. Medicines   Ask your health care provider about:  Changing or  stopping your regular medicines. This is especially important if you are taking diabetes medicines or blood thinners.  Taking medicines such as aspirin and ibuprofen. These medicines can thin your blood. Do not take these medicines before your procedure if your health care provider instructs you not to.  You may be given antibiotic medicine to help prevent infection. General instructions   Let your health care provider know if you develop a cold or an infection before surgery.  Plan to have someone take you home from the hospital or clinic.  Ask your health care provider how your surgical site will be marked or identified. What happens during the procedure?  To reduce your risk of infection:  Your health care team will wash or sanitize their hands.  Your skin will be washed with soap.  Hair may be removed from the surgical area.  An IV tube may be inserted into one of your veins.  You will be given one or more of the following:  A medicine to help you relax (sedative).  A medicine to make you fall asleep (general anesthetic).  A breathing tube will be placed in your mouth.  Your surgeon will make several small cuts (incisions) in your abdomen.  The laparoscope will be inserted through one of the small incisions. The camera on the laparoscope will send images to a TV screen (monitor) in the operating room. This lets your surgeon see inside your abdomen.  Air-like gas will be pumped into your abdomen. This will expand your abdomen to give the surgeon more room to perform the surgery.  Other tools that are needed for the procedure will be inserted through the other incisions. The gallbladder will be removed through one of the incisions.  Your common bile duct may be examined. If stones are found in the common bile duct, they may be removed.  After your gallbladder has been removed, the incisions will be closed with stitches (sutures), staples, or skin glue.  Your incisions  may be covered with a bandage (dressing). The procedure may vary among health care providers and hospitals. What happens after the procedure?  Your blood pressure, heart rate, breathing rate, and blood oxygen level will be monitored until the medicines you were given have worn off.  You will be given medicines as needed to control your pain.  Do not drive for 24 hours if you were given a sedative. This information is not intended to replace advice given to you by your health care provider. Make sure you discuss any questions you have with your health care provider. Document Released: 01/10/2005 Document Revised: 08/02/2015 Document Reviewed: 06/29/2015 Elsevier Interactive Patient Education  2017 [employer]  Laparoscopic Cholecystectomy, Care After This sheet gives you information about how to care for yourself after your procedure. Your health care provider may also give you more specific instructions. If you have problems or questions, contact your health care provider. What can I expect after the procedure? After the procedure, it is common to have:  Pain at your incision sites. You will be given medicines to control this pain.  Mild nausea or vomiting.  Bloating and possible shoulder pain from the air-like gas that was used during the procedure. Follow these instructions at home: Incision care    Follow instructions from your health care provider about how to take care of your incisions. Make sure you:  Wash your hands with soap and water before you change your bandage (dressing). If soap and water are not available, use hand sanitizer.  Change your dressing as told by your health care provider.  Leave stitches (sutures), skin glue, or adhesive strips in place. These skin closures may need to be in place for 2 weeks or longer. If adhesive strip edges start to loosen and curl up, you may trim the loose edges. Do not remove adhesive strips completely unless your health care  provider tells you to do that.  Do not take baths, swim, or use a hot tub until your health care provider approves. Ask your health care provider if you can take showers. You may only be allowed to take sponge baths for bathing.  Check your incision area every day for signs of infection. Check for:  More redness, swelling, or pain.  More fluid or blood.  Warmth.  Pus or a bad smell. Activity   Do not drive or use heavy machinery while taking prescription pain medicine.  Do not lift anything that is heavier than 10 lb (4.5 kg) until your health care provider approves.  Do not play contact sports until your health care provider approves.  Do not drive for 24 hours if you were given a medicine to help you relax (sedative).  Rest as needed. Do not return to work or school until your health care provider approves. General instructions   Take over-the-counter and prescription medicines only as told by your health care provider.  To prevent or treat constipation while you are taking prescription pain medicine, your health care provider may recommend that you:  Drink enough fluid to keep your urine clear or pale yellow.  Take over-the-counter or prescription medicines.  Eat foods that are high in fiber, such as fresh fruits and vegetables, whole grains, and beans.  Limit foods that are high in fat and processed sugars, such as fried and sweet foods. Contact a health care provider if:  You develop a rash.  You have more redness, swelling, or pain around your incisions.  You have more fluid or blood coming from your incisions.  Your incisions feel warm to the touch.  You have pus or a bad smell coming from your incisions.  You have a fever.  One or more of your incisions breaks open. Get help right away if:  You have trouble breathing.  You have chest pain.  You have increasing pain in your shoulders.  You faint or feel dizzy when you stand.  You have severe pain  in your abdomen.  You have nausea or vomiting that lasts for more than one day.  You have leg pain. This information is not intended to replace advice given to you by your health care provider. Make sure you discuss any questions you have with your health care provider. Document Released: 01/10/2005 Document Revised: 08/01/2015 Document Reviewed: 06/29/2015 Elsevier Interactive Patient Education  2017 [place] Anesthesia, Adult General anesthesia is the use of medicines to make a person "go to sleep" (be unconscious) for a medical procedure. General anesthesia is often recommended when a procedure:  Is long.  Requires you to be still or in an unusual  position.  Is major and can cause you to lose blood.  Is impossible to do without general anesthesia. The medicines used for general anesthesia are called general anesthetics. In addition to making you sleep, the medicines:  Prevent pain.  Control your blood pressure.  Relax your muscles. Tell a health care provider about:  Any allergies you have.  All medicines you are taking, including vitamins, herbs, eye drops, creams, and over-the-counter medicines.  Any problems you or family members have had with anesthetic medicines.  Types of anesthetics you have had in the past.  Any bleeding disorders you have.  Any surgeries you have had.  Any medical conditions you have.  Any history of heart or lung conditions, such as heart failure, sleep apnea, or chronic obstructive pulmonary disease (COPD).  Whether you are pregnant or may be pregnant.  Whether you use tobacco, alcohol, marijuana, or street drugs.  Any history of Armed forces logistics/support/administrative officer.  Any history of depression or anxiety. What are the risks? Generally, this is a safe procedure. However, problems may occur, including:  Allergic reaction to anesthetics.  Lung and heart problems.  Inhaling food or liquids from your stomach into your lungs  (aspiration).  Injury to nerves.  Waking up during your procedure and being unable to move (rare).  Extreme agitation or a state of mental confusion (delirium) when you wake up from the anesthetic.  Air in the bloodstream, which can lead to stroke. These problems are more likely to develop if you are having a major surgery or if you have an advanced medical condition. You can prevent some of these complications by answering all of your health care provider's questions thoroughly and by following all pre-procedure instructions. General anesthesia can cause side effects, including:  Nausea or vomiting  A sore throat from the breathing tube.  Feeling cold or shivery.  Feeling tired, washed out, or achy.  Sleepiness or drowsiness.  Confusion or agitation. What happens before the procedure? Staying hydrated  Follow instructions from your health care provider about hydration, which may include:  Up to 2 hours before the procedure - you may continue to drink clear liquids, such as water, clear fruit juice, black coffee, and plain tea. Eating and drinking restrictions  Follow instructions from your health care provider about eating and drinking, which may include:  8 hours before the procedure - stop eating heavy meals or foods such as meat, fried foods, or fatty foods.  6 hours before the procedure - stop eating light meals or foods, such as toast or cereal.  6 hours before the procedure - stop drinking milk or drinks that contain milk.  2 hours before the procedure - stop drinking clear liquids. Medicines   Ask your health care provider about:  Changing or stopping your regular medicines. This is especially important if you are taking diabetes medicines or blood thinners.  Taking medicines such as aspirin and ibuprofen. These medicines can thin your blood. Do not take these medicines before your procedure if your health care provider instructs you not to.  Taking new dietary  supplements or medicines. Do not take these during the week before your procedure unless your health care provider approves them.  If you are told to take a medicine or to continue taking a medicine on the day of the procedure, take the medicine with sips of water. General instructions    Ask if you will be going home the same day, the following day, or after a longer hospital stay.  Plan to have someone take you home.  Plan to have someone stay with you for the first 24 hours after you leave the hospital or clinic.  For 3-6 weeks before the procedure, try not to use any tobacco products, such as cigarettes, chewing tobacco, and e-cigarettes.  You may brush your teeth on the morning of the procedure, but make sure to spit out the toothpaste. What happens during the procedure?  You will be given anesthetics through a mask and through an IV tube in one of your veins.  You may receive medicine to help you relax (sedative).  As soon as you are asleep, a breathing tube may be used to help you breathe.  An anesthesia specialist will stay with you throughout the procedure. He or she will help keep you comfortable and safe by continuing to give you medicines and adjusting the amount of medicine that you get. He or she will also watch your blood pressure, pulse, and oxygen levels to make sure that the anesthetics do not cause any problems.  If a breathing tube was used to help you breathe, it will be removed before you wake up. The procedure may vary among health care providers and hospitals. What happens after the procedure?  You will wake up, often slowly, after the procedure is complete, usually in a recovery area.  Your blood pressure, heart rate, breathing rate, and blood oxygen level will be monitored until the medicines you were given have worn off.  You may be given medicine to help you calm down if you feel anxious or agitated.  If you will be going home the same day, your health  care provider may check to make sure you can stand, drink, and urinate.  Your health care providers will treat your pain and side effects before you go home.  Do not drive for 24 hours if you received a sedative.  You may:  Feel nauseous and vomit.  Have a sore throat.  Have mental slowness.  Feel cold or shivery.  Feel sleepy.  Feel tired.  Feel sore or achy, even in parts of your body where you did not have surgery. This information is not intended to replace advice given to you by your health care provider. Make sure you discuss any questions you have with your health care provider. Document Released: 04/19/2007 Document Revised: 06/23/2015 Document Reviewed: 12/25/2014 Elsevier Interactive Patient Education  2017 [place] Anesthesia, Adult, Care After These instructions provide you with information about caring for yourself after your procedure. Your health care provider may also give you more specific instructions. Your treatment has been planned according to current medical practices, but problems sometimes occur. Call your health care provider if you have any problems or questions after your procedure. What can I expect after the procedure? After the procedure, it is common to have:  Vomiting.  A sore throat.  Mental slowness. It is common to feel:  Nauseous.  Cold or shivery.  Sleepy.  Tired.  Sore or achy, even in parts of your body where you did not have surgery. Follow these instructions at home: For at least 24 hours after the procedure:   Do not:  Participate in activities where you could fall or become injured.  Drive.  Use heavy machinery.  Drink alcohol.  Take sleeping pills or medicines that cause drowsiness.  Make important decisions or sign legal documents.  Take care of children on your own.  Rest. Eating and drinking   If you  vomit, drink water, juice, or soup when you can drink without vomiting.  Drink enough  fluid to keep your urine clear or pale yellow.  Make sure you have little or no nausea before eating solid foods.  Follow the diet recommended by your health care provider. General instructions   Have a responsible adult stay with you until you are awake and alert.  Return to your normal activities as told by your health care provider. Ask your health care provider what activities are safe for you.  Take over-the-counter and prescription medicines only as told by your health care provider.  If you smoke, do not smoke without supervision.  Keep all follow-up visits as told by your health care provider. This is important. Contact a health care provider if:  You continue to have nausea or vomiting at home, and medicines are not helpful.  You cannot drink fluids or start eating again.  You cannot urinate after 8-12 hours.  You develop a skin rash.  You have fever.  You have increasing redness at the site of your procedure. Get help right away if:  You have difficulty breathing.  You have chest pain.  You have unexpected bleeding.  You feel that you are having a life-threatening or urgent problem. This information is not intended to replace advice given to you by your health care provider. Make sure you discuss any questions you have with your health care provider. Document Released: 04/18/2000 Document Revised: 06/15/2015 Document Reviewed: 12/25/2014 Elsevier Interactive Patient Education  2017 [employer].

## 2016-03-29 ENCOUNTER — Encounter (HOSPITAL_COMMUNITY): Payer: Self-pay

## 2016-03-29 ENCOUNTER — Ambulatory Visit (HOSPITAL_COMMUNITY)
Admission: RE | Admit: 2016-03-29 | Discharge: 2016-03-29 | Disposition: A | Discharge status: Home / Self Care | Payer: BLUE CROSS/BLUE SHIELD | Source: Ambulatory Visit | Attending: General Surgery | Admitting: General Surgery

## 2016-03-29 ENCOUNTER — Encounter (HOSPITAL_COMMUNITY)
Admission: RE | Admit: 2016-03-29 | Discharge: 2016-03-29 | Disposition: A | Discharge status: Home / Self Care | Payer: BLUE CROSS/BLUE SHIELD | Source: Ambulatory Visit | Attending: General Surgery | Admitting: General Surgery

## 2016-03-29 DIAGNOSIS — Z9889 Other specified postprocedural states: Secondary | ICD-10-CM | POA: Diagnosis not present

## 2016-03-29 DIAGNOSIS — Z85118 Personal history of other malignant neoplasm of bronchus and lung: Secondary | ICD-10-CM | POA: Insufficient documentation

## 2016-03-29 DIAGNOSIS — I10 Essential (primary) hypertension: Secondary | ICD-10-CM | POA: Diagnosis present

## 2016-03-29 HISTORY — DX: Gastro-esophageal reflux disease without esophagitis: K21.9

## 2016-03-29 HISTORY — DX: Emphysema, unspecified: J43.9

## 2016-03-29 HISTORY — DX: Unspecified osteoarthritis, unspecified site: M19.90

## 2016-03-29 LAB — CBC WITH DIFFERENTIAL/PLATELET
Basophils Absolute: 0.1 10*3/uL (ref 0.0–0.1)
Basophils Relative: 2 %
EOS ABS: 0.5 10*3/uL (ref 0.0–0.7)
EOS PCT: 6 %
HCT: 40.4 % (ref 36.0–46.0)
HEMOGLOBIN: 13.2 g/dL (ref 12.0–15.0)
LYMPHS ABS: 1.9 10*3/uL (ref 0.7–4.0)
LYMPHS PCT: 23 %
MCH: 28.8 pg (ref 26.0–34.0)
MCHC: 32.7 g/dL (ref 30.0–36.0)
MCV: 88 fL (ref 78.0–100.0)
MONOS PCT: 7 %
Monocytes Absolute: 0.5 10*3/uL (ref 0.1–1.0)
Neutro Abs: 5.1 10*3/uL (ref 1.7–7.7)
Neutrophils Relative %: 63 %
PLATELETS: 365 10*3/uL (ref 150–400)
RBC: 4.59 MIL/uL (ref 3.87–5.11)
RDW: 12.8 % (ref 11.5–15.5)
WBC: 8.1 10*3/uL (ref 4.0–10.5)

## 2016-03-29 LAB — COMPREHENSIVE METABOLIC PANEL
ALK PHOS: 123 U/L (ref 38–126)
ALT: 23 U/L (ref 14–54)
AST: 25 U/L (ref 15–41)
Albumin: 4.1 g/dL (ref 3.5–5.0)
Anion gap: 6 (ref 5–15)
BUN: 23 mg/dL — ABNORMAL HIGH (ref 6–20)
CALCIUM: 9.3 mg/dL (ref 8.9–10.3)
CO2: 28 mmol/L (ref 22–32)
CREATININE: 0.82 mg/dL (ref 0.44–1.00)
Chloride: 104 mmol/L (ref 101–111)
GFR calc Af Amer: >60 mL/min (ref >60)
Glucose, Bld: 91 mg/dL (ref 65–99)
Potassium: 4 mmol/L (ref 3.5–5.1)
SODIUM: 138 mmol/L (ref 135–145)
Total Bilirubin: 0.5 mg/dL (ref 0.3–1.2)
Total Protein: 7.8 g/dL (ref 6.5–8.1)

## 2016-03-31 MED ORDER — CIPROFLOXACIN IN D5W 400 MG/200ML IV SOLN
400.0000 mg | INTRAVENOUS | Status: AC
  Administered 2016-04-01: 400 mg via INTRAVENOUS

## 2016-04-01 ENCOUNTER — Ambulatory Visit (HOSPITAL_COMMUNITY): Payer: BLUE CROSS/BLUE SHIELD | Admitting: Anesthesiology

## 2016-04-01 ENCOUNTER — Ambulatory Visit (HOSPITAL_COMMUNITY)
Admission: RE | Admit: 2016-04-01 | Discharge: 2016-04-01 | Disposition: A | Discharge status: Home / Self Care | Payer: BLUE CROSS/BLUE SHIELD | Source: Ambulatory Visit | Attending: General Surgery | Admitting: General Surgery

## 2016-04-01 ENCOUNTER — Encounter (HOSPITAL_COMMUNITY)
Admission: RE | Disposition: A | Discharge status: Home / Self Care | Payer: Self-pay | Source: Ambulatory Visit | Attending: General Surgery

## 2016-04-01 ENCOUNTER — Encounter (HOSPITAL_COMMUNITY): Payer: Self-pay | Admitting: *Deleted

## 2016-04-01 DIAGNOSIS — J439 Emphysema, unspecified: Secondary | ICD-10-CM | POA: Insufficient documentation

## 2016-04-01 DIAGNOSIS — Z85118 Personal history of other malignant neoplasm of bronchus and lung: Secondary | ICD-10-CM | POA: Diagnosis not present

## 2016-04-01 DIAGNOSIS — M5030 Other cervical disc degeneration, unspecified cervical region: Secondary | ICD-10-CM

## 2016-04-01 DIAGNOSIS — K219 Gastro-esophageal reflux disease without esophagitis: Secondary | ICD-10-CM | POA: Diagnosis not present

## 2016-04-01 DIAGNOSIS — Z87891 Personal history of nicotine dependence: Secondary | ICD-10-CM | POA: Diagnosis not present

## 2016-04-01 DIAGNOSIS — F419 Anxiety disorder, unspecified: Secondary | ICD-10-CM | POA: Insufficient documentation

## 2016-04-01 DIAGNOSIS — M797 Fibromyalgia: Secondary | ICD-10-CM | POA: Diagnosis not present

## 2016-04-01 DIAGNOSIS — I10 Essential (primary) hypertension: Secondary | ICD-10-CM | POA: Insufficient documentation

## 2016-04-01 DIAGNOSIS — Z79890 Hormone replacement therapy (postmenopausal): Secondary | ICD-10-CM | POA: Insufficient documentation

## 2016-04-01 DIAGNOSIS — K8010 Calculus of gallbladder with chronic cholecystitis without obstruction: Secondary | ICD-10-CM | POA: Diagnosis not present

## 2016-04-01 DIAGNOSIS — Z79899 Other long term (current) drug therapy: Secondary | ICD-10-CM | POA: Insufficient documentation

## 2016-04-01 DIAGNOSIS — K8020 Calculus of gallbladder without cholecystitis without obstruction: Secondary | ICD-10-CM

## 2016-04-01 HISTORY — PX: CHOLECYSTECTOMY: SHX55

## 2016-04-01 SURGERY — LAPAROSCOPIC CHOLECYSTECTOMY
Anesthesia: General | Site: Abdomen

## 2016-04-01 MED ORDER — LACTATED RINGERS IV SOLN
INTRAVENOUS | Status: DC
  Administered 2016-04-01: 08:00:00 via INTRAVENOUS

## 2016-04-01 MED ORDER — DEXTROSE 5 % IV SOLN
INTRAVENOUS | Status: DC | PRN
  Administered 2016-04-01: 08:00:00 via INTRAVENOUS

## 2016-04-01 MED ORDER — KETOROLAC TROMETHAMINE 30 MG/ML IJ SOLN
30.0000 mg | Freq: Once | INTRAMUSCULAR | Status: AC
  Administered 2016-04-01: 30 mg via INTRAVENOUS
  Filled 2016-04-01: qty 1

## 2016-04-01 MED ORDER — POVIDONE-IODINE 10 % OINT PACKET
TOPICAL_OINTMENT | CUTANEOUS | Status: DC | PRN
  Administered 2016-04-01: 1 via TOPICAL

## 2016-04-01 MED ORDER — CHLORHEXIDINE GLUCONATE CLOTH 2 % EX PADS: 6.0000 | MEDICATED_PAD | Freq: Once | CUTANEOUS | Status: DC

## 2016-04-01 MED ORDER — LIDOCAINE HCL 1 % IJ SOLN
INTRAMUSCULAR | Status: DC | PRN
  Administered 2016-04-01: 25 mg via INTRADERMAL

## 2016-04-01 MED ORDER — PROPOFOL 10 MG/ML IV BOLUS
INTRAVENOUS | Status: AC
  Filled 2016-04-01: qty 20

## 2016-04-01 MED ORDER — FENTANYL CITRATE (PF) 250 MCG/5ML IJ SOLN
INTRAMUSCULAR | Status: AC
  Filled 2016-04-01: qty 5

## 2016-04-01 MED ORDER — GLYCOPYRROLATE 0.2 MG/ML IJ SOLN
INTRAMUSCULAR | Status: AC
  Filled 2016-04-01: qty 3

## 2016-04-01 MED ORDER — BUPIVACAINE HCL (PF) 0.5 % IJ SOLN
INTRAMUSCULAR | Status: AC
  Filled 2016-04-01: qty 30

## 2016-04-01 MED ORDER — CIPROFLOXACIN IN D5W 400 MG/200ML IV SOLN
INTRAVENOUS | Status: AC
Start: 2016-04-01 — End: ?
  Filled 2016-04-01: qty 200

## 2016-04-01 MED ORDER — PROPOFOL 10 MG/ML IV BOLUS
INTRAVENOUS | Status: DC | PRN
  Administered 2016-04-01: 120 mg via INTRAVENOUS

## 2016-04-01 MED ORDER — EPHEDRINE SULFATE 50 MG/ML IJ SOLN
INTRAMUSCULAR | Status: AC
  Filled 2016-04-01: qty 1

## 2016-04-01 MED ORDER — LIDOCAINE HCL (PF) 1 % IJ SOLN
INTRAMUSCULAR | Status: AC
  Filled 2016-04-01: qty 5

## 2016-04-01 MED ORDER — MIDAZOLAM HCL 2 MG/2ML IJ SOLN
1.0000 mg | INTRAMUSCULAR | Status: AC
  Administered 2016-04-01: 2 mg via INTRAVENOUS

## 2016-04-01 MED ORDER — 0.9 % SODIUM CHLORIDE (POUR BTL) OPTIME
TOPICAL | Status: DC | PRN
  Administered 2016-04-01: 1000 mL

## 2016-04-01 MED ORDER — SUCCINYLCHOLINE CHLORIDE 20 MG/ML IJ SOLN
INTRAMUSCULAR | Status: AC
  Filled 2016-04-01: qty 1

## 2016-04-01 MED ORDER — MIDAZOLAM HCL 2 MG/2ML IJ SOLN
INTRAMUSCULAR | Status: AC
  Filled 2016-04-01: qty 2

## 2016-04-01 MED ORDER — POVIDONE-IODINE 10 % EX OINT
TOPICAL_OINTMENT | CUTANEOUS | Status: AC
  Filled 2016-04-01: qty 1

## 2016-04-01 MED ORDER — BUPIVACAINE HCL (PF) 0.5 % IJ SOLN
INTRAMUSCULAR | Status: DC | PRN
  Administered 2016-04-01: 9 mL

## 2016-04-01 MED ORDER — ROCURONIUM BROMIDE 50 MG/5ML IV SOLN
INTRAVENOUS | Status: AC
  Filled 2016-04-01: qty 1

## 2016-04-01 MED ORDER — NEOSTIGMINE METHYLSULFATE 10 MG/10ML IV SOLN
INTRAVENOUS | Status: DC | PRN
  Administered 2016-04-01: 1 mg via INTRAVENOUS
  Administered 2016-04-01 (×2): 2 mg via INTRAVENOUS

## 2016-04-01 MED ORDER — SUCCINYLCHOLINE CHLORIDE 20 MG/ML IJ SOLN
INTRAMUSCULAR | Status: DC | PRN
  Administered 2016-04-01: 120 mg via INTRAVENOUS

## 2016-04-01 MED ORDER — FENTANYL CITRATE (PF) 100 MCG/2ML IJ SOLN
INTRAMUSCULAR | Status: AC
  Filled 2016-04-01: qty 2

## 2016-04-01 MED ORDER — FENTANYL CITRATE (PF) 100 MCG/2ML IJ SOLN
INTRAMUSCULAR | Status: DC | PRN
  Administered 2016-04-01 (×2): 50 ug via INTRAVENOUS
  Administered 2016-04-01: 100 ug via INTRAVENOUS
  Administered 2016-04-01: 50 ug via INTRAVENOUS

## 2016-04-01 MED ORDER — MIDAZOLAM HCL 5 MG/5ML IJ SOLN
INTRAMUSCULAR | Status: DC | PRN
  Administered 2016-04-01: 2 mg via INTRAVENOUS

## 2016-04-01 MED ORDER — ROCURONIUM BROMIDE 100 MG/10ML IV SOLN
INTRAVENOUS | Status: DC | PRN
  Administered 2016-04-01: 20 mg via INTRAVENOUS
  Administered 2016-04-01: 5 mg via INTRAVENOUS

## 2016-04-01 MED ORDER — SODIUM CHLORIDE 0.9 % IJ SOLN
INTRAMUSCULAR | Status: AC
  Filled 2016-04-01: qty 10

## 2016-04-01 MED ORDER — GLYCOPYRROLATE 0.2 MG/ML IJ SOLN
INTRAMUSCULAR | Status: DC | PRN
  Administered 2016-04-01: 0.6 mg via INTRAVENOUS

## 2016-04-01 MED ORDER — ONDANSETRON HCL 4 MG/2ML IJ SOLN
INTRAMUSCULAR | Status: AC
  Filled 2016-04-01: qty 2

## 2016-04-01 MED ORDER — ONDANSETRON HCL 4 MG/2ML IJ SOLN
4.0000 mg | Freq: Once | INTRAMUSCULAR | Status: AC
  Administered 2016-04-01: 4 mg via INTRAVENOUS

## 2016-04-01 MED ORDER — HYDROMORPHONE HCL 1 MG/ML IJ SOLN
0.2500 mg | INTRAMUSCULAR | Status: DC | PRN
  Administered 2016-04-01: 0.5 mg via INTRAVENOUS
  Filled 2016-04-01: qty 0.5

## 2016-04-01 MED ORDER — HEMOSTATIC AGENTS (NO CHARGE) OPTIME
TOPICAL | Status: DC | PRN
  Administered 2016-04-01: 1 via TOPICAL

## 2016-04-01 SURGICAL SUPPLY — 39 items
APPLIER CLIP LAPSCP 10X32 DD (CLIP) ×2 IMPLANT
BAG HAMPER (MISCELLANEOUS) ×2 IMPLANT
CHLORAPREP W/TINT 26ML (MISCELLANEOUS) ×2 IMPLANT
CLOTH BEACON ORANGE TIMEOUT ST (SAFETY) ×2 IMPLANT
COVER LIGHT HANDLE STERIS (MISCELLANEOUS) ×4 IMPLANT
DECANTER SPIKE VIAL GLASS SM (MISCELLANEOUS) ×2 IMPLANT
ELECT REM PT RETURN 9FT ADLT (ELECTROSURGICAL) ×2
ELECTRODE REM PT RTRN 9FT ADLT (ELECTROSURGICAL) ×1 IMPLANT
FILTER SMOKE EVAC LAPAROSHD (FILTER) ×2 IMPLANT
FORMALIN 10 PREFIL 120ML (MISCELLANEOUS) ×2 IMPLANT
GLOVE BIOGEL PI IND STRL 7.0 (GLOVE) ×2 IMPLANT
GLOVE BIOGEL PI INDICATOR 7.0 (GLOVE) ×2
GLOVE SS BIOGEL STRL SZ 6.5 (GLOVE) ×2 IMPLANT
GLOVE SUPERSENSE BIOGEL SZ 6.5 (GLOVE) ×2
GLOVE SURG SS PI 7.5 STRL IVOR (GLOVE) ×2 IMPLANT
GOWN STRL REUS W/ TWL XL LVL3 (GOWN DISPOSABLE) ×1 IMPLANT
GOWN STRL REUS W/TWL LRG LVL3 (GOWN DISPOSABLE) ×4 IMPLANT
GOWN STRL REUS W/TWL XL LVL3 (GOWN DISPOSABLE) ×1
HEMOSTAT SNOW SURGICEL 2X4 (HEMOSTASIS) ×2 IMPLANT
INST SET LAPROSCOPIC AP (KITS) ×2 IMPLANT
KIT ROOM TURNOVER APOR (KITS) ×2 IMPLANT
MANIFOLD NEPTUNE II (INSTRUMENTS) ×2 IMPLANT
NEEDLE INSUFFLATION 14GA 120MM (NEEDLE) ×2 IMPLANT
NS IRRIG 1000ML POUR BTL (IV SOLUTION) ×2 IMPLANT
PACK LAP CHOLE LZT030E (CUSTOM PROCEDURE TRAY) ×2 IMPLANT
PAD ARMBOARD 7.5X6 YLW CONV (MISCELLANEOUS) ×2 IMPLANT
POUCH SPECIMEN RETRIEVAL 10MM (ENDOMECHANICALS) ×2 IMPLANT
SET BASIN LINEN APH (SET/KITS/TRAYS/PACK) ×2 IMPLANT
SLEEVE ENDOPATH XCEL 5M (ENDOMECHANICALS) ×2 IMPLANT
SPONGE GAUZE 2X2 8PLY STRL LF (GAUZE/BANDAGES/DRESSINGS) ×2 IMPLANT
STAPLER VISISTAT (STAPLE) ×2 IMPLANT
SUT VICRYL 0 UR6 27IN ABS (SUTURE) ×2 IMPLANT
TAPE CLOTH SURG 4X10 WHT LF (GAUZE/BANDAGES/DRESSINGS) ×2 IMPLANT
TROCAR ENDO BLADELESS 11MM (ENDOMECHANICALS) ×2 IMPLANT
TROCAR XCEL NON-BLD 5MMX100MML (ENDOMECHANICALS) ×2 IMPLANT
TROCAR XCEL UNIV SLVE 11M 100M (ENDOMECHANICALS) ×2 IMPLANT
TUBE CONNECTING 12X1/4 (SUCTIONS) ×2 IMPLANT
TUBING INSUFFLATION (TUBING) ×2 IMPLANT
WARMER LAPAROSCOPE (MISCELLANEOUS) ×2 IMPLANT

## 2016-04-01 NOTE — Anesthesia Procedure Notes (Signed)
Procedure Name: Intubation Date/Time: 04/01/2016 8:31 AM Performed by: Charmaine Downs Pre-anesthesia Checklist: Patient identified, Patient being monitored, Timeout performed, Emergency Drugs available and Suction available Patient Re-evaluated:Patient Re-evaluated prior to inductionOxygen Delivery Method: Circle System Utilized Preoxygenation: Pre-oxygenation with 100% oxygen Intubation Type: IV induction, Rapid sequence and Cricoid Pressure applied Ventilation: Mask ventilation without difficulty Laryngoscope Size: Mac and 3 Grade View: Grade I Tube type: Oral Tube size: 7.0 mm Number of attempts: 1 Airway Equipment and Method: stylet Placement Confirmation: ETT inserted through vocal cords under direct vision,  positive ETCO2 and breath sounds checked- equal and bilateral Secured at: 23 cm Tube secured with: Tape Dental Injury: Teeth and Oropharynx as per pre-operative assessment

## 2016-04-01 NOTE — Interval H&P Note (Signed)
History and Physical Interval Note:  04/01/2016 8:16 AM  [name]  has presented today for surgery, with the diagnosis of cholelithiasis  The various methods of treatment have been discussed with the patient and family. After consideration of risks, benefits and other options for treatment, the patient has consented to  Procedure(s): LAPAROSCOPIC CHOLECYSTECTOMY (N/A) as a surgical intervention .  The patient's history has been reviewed, patient examined, no change in status, stable for surgery.  I have reviewed the patient's chart and labs.  Questions were answered to the patient's satisfaction.     Aviva Signs

## 2016-04-01 NOTE — Anesthesia Postprocedure Evaluation (Signed)
Anesthesia Post Note  Patient: [name]  Procedure(s) Performed: Procedure(s) (LRB): LAPAROSCOPIC CHOLECYSTECTOMY (N/A)  Patient location during evaluation: PACU Anesthesia Type: General Level of consciousness: awake, oriented and patient cooperative Pain management: pain level controlled Vital Signs Assessment: post-procedure vital signs reviewed and stable Respiratory status: spontaneous breathing, nonlabored ventilation and respiratory function stable Cardiovascular status: blood pressure returned to baseline Postop Assessment: no signs of nausea or vomiting Anesthetic complications: no     Last Vitals:  Vitals:   04/01/16 0816  BP: 136/60  Pulse: 88  Temp: 36.9 C    Last Pain:  Vitals:   04/01/16 0816  TempSrc: Oral  PainSc: 0-No pain                 Tristian Sickinger J

## 2016-04-01 NOTE — Discharge Instructions (Signed)
Laparoscopic Cholecystectomy, Care After °This sheet gives you information about how to care for yourself after your procedure. Your health care provider may also give you more specific instructions. If you have problems or questions, contact your health care provider. °What can I expect after the procedure? °After the procedure, it is common to have: °· Pain at your incision sites. You will be given medicines to control this pain. °· Mild nausea or vomiting. °· Bloating and possible shoulder pain from the air-like gas that was used during the procedure. °Follow these instructions at home: °Incision care  ° °· Follow instructions from your health care provider about how to take care of your incisions. Make sure you: °¨ Wash your hands with soap and water before you change your bandage (dressing). If soap and water are not available, use hand sanitizer. °¨ Change your dressing as told by your health care provider. °¨ Leave stitches (sutures), skin glue, or adhesive strips in place. These skin closures may need to be in place for 2 weeks or longer. If adhesive strip edges start to loosen and curl up, you may trim the loose edges. Do not remove adhesive strips completely unless your health care provider tells you to do that. °· Do not take baths, swim, or use a hot tub until your health care provider approves. Ask your health care provider if you can take showers. You may only be allowed to take sponge baths for bathing. °· Check your incision area every day for signs of infection. Check for: °¨ More redness, swelling, or pain. °¨ More fluid or blood. °¨ Warmth. °¨ Pus or a bad smell. °Activity  °· Do not drive or use heavy machinery while taking prescription pain medicine. °· Do not lift anything that is heavier than 10 lb (4.5 kg) until your health care provider approves. °· Do not play contact sports until your health care provider approves. °· Do not drive for 24 hours if you were given a medicine to help you relax  (sedative). °· Rest as needed. Do not return to work or school until your health care provider approves. °General instructions  °· Take over-the-counter and prescription medicines only as told by your health care provider. °· To prevent or treat constipation while you are taking prescription pain medicine, your health care provider may recommend that you: °¨ Drink enough fluid to keep your urine clear or pale yellow. °¨ Take over-the-counter or prescription medicines. °¨ Eat foods that are high in fiber, such as fresh fruits and vegetables, whole grains, and beans. °¨ Limit foods that are high in fat and processed sugars, such as fried and sweet foods. °Contact a health care provider if: °· You develop a rash. °· You have more redness, swelling, or pain around your incisions. °· You have more fluid or blood coming from your incisions. °· Your incisions feel warm to the touch. °· You have pus or a bad smell coming from your incisions. °· You have a fever. °· One or more of your incisions breaks open. °Get help right away if: °· You have trouble breathing. °· You have chest pain. °· You have increasing pain in your shoulders. °· You faint or feel dizzy when you stand. °· You have severe pain in your abdomen. °· You have nausea or vomiting that lasts for more than one day. °· You have leg pain. °This information is not intended to replace advice given to you by your health care provider. Make sure you discuss any   questions you have with your health care provider. °Document Released: 01/10/2005 Document Revised: 08/01/2015 Document Reviewed: 06/29/2015 °Elsevier Interactive Patient Education © 2017 [employer] ° °

## 2016-04-01 NOTE — Op Note (Signed)
Patient:  [name]  DOB:  1960-03-04  MRN:  [id number]   Preop Diagnosis:  Biliary colic, cholelithiasis  Postop Diagnosis:  Same  Procedure:  Laparoscopic cholecystectomy  Surgeon:  Aviva Signs, M.D.  Anes:  Gen. endotracheal  Indications:  Patient is a 56 year old white female who presents with biliary colic secondary to cholelithiasis. The risks and benefits of the procedure including bleeding, infection, hepatobiliary injury, and the possibility of an open procedure were fully explained to the patient, who gave informed consent.  Procedure note:  The patient was placed in the supine position. After induction of general endotracheal anesthesia, the abdomen was prepped and draped using usual sterile technique with DuraPrep. Surgical site confirmation was performed.  A supraumbilical incision was made down to the fascia. A Veress needle was introduced into the abdominal cavity and confirmation of placement was done using the saline drop test. The abdomen was then insufflated to 16 mmHg pressure. An 11 mm trocar was introduced into the abdominal cavity under direct visualization without difficulty. The patient was placed in reverse Trendelenburg position and an additional 11 mm trocar was placed the epigastric region and 5 mm trochars were placed the right upper quadrant and right flank regions. Liver was inspected and noted to be within normal limits. The gallbladder was retracted in a dynamic fashion in order to provide a critical view of the triangle of Calot. The cystic duct was first identified. Its juncture to the infundibulum was fully identified. Endoclips were placed proximally and distally on cystic duct, and the cystic duct was divided. This was likewise done to the cystic artery. The gallbladder was then freed away from the gallbladder fossa using Bovie electrocautery. The gallbladder was delivered through the epigastric trocar site using an Endo Catch bag. The gallbladder  fossa was inspected and no abnormal bleeding or bile leakage was noted. Surgicel was placed in the gallbladder fossa. All fluid and air were then evacuated from the abdominal cavity prior to the removal of the trochars.  All wounds were irrigated with normal saline. All wounds were injected with 0.5% Sensorcaine. The supraumbilical fascia was reapproximated using an 0 Vicryl interrupted suture. All skin incisions were closed using staples. Betadine ointment and dry sterile dressings were applied.  All tape and needle counts were correct at the end of the procedure. The patient was extubated in the operating room and transferred to PACU in stable condition.  Complications:  None  EBL:  Minimal  Specimen:  Gallbladder

## 2016-04-01 NOTE — Anesthesia Preprocedure Evaluation (Signed)
Anesthesia Evaluation  Patient identified by MRN, date of birth, ID band Patient awake    Reviewed: Allergy & Precautions, H&P , NPO status , Patient's Chart, lab work & pertinent test results  History of Anesthesia Complications (+) DIFFICULT AIRWAY  Airway Mallampati: II  TM Distance: >3 FB Neck ROM: full    Dental  (+) Teeth Intact   Pulmonary COPD (emphysema, lung cancer), former smoker,  History of lung cancer with tumor resection   breath sounds clear to auscultation       Cardiovascular Exercise Tolerance: Good hypertension, Pt. on medications  Rhythm:regular     Neuro/Psych Anxiety    GI/Hepatic GERD  Medicated,  Endo/Other    Renal/GU      Musculoskeletal  (+) Arthritis , Fibromyalgia -  Abdominal   Peds  Hematology   Anesthesia Other Findings   Reproductive/Obstetrics negative OB ROS                             Anesthesia Physical Anesthesia Plan  ASA: IV  Anesthesia Plan: General   Post-op Pain Management:    Induction: Intravenous, Rapid sequence and Cricoid pressure planned  Airway Management Planned: Oral ETT and Video Laryngoscope Planned  Additional Equipment:   Intra-op Plan:   Post-operative Plan: Extubation in OR  Informed Consent: I have reviewed the patients History and Physical, chart, labs and discussed the procedure including the risks, benefits and alternatives for the proposed anesthesia with the patient or authorized representative who has indicated his/her understanding and acceptance.     Plan Discussed with:   Anesthesia Plan Comments:         Anesthesia Quick Evaluation

## 2016-04-01 NOTE — Transfer of Care (Signed)
Immediate Anesthesia Transfer of Care Note  Patient: [name]  Procedure(s) Performed: Procedure(s): LAPAROSCOPIC CHOLECYSTECTOMY (N/A)  Patient Location: PACU  Anesthesia Type:General  Level of Consciousness: awake and patient cooperative  Airway & Oxygen Therapy: Patient Spontanous Breathing and Patient connected to face mask oxygen  Post-op Assessment: Report given to RN, Post -op Vital signs reviewed and stable and Patient moving all extremities  Post vital signs: Reviewed and stable  Last Vitals:  Vitals:   04/01/16 0816  BP: 136/60  Pulse: 88  Temp: 36.9 C    Last Pain:  Vitals:   04/01/16 0816  TempSrc: Oral  PainSc: 0-No pain      Patients Stated Pain Goal: 6 ([id number])  Complications: No apparent anesthesia complications

## 2016-04-04 ENCOUNTER — Encounter (HOSPITAL_COMMUNITY): Payer: Self-pay | Admitting: General Surgery

## 2016-04-12 ENCOUNTER — Encounter: Payer: Self-pay | Admitting: General Surgery

## 2016-04-12 ENCOUNTER — Ambulatory Visit (INDEPENDENT_AMBULATORY_CARE_PROVIDER_SITE_OTHER): Payer: Self-pay | Admitting: General Surgery

## 2016-04-12 ENCOUNTER — Ambulatory Visit: Payer: BLUE CROSS/BLUE SHIELD | Admitting: General Surgery

## 2016-04-12 VITALS — BP 153/94 | HR 70 | Temp 96.8°F | Resp 18 | Ht 64.0 in | Wt 140.0 lb

## 2016-04-12 DIAGNOSIS — Z09 Encounter for follow-up examination after completed treatment for conditions other than malignant neoplasm: Secondary | ICD-10-CM

## 2016-04-12 NOTE — Progress Notes (Signed)
Subjective:     [name]  Status post laparoscopic cholecystectomy. Doing well. No complaints. Objective:    BP (!) 153/94   Pulse 70   Temp (!) 96.8 F (36 C)   Resp 18   Ht '5\' 4"'$  (1.626 m)   Wt 140 lb (63.5 kg)   BMI 24.03 kg/m   Abdomen soft, incisions healing well. Staples removed, Steri-Strips applied.    Assessment:    Doing well postoperatively.    Plan:  Gradually resume normal activity.

## 2016-04-20 ENCOUNTER — Other Ambulatory Visit: Payer: Self-pay | Admitting: Physician Assistant

## 2016-05-24 ENCOUNTER — Encounter: Payer: Self-pay | Admitting: Physician Assistant

## 2016-05-24 ENCOUNTER — Ambulatory Visit (INDEPENDENT_AMBULATORY_CARE_PROVIDER_SITE_OTHER): Payer: BLUE CROSS/BLUE SHIELD | Admitting: Physician Assistant

## 2016-05-24 VITALS — BP 131/86 | HR 85 | Temp 97.9°F | Ht 64.0 in | Wt 140.2 lb

## 2016-05-24 DIAGNOSIS — Z0000 Encounter for general adult medical examination without abnormal findings: Secondary | ICD-10-CM

## 2016-05-24 MED ORDER — IBUPROFEN 800 MG PO TABS: 800.0000 mg | ORAL_TABLET | Freq: Three times a day (TID) | ORAL | 11 refills | Status: DC | PRN

## 2016-05-24 NOTE — Patient Instructions (Signed)
Health Maintenance, Female Adopting a healthy lifestyle and getting preventive care can go a long way to promote health and wellness. Talk with your health care provider about what schedule of regular examinations is right for you. This is a good chance for you to check in with your provider about disease prevention and staying healthy. In between checkups, there are plenty of things you can do on your own. Experts have done a lot of research about which lifestyle changes and preventive measures are most likely to keep you healthy. Ask your health care provider for more information. Weight and diet Eat a healthy diet  Be sure to include plenty of vegetables, fruits, low-fat dairy products, and lean protein.  Do not eat a lot of foods high in solid fats, added sugars, or salt.  Get regular exercise. This is one of the most important things you can do for your health.  Most adults should exercise for at least 150 minutes each week. The exercise should increase your heart rate and make you sweat (moderate-intensity exercise).  Most adults should also do strengthening exercises at least twice a week. This is in addition to the moderate-intensity exercise. Maintain a healthy weight  Body mass index (BMI) is a measurement that can be used to identify possible weight problems. It estimates body fat based on height and weight. Your health care provider can help determine your BMI and help you achieve or maintain a healthy weight.  For females 57 years of age and older:  A BMI below 18.5 is considered underweight.  A BMI of 18.5 to 24.9 is normal.  A BMI of 25 to 29.9 is considered overweight.  A BMI of 30 and above is considered obese. Watch levels of cholesterol and blood lipids  You should start having your blood tested for lipids and cholesterol at 57 years of age, then have this test every 5 years.  You may need to have your cholesterol levels checked more often if:  Your lipid or  cholesterol levels are high.  You are older than 57 years of age.  You are at high risk for heart disease. Cancer screening Lung Cancer  Lung cancer screening is recommended for adults 64-42 years old who are at high risk for lung cancer because of a history of smoking.  A yearly low-dose CT scan of the lungs is recommended for people who:  Currently smoke.  Have quit within the past 15 years.  Have at least a 30-pack-year history of smoking. A pack year is smoking an average of one pack of cigarettes a day for 1 year.  Yearly screening should continue until it has been 15 years since you quit.  Yearly screening should stop if you develop a health problem that would prevent you from having lung cancer treatment. Breast Cancer  Practice breast self-awareness. This means understanding how your breasts normally appear and feel.  It also means doing regular breast self-exams. Let your health care provider know about any changes, no matter how small.  If you are in your 20s or 30s, you should have a clinical breast exam (CBE) by a health care provider every 1-3 years as part of a regular health exam.  If you are 34 or older, have a CBE every year. Also consider having a breast X-ray (mammogram) every year.  If you have a family history of breast cancer, talk to your health care provider about genetic screening.  If you are at high risk for breast cancer, talk  to your health care provider about having an MRI and a mammogram every year.  Breast cancer gene (BRCA) assessment is recommended for women who have family members with BRCA-related cancers. BRCA-related cancers include:  Breast.  Ovarian.  Tubal.  Peritoneal cancers.  Results of the assessment will determine the need for genetic counseling and BRCA1 and BRCA2 testing. Cervical Cancer  Your health care provider may recommend that you be screened regularly for cancer of the pelvic organs (ovaries, uterus, and vagina).  This screening involves a pelvic examination, including checking for microscopic changes to the surface of your cervix (Pap test). You may be encouraged to have this screening done every 3 years, beginning at age 24.  For women ages 66-65, health care providers may recommend pelvic exams and Pap testing every 3 years, or they may recommend the Pap and pelvic exam, combined with testing for human papilloma virus (HPV), every 5 years. Some types of HPV increase your risk of cervical cancer. Testing for HPV may also be done on women of any age with unclear Pap test results.  Other health care providers may not recommend any screening for nonpregnant women who are considered low risk for pelvic cancer and who do not have symptoms. Ask your health care provider if a screening pelvic exam is right for you.  If you have had past treatment for cervical cancer or a condition that could lead to cancer, you need Pap tests and screening for cancer for at least 20 years after your treatment. If Pap tests have been discontinued, your risk factors (such as having a new sexual partner) need to be reassessed to determine if screening should resume. Some women have medical problems that increase the chance of getting cervical cancer. In these cases, your health care provider may recommend more frequent screening and Pap tests. Colorectal Cancer  This type of cancer can be detected and often prevented.  Routine colorectal cancer screening usually begins at 57 years of age and continues through 57 years of age.  Your health care provider may recommend screening at an earlier age if you have risk factors for colon cancer.  Your health care provider may also recommend using home test kits to check for hidden blood in the stool.  A small camera at the end of a tube can be used to examine your colon directly (sigmoidoscopy or colonoscopy). This is done to check for the earliest forms of colorectal cancer.  Routine  screening usually begins at age 41.  Direct examination of the colon should be repeated every 5-10 years through 57 years of age. However, you may need to be screened more often if early forms of precancerous polyps or small growths are found. Skin Cancer  Check your skin from head to toe regularly.  Tell your health care provider about any new moles or changes in moles, especially if there is a change in a mole's shape or color.  Also tell your health care provider if you have a mole that is larger than the size of a pencil eraser.  Always use sunscreen. Apply sunscreen liberally and repeatedly throughout the day.  Protect yourself by wearing long sleeves, pants, a wide-brimmed hat, and sunglasses whenever you are outside. Heart disease, diabetes, and high blood pressure  High blood pressure causes heart disease and increases the risk of stroke. High blood pressure is more likely to develop in:  People who have blood pressure in the high end of the normal range (130-139/85-89 mm Hg).  People who are overweight or obese.  People who are African American.  If you are 59-24 years of age, have your blood pressure checked every 3-5 years. If you are 34 years of age or older, have your blood pressure checked every year. You should have your blood pressure measured twice-once when you are at a hospital or clinic, and once when you are not at a hospital or clinic. Record the average of the two measurements. To check your blood pressure when you are not at a hospital or clinic, you can use:  An automated blood pressure machine at a pharmacy.  A home blood pressure monitor.  If you are between 29 years and 60 years old, ask your health care provider if you should take aspirin to prevent strokes.  Have regular diabetes screenings. This involves taking a blood sample to check your fasting blood sugar level.  If you are at a normal weight and have a low risk for diabetes, have this test once  every three years after 57 years of age.  If you are overweight and have a high risk for diabetes, consider being tested at a younger age or more often. Preventing infection Hepatitis B  If you have a higher risk for hepatitis B, you should be screened for this virus. You are considered at high risk for hepatitis B if:  You were born in a country where hepatitis B is common. Ask your health care provider which countries are considered high risk.  Your parents were born in a high-risk country, and you have not been immunized against hepatitis B (hepatitis B vaccine).  You have HIV or AIDS.  You use needles to inject street drugs.  You live with someone who has hepatitis B.  You have had sex with someone who has hepatitis B.  You get hemodialysis treatment.  You take certain medicines for conditions, including cancer, organ transplantation, and autoimmune conditions. Hepatitis C  Blood testing is recommended for:  Everyone born from [birth year] through 1966.  Anyone with known risk factors for hepatitis C. Sexually transmitted infections (STIs)  You should be screened for sexually transmitted infections (STIs) including gonorrhea and chlamydia if:  You are sexually active and are younger than 57 years of age.  You are older than 57 years of age and your health care provider tells you that you are at risk for this type of infection.  Your sexual activity has changed since you were last screened and you are at an increased risk for chlamydia or gonorrhea. Ask your health care provider if you are at risk.  If you do not have HIV, but are at risk, it may be recommended that you take a prescription medicine daily to prevent HIV infection. This is called pre-exposure prophylaxis (PrEP). You are considered at risk if:  You are sexually active and do not regularly use condoms or know the HIV status of your partner(s).  You take drugs by injection.  You are sexually active with a partner  who has HIV. Talk with your health care provider about whether you are at high risk of being infected with HIV. If you choose to begin PrEP, you should first be tested for HIV. You should then be tested every 3 months for as long as you are taking PrEP. Pregnancy  If you are premenopausal and you may become pregnant, ask your health care provider about preconception counseling.  If you may become pregnant, take 400 to 800 micrograms (mcg) of folic acid  every day.  If you want to prevent pregnancy, talk to your health care provider about birth control (contraception). Osteoporosis and menopause  Osteoporosis is a disease in which the bones lose minerals and strength with aging. This can result in serious bone fractures. Your risk for osteoporosis can be identified using a bone density scan.  If you are 4 years of age or older, or if you are at risk for osteoporosis and fractures, ask your health care provider if you should be screened.  Ask your health care provider whether you should take a calcium or vitamin D supplement to lower your risk for osteoporosis.  Menopause may have certain physical symptoms and risks.  Hormone replacement therapy may reduce some of these symptoms and risks. Talk to your health care provider about whether hormone replacement therapy is right for you. Follow these instructions at home:  Schedule regular health, dental, and eye exams.  Stay current with your immunizations.  Do not use any tobacco products including cigarettes, chewing tobacco, or electronic cigarettes.  If you are pregnant, do not drink alcohol.  If you are breastfeeding, limit how much and how often you drink alcohol.  Limit alcohol intake to no more than 1 drink per day for nonpregnant women. One drink equals 12 ounces of beer, 5 ounces of wine, or 1 ounces of hard liquor.  Do not use street drugs.  Do not share needles.  Ask your health care provider for help if you need support  or information about quitting drugs.  Tell your health care provider if you often feel depressed.  Tell your health care provider if you have ever been abused or do not feel safe at home. This information is not intended to replace advice given to you by your health care provider. Make sure you discuss any questions you have with your health care provider. Document Released: 07/26/2010 Document Revised: 06/18/2015 Document Reviewed: 10/14/2014 Elsevier Interactive Patient Education  2017 [employer].

## 2016-05-26 NOTE — Progress Notes (Signed)
BP 131/86   Pulse 85   Temp 97.9 F (36.6 C) (Oral)   Ht '5\' 4"'$  (1.626 m)   Wt 140 lb 3.2 oz (63.6 kg)   BMI 24.07 kg/m    Subjective:    Patient ID: [name], female    DOB: 1960-08-05, 56 y.o.   MRN: [id number]  HPI: [name] is a 56 y.o. female presenting on 05/24/2016 for Annual Exam  This patient comes in for annual well physical examination. All medications are reviewed today. There are no reports of any problems with the medications. All of the medical conditions are reviewed and updated.  Lab work is reviewed and will be ordered as medically necessary. There are no new problems reported with today's visit.  Patient reports doing well overall.   Past Medical History:  Diagnosis Date  . Anxiety   . Arthritis   . Constipation 02/17/2015  . Emphysema lung ([place])   . Fibromyalgia   . GERD (gastroesophageal reflux disease)   . Hypertension   . 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 Menopausal vaginal dryness 02/17/2015   Relevant past medical, surgical, family and social history reviewed and updated as indicated. Interim medical history since our last visit reviewed. Allergies and medications reviewed and updated. DATA REVIEWED: CHART IN EPIC  Social History   Social History  . Marital status: Married    Spouse name: N/A  . Number of children: N/A  . Years of education: N/A   Occupational History  . Not on file.   Social History Main Topics  . Smoking status: Former Smoker    Packs/day: 1.50    Types: Cigarettes    Quit date: 02/04/2010  . Smokeless tobacco: Never Used     Comment: Quit x 5 years  . Alcohol use No  . Drug use: No  . Sexual activity: Yes    Birth control/ protection: Post-menopausal   Other Topics Concern  . Not on file   Social History Narrative  . No narrative on file    Past Surgical History:  Procedure Laterality Date  . CHOLECYSTECTOMY N/A 04/01/2016   Procedure: LAPAROSCOPIC CHOLECYSTECTOMY;  Surgeon: Aviva Signs, MD;  Location: [facility];  Service: General;  Laterality: N/A;  . COLONOSCOPY  12/2010   in [place], per patient, normal, next colonoscopy 10 years  . ESOPHAGOGASTRODUODENOSCOPY N/A 10/16/2013   JJO:ACZYSAYT ring s/p dilation/2 cm HH  . 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 Hiatal hernia  . LAPAROSCOPIC APPENDECTOMY N/A 06/21/2013   Procedure: APPENDECTOMY LAPAROSCOPIC;  Surgeon: Jamesetta So, MD;  Location: [facility];  Service: General;  Laterality: N/A;  Venia Minks DILATION N/A 10/16/2013   Procedure: Keturah Shavers;  Surgeon: Daneil Dolin, MD;  [facility];  Service: Endoscopy;  Laterality: N/A;  . PORTACATH PLACEMENT  [id number]   Rt   IJ  Port-A-Cath  dr.burney  . STERNAL WIRES REMOVAL  01/23/2012   Procedure: STERNAL WIRES REMOVAL;  Surgeon: Grace Isaac, MD;  Location: [facility];  Service: Thoracic;  Laterality: N/A;  . THORACOTOMY  [id number]   transsmanubrial anterolaterl thoracotomy with chest wall resection includinf the first rib and left  upper lobectomy for Pancoast turmor  . TUBAL LIGATION      Family History  Problem Relation Age of Onset  . Cancer Mother     lung  . Hypertension Father   . Stroke Father   . Hypertension Sister   .  Heart disease Sister   . Cancer Brother     pancreatic  . Colon cancer Neg Hx     Review of Systems  Constitutional: Negative.  Negative for activity change, fatigue and fever.  HENT: Negative.   Eyes: Negative.   Respiratory: Negative.  Negative for cough.   Cardiovascular: Negative.  Negative for chest pain.  Gastrointestinal: Negative.  Negative for abdominal pain.  Endocrine: Negative.   Genitourinary: Negative.  Negative for dysuria.  Musculoskeletal: Negative.   Skin: Negative.   Neurological: Negative.     Allergies as of 05/24/2016   No Known Allergies     Medication List       Accurate as of 05/24/16 11:59 PM. Always use your  most recent med list.          albuterol 108 (90 Base) MCG/ACT inhaler Commonly known as:  PROVENTIL HFA;VENTOLIN HFA Inhale 2 puffs into the lungs every 6 (six) hours as needed for wheezing or shortness of breath.   conjugated estrogens vaginal cream Commonly known as:  PREMARIN Place 1 Applicatorful vaginally daily.   ibuprofen 800 MG tablet Commonly known as:  ADVIL,MOTRIN Take 1 tablet (800 mg total) by mouth every 8 (eight) hours as needed.   lisinopril 40 MG tablet Commonly known as:  PRINIVIL,ZESTRIL TAKE 1 TABLET DAILY   LORazepam 1 MG tablet Commonly known as:  ATIVAN TAKE 1 TABLET AT BEDTIME AS NEEDED          Objective:    BP 131/86   Pulse 85   Temp 97.9 F (36.6 C) (Oral)   Ht '5\' 4"'$  (1.626 m)   Wt 140 lb 3.2 oz (63.6 kg)   BMI 24.07 kg/m   No Known Allergies  Wt Readings from Last 3 Encounters:  05/24/16 140 lb 3.2 oz (63.6 kg)  04/12/16 140 lb (63.5 kg)  03/29/16 142 lb (64.4 kg)    Physical Exam  Constitutional: She is oriented to person, place, and time. She appears well-developed and well-nourished.  HENT:  Head: Normocephalic and atraumatic.  Right Ear: Tympanic membrane, external ear and ear canal normal.  Left Ear: Tympanic membrane, external ear and ear canal normal.  Nose: Nose normal. No rhinorrhea.  Mouth/Throat: Oropharynx is clear and moist and mucous membranes are normal. No oropharyngeal exudate or posterior oropharyngeal erythema.  Eyes: Conjunctivae and EOM are normal. Pupils are equal, round, and reactive to light.  Neck: Normal range of motion. Neck supple.  Cardiovascular: Normal rate, regular rhythm, normal heart sounds and intact distal pulses.   Pulmonary/Chest: Effort normal and breath sounds normal.  Abdominal: Soft. Bowel sounds are normal.  Neurological: She is alert and oriented to person, place, and time. She has normal reflexes.  Skin: Skin is warm and dry. No rash noted.  Psychiatric: She has a normal mood and  affect. Her behavior is normal. Judgment and thought content normal.  Nursing note and vitals reviewed.   Results for orders placed or performed during the hospital encounter of 03/29/16  CBC WITH DIFFERENTIAL  Result Value Ref Range   WBC 8.1 4.0 - 10.5 K/uL   RBC 4.59 3.87 - 5.11 MIL/uL   Hemoglobin 13.2 12.0 - 15.0 g/dL   HCT 40.4 36.0 - 46.0 %   MCV 88.0 78.0 - 100.0 fL   MCH 28.8 26.0 - 34.0 pg   MCHC 32.7 30.0 - 36.0 g/dL   RDW 12.8 11.5 - 15.5 %   Platelets 365 150 - 400 K/uL   Neutrophils Relative %  63 %   Neutro Abs 5.1 1.7 - 7.7 K/uL   Lymphocytes Relative 23 %   Lymphs Abs 1.9 0.7 - 4.0 K/uL   Monocytes Relative 7 %   Monocytes Absolute 0.5 0.1 - 1.0 K/uL   Eosinophils Relative 6 %   Eosinophils Absolute 0.5 0.0 - 0.7 K/uL   Basophils Relative 2 %   Basophils Absolute 0.1 0.0 - 0.1 K/uL  Comprehensive metabolic panel  Result Value Ref Range   Sodium 138 135 - 145 mmol/L   Potassium 4.0 3.5 - 5.1 mmol/L   Chloride 104 101 - 111 mmol/L   CO2 28 22 - 32 mmol/L   Glucose, Bld 91 65 - 99 mg/dL   BUN 23 (H) 6 - 20 mg/dL   Creatinine, Ser 0.82 0.44 - 1.00 mg/dL   Calcium 9.3 8.9 - 10.3 mg/dL   Total Protein 7.8 6.5 - 8.1 g/dL   Albumin 4.1 3.5 - 5.0 g/dL   AST 25 15 - 41 U/L   ALT 23 14 - 54 U/L   Alkaline Phosphatase 123 38 - 126 U/L   Total Bilirubin 0.5 0.3 - 1.2 mg/dL   GFR calc non Af Amer >60 >60 mL/min   GFR calc Af Amer >60 >60 mL/min   Anion gap 6 5 - 15      Assessment & Plan:   1. Well adult exam - CBC with Differential/Platelet; Future - CMP14+EGFR; Future - Lipid panel; Future   Current Outpatient Prescriptions:  .  albuterol (PROVENTIL HFA;VENTOLIN HFA) 108 (90 Base) MCG/ACT inhaler, Inhale 2 puffs into the lungs every 6 (six) hours as needed for wheezing or shortness of breath., Disp: 1 Inhaler, Rfl: 1 .  conjugated estrogens (PREMARIN) vaginal cream, Place 1 Applicatorful vaginally daily. (Patient taking differently: Place 1 Applicatorful  vaginally at bedtime as needed (for vaginal dryness.). ), Disp: 42.5 g, Rfl: 12 .  lisinopril (PRINIVIL,ZESTRIL) 40 MG tablet, TAKE 1 TABLET DAILY, Disp: 30 tablet, Rfl: 11 .  LORazepam (ATIVAN) 1 MG tablet, TAKE 1 TABLET AT BEDTIME AS NEEDED (Patient taking differently: TAKE 1 TABLET AT BEDTIME AS NEEDED FOR SLEEP), Disp: 60 tablet, Rfl: 2 .  ibuprofen (ADVIL,MOTRIN) 800 MG tablet, Take 1 tablet (800 mg total) by mouth every 8 (eight) hours as needed., Disp: 90 tablet, Rfl: 11  Continue all other maintenance medications as listed above.  Follow up plan: Return in about 1 year (around 05/24/2017), or if symptoms worsen or fail to improve, for annual.  Educational handout given for health maintenance  Terald Sleeper PA-C [facility] [address]  [place], [place] [address] [phone]   05/26/2016, 6:03 PM

## 2016-06-24 ENCOUNTER — Telehealth: Payer: Self-pay | Admitting: Physician Assistant

## 2016-06-27 NOTE — Telephone Encounter (Signed)
Scheduled

## 2016-08-15 ENCOUNTER — Encounter: Payer: Self-pay | Admitting: Family Medicine

## 2016-08-15 ENCOUNTER — Ambulatory Visit (INDEPENDENT_AMBULATORY_CARE_PROVIDER_SITE_OTHER): Payer: BLUE CROSS/BLUE SHIELD | Admitting: Family Medicine

## 2016-08-15 VITALS — BP 154/99 | HR 98 | Temp 97.2°F | Ht 64.0 in | Wt 136.2 lb

## 2016-08-15 DIAGNOSIS — M5432 Sciatica, left side: Secondary | ICD-10-CM

## 2016-08-15 DIAGNOSIS — T63441A Toxic effect of venom of bees, accidental (unintentional), initial encounter: Secondary | ICD-10-CM | POA: Diagnosis not present

## 2016-08-15 MED ORDER — METHYLPREDNISOLONE ACETATE 80 MG/ML IJ SUSP
80.0000 mg | Freq: Once | INTRAMUSCULAR | Status: AC
  Administered 2016-08-15: 80 mg via INTRAMUSCULAR

## 2016-08-15 MED ORDER — PREDNISONE 20 MG PO TABS: ORAL_TABLET | ORAL | 0 refills | Status: DC

## 2016-08-15 NOTE — Progress Notes (Signed)
   HPI  Patient presents today here back pain.  Patient explains that she's had back pain starting Friday morning, about 3 days. Patient has chronic back pain, she is seen by neurosurgery in [place], Dr. Carloyn Manner. He has been discussing surgery with her for a few months. She's been slowly getting the left lower leg weakness.  Patient states that she has no acute injury this episode. She describes left-sided low back pain that is severe rating down the posterior and lateral left leg. She has left leg weakness which is stable. Denies bowel or bladder dysfunction. 800 mg ibuprofen minimal help. He and ice with minimal help.  Patient used to take hydrocodone, however these are causing nausea and upset stomach recently, she still has a few pills at home. She declines tramadol as well.  PMH: Smoking status noted ROS: Per HPI  Objective: BP (!) 154/99   Pulse 98   Temp (!) 97.2 F (36.2 C) (Oral)   Ht 5\' 4"  (1.626 m)   Wt 136 lb 3.2 oz (61.8 kg)   BMI 23.38 kg/m  Gen: NAD, alert, cooperative with exam HEENT: NCAT CV: RRR, good S1/S2, no murmur Resp: CTABL, no wheezes, non-labored Ext: No edema, warm Neuro: Alert and oriented,Strength 4/5 compared to 5/5 in the left lower extremity versus right lower extremity respectively MSK Tenderness to palpation of left-sided paraspinal muscles, no midline tenderness Negative modified straight leg raise 1+ symmetric patellar tendon reflexes bilaterally   Assessment and plan:  # Sciatica Patient requesting cortisone injection, this is been given Follow-up with additional steroids if needed starting tomorrow. Prednisone 40 mg 5 days. Offered tramadol, she declines She will follow-up with Dr. Carloyn Manner if symptoms do not improve as expected. No red flags except for developing left lower extremity weakness which has been going on for a few months and has been discussed with neurosurgery who is recommending surgery. This is stable at this time.  Bee  sting Patient also has also has erythema and swelling of the left distal foot. She states that she was stung by a bee 2 days ago on the bottom of the foot. Should be helped by steroids   Meds ordered this encounter  Medications  . methylPREDNISolone acetate (DEPO-MEDROL) injection 80 mg  . predniSONE (DELTASONE) 20 MG tablet    Sig: 2 po at sametime daily for 5 days- start tomorrow    Dispense:  10 tablet    Refill:  0    Laroy Apple, MD [facility] 08/15/2016, 9:18 AM

## 2016-08-15 NOTE — Patient Instructions (Signed)
Great to meet you!   Sciatica Sciatica is pain, numbness, weakness, or tingling along your sciatic nerve. The sciatic nerve starts in the lower back and goes down the back of each leg. Sciatica happens when this nerve is pinched or has pressure put on it. Sciatica usually goes away on its own or with treatment. Sometimes, sciatica may keep coming back (recur). Follow these instructions at home: Medicines  Take over-the-counter and prescription medicines only as told by your doctor.  Do not drive or use heavy machinery while taking prescription pain medicine. Managing pain  If directed, put ice on the affected area. ? Put ice in a plastic bag. ? Place a towel between your skin and the bag. ? Leave the ice on for 20 minutes, 2-3 times a day.  After icing, apply heat to the affected area before you exercise or as often as told by your doctor. Use the heat source that your doctor tells you to use, such as a moist heat pack or a heating pad. ? Place a towel between your skin and the heat source. ? Leave the heat on for 20-30 minutes. ? Remove the heat if your skin turns bright red. This is especially important if you are unable to feel pain, heat, or cold. You may have a greater risk of getting burned. Activity  Return to your normal activities as told by your doctor. Ask your doctor what activities are safe for you. ? Avoid activities that make your sciatica worse.  Take short rests during the day. Rest in a lying or standing position. This is usually better than sitting to rest. ? When you rest for a long time, do some physical activity or stretching between periods of rest. ? Avoid sitting for a long time without moving. Get up and move around at least one time each hour.  Exercise and stretch regularly, as told by your doctor.  Do not lift anything that is heavier than 10 lb (4.5 kg) while you have symptoms of sciatica. ? Avoid lifting heavy things even when you do not have  symptoms. ? Avoid lifting heavy things over and over.  When you lift objects, always lift in a way that is safe for your body. To do this, you should: ? Bend your knees. ? Keep the object close to your body. ? Avoid twisting. General instructions  Use good posture. ? Avoid leaning forward when you are sitting. ? Avoid hunching over when you are standing.  Stay at a healthy weight.  Wear comfortable shoes that support your feet. Avoid wearing high heels.  Avoid sleeping on a mattress that is too soft or too hard. You might have less pain if you sleep on a mattress that is firm enough to support your back.  Keep all follow-up visits as told by your doctor. This is important. Contact a doctor if:  You have pain that: ? Wakes you up when you are sleeping. ? Gets worse when you lie down. ? Is worse than the pain you have had in the past. ? Lasts longer than 4 weeks.  You lose weight for without trying. Get help right away if:  You cannot control when you pee (urinate) or poop (have a bowel movement).  You have weakness in any of these areas and it gets worse. ? Lower back. ? Lower belly (pelvis). ? Butt (buttocks). ? Legs.  You have redness or swelling of your back.  You have a burning feeling when you pee. This  information is not intended to replace advice given to you by your health care provider. Make sure you discuss any questions you have with your health care provider. Document Released: 10/20/2007 Document Revised: 06/18/2015 Document Reviewed: 09/19/2014 Elsevier Interactive Patient Education  [employer].

## 2016-08-23 ENCOUNTER — Telehealth: Payer: Self-pay | Admitting: Physician Assistant

## 2016-08-23 ENCOUNTER — Ambulatory Visit (INDEPENDENT_AMBULATORY_CARE_PROVIDER_SITE_OTHER): Payer: BLUE CROSS/BLUE SHIELD | Admitting: Physician Assistant

## 2016-08-23 ENCOUNTER — Encounter: Payer: Self-pay | Admitting: Physician Assistant

## 2016-08-23 VITALS — BP 140/82 | HR 59 | Temp 98.4°F | Ht 64.0 in | Wt 138.0 lb

## 2016-08-23 DIAGNOSIS — M5137 Other intervertebral disc degeneration, lumbosacral region: Secondary | ICD-10-CM

## 2016-08-23 MED ORDER — METHYLPREDNISOLONE ACETATE 80 MG/ML IJ SUSP
80.0000 mg | Freq: Once | INTRAMUSCULAR | Status: AC
  Administered 2016-08-23: 80 mg via INTRAMUSCULAR

## 2016-08-23 MED ORDER — PREDNISONE 10 MG (48) PO TBPK: ORAL_TABLET | ORAL | 0 refills | Status: DC

## 2016-08-23 NOTE — Progress Notes (Signed)
BP 140/82   Pulse (!) 59   Temp 98.4 F (36.9 C) (Oral)   Ht 5\' 4"  (1.626 m)   Wt 138 lb (62.6 kg)   BMI 23.69 kg/m    Subjective:    Patient ID: [name], female    DOB: [birth year], 56 y.o.   MRN: [id number]  HPI: [name] is a 56 y.o. female presenting on 08/23/2016 for Back Pain  This patient comes in for periodic recheck on medications and conditions including Degenerative disc disease with nerve impingement. She has an appointment with her neurosurgeon in a couple more weeks. Overall her back is bothering her day in and day out. They have discussed that she needed surgery. She feels that she is can have to do it at this time. The back is very bad and down her leg. She gets weaker as the day goes on. The anti-inflammatories do help. She took half of a Vicodin last time he felt sick. She rather not take narcotic pain medicine if at all possible..   All medications are reviewed today. There are no reports of any problems with the medications. All of the medical conditions are reviewed and updated.  Lab work is reviewed and will be ordered as medically necessary. There are no new problems reported with today's visit.   Relevant past medical, surgical, family and social history reviewed and updated as indicated. Allergies and medications reviewed and updated.  Past Medical History:  Diagnosis Date  . Anxiety   . Arthritis   . Constipation 02/17/2015  . Emphysema lung ([place])   . Fibromyalgia   . GERD (gastroesophageal reflux disease)   . Hypertension   . 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 Menopausal vaginal dryness 02/17/2015    Past Surgical History:  Procedure Laterality Date  . CHOLECYSTECTOMY N/A 04/01/2016   Procedure: LAPAROSCOPIC CHOLECYSTECTOMY;  Surgeon: Aviva Signs, MD;  Location: [facility];  Service: General;  Laterality: N/A;  . COLONOSCOPY  12/2010   in [place], per patient, normal, next colonoscopy 10 years  . ESOPHAGOGASTRODUODENOSCOPY N/A  10/16/2013   FXJ:OITGPQDI ring s/p dilation/2 cm HH  . 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 Hiatal hernia  . LAPAROSCOPIC APPENDECTOMY N/A 06/21/2013   Procedure: APPENDECTOMY LAPAROSCOPIC;  Surgeon: Jamesetta So, MD;  Location: [facility];  Service: General;  Laterality: N/A;  Venia Minks DILATION N/A 10/16/2013   Procedure: Keturah Shavers;  Surgeon: Daneil Dolin, MD;  [facility];  Service: Endoscopy;  Laterality: N/A;  . PORTACATH PLACEMENT  [id number]   Rt   IJ  Port-A-Cath  dr.burney  . STERNAL WIRES REMOVAL  01/23/2012   Procedure: STERNAL WIRES REMOVAL;  Surgeon: Grace Isaac, MD;  Location: [facility];  Service: Thoracic;  Laterality: N/A;  . THORACOTOMY  [id number]   transsmanubrial anterolaterl thoracotomy with chest wall resection includinf the first rib and left  upper lobectomy for Pancoast turmor  . TUBAL LIGATION      Review of Systems  Constitutional: Negative.   HENT: Negative.   Eyes: Negative.   Respiratory: Negative.   Gastrointestinal: Negative.   Genitourinary: Negative.   Musculoskeletal: Positive for arthralgias, back pain and gait problem.    Allergies as of 08/23/2016   No Known Allergies     Medication List       Accurate as of 08/23/16 11:01 AM. Always use your most recent med list.  albuterol 108 (90 Base) MCG/ACT inhaler Commonly known as:  PROVENTIL HFA;VENTOLIN HFA Inhale 2 puffs into the lungs every 6 (six) hours as needed for wheezing or shortness of breath.   conjugated estrogens vaginal cream Commonly known as:  PREMARIN Place 1 Applicatorful vaginally daily.   ibuprofen 800 MG tablet Commonly known as:  ADVIL,MOTRIN Take 1 tablet (800 mg total) by mouth every 8 (eight) hours as needed.   lisinopril 40 MG tablet Commonly known as:  PRINIVIL,ZESTRIL TAKE 1 TABLET DAILY   LORazepam 1 MG tablet Commonly known as:  ATIVAN TAKE 1 TABLET AT BEDTIME  AS NEEDED   predniSONE 10 MG (48) Tbpk tablet Commonly known as:  STERAPRED UNI-PAK 48 TAB 12 day pack, take as directed          Objective:    BP 140/82   Pulse (!) 59   Temp 98.4 F (36.9 C) (Oral)   Ht 5\' 4"  (1.626 m)   Wt 138 lb (62.6 kg)   BMI 23.69 kg/m   No Known Allergies  Physical Exam  Constitutional: She is oriented to person, place, and time. She appears well-developed and well-nourished.  HENT:  Head: Normocephalic and atraumatic.  Eyes: Pupils are equal, round, and reactive to light. Conjunctivae and EOM are normal.  Cardiovascular: Normal rate, regular rhythm, normal heart sounds and intact distal pulses.   Pulmonary/Chest: Effort normal and breath sounds normal.  Abdominal: Soft. Bowel sounds are normal.  Musculoskeletal:       Lumbar back: She exhibits decreased range of motion, tenderness, pain and spasm.  Neurological: She is alert and oriented to person, place, and time. She exhibits abnormal muscle tone.  Reflex Scores:      Patellar reflexes are 1+ on the right side and 1+ on the left side. Skin: Skin is warm and dry. No rash noted.  Psychiatric: She has a normal mood and affect. Her behavior is normal. Judgment and thought content normal.        Assessment & Plan:   1. Degeneration of lumbar or lumbosacral intervertebral disc - predniSONE (STERAPRED UNI-PAK 48 TAB) 10 MG (48) TBPK tablet; 12 day pack, take as directed  Dispense: 48 tablet; Refill: 0 - methylPREDNISolone acetate (DEPO-MEDROL) injection 80 mg; Inject 1 mL (80 mg total) into the muscle once.   Current Outpatient Prescriptions:  .  albuterol (PROVENTIL HFA;VENTOLIN HFA) 108 (90 Base) MCG/ACT inhaler, Inhale 2 puffs into the lungs every 6 (six) hours as needed for wheezing or shortness of breath., Disp: 1 Inhaler, Rfl: 1 .  conjugated estrogens (PREMARIN) vaginal cream, Place 1 Applicatorful vaginally daily. (Patient taking differently: Place 1 Applicatorful vaginally at bedtime as  needed (for vaginal dryness.). ), Disp: 42.5 g, Rfl: 12 .  ibuprofen (ADVIL,MOTRIN) 800 MG tablet, Take 1 tablet (800 mg total) by mouth every 8 (eight) hours as needed., Disp: 90 tablet, Rfl: 11 .  lisinopril (PRINIVIL,ZESTRIL) 40 MG tablet, TAKE 1 TABLET DAILY, Disp: 30 tablet, Rfl: 11 .  LORazepam (ATIVAN) 1 MG tablet, TAKE 1 TABLET AT BEDTIME AS NEEDED (Patient taking differently: TAKE 1 TABLET AT BEDTIME AS NEEDED FOR SLEEP), Disp: 60 tablet, Rfl: 2 .  predniSONE (STERAPRED UNI-PAK 48 TAB) 10 MG (48) TBPK tablet, 12 day pack, take as directed, Disp: 48 tablet, Rfl: 0  Continue all other maintenance medications as listed above.  Follow up plan: Return if symptoms worsen or fail to improve.  Educational handout given for contact dermatitis  Terald Sleeper PA-C [facility] [place], [place] [address] [phone]   08/23/2016, 11:01 AM

## 2016-08-23 NOTE — Telephone Encounter (Signed)
Patient coming at 9:10.

## 2016-08-23 NOTE — Patient Instructions (Signed)
In a few days you may receive a survey in the mail or online from [employer] regarding your visit with us today. Please take a moment to fill this out. Your feedback is very important to our whole office. It can help us better understand your needs as well as improve your experience and satisfaction. Thank you for taking your time to complete it. We care about you.  Yesika Rispoli, PA-C  

## 2016-09-06 ENCOUNTER — Telehealth: Payer: Self-pay | Admitting: Physician Assistant

## 2016-09-06 DIAGNOSIS — M5030 Other cervical disc degeneration, unspecified cervical region: Secondary | ICD-10-CM

## 2016-09-06 DIAGNOSIS — M5010 Cervical disc disorder with radiculopathy, unspecified cervical region: Secondary | ICD-10-CM

## 2016-09-06 NOTE — Telephone Encounter (Signed)
Patient states that she went to Dr. Carloyn Manner yesterday and he looked at her MRI that was 72.57 years old she states that he told her he is going to order another MRI. He told her that he didn't know what else he can do for her. She wants to know what you recommend.

## 2016-09-06 NOTE — Telephone Encounter (Signed)
Patient aware of recommendation. Patient has MRI appointment on Wednesday at [facility].

## 2016-09-14 DIAGNOSIS — M4302 Spondylolysis, cervical region: Secondary | ICD-10-CM | POA: Insufficient documentation

## 2016-09-14 DIAGNOSIS — M47816 Spondylosis without myelopathy or radiculopathy, lumbar region: Secondary | ICD-10-CM | POA: Insufficient documentation

## 2016-09-27 ENCOUNTER — Telehealth: Payer: Self-pay | Admitting: Physician Assistant

## 2016-09-27 ENCOUNTER — Encounter: Payer: Self-pay | Admitting: Physician Assistant

## 2016-09-27 ENCOUNTER — Ambulatory Visit (INDEPENDENT_AMBULATORY_CARE_PROVIDER_SITE_OTHER): Payer: BLUE CROSS/BLUE SHIELD | Admitting: Physician Assistant

## 2016-09-27 VITALS — BP 137/86 | HR 69 | Temp 97.7°F | Ht 64.0 in | Wt 135.4 lb

## 2016-09-27 DIAGNOSIS — M5126 Other intervertebral disc displacement, lumbar region: Secondary | ICD-10-CM | POA: Diagnosis not present

## 2016-09-27 DIAGNOSIS — G629 Polyneuropathy, unspecified: Secondary | ICD-10-CM | POA: Diagnosis not present

## 2016-09-27 MED ORDER — CYCLOBENZAPRINE HCL 10 MG PO TABS: 10.0000 mg | ORAL_TABLET | Freq: Three times a day (TID) | ORAL | 2 refills | Status: DC | PRN

## 2016-09-27 MED ORDER — NAPROXEN 500 MG PO TABS: 500.0000 mg | ORAL_TABLET | Freq: Two times a day (BID) | ORAL | 5 refills | Status: DC

## 2016-09-27 MED ORDER — METHYLPREDNISOLONE ACETATE 80 MG/ML IJ SUSP
80.0000 mg | Freq: Once | INTRAMUSCULAR | Status: AC
  Administered 2016-09-27: 80 mg via INTRAMUSCULAR

## 2016-09-27 NOTE — Telephone Encounter (Signed)
Noted  

## 2016-09-27 NOTE — Telephone Encounter (Signed)
Patient aware rx sent to pharmacy.  

## 2016-09-27 NOTE — Patient Instructions (Signed)
In a few days you may receive a survey in the mail or online from [employer] regarding your visit with us today. Please take a moment to fill this out. Your feedback is very important to our whole office. It can help us better understand your needs as well as improve your experience and satisfaction. Thank you for taking your time to complete it. We care about you.  Yoshi Mancillas, PA-C  

## 2016-09-27 NOTE — Telephone Encounter (Signed)
meds were sent to wrong pharmacy. They need to be sent to [facility]. Please change this and call pt right away she said if she has to she will come and pick them up. She gets off at 4pm. Please advise.

## 2016-09-27 NOTE — Progress Notes (Signed)
BP 137/86   Pulse 69   Temp 97.7 F (36.5 C) (Oral)   Ht 5\' 4"  (1.626 m)   Wt 135 lb 6.4 oz (61.4 kg)   BMI 23.24 kg/m    Subjective:    Patient ID: [name], female    DOB: 1960/10/06, 56 y.o.   MRN: [id number]  HPI: [name] is a 56 y.o. female presenting on 09/27/2016 for Back Pain  This is a known patient of Dr. Carloyn Manner, neurosurgeon. She has had surgery on her cervical spine. She has had known degenerative disc in her lumbar spine. In the past few months she has had a great increase in low back pain. It's days in the low mid back and radiates to the left buttock. There is no radiation down the leg. She states she has been weak at times in the left leg. The most recent MRI showed a lumbar disc herniated centrally and compressing on the spinal cord. They are talking about future surgery, she is trying to put it off as long as possible. Injections will not help this condition.  Relevant past medical, surgical, family and social history reviewed and updated as indicated. Allergies and medications reviewed and updated.  Past Medical History:  Diagnosis Date  . Anxiety   . Arthritis   . Constipation 02/17/2015  . Emphysema lung ([place])   . Fibromyalgia   . GERD (gastroesophageal reflux disease)   . Hypertension   . 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 Menopausal vaginal dryness 02/17/2015    Past Surgical History:  Procedure Laterality Date  . CHOLECYSTECTOMY N/A 04/01/2016   Procedure: LAPAROSCOPIC CHOLECYSTECTOMY;  Surgeon: Aviva Signs, MD;  Location: [facility];  Service: General;  Laterality: N/A;  . COLONOSCOPY  12/2010   in [place], per patient, normal, next colonoscopy 10 years  . ESOPHAGOGASTRODUODENOSCOPY N/A 10/16/2013   URK:YHCWCBJS ring s/p dilation/2 cm HH  . 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 Hiatal hernia  . LAPAROSCOPIC APPENDECTOMY N/A 06/21/2013   Procedure: APPENDECTOMY  LAPAROSCOPIC;  Surgeon: Jamesetta So, MD;  Location: [facility];  Service: General;  Laterality: N/A;  Venia Minks DILATION N/A 10/16/2013   Procedure: Keturah Shavers;  Surgeon: Daneil Dolin, MD;  Location: [facility];  Service: Endoscopy;  Laterality: N/A;  . PORTACATH PLACEMENT  [id number]   Rt   IJ  Port-A-Cath  dr.burney  . STERNAL WIRES REMOVAL  01/23/2012   Procedure: STERNAL WIRES REMOVAL;  Surgeon: Grace Isaac, MD;  Location: [facility];  Service: Thoracic;  Laterality: N/A;  . THORACOTOMY  [id number]   transsmanubrial anterolaterl thoracotomy with chest wall resection includinf the first rib and left  upper lobectomy for Pancoast turmor  . TUBAL LIGATION      Review of Systems  Constitutional: Negative.  Negative for activity change, fatigue and fever.  HENT: Negative.   Eyes: Negative.   Respiratory: Negative.  Negative for cough.   Cardiovascular: Negative.  Negative for chest pain.  Gastrointestinal: Negative.  Negative for abdominal pain.  Endocrine: Negative.   Genitourinary: Negative.  Negative for dysuria.  Musculoskeletal: Positive for back pain, gait problem and myalgias.  Skin: Negative.     Allergies as of 09/27/2016   No Known Allergies     Medication List       Accurate as of 09/27/16 12:33 PM. Always use your most recent med list.          albuterol  108 (90 Base) MCG/ACT inhaler Commonly known as:  PROVENTIL HFA;VENTOLIN HFA Inhale 2 puffs into the lungs every 6 (six) hours as needed for wheezing or shortness of breath.   conjugated estrogens vaginal cream Commonly known as:  PREMARIN Place 1 Applicatorful vaginally daily.   cyclobenzaprine 10 MG tablet Commonly known as:  FLEXERIL Take 1 tablet (10 mg total) by mouth 3 (three) times daily as needed for muscle spasms.   lisinopril 40 MG tablet Commonly known as:  PRINIVIL,ZESTRIL TAKE 1 TABLET DAILY   LORazepam 1 MG tablet Commonly known as:  ATIVAN TAKE 1 TABLET AT BEDTIME AS NEEDED     naproxen 500 MG tablet Commonly known as:  NAPROSYN Take 1 tablet (500 mg total) by mouth 2 (two) times daily with a meal.            Discharge Care Instructions        Start     Ordered   09/27/16 1215  METHYLPREDNISOLONE ACETATE 80 MG/ML IJ SUSP   Once     09/27/16 1203   09/27/16 0000  naproxen (NAPROSYN) 500 MG tablet  2 times daily with meals    Question:  Supervising Provider  Answer:  Timmothy Euler   09/27/16 1203   09/27/16 0000  cyclobenzaprine (FLEXERIL) 10 MG tablet  3 times daily PRN    Question:  Supervising Provider  Answer:  Timmothy Euler   09/27/16 1203         Objective:    BP 137/86   Pulse 69   Temp 97.7 F (36.5 C) (Oral)   Ht 5\' 4"  (1.626 m)   Wt 135 lb 6.4 oz (61.4 kg)   BMI 23.24 kg/m   No Known Allergies  Physical Exam  Constitutional: She is oriented to person, place, and time. She appears well-developed and well-nourished.  HENT:  Head: Normocephalic and atraumatic.  Eyes: Pupils are equal, round, and reactive to light. Conjunctivae and EOM are normal.  Cardiovascular: Normal rate, regular rhythm, normal heart sounds and intact distal pulses.   Pulmonary/Chest: Effort normal and breath sounds normal.  Abdominal: Soft. Bowel sounds are normal.  Musculoskeletal:       Lumbar back: She exhibits decreased range of motion, tenderness, pain and spasm.       Back:  Neurological: She is alert and oriented to person, place, and time. She has normal reflexes.  Skin: Skin is warm and dry. No rash noted.  Psychiatric: She has a normal mood and affect. Her behavior is normal. Judgment and thought content normal.        Assessment & Plan:   1. Lumbar herniated disc - methylPREDNISolone acetate (DEPO-MEDROL) injection 80 mg; Inject 1 mL (80 mg total) into the muscle once. - naproxen (NAPROSYN) 500 MG tablet; Take 1 tablet (500 mg total) by mouth 2 (two) times daily with a meal.  Dispense: 60 tablet; Refill: 5 - cyclobenzaprine  (FLEXERIL) 10 MG tablet; Take 1 tablet (10 mg total) by mouth 3 (three) times daily as needed for muscle spasms.  Dispense: 60 tablet; Refill: 2  2. Neuropathy - methylPREDNISolone acetate (DEPO-MEDROL) injection 80 mg; Inject 1 mL (80 mg total) into the muscle once. - naproxen (NAPROSYN) 500 MG tablet; Take 1 tablet (500 mg total) by mouth 2 (two) times daily with a meal.  Dispense: 60 tablet; Refill: 5 - cyclobenzaprine (FLEXERIL) 10 MG tablet; Take 1 tablet (10 mg total) by mouth 3 (three) times daily as needed for muscle spasms.  Dispense: 60 tablet; Refill: 2    Current Outpatient Prescriptions:  .  albuterol (PROVENTIL HFA;VENTOLIN HFA) 108 (90 Base) MCG/ACT inhaler, Inhale 2 puffs into the lungs every 6 (six) hours as needed for wheezing or shortness of breath., Disp: 1 Inhaler, Rfl: 1 .  conjugated estrogens (PREMARIN) vaginal cream, Place 1 Applicatorful vaginally daily. (Patient taking differently: Place 1 Applicatorful vaginally at bedtime as needed (for vaginal dryness.). ), Disp: 42.5 g, Rfl: 12 .  lisinopril (PRINIVIL,ZESTRIL) 40 MG tablet, TAKE 1 TABLET DAILY, Disp: 30 tablet, Rfl: 11 .  LORazepam (ATIVAN) 1 MG tablet, TAKE 1 TABLET AT BEDTIME AS NEEDED (Patient taking differently: TAKE 1 TABLET AT BEDTIME AS NEEDED FOR SLEEP), Disp: 60 tablet, Rfl: 2 .  cyclobenzaprine (FLEXERIL) 10 MG tablet, Take 1 tablet (10 mg total) by mouth 3 (three) times daily as needed for muscle spasms., Disp: 60 tablet, Rfl: 2 .  naproxen (NAPROSYN) 500 MG tablet, Take 1 tablet (500 mg total) by mouth 2 (two) times daily with a meal., Disp: 60 tablet, Rfl: 5 Continue all other maintenance medications as listed above.  Follow up plan: Return if symptoms worsen or fail to improve.  Educational handout given for [place] PA-C [facility] [address]  [place], [place] [address] [phone]   09/27/2016, 12:33 PM

## 2016-09-29 ENCOUNTER — Telehealth: Payer: Self-pay | Admitting: Physician Assistant

## 2016-09-29 NOTE — Telephone Encounter (Signed)
AJ gave flexeril  - made her feel like a zombie  - she cant take it Today tried naproxen She called back Dr (neuro)and they're giving her a steroid pack

## 2016-10-12 ENCOUNTER — Encounter: Payer: Self-pay | Admitting: Physician Assistant

## 2016-10-12 DIAGNOSIS — M5126 Other intervertebral disc displacement, lumbar region: Secondary | ICD-10-CM

## 2016-10-13 ENCOUNTER — Telehealth: Payer: Self-pay | Admitting: Physician Assistant

## 2016-11-17 ENCOUNTER — Other Ambulatory Visit: Payer: Self-pay | Admitting: Physician Assistant

## 2016-12-12 ENCOUNTER — Other Ambulatory Visit: Payer: Self-pay | Admitting: Physician Assistant

## 2016-12-12 NOTE — Telephone Encounter (Signed)
Last seen 09/27/16 [name]  If approved route to nurse to call into [facility]

## 2016-12-13 NOTE — Telephone Encounter (Signed)
Phoned in.

## 2016-12-23 ENCOUNTER — Other Ambulatory Visit: Payer: Self-pay | Admitting: Cardiothoracic Surgery

## 2016-12-23 DIAGNOSIS — R911 Solitary pulmonary nodule: Secondary | ICD-10-CM

## 2016-12-23 DIAGNOSIS — Z85118 Personal history of other malignant neoplasm of bronchus and lung: Secondary | ICD-10-CM

## 2016-12-29 ENCOUNTER — Ambulatory Visit (INDEPENDENT_AMBULATORY_CARE_PROVIDER_SITE_OTHER): Payer: BLUE CROSS/BLUE SHIELD | Admitting: Physician Assistant

## 2016-12-29 ENCOUNTER — Encounter: Payer: Self-pay | Admitting: Physician Assistant

## 2016-12-29 VITALS — BP 150/90 | HR 59 | Temp 97.5°F | Ht 64.0 in | Wt 137.0 lb

## 2016-12-29 DIAGNOSIS — F339 Major depressive disorder, recurrent, unspecified: Secondary | ICD-10-CM | POA: Diagnosis not present

## 2016-12-29 MED ORDER — ESCITALOPRAM OXALATE 10 MG PO TABS: 10.0000 mg | ORAL_TABLET | Freq: Every day | ORAL | 5 refills | Status: DC

## 2016-12-29 MED ORDER — ALPRAZOLAM 0.5 MG PO TBDP: 0.5000 mg | ORAL_TABLET | Freq: Three times a day (TID) | ORAL | 1 refills | Status: DC | PRN

## 2016-12-29 NOTE — Patient Instructions (Signed)

## 2017-01-03 NOTE — Progress Notes (Signed)
BP (!) 150/90   Pulse (!) 59   Temp (!) 97.5 F (36.4 C) (Oral)   Ht 5\' 4"  (1.626 m)   Wt 137 lb (62.1 kg)   BMI 23.52 kg/m    Subjective:    Patient ID: [name], female    DOB: [birth year], 56 y.o.   MRN: [id number]  HPI: [name] ALGUIRE is a 56 y.o. female presenting on 12/29/2016 for Anxiety and Depression  Needs to start back on her antidepressant. Has not been on medication in a while and would like to go back on her meds.  She feels down, irritable, tearful.  Depression screen [facility] 2/9 12/29/2016 09/27/2016 08/23/2016 08/15/2016 05/24/2016  Decreased Interest 1 0 0 0 1  Down, Depressed, Hopeless 1 0 0 0 0  PHQ - 2 Score 2 0 0 0 1  Altered sleeping 1 - - - -  Tired, decreased energy 2 - - - -  Change in appetite 0 - - - -  Feeling bad or failure about yourself  2 - - - -  Trouble concentrating 2 - - - -  Moving slowly or fidgety/restless 0 - - - -  Suicidal thoughts 0 - - - -  PHQ-9 Score 9 - - - -     Relevant past medical, surgical, family and social history reviewed and updated as indicated. Allergies and medications reviewed and updated.  Past Medical History:  Diagnosis Date  . Anxiety   . Arthritis   . Constipation 02/17/2015  . Emphysema lung ([place])   . Fibromyalgia   . GERD (gastroesophageal reflux disease)   . Hypertension   . 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 Menopausal vaginal dryness 02/17/2015    Past Surgical History:  Procedure Laterality Date  . CHOLECYSTECTOMY N/A 04/01/2016   Procedure: LAPAROSCOPIC CHOLECYSTECTOMY;  Surgeon: Aviva Signs, MD;  Location: [facility];  Service: General;  Laterality: N/A;  . COLONOSCOPY  12/2010   in [place], per patient, normal, next colonoscopy 10 years  . ESOPHAGOGASTRODUODENOSCOPY N/A 10/16/2013   UVO:[id number] ring s/p dilation/2 cm HH  . 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 Hiatal hernia  . LAPAROSCOPIC APPENDECTOMY N/A  06/21/2013   Procedure: APPENDECTOMY LAPAROSCOPIC;  Surgeon: Jamesetta So, MD;  Location: [facility];  Service: General;  Laterality: N/A;  Venia Minks DILATION N/A 10/16/2013   Procedure: Keturah Shavers;  Surgeon: Daneil Dolin, MD;  [facility];  Service: Endoscopy;  Laterality: N/A;  . PORTACATH PLACEMENT  [id number]   Rt   IJ  Port-A-Cath  dr.burney  . STERNAL WIRES REMOVAL  01/23/2012   Procedure: STERNAL WIRES REMOVAL;  Surgeon: Grace Isaac, MD;  Location: [facility];  Service: Thoracic;  Laterality: N/A;  . THORACOTOMY  [id number]   transsmanubrial anterolaterl thoracotomy with chest wall resection includinf the first rib and left  upper lobectomy for Pancoast turmor  . TUBAL LIGATION      Review of Systems  Constitutional: Negative.  Negative for activity change, fatigue and fever.  HENT: Negative.   Eyes: Negative.   Respiratory: Negative.  Negative for cough.   Cardiovascular: Negative.  Negative for chest pain.  Gastrointestinal: Negative.  Negative for abdominal pain.  Endocrine: Negative.   Genitourinary: Negative.  Negative for dysuria.  Musculoskeletal: Negative.   Skin: Negative.   Neurological: Negative.     Allergies as of 12/29/2016   No Known Allergies     Medication  List        Accurate as of 12/29/16 11:59 PM. Always use your most recent med list.          albuterol 108 (90 Base) MCG/ACT inhaler Commonly known as:  PROVENTIL HFA;VENTOLIN HFA Inhale 2 puffs into the lungs every 6 (six) hours as needed for wheezing or shortness of breath.   ALPRAZolam 0.5 MG dissolvable tablet Commonly known as:  NIRAVAM Take 1 tablet (0.5 mg total) by mouth 3 (three) times daily as needed for anxiety.   escitalopram 10 MG tablet Commonly known as:  LEXAPRO Take 1 tablet (10 mg total) by mouth daily.   lisinopril 40 MG tablet Commonly known as:  PRINIVIL,ZESTRIL TAKE 1 TABLET DAILY   LORazepam 1 MG tablet Commonly known as:  ATIVAN TAKE 1 TABLET AT  BEDTIME AS NEEDED   naproxen 500 MG tablet Commonly known as:  NAPROSYN Take 1 tablet (500 mg total) by mouth 2 (two) times daily with a meal.          Objective:    BP (!) 150/90   Pulse (!) 59   Temp (!) 97.5 F (36.4 C) (Oral)   Ht 5\' 4"  (1.626 m)   Wt 137 lb (62.1 kg)   BMI 23.52 kg/m   No Known Allergies  Physical Exam  Constitutional: She is oriented to person, place, and time. She appears well-developed and well-nourished.  HENT:  Head: Normocephalic and atraumatic.  Eyes: Conjunctivae and EOM are normal. Pupils are equal, round, and reactive to light.  Cardiovascular: Normal rate, regular rhythm, normal heart sounds and intact distal pulses.  Pulmonary/Chest: Effort normal and breath sounds normal.  Abdominal: Soft. Bowel sounds are normal.  Neurological: She is alert and oriented to person, place, and time. She has normal reflexes.  Skin: Skin is warm and dry. No rash noted.  Psychiatric: Her behavior is normal. Judgment and thought content normal. Her speech is not rapid and/or pressured. She exhibits a depressed mood.    Results for orders placed or performed during the hospital encounter of 03/29/16  CBC WITH DIFFERENTIAL  Result Value Ref Range   WBC 8.1 4.0 - 10.5 K/uL   RBC 4.59 3.87 - 5.11 MIL/uL   Hemoglobin 13.2 12.0 - 15.0 g/dL   HCT 40.4 36.0 - 46.0 %   MCV 88.0 78.0 - 100.0 fL   MCH 28.8 26.0 - 34.0 pg   MCHC 32.7 30.0 - 36.0 g/dL   RDW 12.8 11.5 - 15.5 %   Platelets 365 150 - 400 K/uL   Neutrophils Relative % 63 %   Neutro Abs 5.1 1.7 - 7.7 K/uL   Lymphocytes Relative 23 %   Lymphs Abs 1.9 0.7 - 4.0 K/uL   Monocytes Relative 7 %   Monocytes Absolute 0.5 0.1 - 1.0 K/uL   Eosinophils Relative 6 %   Eosinophils Absolute 0.5 0.0 - 0.7 K/uL   Basophils Relative 2 %   Basophils Absolute 0.1 0.0 - 0.1 K/uL  Comprehensive metabolic panel  Result Value Ref Range   Sodium 138 135 - 145 mmol/L   Potassium 4.0 3.5 - 5.1 mmol/L   Chloride 104 101  - 111 mmol/L   CO2 28 22 - 32 mmol/L   Glucose, Bld 91 65 - 99 mg/dL   BUN 23 (H) 6 - 20 mg/dL   Creatinine, Ser 0.82 0.44 - 1.00 mg/dL   Calcium 9.3 8.9 - 10.3 mg/dL   Total Protein 7.8 6.5 - 8.1 g/dL  Albumin 4.1 3.5 - 5.0 g/dL   AST 25 15 - 41 U/L   ALT 23 14 - 54 U/L   Alkaline Phosphatase 123 38 - 126 U/L   Total Bilirubin 0.5 0.3 - 1.2 mg/dL   GFR calc non Af Amer >60 >60 mL/min   GFR calc Af Amer >60 >60 mL/min   Anion gap 6 5 - 15      Assessment & Plan:   1. Depression, recurrent ([place]) - escitalopram (LEXAPRO) 10 MG tablet; Take 1 tablet (10 mg total) by mouth daily.  Dispense: 30 tablet; Refill: 5 - ALPRAZolam (NIRAVAM) 0.5 MG dissolvable tablet; Take 1 tablet (0.5 mg total) by mouth 3 (three) times daily as needed for anxiety.  Dispense: 30 tablet; Refill: 1    Current Outpatient Medications:  .  albuterol (PROVENTIL HFA;VENTOLIN HFA) 108 (90 Base) MCG/ACT inhaler, Inhale 2 puffs into the lungs every 6 (six) hours as needed for wheezing or shortness of breath., Disp: 1 Inhaler, Rfl: 1 .  ALPRAZolam (NIRAVAM) 0.5 MG dissolvable tablet, Take 1 tablet (0.5 mg total) by mouth 3 (three) times daily as needed for anxiety., Disp: 30 tablet, Rfl: 1 .  escitalopram (LEXAPRO) 10 MG tablet, Take 1 tablet (10 mg total) by mouth daily., Disp: 30 tablet, Rfl: 5 .  lisinopril (PRINIVIL,ZESTRIL) 40 MG tablet, TAKE 1 TABLET DAILY, Disp: 30 tablet, Rfl: 4 .  LORazepam (ATIVAN) 1 MG tablet, TAKE 1 TABLET AT BEDTIME AS NEEDED (Patient taking differently: TAKE 1 TABLET AT BEDTIME AS NEEDED FOR SLEEP), Disp: 60 tablet, Rfl: 2 .  naproxen (NAPROSYN) 500 MG tablet, Take 1 tablet (500 mg total) by mouth 2 (two) times daily with a meal., Disp: 60 tablet, Rfl: 5 Continue all other maintenance medications as listed above.  Follow up plan: Return in about 6 months (around 06/29/2017).  Educational handout given for depression  Terald Sleeper PA-C [facility] [address]  [place], [place] [address] [phone]   01/03/2017, 3:47 PM

## 2017-01-05 ENCOUNTER — Other Ambulatory Visit: Payer: Self-pay

## 2017-01-05 ENCOUNTER — Ambulatory Visit
Admission: RE | Admit: 2017-01-05 | Discharge: 2017-01-05 | Disposition: A | Discharge status: Home / Self Care | Payer: BLUE CROSS/BLUE SHIELD | Source: Ambulatory Visit | Attending: Cardiothoracic Surgery | Admitting: Cardiothoracic Surgery

## 2017-01-05 ENCOUNTER — Encounter: Payer: Self-pay | Admitting: Cardiothoracic Surgery

## 2017-01-05 ENCOUNTER — Ambulatory Visit (INDEPENDENT_AMBULATORY_CARE_PROVIDER_SITE_OTHER): Payer: BLUE CROSS/BLUE SHIELD | Admitting: Cardiothoracic Surgery

## 2017-01-05 VITALS — BP 177/113 | HR 59 | Resp 18 | Ht 64.0 in | Wt 137.0 lb

## 2017-01-05 DIAGNOSIS — R911 Solitary pulmonary nodule: Secondary | ICD-10-CM

## 2017-01-05 DIAGNOSIS — C3412 Malignant neoplasm of upper lobe, left bronchus or lung: Secondary | ICD-10-CM | POA: Diagnosis not present

## 2017-01-05 DIAGNOSIS — Z902 Acquired absence of lung [part of]: Secondary | ICD-10-CM | POA: Diagnosis not present

## 2017-01-06 ENCOUNTER — Encounter: Payer: Self-pay | Admitting: Cardiothoracic Surgery

## 2017-01-06 NOTE — Progress Notes (Signed)
[place] Record #[id number] Date of Birth: 08/13/1960  [name] Skill, PA-C  Chief Complaint:   PostOp Follow Up Visit 06/25/2010  PREOPERATIVE DIAGNOSIS: Pancoast tumor.  POSTOPERATIVE DIAGNOSIS: Pancoast tumor.  PROCEDURE PERFORMED: Transmanubrial anterolateral thoracotomy with  chest wall resection including the first rib and left upper lobectomy  for Pancoast tumor.  ypTX , pN0   History of Present Illness:      Patient returns for followup visit today now little over a 61/2 years  after resection of Pancoast tumor through a trans-manubrial anterolateral thoracotomy with chest wall resection. She's done well postoperatively notes that she has full range of motion and function of her left arm. She is not smoking.     Social History   Tobacco Use  Smoking Status Former Smoker  . Packs/day: 1.50  . Types: Cigarettes  . Last attempt to quit: 02/04/2010  . Years since quitting: 6.9  Smokeless Tobacco Never Used  Tobacco Comment   Quit x 5 years       No Known Allergies  Current Outpatient Medications  Medication Sig Dispense Refill  . albuterol (PROVENTIL HFA;VENTOLIN HFA) 108 (90 Base) MCG/ACT inhaler Inhale 2 puffs into the lungs every 6 (six) hours as needed for wheezing or shortness of breath. 1 Inhaler 1  . ALPRAZolam (NIRAVAM) 0.5 MG dissolvable tablet Take 1 tablet (0.5 mg total) by mouth 3 (three) times daily as needed for anxiety. 30 tablet 1  . escitalopram (LEXAPRO) 10 MG tablet Take 1 tablet (10 mg total) by mouth daily. 30 tablet 5  . lisinopril (PRINIVIL,ZESTRIL) 40 MG tablet TAKE 1 TABLET DAILY 30 tablet 4  . LORazepam (ATIVAN) 1 MG tablet TAKE 1 TABLET AT BEDTIME AS NEEDED (Patient taking differently: TAKE 1 TABLET AT BEDTIME AS NEEDED FOR SLEEP) 60 tablet 2  . naproxen (NAPROSYN) 500 MG tablet Take 1 tablet (500 mg total) by mouth 2 (two) times daily with a  meal. 60 tablet 5   No current facility-administered medications for this visit.        Physical Exam: BP (!) 177/113 (BP Location: Left Arm, Patient Position: Sitting, Cuff Size: Normal)   Pulse (!) 59   Resp 18   Ht 5\' 4"  (1.626 m)   Wt 137 lb (62.1 kg)   SpO2 98% Comment: on RA  BMI 23.52 kg/m   General appearance: alert and cooperative Head: Normocephalic, without obvious abnormality, atraumatic Neck: no adenopathy, no carotid bruit, no JVD, supple, symmetrical, trachea midline, thyroid not enlarged, symmetric, no tenderness/mass/nodules and Left neck incision well-healed Lymph nodes: Cervical, supraclavicular, and axillary nodes normal. Resp: clear to auscultation bilaterally Back: symmetric, no curvature. ROM normal. No CVA tenderness. Cardio: regular rate and rhythm, S1, S2 normal, no murmur, click, rub or gallop GI: soft, non-tender; bowel sounds normal; no masses,  no organomegaly Extremities: extremities normal, atraumatic, no cyanosis or edema Neurologic: Grossly normal   Diagnostic Studies & Laboratory data:         Recent Radiology Findings: Ct Chest Wo Contrast  Result Date: 01/06/2017 CLINICAL DATA:  Left upper lobe resection in 2012 for lung cancer with chemotherapy and radiation therapy. Followup of pulmonary nodule. Asymptomatic. EXAM: CT CHEST WITHOUT CONTRAST TECHNIQUE: Multidetector CT imaging  of the chest was performed following the standard protocol without IV contrast. COMPARISON:  Plain film 03/29/2016.  Most recent CT 01/14/2016. FINDINGS: Cardiovascular: Normal heart size, without pericardial effusion. Mediastinum/Nodes: No supraclavicular adenopathy. No mediastinal or definite hilar adenopathy, given limitations of unenhanced CT. Lungs/Pleura: No pleural fluid. Left apical pleural thickening is presumably postoperative and not significantly changed. Moderate centrilobular emphysema. Left upper lobectomy. No suspicious pulmonary nodule or mass. Upper  Abdomen: Cholecystectomy. Normal imaged portions of the liver, spleen, stomach, pancreas, adrenal glands, kidneys. Musculoskeletal: Prior median sternotomy. Partial left first rib resection with posttraumatic or postsurgical deformity, similar. IMPRESSION: 1. Surgical changes at the left apex, including left upper lobectomy. No recurrent or metastatic disease. 2.  Emphysema (ICD10-J43.9). Electronically Signed   By: Abigail Miyamoto M.D.   On: 01/06/2017 09:10    I have independently reviewed the above radiology studies  and reviewed the findings with the patient.    Ct Chest Wo Contrast  Result Date: 01/14/2016 CLINICAL DATA:  Lung cancer status post surgery in 2012. Chemotherapy and radiation therapy completed. Shortness of breath on exertion. EXAM: CT CHEST WITHOUT CONTRAST TECHNIQUE: Multidetector CT imaging of the chest was performed following the standard protocol without IV contrast. COMPARISON:  Chest CT 01/15/2015 and 01/09/2014. FINDINGS: Cardiovascular: No significant vascular findings on noncontrast imaging. The heart size is normal. There is no pericardial effusion. Mediastinum/Nodes: There are no enlarged mediastinal, hilar or axillary lymph nodes.Hilar assessment is limited by the lack of intravenous contrast, although the hilar contours appear unchanged. The thyroid gland, trachea and esophagus demonstrate no significant findings. Lungs/Pleura: There is no pleural effusion. Status post left upper lobe resection. Left apical pleural thickening and moderate emphysematous changes are again noted. No suspicious pulmonary nodules, endobronchial lesions or confluent airspace opacity. Upper abdomen: Cholelithiasis. Stable tubular low-density in the right retrocrural space, likely dilatation of the thoracic duct. The visualized upper abdomen otherwise appears unremarkable as imaged in the noncontrast state. Musculoskeletal/Chest wall: There are stable postsurgical changes in the left first rib status  post partial resection and within the sternal manubrium. No lytic lesions are identified. IMPRESSION: 1. Stable postop chest CT. No evidence of local recurrence or metastatic disease. 2. Emphysema. 3. Cholelithiasis. Electronically Signed   By: Richardean Sale M.D.   On: 01/14/2016 13:52      Recent Labs: Lab Results  Component Value Date   WBC 8.1 03/29/2016   HGB 13.2 03/29/2016   HCT 40.4 03/29/2016   PLT 365 03/29/2016   GLUCOSE 91 03/29/2016   CHOL 231 (H) 05/14/2015   TRIG 86 05/14/2015   HDL 96 05/14/2015   LDLCALC 118 05/14/2015   ALT 23 03/29/2016   AST 25 03/29/2016   NA 138 03/29/2016   K 4.0 03/29/2016   CL 104 03/29/2016   CREATININE 0.82 03/29/2016   BUN 23 (H) 03/29/2016   CO2 28 03/29/2016   TSH 1.720 02/17/2015   INR 0.93 01/23/2012   HGBA1C 5.9 (H) 02/17/2015      Assessment / Plan:   Patient is 6.5 years following  chest wall resection and left upper lobectomy for moderately differentiated adenocarcinoma/Pancoast tumor,  By Physical exam and CT there is no evidence of recurrent disease. Patient remains smoke free since surgery , previous she has 34 pack year smoking history Will refer  for Lung cancer screening program  Plan to see back as needed  follow up with primary care for bp control    Grace Isaac MD

## 2017-02-02 ENCOUNTER — Other Ambulatory Visit: Payer: BLUE CROSS/BLUE SHIELD

## 2017-02-02 ENCOUNTER — Ambulatory Visit: Payer: BLUE CROSS/BLUE SHIELD | Admitting: Cardiothoracic Surgery

## 2017-04-04 ENCOUNTER — Other Ambulatory Visit: Payer: BLUE CROSS/BLUE SHIELD

## 2017-04-04 ENCOUNTER — Other Ambulatory Visit: Payer: Self-pay | Admitting: Physician Assistant

## 2017-04-04 DIAGNOSIS — Z0000 Encounter for general adult medical examination without abnormal findings: Secondary | ICD-10-CM

## 2017-04-05 ENCOUNTER — Telehealth: Payer: Self-pay | Admitting: Physician Assistant

## 2017-04-05 LAB — CMP14+EGFR
A/G RATIO: 1.4 (ref 1.2–2.2)
ALK PHOS: 140 IU/L — AB (ref 39–117)
ALT: 50 IU/L — ABNORMAL HIGH (ref 0–32)
AST: 36 IU/L (ref 0–40)
Albumin: 3.8 g/dL (ref 3.5–5.5)
BUN/Creatinine Ratio: 37 — ABNORMAL HIGH (ref 9–23)
BUN: 30 mg/dL — ABNORMAL HIGH (ref 6–24)
Bilirubin Total: 0.2 mg/dL (ref 0.0–1.2)
CO2: 24 mmol/L (ref 20–29)
CREATININE: 0.82 mg/dL (ref 0.57–1.00)
Calcium: 9 mg/dL (ref 8.7–10.2)
Chloride: 105 mmol/L (ref 96–106)
GFR calc Af Amer: 92 mL/min/{1.73_m2} (ref >59)
GFR, EST NON AFRICAN AMERICAN: 80 mL/min/{1.73_m2} (ref >59)
Globulin, Total: 2.8 g/dL (ref 1.5–4.5)
Glucose: 86 mg/dL (ref 65–99)
POTASSIUM: 4.3 mmol/L (ref 3.5–5.2)
Sodium: 142 mmol/L (ref 134–144)
Total Protein: 6.6 g/dL (ref 6.0–8.5)

## 2017-04-05 LAB — CBC WITH DIFFERENTIAL/PLATELET
BASOS: 2 %
Basophils Absolute: 0.1 10*3/uL (ref 0.0–0.2)
EOS (ABSOLUTE): 0.2 10*3/uL (ref 0.0–0.4)
Eos: 3 %
HEMATOCRIT: 39.4 % (ref 34.0–46.6)
Hemoglobin: 13 g/dL (ref 11.1–15.9)
IMMATURE GRANS (ABS): 0 10*3/uL (ref 0.0–0.1)
IMMATURE GRANULOCYTES: 1 %
LYMPHS: 30 %
Lymphocytes Absolute: 1.8 10*3/uL (ref 0.7–3.1)
MCH: 28.7 pg (ref 26.6–33.0)
MCHC: 33 g/dL (ref 31.5–35.7)
MCV: 87 fL (ref 79–97)
MONOS ABS: 0.4 10*3/uL (ref 0.1–0.9)
Monocytes: 6 %
NEUTROS PCT: 58 %
Neutrophils Absolute: 3.6 10*3/uL (ref 1.4–7.0)
PLATELETS: 290 10*3/uL (ref 150–379)
RBC: 4.53 x10E6/uL (ref 3.77–5.28)
RDW: 13.5 % (ref 12.3–15.4)
WBC: 6.1 10*3/uL (ref 3.4–10.8)

## 2017-04-05 LAB — THYROID PANEL WITH TSH
Free Thyroxine Index: 1.5 (ref 1.2–4.9)
T3 Uptake Ratio: 24 % (ref 24–39)
T4, Total: 6.4 ug/dL (ref 4.5–12.0)
TSH: 2.08 u[IU]/mL (ref 0.450–4.500)

## 2017-04-05 LAB — VITAMIN D 25 HYDROXY (VIT D DEFICIENCY, FRACTURES): Vit D, 25-Hydroxy: 25.1 ng/mL — ABNORMAL LOW (ref 30.0–100.0)

## 2017-04-05 LAB — LIPID PANEL
CHOL/HDL RATIO: 2.9 ratio (ref 0.0–4.4)
Cholesterol, Total: 205 mg/dL — ABNORMAL HIGH (ref 100–199)
HDL: 70 mg/dL (ref >39)
LDL CALC: 118 mg/dL — AB (ref 0–99)
Triglycerides: 85 mg/dL (ref 0–149)
VLDL CHOLESTEROL CAL: 17 mg/dL (ref 5–40)

## 2017-04-05 NOTE — Telephone Encounter (Signed)
See lab note.  

## 2017-04-05 NOTE — Telephone Encounter (Signed)
The lab results have been posted now.

## 2017-04-07 LAB — SPECIMEN STATUS REPORT

## 2017-04-07 LAB — VITAMIN B12: VITAMIN B 12: 550 pg/mL (ref 232–1245)

## 2017-05-08 ENCOUNTER — Other Ambulatory Visit: Payer: Self-pay | Admitting: Physician Assistant

## 2017-06-07 ENCOUNTER — Other Ambulatory Visit: Payer: Self-pay | Admitting: Physician Assistant

## 2017-06-07 DIAGNOSIS — F339 Major depressive disorder, recurrent, unspecified: Secondary | ICD-10-CM

## 2017-06-07 NOTE — Telephone Encounter (Signed)
Last seen 12/29/16  [facility]

## 2017-07-06 ENCOUNTER — Other Ambulatory Visit: Payer: Self-pay | Admitting: Physician Assistant

## 2017-07-28 ENCOUNTER — Ambulatory Visit: Payer: BLUE CROSS/BLUE SHIELD | Admitting: Family

## 2017-07-28 ENCOUNTER — Encounter: Payer: Self-pay | Admitting: Family

## 2017-07-28 VITALS — BP 131/77 | HR 60 | Temp 98.4°F | Ht 64.0 in | Wt 139.6 lb

## 2017-07-28 DIAGNOSIS — M5442 Lumbago with sciatica, left side: Secondary | ICD-10-CM

## 2017-07-28 DIAGNOSIS — G8929 Other chronic pain: Secondary | ICD-10-CM | POA: Diagnosis not present

## 2017-07-28 MED ORDER — METHYLPREDNISOLONE ACETATE 80 MG/ML IJ SUSP
80.0000 mg | Freq: Once | INTRAMUSCULAR | Status: AC
  Administered 2017-07-28: 80 mg via INTRAMUSCULAR

## 2017-07-28 MED ORDER — PREDNISONE 10 MG (21) PO TBPK: ORAL_TABLET | ORAL | 0 refills | Status: DC

## 2017-07-28 MED ORDER — KETOROLAC TROMETHAMINE 60 MG/2ML IM SOLN
60.0000 mg | Freq: Once | INTRAMUSCULAR | Status: AC
Start: 2017-07-28 — End: 2017-07-28
  Administered 2017-07-28: 60 mg via INTRAMUSCULAR

## 2017-07-28 NOTE — Patient Instructions (Signed)
Sciatica Sciatica is pain, numbness, weakness, or tingling along the path of the sciatic nerve. The sciatic nerve starts in the lower back and runs down the back of each leg. The nerve controls the muscles in the lower leg and in the back of the knee. It also provides feeling (sensation) to the back of the thigh, the lower leg, and the sole of the foot. Sciatica is a symptom of another medical condition that pinches or puts pressure on the sciatic nerve. Generally, sciatica only affects one side of the body. Sciatica usually goes away on its own or with treatment. In some cases, sciatica may keep coming back (recur). What are the causes? This condition is caused by pressure on the sciatic nerve, or pinching of the sciatic nerve. This may be the result of:  A disk in between the bones of the spine (vertebrae) bulging out too far (herniated disk).  Age-related changes in the spinal disks (degenerative disk disease).  A pain disorder that affects a muscle in the buttock (piriformis syndrome).  Extra bone growth (bone spur) near the sciatic nerve.  An injury or break (fracture) of the pelvis.  Pregnancy.  Tumor (rare).  What increases the risk? The following factors may make you more likely to develop this condition:  Playing sports that place pressure or stress on the spine, such as football or weight lifting.  Having poor strength and flexibility.  A history of back injury.  A history of back surgery.  Sitting for long periods of time.  Doing activities that involve repetitive bending or lifting.  Obesity.  What are the signs or symptoms? Symptoms can vary from mild to very severe, and they may include:  Any of these problems in the lower back, leg, hip, or buttock: ? Mild tingling or dull aches. ? Burning sensations. ? Sharp pains.  Numbness in the back of the calf or the sole of the foot.  Leg weakness.  Severe back pain that makes movement difficult.  These  symptoms may get worse when you cough, sneeze, or laugh, or when you sit or stand for long periods of time. Being overweight may also make symptoms worse. In some cases, symptoms may recur over time. How is this diagnosed? This condition may be diagnosed based on:  Your symptoms.  A physical exam. Your health care provider may ask you to do certain movements to check whether those movements trigger your symptoms.  You may have tests, including: ? Blood tests. ? X-rays. ? MRI. ? CT scan.  How is this treated? In many cases, this condition improves on its own, without any treatment. However, treatment may include:  Reducing or modifying physical activity during periods of pain.  Exercising and stretching to strengthen your abdomen and improve the flexibility of your spine.  Icing and applying heat to the affected area.  Medicines that help: ? To relieve pain and swelling. ? To relax your muscles.  Injections of medicines that help to relieve pain, irritation, and inflammation around the sciatic nerve (steroids).  Surgery.  Follow these instructions at home: Medicines  Take over-the-counter and prescription medicines only as told by your health care provider.  Do not drive or operate heavy machinery while taking prescription pain medicine. Managing pain  If directed, apply ice to the affected area. ? Put ice in a plastic bag. ? Place a towel between your skin and the bag. ? Leave the ice on for 20 minutes, 2-3 times a day.  After icing, apply  heat to the affected area before you exercise or as often as told by your health care provider. Use the heat source that your health care provider recommends, such as a moist heat pack or a heating pad. ? Place a towel between your skin and the heat source. ? Leave the heat on for 20-30 minutes. ? Remove the heat if your skin turns bright red. This is especially important if you are unable to feel pain, heat, or cold. You may have a  greater risk of getting burned. Activity  Return to your normal activities as told by your health care provider. Ask your health care provider what activities are safe for you. ? Avoid activities that make your symptoms worse.  Take brief periods of rest throughout the day. Resting in a lying or standing position is usually better than sitting to rest. ? When you rest for longer periods, mix in some mild activity or stretching between periods of rest. This will help to prevent stiffness and pain. ? Avoid sitting for long periods of time without moving. Get up and move around at least one time each hour.  Exercise and stretch regularly, as told by your health care provider.  Do not lift anything that is heavier than 10 lb (4.5 kg) while you have symptoms of sciatica. When you do not have symptoms, you should still avoid heavy lifting, especially repetitive heavy lifting.  When you lift objects, always use proper lifting technique, which includes: ? Bending your knees. ? Keeping the load close to your body. ? Avoiding twisting. General instructions  Use good posture. ? Avoid leaning forward while sitting. ? Avoid hunching over while standing.  Maintain a healthy weight. Excess weight puts extra stress on your back and makes it difficult to maintain good posture.  Wear supportive, comfortable shoes. Avoid wearing high heels.  Avoid sleeping on a mattress that is too soft or too hard. A mattress that is firm enough to support your back when you sleep may help to reduce your pain.  Keep all follow-up visits as told by your health care provider. This is important. Contact a health care provider if:  You have pain that wakes you up when you are sleeping.  You have pain that gets worse when you lie down.  Your pain is worse than you have experienced in the past.  Your pain lasts longer than 4 weeks.  You experience unexplained weight loss. Get help right away if:  You lose control  of your bowel or bladder (incontinence).  You have: ? Weakness in your lower back, pelvis, buttocks, or legs that gets worse. ? Redness or swelling of your back. ? A burning sensation when you urinate. This information is not intended to replace advice given to you by your health care provider. Make sure you discuss any questions you have with your health care provider. Document Released: 01/04/2001 Document Revised: 06/16/2015 Document Reviewed: 09/19/2014 Elsevier Interactive Patient Education  [employer].

## 2017-07-28 NOTE — Progress Notes (Signed)
   Subjective:    Patient ID: [name], female    DOB: November 03, 1960, 57 y.o.   MRN: [id number]  Chief Complaint  Patient presents with  . Back Pain    Back Pain  This is a chronic problem. The current episode started more than 1 month ago. The problem occurs intermittently. The pain is present in the lumbar spine. The quality of the pain is described as aching. The pain radiates to the left thigh. The pain is at a severity of 8/10. The pain is moderate. The symptoms are aggravated by bending and standing. Associated symptoms include leg pain, tingling and weakness. Pertinent negatives include no bladder incontinence or bowel incontinence. She has tried NSAIDs for the symptoms. The treatment provided mild relief.      Review of Systems  Gastrointestinal: Negative for bowel incontinence.  Genitourinary: Negative for bladder incontinence.  Musculoskeletal: Positive for back pain.  Neurological: Positive for tingling and weakness.  All other systems reviewed and are negative.      Objective:   Physical Exam  Constitutional: She is oriented to person, place, and time. She appears well-developed and well-nourished. No distress.  HENT:  Head: Normocephalic.  Eyes: Pupils are equal, round, and reactive to light.  Neck: Normal range of motion. Neck supple. No thyromegaly present.  Cardiovascular: Normal rate, regular rhythm, normal heart sounds and intact distal pulses.  No murmur heard. Pulmonary/Chest: Effort normal and breath sounds normal. No respiratory distress. She has no wheezes.  Abdominal: Soft. Bowel sounds are normal. She exhibits no distension. There is no tenderness.  Musculoskeletal: She exhibits no edema or tenderness.  Pain in lower lumbar with flexion or extension. Negative SLR  Neurological: She is alert and oriented to person, place, and time. She has normal reflexes. No cranial nerve deficit.  Skin: Skin is warm and dry.  Psychiatric: She has a normal mood and  affect. Her behavior is normal. Judgment and thought content normal.  Vitals reviewed.     BP 131/77   Pulse 60   Temp 98.4 F (36.9 C) (Oral)   Ht 5\' 4"  (1.626 m)   Wt 139 lb 9.6 oz (63.3 kg)   BMI 23.96 kg/m      Assessment & Plan:  [name] was seen today for back pain.  Diagnoses and all orders for this visit:  Chronic bilateral low back pain with left-sided sciatica -     methylPREDNISolone acetate (DEPO-MEDROL) injection 80 mg -     ketorolac (TORADOL) injection 60 mg -     predniSONE (STERAPRED UNI-PAK 21 TAB) 10 MG (21) TBPK tablet; Use as directed   Rest Ice Heat ROM exercises Continue NSAID's RTO if pain worsens or does not improve  Evelina Dun, FNP

## 2017-08-04 ENCOUNTER — Other Ambulatory Visit: Payer: Self-pay | Admitting: Physician Assistant

## 2017-08-18 ENCOUNTER — Other Ambulatory Visit: Payer: Self-pay | Admitting: Physician Assistant

## 2017-08-18 NOTE — Telephone Encounter (Signed)
Last seen 7/19

## 2017-09-21 ENCOUNTER — Encounter: Payer: Self-pay | Admitting: Physician Assistant

## 2017-09-21 ENCOUNTER — Ambulatory Visit: Payer: BLUE CROSS/BLUE SHIELD | Admitting: Physician Assistant

## 2017-09-21 VITALS — BP 149/90 | HR 54 | Temp 97.9°F | Ht 64.0 in | Wt 138.4 lb

## 2017-09-21 DIAGNOSIS — T783XXA Angioneurotic edema, initial encounter: Secondary | ICD-10-CM | POA: Diagnosis not present

## 2017-09-21 MED ORDER — HYDROCODONE-ACETAMINOPHEN 10-325 MG PO TABS: 2.0000 | ORAL_TABLET | Freq: Four times a day (QID) | ORAL | 0 refills | Status: DC | PRN

## 2017-09-21 MED ORDER — AMLODIPINE BESYLATE 5 MG PO TABS
5.0000 mg | ORAL_TABLET | Freq: Every day | ORAL | 3 refills | Status: DC
Start: 2017-09-21 — End: 2017-11-09

## 2017-09-25 NOTE — Progress Notes (Signed)
This patient has had some lip swelling for about 4 weeks.  She states that at the beginning it would come and go.  However at this time it is fairly regular and standing.  She was concerned that she was eating something different.  It is of note that she is taking lisinopril.  She has been on it for some years.  I have discussed the possibilities of edema being caused by this medication I think this would be the best thing to eliminate.  We will try different medication for her blood pressure.  She is to call if she has any more difficulties.    BP (!) 149/90   Pulse (!) 54   Temp 97.9 F (36.6 C) (Oral)   Ht 5\' 4"  (1.626 m)   Wt 138 lb 6.4 oz (62.8 kg)   BMI 23.76 kg/m     Subjective:    Patient ID: [name], female    DOB: [birth year], 57 y.o.   MRN: [id number]  HPI: [name] is a 57 y.o. female presenting on 09/21/2017 for lips swelling    Past Medical History:  Diagnosis Date  . Anxiety   . Arthritis   . Constipation 02/17/2015  . Emphysema lung ([place])   . Fibromyalgia   . GERD (gastroesophageal reflux disease)   . Hypertension   . Lung cancer ([place])    pancoast tumor  . Menopausal vaginal dryness 02/17/2015   Relevant past medical, surgical, family and social history reviewed and updated as indicated. Interim medical history since our last visit reviewed. Allergies and medications reviewed and updated. DATA REVIEWED: CHART IN EPIC  Family History reviewed for pertinent findings.  Review of Systems  Constitutional: Negative.   HENT: Negative.   Eyes: Negative.   Respiratory: Negative.   Gastrointestinal: Negative.   Genitourinary: Negative.     Allergies as of 09/21/2017   No Known Allergies     Medication List        Accurate as of 09/21/17 11:59 PM. Always use your most recent med list.          albuterol 108 (90 Base) MCG/ACT inhaler Commonly known as:  PROVENTIL HFA;VENTOLIN HFA Inhale 2 puffs into the lungs every 6 (six) hours as needed  for wheezing or shortness of breath.   ALPRAZolam 0.5 MG dissolvable tablet Commonly known as:  NIRAVAM Take 1 tablet (0.5 mg total) by mouth 3 (three) times daily as needed for anxiety.   amLODipine 5 MG tablet Commonly known as:  NORVASC Take 1 tablet (5 mg total) by mouth daily.   escitalopram 10 MG tablet Commonly known as:  LEXAPRO Take 1 tablet (10 mg total) by mouth daily.   HYDROcodone-acetaminophen 10-325 MG tablet Commonly known as:  NORCO Take 2 tablets by mouth every 6 (six) hours as needed.   ibuprofen 800 MG tablet Commonly known as:  ADVIL,MOTRIN TAKE  (1)  TABLET  EVERY EIGHT HOURS AS NEEDED.   LORazepam 1 MG tablet Commonly known as:  ATIVAN TAKE 1 TABLET AT BEDTIME AS NEEDED          Objective:    BP (!) 149/90   Pulse (!) 54   Temp 97.9 F (36.6 C) (Oral)   Ht 5\' 4"  (1.626 m)   Wt 138 lb 6.4 oz (62.8 kg)   BMI 23.76 kg/m    No Known Allergies  Wt Readings from Last 3 Encounters:  09/21/17 138 lb 6.4 oz (62.8 kg)  07/28/17 139 lb 9.6  oz (63.3 kg)  01/05/17 137 lb (62.1 kg)    Physical Exam  Constitutional: She is oriented to person, place, and time. She appears well-developed and well-nourished.  HENT:  Head: Normocephalic and atraumatic.    Swelling of the lips with stretched irritated skin  Eyes: Pupils are equal, round, and reactive to light. Conjunctivae and EOM are normal.  Cardiovascular: Normal rate, regular rhythm, normal heart sounds and intact distal pulses.  Pulmonary/Chest: Effort normal and breath sounds normal.  Abdominal: Soft. Bowel sounds are normal.  Neurological: She is alert and oriented to person, place, and time. She has normal reflexes.  Skin: Skin is warm and dry. No rash noted.  Psychiatric: She has a normal mood and affect. Her behavior is normal. Judgment and thought content normal.        Assessment & Plan:   1. Angioedema, initial encounter Stop lisinopril Start amlodipine    Continue all other  maintenance medications as listed above.  Follow up plan: Return in about 6 months (around 03/24/2018).  Educational handout given for [place] PA-C [facility] [address]  [place], [place] [address] [phone]   09/25/2017, 10:01 PM

## 2017-09-26 ENCOUNTER — Other Ambulatory Visit: Payer: Self-pay | Admitting: Physician Assistant

## 2017-09-26 ENCOUNTER — Telehealth: Payer: Self-pay | Admitting: Physician Assistant

## 2017-09-26 NOTE — Telephone Encounter (Signed)
Pt aware by detailed VM 

## 2017-09-26 NOTE — Telephone Encounter (Signed)
Patient blood pressure at night is running about 148/94. In the mornings it is running 122/79, in the afternoon 147/92. This is with increasing her amlodipine to 10mg  daily. Do you want to make any changes or keep her at taking 2 amlodipine?

## 2017-09-26 NOTE — Telephone Encounter (Signed)
Okay to go to 10 mg daily, watch for any low readings.

## 2017-09-27 ENCOUNTER — Telehealth: Payer: Self-pay | Admitting: Physician Assistant

## 2017-09-27 ENCOUNTER — Other Ambulatory Visit: Payer: Self-pay | Admitting: Physician Assistant

## 2017-09-27 ENCOUNTER — Ambulatory Visit: Payer: BLUE CROSS/BLUE SHIELD | Admitting: Pediatrics

## 2017-09-27 DIAGNOSIS — G8929 Other chronic pain: Secondary | ICD-10-CM

## 2017-09-27 DIAGNOSIS — M5442 Lumbago with sciatica, left side: Principal | ICD-10-CM

## 2017-09-27 MED ORDER — PREDNISONE 10 MG (48) PO TBPK: ORAL_TABLET | ORAL | 0 refills | Status: DC

## 2017-09-27 NOTE — Telephone Encounter (Signed)
Sent to [facility]

## 2017-09-27 NOTE — Telephone Encounter (Signed)
Would she want a muscle relaxant and pack of prednisone?

## 2017-09-27 NOTE — Telephone Encounter (Signed)
Patient states she has some flexeril at home but would like the steroid

## 2017-09-27 NOTE — Telephone Encounter (Signed)
Patient aware.

## 2017-09-27 NOTE — Telephone Encounter (Signed)
What symptoms do you have? Lower back pain and numbness down left leg  How long have you been sick? Three days  Have you been seen for this problem? Has appt this afternoon but wants to see if Glenard Haring will call her in something in so she can get something quicker.   If your provider decides to give you a prescription, which pharmacy would you like for it to be sent to? [facility]   Patient informed that this information will be sent to the clinical staff for review and that they should receive a follow up call.

## 2017-10-23 ENCOUNTER — Encounter: Payer: Self-pay | Admitting: Physician Assistant

## 2017-10-24 ENCOUNTER — Telehealth: Payer: Self-pay | Admitting: Physician Assistant

## 2017-10-24 ENCOUNTER — Other Ambulatory Visit: Payer: Self-pay | Admitting: Physician Assistant

## 2017-10-24 DIAGNOSIS — M5442 Lumbago with sciatica, left side: Principal | ICD-10-CM

## 2017-10-24 DIAGNOSIS — G8929 Other chronic pain: Secondary | ICD-10-CM

## 2017-10-24 MED ORDER — PREDNISONE 10 MG (48) PO TBPK: ORAL_TABLET | ORAL | 1 refills | Status: DC

## 2017-10-24 NOTE — Telephone Encounter (Signed)
Sent patients mychart message to [facility].

## 2017-11-09 ENCOUNTER — Other Ambulatory Visit: Payer: Self-pay | Admitting: *Deleted

## 2017-11-09 MED ORDER — AMLODIPINE BESYLATE 10 MG PO TABS: 10.0000 mg | ORAL_TABLET | Freq: Every day | ORAL | 0 refills | Status: DC

## 2017-11-09 MED ORDER — AMLODIPINE BESYLATE 5 MG PO TABS: 5.0000 mg | ORAL_TABLET | Freq: Every day | ORAL | 0 refills | Status: DC

## 2017-11-09 NOTE — Addendum Note (Signed)
Addended by: Thana Ates on: 11/09/2017 03:59 PM   Modules accepted: Orders

## 2017-11-11 ENCOUNTER — Encounter: Payer: Self-pay | Admitting: Family Medicine

## 2017-11-11 ENCOUNTER — Ambulatory Visit: Payer: BLUE CROSS/BLUE SHIELD | Admitting: Family Medicine

## 2017-11-11 VITALS — BP 138/84 | HR 74 | Temp 97.6°F | Ht 64.0 in | Wt 137.0 lb

## 2017-11-11 DIAGNOSIS — R002 Palpitations: Secondary | ICD-10-CM

## 2017-11-11 DIAGNOSIS — I10 Essential (primary) hypertension: Secondary | ICD-10-CM

## 2017-11-11 MED ORDER — DILTIAZEM HCL ER COATED BEADS 180 MG PO CP24
180.0000 mg | ORAL_CAPSULE | Freq: Two times a day (BID) | ORAL | 2 refills | Status: DC
Start: 2017-11-11 — End: 2018-02-07

## 2017-11-11 NOTE — Progress Notes (Signed)
Subjective:  Patient ID: [name], female    DOB: [birth year]  Age: 57 y.o. MRN: [id number]  CC: Palpitations (BP med increased, has started having palpitations, increased last 2 days, has had lung cancer, some pressure on left side but more than usual)   HPI [name] presents for 2 days of increasing palpitations.  [name] also noted her blood pressure to be elevated when checked at home.  [name] went to local pharmacy this morning and had a pressure of 155/96.  Additionally [name] is noted that her pulse has been as high as 120.  All of this seemed to start when [name] was switched to a 10 mg amlodipine as opposed to the 5 mg [name] has been taking for about 6 weeks.  The 5 mg did not seem to be strong enough to reduce her blood pressures so the dose was increased to 2 5 mg a day.  Patient was about to go pick up the new 10 mg prescription when [name] started noticing these symptoms.  They are not associated with shortness of breath or chest pain.  The patient does have a baseline shortness of breath from a history of lung cancer.  However that has not been exacerbated with these episodes.  Depression screen [facility] 2/9 09/21/2017 07/28/2017 12/29/2016  Decreased Interest 0 0 1  Down, Depressed, Hopeless 0 0 1  PHQ - 2 Score 0 0 2  Altered sleeping - - 1  Tired, decreased energy - - 2  Change in appetite - - 0  Feeling bad or failure about yourself  - - 2  Trouble concentrating - - 2  Moving slowly or fidgety/restless - - 0  Suicidal thoughts - - 0  PHQ-9 Score - - 9    History [name] has a past medical history of Anxiety, Arthritis, Constipation (02/17/2015), Emphysema lung ([place]), Fibromyalgia, GERD (gastroesophageal reflux disease), Hypertension, Lung cancer ([place]), and Menopausal vaginal dryness (02/17/2015).   [name] has a past surgical history that includes Thoracotomy ([id number]); Portacath placement ([id number]); Tubal ligation; Sternal wires removal (01/23/2012); Colonoscopy (12/2010);  laparoscopic appendectomy (N/A, 06/21/2013); Foreign Body Removal (N/A, 09/08/2013); Esophagogastroduodenoscopy (N/A, 10/16/2013); maloney dilation (N/A, 10/16/2013); and Cholecystectomy (N/A, 04/01/2016).   Her family history includes Cancer in her brother and mother; Heart disease in her sister; Hypertension in her father and sister; Stroke in her father.[name] reports that [name] quit smoking about 7 years ago. Her smoking use included cigarettes. [name] smoked 1.50 packs per day. [name] has never used smokeless tobacco. [name] reports that [name] does not drink alcohol or use drugs.    ROS Review of Systems  Constitutional: Negative.  Negative for activity change and appetite change.  HENT: Negative for congestion.   Eyes: Negative for visual disturbance.  Respiratory: Negative for shortness of breath.   Cardiovascular: Negative for chest pain.  Gastrointestinal: Negative for abdominal pain and nausea.  Musculoskeletal: Negative.   Neurological: Negative for headaches.  Psychiatric/Behavioral: Negative for sleep disturbance.    Objective:  BP 138/84   Pulse 74   Temp 97.6 F (36.4 C) (Oral)   Ht 5\' 4"  (1.626 m)   Wt 137 lb (62.1 kg)   BMI 23.52 kg/m   BP Readings from Last 3 Encounters:  11/11/17 138/84  09/21/17 (!) 149/90  07/28/17 131/77    Wt Readings from Last 3 Encounters:  11/11/17 137 lb (62.1 kg)  09/21/17 138 lb 6.4 oz (62.8 kg)  07/28/17 139 lb 9.6 oz (63.3 kg)  Physical Exam  Constitutional: [name] is oriented to person, place, and time. [name] appears well-developed and well-nourished. No distress.  Cardiovascular: Normal rate, regular rhythm and normal heart sounds.  Pulmonary/Chest: Breath sounds normal.  Abdominal: Soft. There is no tenderness.  Neurological: [name] is alert and oriented to person, place, and time.  Skin: Skin is warm and dry.  Psychiatric: [name] has a normal mood and affect. Her behavior is normal.      Assessment & Plan:   [name] was seen today for  palpitations.  Diagnoses and all orders for this visit:  Intermittent palpitations  Essential hypertension  Other orders -     diltiazem (CARDIZEM CD) 180 MG 24 hr capsule; Take 1 capsule (180 mg total) by mouth 2 (two) times daily. For blood pressure       I am having [name] start on diltiazem. I am also having her maintain her albuterol, ALPRAZolam, escitalopram, LORazepam, ibuprofen, HYDROcodone-acetaminophen, predniSONE, and amLODipine.  Allergies as of 11/11/2017      Reactions   Lisinopril Swelling   Of lips      Medication List        Accurate as of 11/11/17  1:09 PM. Always use your most recent med list.          albuterol 108 (90 Base) MCG/ACT inhaler Commonly known as:  PROVENTIL HFA;VENTOLIN HFA Inhale 2 puffs into the lungs every 6 (six) hours as needed for wheezing or shortness of breath.   ALPRAZolam 0.5 MG dissolvable tablet Commonly known as:  NIRAVAM Take 1 tablet (0.5 mg total) by mouth 3 (three) times daily as needed for anxiety.   amLODipine 10 MG tablet Commonly known as:  NORVASC Take 1 tablet (10 mg total) by mouth daily.   diltiazem 180 MG 24 hr capsule Commonly known as:  CARDIZEM CD Take 1 capsule (180 mg total) by mouth 2 (two) times daily. For blood pressure   escitalopram 10 MG tablet Commonly known as:  LEXAPRO Take 1 tablet (10 mg total) by mouth daily.   HYDROcodone-acetaminophen 10-325 MG tablet Commonly known as:  NORCO Take 2 tablets by mouth every 6 (six) hours as needed.   ibuprofen 800 MG tablet Commonly known as:  ADVIL,MOTRIN TAKE  (1)  TABLET  EVERY EIGHT HOURS AS NEEDED.   LORazepam 1 MG tablet Commonly known as:  ATIVAN TAKE 1 TABLET AT BEDTIME AS NEEDED   predniSONE 10 MG (48) Tbpk tablet Commonly known as:  STERAPRED UNI-PAK 48 TAB Take one tab daily for OA        Follow-up: Return in about 2 weeks (around 11/25/2017) for hypertension with Glenard Haring.  Claretta Fraise, M.D.

## 2017-11-14 ENCOUNTER — Telehealth: Payer: Self-pay

## 2017-11-14 NOTE — Telephone Encounter (Signed)
Patient called and VM message left regarding Cancer screening CT scan.  Will await a return call if warranted.

## 2017-11-14 NOTE — Telephone Encounter (Signed)
-----   Message from Magdalen Spatz, NP sent at 11/14/2017  1:50 PM EDT ----- She is on our list to be called in Nov. For her December scan. Thanks ----- Message ----- From: Donnella Sham, RN Sent: 11/14/2017  11:07 AM EDT To: Magdalen Spatz, NP  Good Morning,  I work at [employer] with Dr. Servando Snare.  This patient is due for a CT for cancer screening in December.  She had a referral placed last year in December to have the yearly cancer screenings.  Is she on the list to be called?  She stated she had not heard anything.  Thanks so much,  Charlena Cross, RN

## 2017-11-21 ENCOUNTER — Telehealth: Payer: Self-pay | Admitting: *Deleted

## 2017-11-21 DIAGNOSIS — Z87891 Personal history of nicotine dependence: Secondary | ICD-10-CM

## 2017-11-21 DIAGNOSIS — Z122 Encounter for screening for malignant neoplasm of respiratory organs: Secondary | ICD-10-CM

## 2017-11-22 NOTE — Telephone Encounter (Signed)
Pt is calling back [phone]

## 2017-11-23 NOTE — Telephone Encounter (Signed)
Spoke with pt and scheduled SD[facility] 12/11/17 4:00 CT ordered Nothing further needed

## 2017-12-01 ENCOUNTER — Other Ambulatory Visit: Payer: Self-pay | Admitting: Physician Assistant

## 2017-12-01 DIAGNOSIS — F339 Major depressive disorder, recurrent, unspecified: Secondary | ICD-10-CM

## 2017-12-04 ENCOUNTER — Ambulatory Visit: Payer: BLUE CROSS/BLUE SHIELD

## 2017-12-04 ENCOUNTER — Ambulatory Visit (INDEPENDENT_AMBULATORY_CARE_PROVIDER_SITE_OTHER): Payer: BLUE CROSS/BLUE SHIELD | Admitting: Physician Assistant

## 2017-12-04 VITALS — BP 158/98 | HR 68 | Temp 98.2°F

## 2017-12-04 DIAGNOSIS — R112 Nausea with vomiting, unspecified: Secondary | ICD-10-CM | POA: Diagnosis not present

## 2017-12-04 DIAGNOSIS — K219 Gastro-esophageal reflux disease without esophagitis: Secondary | ICD-10-CM

## 2017-12-04 DIAGNOSIS — R0789 Other chest pain: Secondary | ICD-10-CM | POA: Diagnosis not present

## 2017-12-04 MED ORDER — PROMETHAZINE HCL 50 MG/ML IJ SOLN: 50.0000 mg | Freq: Once | INTRAMUSCULAR | Status: DC

## 2017-12-04 MED ORDER — ONDANSETRON 8 MG PO TBDP: 8.0000 mg | ORAL_TABLET | Freq: Three times a day (TID) | ORAL | 0 refills | Status: DC | PRN

## 2017-12-04 MED ORDER — PROMETHAZINE HCL 25 MG/ML IJ SOLN
50.0000 mg | Freq: Once | INTRAMUSCULAR | Status: AC
  Administered 2017-12-04: 50 mg via INTRAMUSCULAR

## 2017-12-04 MED ORDER — OMEPRAZOLE 20 MG PO CPDR: 20.0000 mg | DELAYED_RELEASE_CAPSULE | Freq: Every day | ORAL | 1 refills | Status: DC

## 2017-12-04 MED ORDER — ALUM & MAG HYDROXIDE-SIMETH 200-200-20 MG/5ML PO SUSP
30.0000 mL | Freq: Once | ORAL | Status: AC
  Administered 2017-12-04: 30 mL via ORAL

## 2017-12-04 NOTE — Patient Instructions (Signed)

## 2017-12-05 NOTE — Progress Notes (Signed)
BP (!) 158/98   Pulse 68   Temp 98.2 F (36.8 C)   SpO2 98%    Subjective:    Patient ID: [name], female    DOB: [birth year], 57 y.o.   MRN: [id number]  HPI: [name] is a 56 y.o. female presenting on 12/04/2017 for GI upset (sudden onset)  Patient comes in with a severe one day onset was very sudden epigastric pain.  She has had some vomiting.  She has had a long term history of GERD many years ago it has been fairly well controlled.  She has not been taking any new or different medications.  She states that she has had some chills.  She has not run a fever.  She has not eaten any food that she thought might have been contaminated.  Past Medical History:  Diagnosis Date  . Anxiety   . Arthritis   . Constipation 02/17/2015  . Emphysema lung ([place])   . Fibromyalgia   . GERD (gastroesophageal reflux disease)   . Hypertension   . Lung cancer ([place])    pancoast tumor  . Menopausal vaginal dryness 02/17/2015   Relevant past medical, surgical, family and social history reviewed and updated as indicated. Interim medical history since our last visit reviewed. Allergies and medications reviewed and updated. DATA REVIEWED: CHART IN EPIC  Family History reviewed for pertinent findings.  Review of Systems  Constitutional: Negative.   HENT: Negative.   Eyes: Negative.   Respiratory: Negative.   Cardiovascular: Positive for chest pain.  Gastrointestinal: Positive for abdominal distention, abdominal pain, nausea and vomiting.  Genitourinary: Negative.     Allergies as of 12/04/2017      Reactions   Lisinopril Swelling   Of lips      Medication List        Accurate as of 12/04/17 11:59 PM. Always use your most recent med list.          albuterol 108 (90 Base) MCG/ACT inhaler Commonly known as:  PROVENTIL HFA;VENTOLIN HFA Inhale 2 puffs into the lungs every 6 (six) hours as needed for wheezing or shortness of breath.   ALPRAZolam 0.5 MG dissolvable  tablet Commonly known as:  NIRAVAM Take 1 tablet (0.5 mg total) by mouth 3 (three) times daily as needed for anxiety.   amLODipine 10 MG tablet Commonly known as:  NORVASC Take 1 tablet (10 mg total) by mouth daily.   diltiazem 180 MG 24 hr capsule Commonly known as:  CARDIZEM CD Take 1 capsule (180 mg total) by mouth 2 (two) times daily. For blood pressure   escitalopram 10 MG tablet Commonly known as:  LEXAPRO Take 1 tablet (10 mg total) by mouth daily. (Needs to be seen before next refill)   HYDROcodone-acetaminophen 10-325 MG tablet Commonly known as:  NORCO Take 2 tablets by mouth every 6 (six) hours as needed.   ibuprofen 800 MG tablet Commonly known as:  ADVIL,MOTRIN TAKE  (1)  TABLET  EVERY EIGHT HOURS AS NEEDED.   LORazepam 1 MG tablet Commonly known as:  ATIVAN TAKE 1 TABLET AT BEDTIME AS NEEDED   omeprazole 20 MG capsule Commonly known as:  PRILOSEC Take 1 capsule (20 mg total) by mouth daily.   ondansetron 8 MG disintegrating tablet Commonly known as:  ZOFRAN-ODT Take 1 tablet (8 mg total) by mouth every 8 (eight) hours as needed for nausea or vomiting.   predniSONE 10 MG (48) Tbpk tablet Commonly known as:  STERAPRED UNI-PAK 48 TAB  Take one tab daily for OA          Objective:    BP (!) 158/98   Pulse 68   Temp 98.2 F (36.8 C)   SpO2 98%   Allergies  Allergen Reactions  . Lisinopril Swelling    Of lips    Wt Readings from Last 3 Encounters:  11/11/17 137 lb (62.1 kg)  09/21/17 138 lb 6.4 oz (62.8 kg)  07/28/17 139 lb 9.6 oz (63.3 kg)    Physical Exam  Constitutional: She appears well-developed and well-nourished. She appears distressed.  HENT:  Head: Normocephalic and atraumatic.  Eyes: Pupils are equal, round, and reactive to light. Conjunctivae and EOM are normal.  Cardiovascular: Normal rate, regular rhythm and normal heart sounds.  Pulmonary/Chest: Effort normal and breath sounds normal. No respiratory distress.  Abdominal: Bowel  sounds are increased. There is generalized tenderness. There is no rebound and no guarding.  Skin: Skin is warm and dry. She is not diaphoretic.  Psychiatric: She has a normal mood and affect. Her behavior is normal.  Nursing note and vitals reviewed.   Results for orders placed or performed in visit on 04/04/17  VITAMIN D 25 Hydroxy (Vit-D Deficiency, Fractures)  Result Value Ref Range   Vit D, 25-Hydroxy 25.1 (L) 30.0 - 100.0 ng/mL  Thyroid Panel With TSH  Result Value Ref Range   TSH 2.080 0.450 - 4.500 uIU/mL   T4, Total 6.4 4.5 - 12.0 ug/dL   T3 Uptake Ratio 24 24 - 39 %   Free Thyroxine Index 1.5 1.2 - 4.9  Lipid panel  Result Value Ref Range   Cholesterol, Total 205 (H) 100 - 199 mg/dL   Triglycerides 85 0 - 149 mg/dL   HDL 70 >39 mg/dL   VLDL Cholesterol Cal 17 5 - 40 mg/dL   LDL Calculated 118 (H) 0 - 99 mg/dL   Chol/HDL Ratio 2.9 0.0 - 4.4 ratio  CMP14+EGFR  Result Value Ref Range   Glucose 86 65 - 99 mg/dL   BUN 30 (H) 6 - 24 mg/dL   Creatinine, Ser 0.82 0.57 - 1.00 mg/dL   GFR calc non Af Amer 80 >59 mL/min/1.73   GFR calc Af Amer 92 >59 mL/min/1.73   BUN/Creatinine Ratio 37 (H) 9 - 23   Sodium 142 134 - 144 mmol/L   Potassium 4.3 3.5 - 5.2 mmol/L   Chloride 105 96 - 106 mmol/L   CO2 24 20 - 29 mmol/L   Calcium 9.0 8.7 - 10.2 mg/dL   Total Protein 6.6 6.0 - 8.5 g/dL   Albumin 3.8 3.5 - 5.5 g/dL   Globulin, Total 2.8 1.5 - 4.5 g/dL   Albumin/Globulin Ratio 1.4 1.2 - 2.2   Bilirubin Total 0.2 0.0 - 1.2 mg/dL   Alkaline Phosphatase 140 (H) 39 - 117 IU/L   AST 36 0 - 40 IU/L   ALT 50 (H) 0 - 32 IU/L  CBC with Differential/Platelet  Result Value Ref Range   WBC 6.1 3.4 - 10.8 x10E3/uL   RBC 4.53 3.77 - 5.28 x10E6/uL   Hemoglobin 13.0 11.1 - 15.9 g/dL   Hematocrit 39.4 34.0 - 46.6 %   MCV 87 79 - 97 fL   MCH 28.7 26.6 - 33.0 pg   MCHC 33.0 31.5 - 35.7 g/dL   RDW 13.5 12.3 - 15.4 %   Platelets 290 150 - 379 x10E3/uL   Neutrophils 58 Not Estab. %   Lymphs  30 Not Estab. %  Monocytes 6 Not Estab. %   Eos 3 Not Estab. %   Basos 2 Not Estab. %   Neutrophils Absolute 3.6 1.4 - 7.0 x10E3/uL   Lymphocytes Absolute 1.8 0.7 - 3.1 x10E3/uL   Monocytes Absolute 0.4 0.1 - 0.9 x10E3/uL   EOS (ABSOLUTE) 0.2 0.0 - 0.4 x10E3/uL   Basophils Absolute 0.1 0.0 - 0.2 x10E3/uL   Immature Granulocytes 1 Not Estab. %   Immature Grans (Abs) 0.0 0.0 - 0.1 x10E3/uL   Hematology Comments: Note:   Vitamin B12  Result Value Ref Range   Vitamin B-12 550 232 - 1,245 pg/mL  Specimen status report  Result Value Ref Range   specimen status report Comment       Assessment & Plan:   1. Chest tightness - EKG 12-Lead NORMAL FINDINGS on EKG  2. Nausea and vomiting, intractability of vomiting not specified, unspecified vomiting type - EKG 12-Lead - promethazine (PHENERGAN) injection 50 mg - alum & mag hydroxide-simeth (MAALOX/MYLANTA) 200-200-20 MG/5ML suspension 30 mL - ondansetron (ZOFRAN ODT) 8 MG disintegrating tablet; Take 1 tablet (8 mg total) by mouth every 8 (eight) hours as needed for nausea or vomiting.  Dispense: 20 tablet; Refill: 0  3. Gastroesophageal reflux disease without esophagitis - omeprazole (PRILOSEC) 20 MG capsule; Take 1 capsule (20 mg total) by mouth daily.  Dispense: 90 capsule; Refill: 1   Continue all other maintenance medications as listed above.  Follow up plan: No follow-ups on file.  Educational handout given for [place] PA-C [place] [address]  [place], [place] [address] [phone]   12/05/2017, 12:26 PM

## 2017-12-09 ENCOUNTER — Other Ambulatory Visit: Payer: Self-pay | Admitting: Physician Assistant

## 2017-12-11 ENCOUNTER — Encounter: Payer: BLUE CROSS/BLUE SHIELD | Admitting: Acute Care

## 2017-12-18 ENCOUNTER — Ambulatory Visit
Admission: RE | Admit: 2017-12-18 | Discharge: 2017-12-18 | Disposition: A | Discharge status: Home / Self Care | Payer: BLUE CROSS/BLUE SHIELD | Source: Ambulatory Visit | Attending: Acute Care | Admitting: Acute Care

## 2017-12-18 ENCOUNTER — Ambulatory Visit (INDEPENDENT_AMBULATORY_CARE_PROVIDER_SITE_OTHER): Payer: BLUE CROSS/BLUE SHIELD | Admitting: Acute Care

## 2017-12-18 ENCOUNTER — Encounter: Payer: Self-pay | Admitting: Acute Care

## 2017-12-18 ENCOUNTER — Telehealth: Payer: Self-pay | Admitting: Acute Care

## 2017-12-18 VITALS — BP 160/90 | HR 72 | Wt 145.0 lb

## 2017-12-18 DIAGNOSIS — Z87891 Personal history of nicotine dependence: Secondary | ICD-10-CM

## 2017-12-18 DIAGNOSIS — Z122 Encounter for screening for malignant neoplasm of respiratory organs: Secondary | ICD-10-CM

## 2017-12-18 NOTE — Telephone Encounter (Signed)
I just called Ena Dawley back and when she left this message for [place] to call her about it. She then called the patient and the patient  had been told that her insurance may not cover the CT and was planning on paying out of pocket for the CT at [place] [address].00.

## 2017-12-18 NOTE — Telephone Encounter (Signed)
Per Rodena Piety, she was taking care of this. Rodena Piety please advise, thank you.

## 2017-12-18 NOTE — Progress Notes (Signed)
Shared Decision Making Visit Lung Cancer Screening Program [phone])   Eligibility:  Age 57 y.o.  Pack Years Smoking History Calculation  (# packs/per year x # years smoked)  Recent History of coughing up blood  no  Unexplained weight loss? no ( >Than 15 pounds within the last 6 months )  Prior History Lung / other cancer no (Diagnosis within the last 5 years already requiring surveillance chest CT Scans).  Smoking Status Former Smoker  Former Smokers: Years since quit: 8 years  Quit Date: 2011  Visit Components:  Discussion included one or more decision making aids. yes  Discussion included risk/benefits of screening. yes  Discussion included potential follow up diagnostic testing for abnormal scans. yes  Discussion included meaning and risk of over diagnosis. yes  Discussion included meaning and risk of False Positives. yes  Discussion included meaning of total radiation exposure. yes  Counseling Included:  Importance of adherence to annual lung cancer LDCT screening. yes  Impact of comorbidities on ability to participate in the program. yes  Ability and willingness to under diagnostic treatment. yes  Smoking Cessation Counseling:  Current Smokers:   Discussed importance of smoking cessation. NA  Information about tobacco cessation classes and interventions provided to patient. yes  Patient provided with "ticket" for LDCT Scan. yes  Symptomatic Patient. no  Counseling  Diagnosis Code: Tobacco Use Z72.0  Asymptomatic Patient yes  Counseling (Intermediate counseling: > three minutes counseling) J8250  Former Smokers:   Discussed the importance of maintaining cigarette abstinence. yes  Diagnosis Code: Personal History of Nicotine Dependence. N39.767  Information about tobacco cessation classes and interventions provided to patient. Yes  Patient provided with "ticket" for LDCT Scan. yes  Written Order for Lung Cancer Screening with LDCT placed in  Epic. Yes (CT Chest Lung Cancer Screening Low Dose W/O CM) [id number] Z12.2-Screening of respiratory organs Z87.891-Personal history of nicotine dependence  I spent 25 minutes of face to face time with Ms. Mott discussing the risks and benefits of lung cancer screening. We viewed a power point together that explained in detail the above noted topics. We took the time to pause the power point at intervals to allow for questions to be asked and answered to ensure understanding. We discussed that she had taken the single most powerful action possible to decrease her risk of developing lung cancer when she quit smoking. I counseled her to remain smoke free, and to contact me if she ever had the desire to smoke again so that I can provide resources and tools to help support the effort to remain smoke free. We discussed the time and location of the scan, and that either  Doroteo Glassman RN or I will call with the results within  24-48 hours of receiving them. She has my card and contact information in the event she needs to speak with me, in addition to a copy of the power point we reviewed as a resource. She verbalized understanding of all of the above and had no further questions upon leaving the office.     I explained to the patient that there has been a high incidence of coronary artery disease noted on these exams. I explained that this is a non-gated exam therefore degree or severity cannot be determined. This patient is not  on statin therapy. I have asked the patient to follow-up with their PCP regarding any incidental finding of coronary artery disease and management with diet or medication as they feel is clinically indicated. The patient  verbalized understanding of the above and had no further questions.     Magdalen Spatz, NP 12/18/2017 4:33 PM

## 2017-12-20 ENCOUNTER — Telehealth: Payer: Self-pay | Admitting: Acute Care

## 2017-12-20 DIAGNOSIS — Z122 Encounter for screening for malignant neoplasm of respiratory organs: Secondary | ICD-10-CM

## 2017-12-20 DIAGNOSIS — Z87891 Personal history of nicotine dependence: Secondary | ICD-10-CM

## 2017-12-20 NOTE — Telephone Encounter (Signed)
LMTC x 1  

## 2017-12-22 ENCOUNTER — Telehealth: Payer: Self-pay | Admitting: Physician Assistant

## 2017-12-22 NOTE — Telephone Encounter (Signed)
Called patient, unable to reach left message to give [place] a call back. Will route to [place].

## 2017-12-22 NOTE — Telephone Encounter (Signed)
PT is calling to get test results of low dose CT that she had done at [facility] office, said that they are not there to give results and that they were suppose to send the results AJ

## 2017-12-22 NOTE — Telephone Encounter (Signed)
Left voicemail for patient to return call.

## 2017-12-22 NOTE — Telephone Encounter (Signed)
Pt is calling back [phone]

## 2017-12-22 NOTE — Telephone Encounter (Signed)
Advised patient, she will need to get results from ordering physician's office.

## 2017-12-25 NOTE — Telephone Encounter (Signed)
Routing encounter to [place].

## 2017-12-26 ENCOUNTER — Encounter: Payer: Self-pay | Admitting: Physician Assistant

## 2017-12-26 ENCOUNTER — Ambulatory Visit: Payer: BLUE CROSS/BLUE SHIELD | Admitting: Physician Assistant

## 2017-12-26 VITALS — BP 151/83 | HR 87 | Temp 98.4°F | Ht 64.0 in | Wt 142.0 lb

## 2017-12-26 DIAGNOSIS — K219 Gastro-esophageal reflux disease without esophagitis: Secondary | ICD-10-CM | POA: Diagnosis not present

## 2017-12-26 DIAGNOSIS — F339 Major depressive disorder, recurrent, unspecified: Secondary | ICD-10-CM

## 2017-12-26 MED ORDER — ESOMEPRAZOLE MAGNESIUM 40 MG PO CPDR: 40.0000 mg | DELAYED_RELEASE_CAPSULE | Freq: Every day | ORAL | 11 refills | Status: DC

## 2017-12-26 MED ORDER — ESCITALOPRAM OXALATE 10 MG PO TABS: 10.0000 mg | ORAL_TABLET | Freq: Every day | ORAL | 0 refills | Status: DC

## 2017-12-26 MED ORDER — ALPRAZOLAM 0.5 MG PO TABS: 0.5000 mg | ORAL_TABLET | Freq: Two times a day (BID) | ORAL | 1 refills | Status: DC | PRN

## 2017-12-26 NOTE — Telephone Encounter (Signed)
Pt informed of CT results per Sarah Groce, NP.  PT verbalized understanding.  Copy sent to PCP.  Order placed for 1 yr f/u CT.  

## 2017-12-27 LAB — CBC WITH DIFFERENTIAL/PLATELET
Basophils Absolute: 0.1 10*3/uL (ref 0.0–0.2)
Basos: 1 %
EOS (ABSOLUTE): 0.1 10*3/uL (ref 0.0–0.4)
Eos: 1 %
HEMATOCRIT: 37.7 % (ref 34.0–46.6)
Hemoglobin: 12.8 g/dL (ref 11.1–15.9)
Immature Grans (Abs): 0.1 10*3/uL (ref 0.0–0.1)
Immature Granulocytes: 1 %
Lymphocytes Absolute: 1.9 10*3/uL (ref 0.7–3.1)
Lymphs: 18 %
MCH: 29.4 pg (ref 26.6–33.0)
MCHC: 34 g/dL (ref 31.5–35.7)
MCV: 87 fL (ref 79–97)
Monocytes Absolute: 0.5 10*3/uL (ref 0.1–0.9)
Monocytes: 5 %
Neutrophils Absolute: 7.7 10*3/uL — ABNORMAL HIGH (ref 1.4–7.0)
Neutrophils: 74 %
Platelets: 402 10*3/uL (ref 150–450)
RBC: 4.35 x10E6/uL (ref 3.77–5.28)
RDW: 12.2 % — ABNORMAL LOW (ref 12.3–15.4)
WBC: 10.4 10*3/uL (ref 3.4–10.8)

## 2017-12-27 LAB — CMP14+EGFR
ALT: 15 IU/L (ref 0–32)
AST: 13 IU/L (ref 0–40)
Albumin/Globulin Ratio: 2 (ref 1.2–2.2)
Albumin: 4.4 g/dL (ref 3.5–5.5)
Alkaline Phosphatase: 120 IU/L — ABNORMAL HIGH (ref 39–117)
BUN/Creatinine Ratio: 26 — ABNORMAL HIGH (ref 9–23)
BUN: 20 mg/dL (ref 6–24)
Bilirubin Total: 0.2 mg/dL (ref 0.0–1.2)
CO2: 25 mmol/L (ref 20–29)
Calcium: 9.2 mg/dL (ref 8.7–10.2)
Chloride: 101 mmol/L (ref 96–106)
Creatinine, Ser: 0.76 mg/dL (ref 0.57–1.00)
GFR calc Af Amer: 100 mL/min/{1.73_m2} (ref >59)
GFR calc non Af Amer: 87 mL/min/{1.73_m2} (ref >59)
Globulin, Total: 2.2 g/dL (ref 1.5–4.5)
Glucose: 106 mg/dL — ABNORMAL HIGH (ref 65–99)
Potassium: 3.4 mmol/L — ABNORMAL LOW (ref 3.5–5.2)
Sodium: 141 mmol/L (ref 134–144)
Total Protein: 6.6 g/dL (ref 6.0–8.5)

## 2017-12-27 NOTE — Progress Notes (Signed)
BP (!) 151/83   Pulse 87   Temp 98.4 F (36.9 C) (Oral)   Ht '5\' 4"'$  (1.626 m)   Wt 142 lb (64.4 kg)   BMI 24.37 kg/m    Subjective:    Patient ID: [name], female    DOB: 1960/02/27, 57 y.o.   MRN: [id number]  HPI: [name] is a 57 y.o. female presenting on 12/26/2017 for Gastroesophageal Reflux (3 week follow up )  This patient comes in for a recheck of a fairly severe GERD episode about 4 weeks ago.  She has had issues with GERD in the past.  On that particular day she had an extremely bad episode.  She felt that she had a stomach virus she had vomiting and severe nausea.  She was seen in our office.  It has had great improvement since starting the Prilosec.  She has had a couple episodes that were much lesser in intensity.  She does have nausea medicine if she needs it.  We are going make one adjustment in increasing her GERD medicine from Prilosec to Nexium 40 mg 1 daily.  In addition she does need refills on her Celexa.  She is doing very well with that and would like to continue for the next year.  Refills will be sent. Depression screen [facility] 2/9 12/26/2017 09/21/2017 07/28/2017 12/29/2016 09/27/2016  Decreased Interest 0 0 0 1 0  Down, Depressed, Hopeless 0 0 0 1 0  PHQ - 2 Score 0 0 0 2 0  Altered sleeping - - - 1 -  Tired, decreased energy - - - 2 -  Change in appetite - - - 0 -  Feeling bad or failure about yourself  - - - 2 -  Trouble concentrating - - - 2 -  Moving slowly or fidgety/restless - - - 0 -  Suicidal thoughts - - - 0 -  PHQ-9 Score - - - 9 -     Past Medical History:  Diagnosis Date  . Anxiety   . Arthritis   . Constipation 02/17/2015  . Emphysema lung ([place])   . Fibromyalgia   . GERD (gastroesophageal reflux disease)   . Hypertension   . Lung cancer ([place])    pancoast tumor  . Menopausal vaginal dryness 02/17/2015   Relevant past medical, surgical, family and social history reviewed and updated as indicated. Interim medical history  since our last visit reviewed. Allergies and medications reviewed and updated. DATA REVIEWED: CHART IN EPIC  Family History reviewed for pertinent findings.  Review of Systems  Constitutional: Negative.  Negative for activity change, fatigue and fever.  HENT: Negative.   Eyes: Negative.   Respiratory: Negative.  Negative for cough.   Cardiovascular: Negative.  Negative for chest pain.  Gastrointestinal: Positive for abdominal pain.  Endocrine: Negative.   Genitourinary: Negative.  Negative for dysuria.  Musculoskeletal: Negative.   Skin: Negative.   Neurological: Negative.     Allergies as of 12/26/2017      Reactions   Lisinopril Swelling   Of lips      Medication List        Accurate as of 12/26/17 11:59 PM. Always use your most recent med list.          albuterol 108 (90 Base) MCG/ACT inhaler Commonly known as:  PROVENTIL HFA;VENTOLIN HFA Inhale 2 puffs into the lungs every 6 (six) hours as needed for wheezing or shortness of breath.   ALPRAZolam 0.5 MG tablet  Commonly known as:  XANAX Take 1 tablet (0.5 mg total) by mouth 2 (two) times daily as needed for anxiety.   diltiazem 180 MG 24 hr capsule Commonly known as:  CARDIZEM CD Take 1 capsule (180 mg total) by mouth 2 (two) times daily. For blood pressure   escitalopram 10 MG tablet Commonly known as:  LEXAPRO Take 1 tablet (10 mg total) by mouth daily. (Needs to be seen before next refill)   esomeprazole 40 MG capsule Commonly known as:  NEXIUM Take 1 capsule (40 mg total) by mouth daily at 12 noon.   HYDROcodone-acetaminophen 10-325 MG tablet Commonly known as:  NORCO Take 2 tablets by mouth every 6 (six) hours as needed.   ibuprofen 800 MG tablet Commonly known as:  ADVIL,MOTRIN TAKE  (1)  TABLET  EVERY EIGHT HOURS AS NEEDED.   LORazepam 1 MG tablet Commonly known as:  ATIVAN TAKE 1 TABLET AT BEDTIME AS NEEDED   ondansetron 8 MG disintegrating tablet Commonly known as:  ZOFRAN-ODT Take 1 tablet  (8 mg total) by mouth every 8 (eight) hours as needed for nausea or vomiting.   predniSONE 10 MG tablet Commonly known as:  DELTASONE          Objective:    BP (!) 151/83   Pulse 87   Temp 98.4 F (36.9 C) (Oral)   Ht '5\' 4"'$  (1.626 m)   Wt 142 lb (64.4 kg)   BMI 24.37 kg/m   Allergies  Allergen Reactions  . Lisinopril Swelling    Of lips    Wt Readings from Last 3 Encounters:  12/26/17 142 lb (64.4 kg)  12/18/17 145 lb (65.8 kg)  11/11/17 137 lb (62.1 kg)    Physical Exam  Constitutional: She is oriented to person, place, and time. She appears well-developed and well-nourished.  HENT:  Head: Normocephalic and atraumatic.  Right Ear: Tympanic membrane, external ear and ear canal normal.  Left Ear: Tympanic membrane, external ear and ear canal normal.  Nose: Nose normal. No rhinorrhea.  Mouth/Throat: Oropharynx is clear and moist and mucous membranes are normal. No oropharyngeal exudate or posterior oropharyngeal erythema.  Eyes: Pupils are equal, round, and reactive to light. Conjunctivae and EOM are normal.  Neck: Normal range of motion. Neck supple.  Cardiovascular: Normal rate, regular rhythm, normal heart sounds and intact distal pulses.  Pulmonary/Chest: Effort normal and breath sounds normal.  Abdominal: Soft. Bowel sounds are normal.  Neurological: She is alert and oriented to person, place, and time. She has normal reflexes.  Skin: Skin is warm and dry. No rash noted.  Psychiatric: She has a normal mood and affect. Her behavior is normal. Judgment and thought content normal.    Results for orders placed or performed in visit on 12/26/17  CBC with Differential/Platelet  Result Value Ref Range   WBC 10.4 3.4 - 10.8 x10E3/uL   RBC 4.35 3.77 - 5.28 x10E6/uL   Hemoglobin 12.8 11.1 - 15.9 g/dL   Hematocrit 37.7 34.0 - 46.6 %   MCV 87 79 - 97 fL   MCH 29.4 26.6 - 33.0 pg   MCHC 34.0 31.5 - 35.7 g/dL   RDW 12.2 (L) 12.3 - 15.4 %   Platelets 402 150 - 450  x10E3/uL   Neutrophils 74 Not Estab. %   Lymphs 18 Not Estab. %   Monocytes 5 Not Estab. %   Eos 1 Not Estab. %   Basos 1 Not Estab. %   Neutrophils Absolute 7.7 (H) 1.4 -  7.0 x10E3/uL   Lymphocytes Absolute 1.9 0.7 - 3.1 x10E3/uL   Monocytes Absolute 0.5 0.1 - 0.9 x10E3/uL   EOS (ABSOLUTE) 0.1 0.0 - 0.4 x10E3/uL   Basophils Absolute 0.1 0.0 - 0.2 x10E3/uL   Immature Granulocytes 1 Not Estab. %   Immature Grans (Abs) 0.1 0.0 - 0.1 x10E3/uL  CMP14+EGFR  Result Value Ref Range   Glucose 106 (H) 65 - 99 mg/dL   BUN 20 6 - 24 mg/dL   Creatinine, Ser 0.76 0.57 - 1.00 mg/dL   GFR calc non Af Amer 87 >59 mL/min/1.73   GFR calc Af Amer 100 >59 mL/min/1.73   BUN/Creatinine Ratio 26 (H) 9 - 23   Sodium 141 134 - 144 mmol/L   Potassium 3.4 (L) 3.5 - 5.2 mmol/L   Chloride 101 96 - 106 mmol/L   CO2 25 20 - 29 mmol/L   Calcium 9.2 8.7 - 10.2 mg/dL   Total Protein 6.6 6.0 - 8.5 g/dL   Albumin 4.4 3.5 - 5.5 g/dL   Globulin, Total 2.2 1.5 - 4.5 g/dL   Albumin/Globulin Ratio 2.0 1.2 - 2.2   Bilirubin Total 0.2 0.0 - 1.2 mg/dL   Alkaline Phosphatase 120 (H) 39 - 117 IU/L   AST 13 0 - 40 IU/L   ALT 15 0 - 32 IU/L      Assessment & Plan:   1. Depression, recurrent ([place]) - escitalopram (LEXAPRO) 10 MG tablet; Take 1 tablet (10 mg total) by mouth daily. (Needs to be seen before next refill)  Dispense: 30 tablet; Refill: 0  2. Gastroesophageal reflux disease without esophagitis - CBC with Differential/Platelet - CMP14+EGFR   Continue all other maintenance medications as listed above.  Follow up plan: No follow-ups on file.  Educational handout given for [place] PA-C [place] [address]  [place], [place] [address] [phone]   12/27/2017, 2:09 PM

## 2018-01-26 ENCOUNTER — Other Ambulatory Visit: Payer: Self-pay | Admitting: Physician Assistant

## 2018-01-26 DIAGNOSIS — F339 Major depressive disorder, recurrent, unspecified: Secondary | ICD-10-CM

## 2018-02-06 ENCOUNTER — Other Ambulatory Visit: Payer: Self-pay | Admitting: Family Medicine

## 2018-02-07 ENCOUNTER — Other Ambulatory Visit: Payer: Self-pay | Admitting: Physician Assistant

## 2018-02-07 MED ORDER — PREDNISONE 10 MG PO TABS: 10.0000 mg | ORAL_TABLET | Freq: Two times a day (BID) | ORAL | 1 refills | Status: DC

## 2018-02-07 NOTE — Telephone Encounter (Signed)
What is the name of the medication? Prednisone   Have you contacted your pharmacy to request a refill? YES  Which pharmacy would you like this sent to? [facility]   Patient notified that their request is being sent to the clinical staff for review and that they should receive a call once it is complete. If they do not receive a call within 24 hours they can check with their pharmacy or our office.

## 2018-02-13 ENCOUNTER — Encounter: Payer: Self-pay | Admitting: Family Medicine

## 2018-02-13 ENCOUNTER — Ambulatory Visit: Payer: BLUE CROSS/BLUE SHIELD | Admitting: Family Medicine

## 2018-02-13 VITALS — BP 137/80 | HR 63 | Temp 98.8°F | Ht 64.0 in | Wt 149.0 lb

## 2018-02-13 DIAGNOSIS — R3915 Urgency of urination: Secondary | ICD-10-CM | POA: Diagnosis not present

## 2018-02-13 DIAGNOSIS — N3090 Cystitis, unspecified without hematuria: Secondary | ICD-10-CM

## 2018-02-13 LAB — URINALYSIS
Bilirubin, UA: NEGATIVE
Leukocytes, UA: NEGATIVE
Nitrite, UA: POSITIVE — AB
PH UA: 6 (ref 5.0–7.5)
Specific Gravity, UA: 1.02 (ref 1.005–1.030)
Urobilinogen, Ur: 1 mg/dL (ref 0.2–1.0)

## 2018-02-13 MED ORDER — AMOXICILLIN 500 MG PO CAPS: 500.0000 mg | ORAL_CAPSULE | Freq: Three times a day (TID) | ORAL | 0 refills | Status: DC

## 2018-02-13 NOTE — Progress Notes (Signed)
Chief Complaint  Patient presents with  . Urinary Tract Infection    HPI  Patient presents today for burning with urination and frequency for several days. Denies fever . No flank pain. No nausea, vomiting.   PMH: Smoking status noted ROS: Per HPI  Objective: BP 137/80   Pulse 63   Temp 98.8 F (37.1 C) (Oral)   Ht 5\' 4"  (1.626 m)   Wt 149 lb (67.6 kg)   BMI 25.58 kg/m  Gen: NAD, alert, cooperative with exam HEENT: NCAT, EOMI, PERRL CV: RRR, good S1/S2, no murmur Resp: CTABL, no wheezes, non-labored Abd: SNTND, BS present, no guarding or organomegaly Ext: No edema, warm Neuro: Alert and oriented, No gross deficits  Assessment and plan:  1. Cystitis   2. Urinary urgency     Meds ordered this encounter  Medications  . amoxicillin (AMOXIL) 500 MG capsule    Sig: Take 1 capsule (500 mg total) by mouth 3 (three) times daily.    Dispense:  21 capsule    Refill:  0    Orders Placed This Encounter  Procedures  . Urine Culture  . Urinalysis    Follow up as needed.  Claretta Fraise, MD

## 2018-02-14 LAB — URINE CULTURE

## 2018-02-15 ENCOUNTER — Encounter: Payer: Self-pay | Admitting: Physician Assistant

## 2018-02-16 MED ORDER — CIPROFLOXACIN HCL 500 MG PO TABS: 500.0000 mg | ORAL_TABLET | Freq: Two times a day (BID) | ORAL | 0 refills | Status: DC

## 2018-02-17 ENCOUNTER — Other Ambulatory Visit: Payer: Self-pay | Admitting: Physician Assistant

## 2018-02-26 ENCOUNTER — Other Ambulatory Visit: Payer: BLUE CROSS/BLUE SHIELD

## 2018-02-26 ENCOUNTER — Other Ambulatory Visit: Payer: Self-pay | Admitting: Physician Assistant

## 2018-02-26 ENCOUNTER — Telehealth: Payer: Self-pay | Admitting: Physician Assistant

## 2018-02-26 DIAGNOSIS — R300 Dysuria: Secondary | ICD-10-CM

## 2018-02-26 MED ORDER — NITROFURANTOIN MONOHYD MACRO 100 MG PO CAPS: 100.0000 mg | ORAL_CAPSULE | Freq: Two times a day (BID) | ORAL | 0 refills | Status: DC

## 2018-02-26 NOTE — Telephone Encounter (Signed)
Patient aware.

## 2018-02-26 NOTE — Telephone Encounter (Signed)
Macrobid 1 tablet twice daily sent to her pharmacy.

## 2018-02-27 LAB — MICROSCOPIC EXAMINATION
Mucus, UA: NONE SEEN
Renal Epithel, UA: NONE SEEN /hpf

## 2018-02-27 LAB — URINALYSIS, COMPLETE
BILIRUBIN UA: NEGATIVE
Glucose, UA: NEGATIVE
Ketones, UA: NEGATIVE
Leukocytes, UA: NEGATIVE
Nitrite, UA: POSITIVE — AB
PH UA: 5.5 (ref 5.0–7.5)
Protein, UA: NEGATIVE
RBC, UA: NEGATIVE
Specific Gravity, UA: >1.03 — ABNORMAL HIGH (ref 1.005–1.030)
Urobilinogen, Ur: 0.2 mg/dL (ref 0.2–1.0)

## 2018-02-28 LAB — URINE CULTURE

## 2018-03-19 ENCOUNTER — Other Ambulatory Visit: Payer: Self-pay | Admitting: Physician Assistant

## 2018-03-19 ENCOUNTER — Telehealth: Payer: Self-pay | Admitting: Physician Assistant

## 2018-03-19 MED ORDER — HYDROCODONE-ACETAMINOPHEN 10-325 MG PO TABS: 2.0000 | ORAL_TABLET | Freq: Four times a day (QID) | ORAL | 0 refills | Status: DC | PRN

## 2018-03-19 NOTE — Telephone Encounter (Signed)
She rarely gets meds and does not need a contract. I have sent a refill in

## 2018-03-19 NOTE — Telephone Encounter (Signed)
Informed patient of office policy and patient feels like she does not need to be seen.  Last OV 12/26/2017.  Patient rudely demanded for Hydrocodone Rx

## 2018-03-20 NOTE — Telephone Encounter (Signed)
Patient aware.

## 2018-04-20 ENCOUNTER — Other Ambulatory Visit: Payer: Self-pay | Admitting: Physician Assistant

## 2018-05-07 ENCOUNTER — Other Ambulatory Visit: Payer: Self-pay | Admitting: Physician Assistant

## 2018-05-07 DIAGNOSIS — J209 Acute bronchitis, unspecified: Secondary | ICD-10-CM

## 2018-05-17 ENCOUNTER — Other Ambulatory Visit: Payer: Self-pay | Admitting: Physician Assistant

## 2018-05-18 ENCOUNTER — Other Ambulatory Visit: Payer: Self-pay | Admitting: Physician Assistant

## 2018-05-18 MED ORDER — DILTIAZEM HCL ER COATED BEADS 180 MG PO CP24: ORAL_CAPSULE | ORAL | 0 refills | Status: DC

## 2018-05-18 NOTE — Telephone Encounter (Signed)
[place]. NTBS 30 days given 04/20/18. Last HTN OV 11/11/17. eleveated BP at last acute visit

## 2018-05-21 ENCOUNTER — Other Ambulatory Visit: Payer: Self-pay

## 2018-05-21 ENCOUNTER — Ambulatory Visit (INDEPENDENT_AMBULATORY_CARE_PROVIDER_SITE_OTHER): Payer: BLUE CROSS/BLUE SHIELD | Admitting: Physician Assistant

## 2018-05-21 DIAGNOSIS — N3000 Acute cystitis without hematuria: Secondary | ICD-10-CM | POA: Diagnosis not present

## 2018-05-21 DIAGNOSIS — R300 Dysuria: Secondary | ICD-10-CM

## 2018-05-21 LAB — URINALYSIS, COMPLETE
Bilirubin, UA: NEGATIVE
Glucose, UA: NEGATIVE
Leukocytes,UA: NEGATIVE
Nitrite, UA: NEGATIVE
Protein,UA: NEGATIVE
RBC, UA: NEGATIVE
Specific Gravity, UA: >1.03 — ABNORMAL HIGH (ref 1.005–1.030)
Urobilinogen, Ur: 0.2 mg/dL (ref 0.2–1.0)
pH, UA: 5.5 (ref 5.0–7.5)

## 2018-05-21 LAB — MICROSCOPIC EXAMINATION: Renal Epithel, UA: NONE SEEN /hpf

## 2018-05-22 ENCOUNTER — Encounter: Payer: Self-pay | Admitting: Physician Assistant

## 2018-05-22 NOTE — Progress Notes (Signed)
     Telephone visit  Subjective: CC: Possible UTI PCP: Terald Sleeper, PA-C EQA:[id number] [name] is a 58 y.o. female calls for telephone consult today. Patient provides verbal consent for consult held via phone.  Patient is identified with 2 separate identifiers.  At this time the entire area is on COVID-19 social distancing and stay home orders are in place.  Patient is of higher risk and therefore we are performing this by a virtual method.  Location of patient: work Location of provider: home Others present for call: no  Few months ago the patient did have a urinary tract infection that began with some dysuria and urgency.  It became quite severe and actually took several rounds of antibiotic to get her treated.  In the past couple days she has begun some urgency and wanted to have it checked as soon as possible.  She will come into the office for the urine evaluation.    ROS: Per HPI  Allergies  Allergen Reactions  . Lisinopril Swelling    Of lips   Past Medical History:  Diagnosis Date  . Anxiety   . Arthritis   . Constipation 02/17/2015  . Emphysema lung ([place])   . Fibromyalgia   . GERD (gastroesophageal reflux disease)   . Hypertension   . Lung cancer ([place])    pancoast tumor  . Menopausal vaginal dryness 02/17/2015    Current Outpatient Medications:  .  ALPRAZolam (XANAX) 0.5 MG tablet, Take 1 tablet (0.5 mg total) by mouth 2 (two) times daily as needed for anxiety., Disp: 30 tablet, Rfl: 1 .  diltiazem (CARDIZEM CD) 180 MG 24 hr capsule, TAKE 1 CAPSULE TWICE A DAY FOR BLOOD PRESSURE, Disp: 60 capsule, Rfl: 0 .  escitalopram (LEXAPRO) 10 MG tablet, Take 1 tablet (10 mg total) by mouth daily. (Needs to be seen before next refill), Disp: 30 tablet, Rfl: 11 .  esomeprazole (NEXIUM) 40 MG capsule, Take 1 capsule (40 mg total) by mouth daily at 12 noon., Disp: 30 capsule, Rfl: 11 .  HYDROcodone-acetaminophen (NORCO) 10-325 MG tablet, Take 2 tablets by mouth every 6 (six)  hours as needed., Disp: 40 tablet, Rfl: 0 .  ibuprofen (ADVIL,MOTRIN) 800 MG tablet, TAKE  (1)  TABLET  EVERY EIGHT HOURS AS NEEDED., Disp: 90 tablet, Rfl: 2 .  LORazepam (ATIVAN) 1 MG tablet, TAKE 1 TABLET AT BEDTIME AS NEEDED, Disp: 60 tablet, Rfl: 5 .  predniSONE (DELTASONE) 10 MG tablet, Take 1 tablet (10 mg total) by mouth 2 (two) times daily with a meal., Disp: 60 tablet, Rfl: 1 .  PROAIR HFA 108 (90 Base) MCG/ACT inhaler, 2 PUFFS EVERY 6 HOURS AS NEEDED FOR WHEEZING OR SHORTNESS OF BREATH, Disp: 8.5 g, Rfl: 1  Assessment/ Plan: 58 y.o. female   1. Acute cystitis without hematuria RECENT HISTORY - Urinalysis, Complete; Future - Urine Culture; Future - Urine Culture - Urinalysis, Complete - Microscopic Examination  2. Dysuria - Urinalysis, Complete; Future - Urine Culture; Future - Urine Culture - Urinalysis, Complete - Microscopic Examination   Start time: 11:18 AM End time: 11:24 AM  No orders of the defined types were placed in this encounter.   [name] PA-C [place] [phone]

## 2018-05-23 LAB — URINE CULTURE

## 2018-06-25 ENCOUNTER — Encounter: Payer: Self-pay | Admitting: Internal Medicine

## 2018-06-25 ENCOUNTER — Other Ambulatory Visit: Payer: Self-pay

## 2018-06-25 ENCOUNTER — Ambulatory Visit (INDEPENDENT_AMBULATORY_CARE_PROVIDER_SITE_OTHER): Payer: BLUE CROSS/BLUE SHIELD | Admitting: Physician Assistant

## 2018-06-25 DIAGNOSIS — F419 Anxiety disorder, unspecified: Secondary | ICD-10-CM

## 2018-06-25 DIAGNOSIS — M5137 Other intervertebral disc degeneration, lumbosacral region: Secondary | ICD-10-CM

## 2018-06-25 DIAGNOSIS — K219 Gastro-esophageal reflux disease without esophagitis: Secondary | ICD-10-CM | POA: Diagnosis not present

## 2018-06-25 DIAGNOSIS — I10 Essential (primary) hypertension: Secondary | ICD-10-CM | POA: Diagnosis not present

## 2018-06-25 DIAGNOSIS — M51379 Other intervertebral disc degeneration, lumbosacral region without mention of lumbar back pain or lower extremity pain: Secondary | ICD-10-CM

## 2018-06-25 MED ORDER — ALPRAZOLAM 0.5 MG PO TABS: 0.5000 mg | ORAL_TABLET | Freq: Every day | ORAL | 5 refills | Status: DC

## 2018-06-25 MED ORDER — DILTIAZEM HCL ER COATED BEADS 180 MG PO CP24: ORAL_CAPSULE | ORAL | 11 refills | Status: DC

## 2018-06-25 MED ORDER — HYDROCODONE-ACETAMINOPHEN 10-325 MG PO TABS: 2.0000 | ORAL_TABLET | Freq: Four times a day (QID) | ORAL | 0 refills | Status: DC | PRN

## 2018-06-26 ENCOUNTER — Encounter: Payer: Self-pay | Admitting: Physician Assistant

## 2018-06-26 NOTE — Progress Notes (Signed)
Telephone visit  Subjective: CC: Recheck on chronic medical conditions PCP: Terald Sleeper, PA-C [name] is a 58 y.o. female calls for telephone consult today. Patient provides verbal consent for consult held via phone.  Patient is identified with 2 separate identifiers.  At this time the entire area is on COVID-19 social distancing and stay home orders are in place.  Patient is of higher risk and therefore we are performing this by a virtual method.  Location of patient: Work Location of provider: HOME Others present for call: None  This is a periodic recheck on patient's multiple medical conditions.  First  She is having significant amount of GERD problems still at times.  She is taking her medication regularly however at times she will have breakthrough reflux.  She will have a lot of nighttime symptoms.  She reports that many years ago she did have an esophageal stricture that had had dilation.  She was seen by Dr. Lajuan Lines.  I do think we need to get her referred back to gastro at this time.  She also has hypertension that has been well controlled.  She does need refills.  Also her degenerative disc disease has been doing fairly well.  She tries to stay active and only on occasion uses the pain medication.  One prescription will usually last for a whole year.  She also has some anxiety that is well controlled at this time.   ROS: Per HPI  Allergies  Allergen Reactions  . Lisinopril Swelling    Of lips   Past Medical History:  Diagnosis Date  . Anxiety   . Arthritis   . Constipation 02/17/2015  . Emphysema lung ([place])   . Fibromyalgia   . GERD (gastroesophageal reflux disease)   . Hypertension   . Lung cancer ([place])    pancoast tumor  . Menopausal vaginal dryness 02/17/2015    Current Outpatient Medications:  .  ALPRAZolam (XANAX) 0.5 MG tablet, Take 1 tablet (0.5 mg total) by mouth at bedtime., Disp: 30 tablet, Rfl: 5 .  diltiazem (CARDIZEM CD) 180 MG 24  hr capsule, TAKE 1 CAPSULE TWICE A DAY FOR BLOOD PRESSURE, Disp: 60 capsule, Rfl: 11 .  escitalopram (LEXAPRO) 10 MG tablet, Take 1 tablet (10 mg total) by mouth daily. (Needs to be seen before next refill), Disp: 30 tablet, Rfl: 11 .  esomeprazole (NEXIUM) 40 MG capsule, Take 1 capsule (40 mg total) by mouth daily at 12 noon., Disp: 30 capsule, Rfl: 11 .  HYDROcodone-acetaminophen (NORCO) 10-325 MG tablet, Take 2 tablets by mouth every 6 (six) hours as needed., Disp: 40 tablet, Rfl: 0 .  ibuprofen (ADVIL,MOTRIN) 800 MG tablet, TAKE  (1)  TABLET  EVERY EIGHT HOURS AS NEEDED., Disp: 90 tablet, Rfl: 2 .  LORazepam (ATIVAN) 1 MG tablet, TAKE 1 TABLET AT BEDTIME AS NEEDED, Disp: 60 tablet, Rfl: 5 .  predniSONE (DELTASONE) 10 MG tablet, Take 1 tablet (10 mg total) by mouth 2 (two) times daily with a meal., Disp: 60 tablet, Rfl: 1 .  PROAIR HFA 108 (90 Base) MCG/ACT inhaler, 2 PUFFS EVERY 6 HOURS AS NEEDED FOR WHEEZING OR SHORTNESS OF BREATH, Disp: 8.5 g, Rfl: 1  Assessment/ Plan: 58 y.o. female   1. Gastroesophageal reflux disease without esophagitis - Ambulatory referral to Gastroenterology  2. Essential hypertension - diltiazem (CARDIZEM CD) 180 MG 24 hr capsule; TAKE 1 CAPSULE TWICE A DAY FOR BLOOD PRESSURE  Dispense: 60 capsule; Refill: 11  3. Degeneration of  lumbar or lumbosacral intervertebral disc - HYDROcodone-acetaminophen (NORCO) 10-325 MG tablet; Take 2 tablets by mouth every 6 (six) hours as needed.  Dispense: 40 tablet; Refill: 0  4. Anxiety - ALPRAZolam (XANAX) 0.5 MG tablet; Take 1 tablet (0.5 mg total) by mouth at bedtime.  Dispense: 30 tablet; Refill: 5   Start time: 9:13 AM End time: 9:24 AM  Meds ordered this encounter  Medications  . diltiazem (CARDIZEM CD) 180 MG 24 hr capsule    Sig: TAKE 1 CAPSULE TWICE A DAY FOR BLOOD PRESSURE    Dispense:  60 capsule    Refill:  11    Order Specific Question:   Supervising Provider    Answer:   Janora Norlander [[id number]]  .  HYDROcodone-acetaminophen (NORCO) 10-325 MG tablet    Sig: Take 2 tablets by mouth every 6 (six) hours as needed.    Dispense:  40 tablet    Refill:  0    Order Specific Question:   Supervising Provider    Answer:   Janora Norlander [[id number]]  . ALPRAZolam (XANAX) 0.5 MG tablet    Sig: Take 1 tablet (0.5 mg total) by mouth at bedtime.    Dispense:  30 tablet    Refill:  5    Order Specific Question:   Supervising Provider    Answer:   Janora Norlander [[id number]]    [name] PA-C [place] [phone]

## 2018-07-26 ENCOUNTER — Telehealth: Payer: BC Managed Care – PPO | Admitting: Family

## 2018-07-26 DIAGNOSIS — R300 Dysuria: Secondary | ICD-10-CM | POA: Diagnosis not present

## 2018-07-26 MED ORDER — NITROFURANTOIN MONOHYD MACRO 100 MG PO CAPS: 100.0000 mg | ORAL_CAPSULE | Freq: Two times a day (BID) | ORAL | 0 refills | Status: DC

## 2018-07-26 NOTE — Progress Notes (Signed)

## 2018-07-27 ENCOUNTER — Telehealth: Payer: Self-pay | Admitting: Physician Assistant

## 2018-07-27 ENCOUNTER — Other Ambulatory Visit: Payer: Self-pay

## 2018-07-27 ENCOUNTER — Other Ambulatory Visit: Payer: BC Managed Care – PPO

## 2018-07-27 DIAGNOSIS — R300 Dysuria: Secondary | ICD-10-CM

## 2018-07-27 LAB — URINALYSIS, COMPLETE
Bilirubin, UA: POSITIVE — AB
Glucose, UA: NEGATIVE
Leukocytes,UA: NEGATIVE
Nitrite, UA: NEGATIVE
Specific Gravity, UA: 1.025 (ref 1.005–1.030)
Urobilinogen, Ur: 0.2 mg/dL (ref 0.2–1.0)
pH, UA: 5.5 (ref 5.0–7.5)

## 2018-07-27 LAB — MICROSCOPIC EXAMINATION
Epithelial Cells (non renal): >10 /hpf — AB (ref 0–10)
RBC, Urine: >30 /hpf — AB (ref 0–2)
Renal Epithel, UA: NONE SEEN /hpf

## 2018-07-30 ENCOUNTER — Encounter: Payer: Self-pay | Admitting: *Deleted

## 2018-07-31 LAB — URINE CULTURE

## 2018-08-01 ENCOUNTER — Encounter: Payer: Self-pay | Admitting: Physician Assistant

## 2018-08-29 ENCOUNTER — Ambulatory Visit: Payer: BLUE CROSS/BLUE SHIELD | Admitting: Nurse Practitioner

## 2018-08-29 ENCOUNTER — Encounter

## 2018-09-07 ENCOUNTER — Encounter: Payer: Self-pay | Admitting: Physician Assistant

## 2018-09-07 ENCOUNTER — Ambulatory Visit: Payer: BC Managed Care – PPO | Admitting: Family Medicine

## 2018-11-30 ENCOUNTER — Other Ambulatory Visit: Payer: Self-pay

## 2018-11-30 ENCOUNTER — Encounter: Payer: Self-pay | Admitting: Physician Assistant

## 2018-11-30 ENCOUNTER — Ambulatory Visit (INDEPENDENT_AMBULATORY_CARE_PROVIDER_SITE_OTHER): Payer: BC Managed Care – PPO | Admitting: Physician Assistant

## 2018-11-30 VITALS — BP 145/84 | HR 57 | Temp 98.2°F | Ht 64.0 in | Wt 140.2 lb

## 2018-11-30 DIAGNOSIS — M5137 Other intervertebral disc degeneration, lumbosacral region: Secondary | ICD-10-CM | POA: Diagnosis not present

## 2018-11-30 DIAGNOSIS — R413 Other amnesia: Secondary | ICD-10-CM | POA: Diagnosis not present

## 2018-11-30 DIAGNOSIS — R0600 Dyspnea, unspecified: Secondary | ICD-10-CM

## 2018-11-30 DIAGNOSIS — I10 Essential (primary) hypertension: Secondary | ICD-10-CM

## 2018-11-30 DIAGNOSIS — Z818 Family history of other mental and behavioral disorders: Secondary | ICD-10-CM

## 2018-11-30 DIAGNOSIS — Z85118 Personal history of other malignant neoplasm of bronchus and lung: Secondary | ICD-10-CM | POA: Diagnosis not present

## 2018-11-30 DIAGNOSIS — Z1159 Encounter for screening for other viral diseases: Secondary | ICD-10-CM

## 2018-11-30 MED ORDER — BUDESONIDE-FORMOTEROL FUMARATE 80-4.5 MCG/ACT IN AERO: 2.0000 | INHALATION_SPRAY | Freq: Two times a day (BID) | RESPIRATORY_TRACT | 5 refills | Status: DC

## 2018-11-30 MED ORDER — HYDROCODONE-ACETAMINOPHEN 10-325 MG PO TABS: 2.0000 | ORAL_TABLET | Freq: Four times a day (QID) | ORAL | 0 refills | Status: DC | PRN

## 2018-12-02 NOTE — Progress Notes (Signed)
BP (!) 145/84   Pulse (!) 57   Temp 98.2 F (36.8 C) (Temporal)   Ht _0  (1.626 m)   Wt 140 lb 3.2 oz (63.6 kg)   SpO2 98%   BMI 24.07 kg/m    Subjective:    Patient ID: [name], female    DOB: 01-25-1961, 58 y.o.   MRN: [id number]  HPI: [name] is a 58 y.o. female presenting on 11/30/2018 for Shortness of Breath; Memory Loss; and arm, shoulder, and back pain  This patient is in reviewing her chronic medical conditions which do include a history of lung cancer and then there for some shortness of breath and wheezing.  She states it has been a little more pronounced recently.  She will get winded whenever she walks a lot.  She has used albuterol rescue inhaler in the past but it does make her feel quite shaky.  We have talked about using a controller to see if we can control what is going on.    Patient also has increased amount of memory loss and decrease in concentration.  She is she can never keep things straight anymore.  She has a hard time with finding words.  Even her family has noticed a difference in her.  She does have a family history of vascular dementia in her father, and Alzheimer's.  Her sisters have had strokes.  She also has chronic back pain from degenerative disc disease in her back and old injuries.  All of her medications are reviewed today.  Past Medical History:  Diagnosis Date  . Anxiety   . Arthritis   . Constipation 02/17/2015  . Emphysema lung ([place])   . Fibromyalgia   . GERD (gastroesophageal reflux disease)   . Hypertension   . Lung cancer ([place])    pancoast tumor  . Menopausal vaginal dryness 02/17/2015   Relevant past medical, surgical, family and social history reviewed and updated as indicated. Interim medical history since our last visit reviewed. Allergies and medications reviewed and updated. DATA REVIEWED: CHART IN EPIC  Family History reviewed for pertinent findings.  Review of Systems  Constitutional: Negative.    HENT: Negative.   Eyes: Negative.   Respiratory: Positive for shortness of breath and wheezing.   Gastrointestinal: Negative.   Genitourinary: Negative.   Musculoskeletal: Positive for arthralgias, neck pain and neck stiffness.  Neurological: Negative for dizziness, facial asymmetry, weakness and headaches.  Psychiatric/Behavioral: Positive for confusion and decreased concentration. Negative for behavioral problems, sleep disturbance and suicidal ideas. The patient is not nervous/anxious.     Allergies as of 11/30/2018      Reactions   Lisinopril Swelling   Of lips   Nitrofuran Derivatives       Medication List       Accurate as of November 30, 2018 11:59 PM. If you have any questions, ask your nurse or doctor.        STOP taking these medications   LORazepam 1 MG tablet Commonly known as: ATIVAN Stopped by: Terald Sleeper, PA-C   nitrofurantoin (macrocrystal-monohydrate) 100 MG capsule Commonly known as: Macrobid Stopped by: Terald Sleeper, PA-C   predniSONE 10 MG tablet Commonly known as: DELTASONE Stopped by: Terald Sleeper, PA-C     TAKE these medications   ALPRAZolam 0.5 MG tablet Commonly known as: XANAX Take 1 tablet (0.5 mg total) by mouth at bedtime.   budesonide-formoterol 80-4.5 MCG/ACT inhaler Commonly known as: Symbicort Inhale 2 puffs into the  lungs 2 (two) times daily. Started by: Terald Sleeper, PA-C   diltiazem 180 MG 24 hr capsule Commonly known as: CARDIZEM CD TAKE 1 CAPSULE TWICE A DAY FOR BLOOD PRESSURE   escitalopram 10 MG tablet Commonly known as: LEXAPRO Take 1 tablet (10 mg total) by mouth daily. (Needs to be seen before next refill)   esomeprazole 40 MG capsule Commonly known as: NEXIUM Take 1 capsule (40 mg total) by mouth daily at 12 noon.   HYDROcodone-acetaminophen 10-325 MG tablet Commonly known as: NORCO Take 2 tablets by mouth every 6 (six) hours as needed.   ibuprofen 800 MG tablet Commonly known as: ADVIL TAKE  (1)   TABLET  EVERY EIGHT HOURS AS NEEDED.   ProAir HFA 108 (90 Base) MCG/ACT inhaler Generic drug: albuterol 2 PUFFS EVERY 6 HOURS AS NEEDED FOR WHEEZING OR SHORTNESS OF BREATH          Objective:    BP (!) 145/84   Pulse (!) 57   Temp 98.2 F (36.8 C) (Temporal)   Ht _0  (1.626 m)   Wt 140 lb 3.2 oz (63.6 kg)   SpO2 98%   BMI 24.07 kg/m   Allergies  Allergen Reactions  . Lisinopril Swelling    Of lips  . Nitrofuran Derivatives     Wt Readings from Last 3 Encounters:  11/30/18 140 lb 3.2 oz (63.6 kg)  02/13/18 149 lb (67.6 kg)  12/26/17 142 lb (64.4 kg)    Physical Exam Constitutional:      Appearance: She is well-developed.  HENT:     Head: Normocephalic and atraumatic.     Right Ear: Tympanic membrane, ear canal and external ear normal.     Left Ear: Tympanic membrane, ear canal and external ear normal.     Nose: Nose normal. No rhinorrhea.     Mouth/Throat:     Pharynx: No oropharyngeal exudate or posterior oropharyngeal erythema.  Eyes:     Conjunctiva/sclera: Conjunctivae normal.     Pupils: Pupils are equal, round, and reactive to light.  Neck:     Musculoskeletal: Normal range of motion and neck supple.  Cardiovascular:     Rate and Rhythm: Normal rate and regular rhythm.     Heart sounds: Normal heart sounds.  Pulmonary:     Effort: Pulmonary effort is normal.     Breath sounds: Normal breath sounds.  Abdominal:     General: Bowel sounds are normal.     Palpations: Abdomen is soft.  Skin:    General: Skin is warm and dry.     Findings: No rash.  Neurological:     Mental Status: She is alert and oriented to person, place, and time.     Deep Tendon Reflexes: Reflexes are normal and symmetric.  Psychiatric:        Behavior: Behavior normal.        Thought Content: Thought content normal.        Judgment: Judgment normal.     Results for orders placed or performed in visit on 11/30/18  Hepatitis C antibody  Result Value Ref Range   Hep C Virus  Ab WILL FOLLOW   CBC with Differential/Platelet  Result Value Ref Range   WBC 6.3 3.4 - 10.8 x10E3/uL   RBC 4.78 3.77 - 5.28 x10E6/uL   Hemoglobin 13.7 11.1 - 15.9 g/dL   Hematocrit 39.8 34.0 - 46.6 %   MCV 83 79 - 97 fL   MCH 28.7 26.6 - 33.0  pg   MCHC 34.4 31.5 - 35.7 g/dL   RDW 12.0 11.7 - 15.4 %   Platelets 355 150 - 450 x10E3/uL   Neutrophils 53 Not Estab. %   Lymphs 26 Not Estab. %   Monocytes 9 Not Estab. %   Eos 10 Not Estab. %   Basos 2 Not Estab. %   Neutrophils Absolute 3.3 1.4 - 7.0 x10E3/uL   Lymphocytes Absolute 1.7 0.7 - 3.1 x10E3/uL   Monocytes Absolute 0.6 0.1 - 0.9 x10E3/uL   EOS (ABSOLUTE) 0.7 (H) 0.0 - 0.4 x10E3/uL   Basophils Absolute 0.1 0.0 - 0.2 x10E3/uL   Immature Granulocytes 0 Not Estab. %   Immature Grans (Abs) 0.0 0.0 - 0.1 x10E3/uL  CMP14+EGFR  Result Value Ref Range   Glucose 92 65 - 99 mg/dL   BUN 17 6 - 24 mg/dL   Creatinine, Ser 0.93 0.57 - 1.00 mg/dL   GFR calc non Af Amer 67 >59 mL/min/1.73   GFR calc Af Amer 78 >59 mL/min/1.73   BUN/Creatinine Ratio 18 9 - 23   Sodium 141 134 - 144 mmol/L   Potassium 3.9 3.5 - 5.2 mmol/L   Chloride 102 96 - 106 mmol/L   CO2 25 20 - 29 mmol/L   Calcium 9.3 8.7 - 10.2 mg/dL   Total Protein 6.8 6.0 - 8.5 g/dL   Albumin 4.4 3.8 - 4.9 g/dL   Globulin, Total 2.4 1.5 - 4.5 g/dL   Albumin/Globulin Ratio 1.8 1.2 - 2.2   Bilirubin Total 0.3 0.0 - 1.2 mg/dL   Alkaline Phosphatase 179 (H) 39 - 117 IU/L   AST 32 0 - 40 IU/L   ALT 35 (H) 0 - 32 IU/L  Lipid Panel  Result Value Ref Range   Cholesterol, Total 193 100 - 199 mg/dL   Triglycerides 168 (H) 0 - 149 mg/dL   HDL 63 >39 mg/dL   VLDL Cholesterol Cal 29 5 - 40 mg/dL   LDL Chol Calc (NIH) 101 (H) 0 - 99 mg/dL   Chol/HDL Ratio 3.1 0.0 - 4.4 ratio  TSH  Result Value Ref Range   TSH 1.920 0.450 - 4.500 uIU/mL      Assessment & Plan:   1. Memory loss of unknown cause - CBC with Differential/Platelet - CMP14+EGFR - Lipid Panel - TSH - Ambulatory  referral to Neurology  2. Degeneration of lumbar or lumbosacral intervertebral disc - HYDROcodone-acetaminophen (NORCO) 10-325 MG tablet; Take 2 tablets by mouth every 6 (six) hours as needed.  Dispense: 40 tablet; Refill: 0  3. Essential hypertension - CBC with Differential/Platelet - CMP14+EGFR - Lipid Panel - TSH  4. History of lung cancer - CBC with Differential/Platelet - CMP14+EGFR - Lipid Panel - TSH  5. Encounter for hepatitis C screening test for low risk patient - Hepatitis C antibody  6. Dyspnea, unspecified type - budesonide-formoterol (SYMBICORT) 80-4.5 MCG/ACT inhaler; Inhale 2 puffs into the lungs 2 (two) times daily.  Dispense: 1 Inhaler; Refill: 5  7. Family history of dementia - Ambulatory referral to Neurology   Continue all other maintenance medications as listed above.  Follow up plan: No follow-ups on file.  Educational handout given for [place] PA-C [place] [address]  [place], [place] [address] [phone]   12/02/2018, 10:21 PM

## 2018-12-04 LAB — LIPID PANEL
Chol/HDL Ratio: 3.1 ratio (ref 0.0–4.4)
Cholesterol, Total: 193 mg/dL (ref 100–199)
HDL: 63 mg/dL (ref >39)
LDL Chol Calc (NIH): 101 mg/dL — ABNORMAL HIGH (ref 0–99)
Triglycerides: 168 mg/dL — ABNORMAL HIGH (ref 0–149)
VLDL Cholesterol Cal: 29 mg/dL (ref 5–40)

## 2018-12-04 LAB — CBC WITH DIFFERENTIAL/PLATELET
Basophils Absolute: 0.1 10*3/uL (ref 0.0–0.2)
Basos: 2 %
EOS (ABSOLUTE): 0.7 10*3/uL — ABNORMAL HIGH (ref 0.0–0.4)
Eos: 10 %
Hematocrit: 39.8 % (ref 34.0–46.6)
Hemoglobin: 13.7 g/dL (ref 11.1–15.9)
Immature Grans (Abs): 0 10*3/uL (ref 0.0–0.1)
Immature Granulocytes: 0 %
Lymphocytes Absolute: 1.7 10*3/uL (ref 0.7–3.1)
Lymphs: 26 %
MCH: 28.7 pg (ref 26.6–33.0)
MCHC: 34.4 g/dL (ref 31.5–35.7)
MCV: 83 fL (ref 79–97)
Monocytes Absolute: 0.6 10*3/uL (ref 0.1–0.9)
Monocytes: 9 %
Neutrophils Absolute: 3.3 10*3/uL (ref 1.4–7.0)
Neutrophils: 53 %
Platelets: 355 10*3/uL (ref 150–450)
RBC: 4.78 x10E6/uL (ref 3.77–5.28)
RDW: 12 % (ref 11.7–15.4)
WBC: 6.3 10*3/uL (ref 3.4–10.8)

## 2018-12-04 LAB — CMP14+EGFR
ALT: 35 IU/L — ABNORMAL HIGH (ref 0–32)
AST: 32 IU/L (ref 0–40)
Albumin/Globulin Ratio: 1.8 (ref 1.2–2.2)
Albumin: 4.4 g/dL (ref 3.8–4.9)
Alkaline Phosphatase: 179 IU/L — ABNORMAL HIGH (ref 39–117)
BUN/Creatinine Ratio: 18 (ref 9–23)
BUN: 17 mg/dL (ref 6–24)
Bilirubin Total: 0.3 mg/dL (ref 0.0–1.2)
CO2: 25 mmol/L (ref 20–29)
Calcium: 9.3 mg/dL (ref 8.7–10.2)
Chloride: 102 mmol/L (ref 96–106)
Creatinine, Ser: 0.93 mg/dL (ref 0.57–1.00)
GFR calc Af Amer: 78 mL/min/{1.73_m2} (ref >59)
GFR calc non Af Amer: 67 mL/min/{1.73_m2} (ref >59)
Globulin, Total: 2.4 g/dL (ref 1.5–4.5)
Glucose: 92 mg/dL (ref 65–99)
Potassium: 3.9 mmol/L (ref 3.5–5.2)
Sodium: 141 mmol/L (ref 134–144)
Total Protein: 6.8 g/dL (ref 6.0–8.5)

## 2018-12-04 LAB — TSH: TSH: 1.92 u[IU]/mL (ref 0.450–4.500)

## 2018-12-04 LAB — HEPATITIS C ANTIBODY: Hep C Virus Ab: <0.1 s/co ratio (ref 0.0–0.9)

## 2018-12-06 ENCOUNTER — Telehealth: Payer: Self-pay | Admitting: Physician Assistant

## 2018-12-06 NOTE — Telephone Encounter (Signed)
Returned patients call about COVID questions.

## 2018-12-18 ENCOUNTER — Telehealth: Payer: Self-pay | Admitting: Acute Care

## 2018-12-19 NOTE — Telephone Encounter (Signed)
[name], pt is returning your call to schedule yearly ct.

## 2018-12-24 ENCOUNTER — Other Ambulatory Visit: Payer: Self-pay

## 2018-12-24 NOTE — Telephone Encounter (Signed)
Pt left msg in MyChart and would like to sched an appt to have a low dose Cat scan done. If someone could give her a call back

## 2018-12-24 NOTE — Telephone Encounter (Signed)
I spoke with [name] today and her LCS CT has been scheduled on 01/08/2019 @ 11:30am at [place] location Patient was already coming to town on this day for another appt

## 2018-12-25 ENCOUNTER — Encounter: Payer: Self-pay | Admitting: Physician Assistant

## 2018-12-25 ENCOUNTER — Other Ambulatory Visit: Payer: Self-pay

## 2018-12-25 ENCOUNTER — Ambulatory Visit (INDEPENDENT_AMBULATORY_CARE_PROVIDER_SITE_OTHER): Payer: BC Managed Care – PPO | Admitting: Physician Assistant

## 2018-12-25 VITALS — BP 151/84 | HR 66 | Temp 97.8°F | Ht 64.0 in | Wt 145.4 lb

## 2018-12-25 DIAGNOSIS — Z01419 Encounter for gynecological examination (general) (routine) without abnormal findings: Secondary | ICD-10-CM

## 2018-12-25 DIAGNOSIS — Z01411 Encounter for gynecological examination (general) (routine) with abnormal findings: Secondary | ICD-10-CM

## 2018-12-25 DIAGNOSIS — F419 Anxiety disorder, unspecified: Secondary | ICD-10-CM

## 2018-12-25 DIAGNOSIS — E7800 Pure hypercholesterolemia, unspecified: Secondary | ICD-10-CM | POA: Diagnosis not present

## 2018-12-25 MED ORDER — ALPRAZOLAM 0.5 MG PO TABS: 0.5000 mg | ORAL_TABLET | Freq: Every day | ORAL | 5 refills | Status: DC

## 2018-12-26 NOTE — Progress Notes (Signed)
BP (!) 151/84   Pulse 66   Temp 97.8 F (36.6 C) (Temporal)   Ht _0  (1.626 m)   Wt 145 lb 6.4 oz (66 kg)   SpO2 98%   BMI 24.96 kg/m    Subjective:    Patient ID: [name], female    DOB: October 19, 1960, 58 y.o.   MRN: [id number]  HPI: MIIA BLANKS is a 58 y.o. female presenting on 12/25/2018 for Annual Exam (WITH PAP)  This patient comes in for annual well physical examination. All medications are reviewed today. There are no reports of any problems with the medications. All of the medical conditions are reviewed and updated.  Lab work is reviewed and will be ordered as medically necessary. There are no new problems reported with today's visit.  Patient reports doing well overall.   Past Medical History:  Diagnosis Date  . Anxiety   . Arthritis   . Constipation 02/17/2015  . Emphysema lung ([place])   . Fibromyalgia   . GERD (gastroesophageal reflux disease)   . Hypertension   . Lung cancer ([place])    pancoast tumor  . Menopausal vaginal dryness 02/17/2015   Relevant past medical, surgical, family and social history reviewed and updated as indicated. Interim medical history since our last visit reviewed. Allergies and medications reviewed and updated. DATA REVIEWED: CHART IN EPIC  Family History reviewed for pertinent findings.  Review of Systems  Constitutional: Negative.  Negative for activity change, fatigue and fever.  HENT: Negative.   Eyes: Negative.   Respiratory: Negative.  Negative for cough.   Cardiovascular: Negative.  Negative for chest pain.  Gastrointestinal: Negative.  Negative for abdominal pain.  Endocrine: Negative.   Genitourinary: Negative.  Negative for dysuria.  Musculoskeletal: Negative.   Skin: Negative.   Neurological: Negative.     Allergies as of 12/25/2018      Reactions   Lisinopril Swelling   Of lips   Nitrofuran Derivatives       Medication List       Accurate as of December 25, 2018 11:59 PM. If you have any  questions, ask your nurse or doctor.        ALPRAZolam 0.5 MG tablet Commonly known as: XANAX Take 1 tablet (0.5 mg total) by mouth at bedtime.   budesonide-formoterol 80-4.5 MCG/ACT inhaler Commonly known as: Symbicort Inhale 2 puffs into the lungs 2 (two) times daily.   diltiazem 180 MG 24 hr capsule Commonly known as: CARDIZEM CD TAKE 1 CAPSULE TWICE A DAY FOR BLOOD PRESSURE   escitalopram 10 MG tablet Commonly known as: LEXAPRO Take 1 tablet (10 mg total) by mouth daily. (Needs to be seen before next refill)   esomeprazole 40 MG capsule Commonly known as: NEXIUM Take 1 capsule (40 mg total) by mouth daily at 12 noon.   HYDROcodone-acetaminophen 10-325 MG tablet Commonly known as: NORCO Take 2 tablets by mouth every 6 (six) hours as needed.   ibuprofen 800 MG tablet Commonly known as: ADVIL TAKE  (1)  TABLET  EVERY EIGHT HOURS AS NEEDED.   ProAir HFA 108 (90 Base) MCG/ACT inhaler Generic drug: albuterol 2 PUFFS EVERY 6 HOURS AS NEEDED FOR WHEEZING OR SHORTNESS OF BREATH          Objective:    BP (!) 151/84   Pulse 66   Temp 97.8 F (36.6 C) (Temporal)   Ht _1  (1.626 m)   Wt 145 lb 6.4 oz (66 kg)   SpO2 98%  BMI 24.96 kg/m   Allergies  Allergen Reactions  . Lisinopril Swelling    Of lips  . Nitrofuran Derivatives     Wt Readings from Last 3 Encounters:  12/25/18 145 lb 6.4 oz (66 kg)  11/30/18 140 lb 3.2 oz (63.6 kg)  02/13/18 149 lb (67.6 kg)    Physical Exam Constitutional:      Appearance: She is well-developed.  HENT:     Head: Normocephalic and atraumatic.  Eyes:     Conjunctiva/sclera: Conjunctivae normal.     Pupils: Pupils are equal, round, and reactive to light.  Neck:     Musculoskeletal: Normal range of motion and neck supple.  Cardiovascular:     Rate and Rhythm: Normal rate and regular rhythm.     Heart sounds: Normal heart sounds.  Pulmonary:     Effort: Pulmonary effort is normal.     Breath sounds: Normal breath  sounds.  Chest:     Breasts: Breasts are symmetrical.        Right: No mass, skin change or tenderness.        Left: No mass, skin change or tenderness.       Comments: Scar from lung cancer treatment Abdominal:     General: Bowel sounds are normal.     Palpations: Abdomen is soft.  Genitourinary:    Labia:        Right: No tenderness or lesion.        Left: No tenderness or lesion.      Vagina: Normal. No vaginal discharge, tenderness or bleeding.     Cervix: No cervical motion tenderness, discharge or friability.     Uterus: Not deviated, not enlarged and not tender.      Adnexa:        Right: No mass, tenderness or fullness.         Left: No mass, tenderness or fullness.       Rectum: No anal fissure.  Skin:    General: Skin is warm and dry.     Findings: No rash.  Neurological:     Mental Status: She is alert and oriented to person, place, and time.     Deep Tendon Reflexes: Reflexes are normal and symmetric.  Psychiatric:        Behavior: Behavior normal.        Thought Content: Thought content normal.        Judgment: Judgment normal.     Results for orders placed or performed in visit on 11/30/18  Hepatitis C antibody  Result Value Ref Range   Hep C Virus Ab <0.1 0.0 - 0.9 s/co ratio  CBC with Differential/Platelet  Result Value Ref Range   WBC 6.3 3.4 - 10.8 x10E3/uL   RBC 4.78 3.77 - 5.28 x10E6/uL   Hemoglobin 13.7 11.1 - 15.9 g/dL   Hematocrit 39.8 34.0 - 46.6 %   MCV 83 79 - 97 fL   MCH 28.7 26.6 - 33.0 pg   MCHC 34.4 31.5 - 35.7 g/dL   RDW 12.0 11.7 - 15.4 %   Platelets 355 150 - 450 x10E3/uL   Neutrophils 53 Not Estab. %   Lymphs 26 Not Estab. %   Monocytes 9 Not Estab. %   Eos 10 Not Estab. %   Basos 2 Not Estab. %   Neutrophils Absolute 3.3 1.4 - 7.0 x10E3/uL   Lymphocytes Absolute 1.7 0.7 - 3.1 x10E3/uL   Monocytes Absolute 0.6 0.1 - 0.9 x10E3/uL   EOS (  ABSOLUTE) 0.7 (H) 0.0 - 0.4 x10E3/uL   Basophils Absolute 0.1 0.0 - 0.2 x10E3/uL    Immature Granulocytes 0 Not Estab. %   Immature Grans (Abs) 0.0 0.0 - 0.1 x10E3/uL  CMP14+EGFR  Result Value Ref Range   Glucose 92 65 - 99 mg/dL   BUN 17 6 - 24 mg/dL   Creatinine, Ser 0.93 0.57 - 1.00 mg/dL   GFR calc non Af Amer 67 >59 mL/min/1.73   GFR calc Af Amer 78 >59 mL/min/1.73   BUN/Creatinine Ratio 18 9 - 23   Sodium 141 134 - 144 mmol/L   Potassium 3.9 3.5 - 5.2 mmol/L   Chloride 102 96 - 106 mmol/L   CO2 25 20 - 29 mmol/L   Calcium 9.3 8.7 - 10.2 mg/dL   Total Protein 6.8 6.0 - 8.5 g/dL   Albumin 4.4 3.8 - 4.9 g/dL   Globulin, Total 2.4 1.5 - 4.5 g/dL   Albumin/Globulin Ratio 1.8 1.2 - 2.2   Bilirubin Total 0.3 0.0 - 1.2 mg/dL   Alkaline Phosphatase 179 (H) 39 - 117 IU/L   AST 32 0 - 40 IU/L   ALT 35 (H) 0 - 32 IU/L  Lipid Panel  Result Value Ref Range   Cholesterol, Total 193 100 - 199 mg/dL   Triglycerides 168 (H) 0 - 149 mg/dL   HDL 63 >39 mg/dL   VLDL Cholesterol Cal 29 5 - 40 mg/dL   LDL Chol Calc (NIH) 101 (H) 0 - 99 mg/dL   Chol/HDL Ratio 3.1 0.0 - 4.4 ratio  TSH  Result Value Ref Range   TSH 1.920 0.450 - 4.500 uIU/mL      Assessment & Plan:   1. Well female exam with routine gynecological exam - PAP age 30-65  2. Elevated cholesterol - CMP14+EGFR; Future - Lipid Panel; Future  3. Anxiety - ALPRAZolam (XANAX) 0.5 MG tablet; Take 1 tablet (0.5 mg total) by mouth at bedtime.  Dispense: 30 tablet; Refill: 5   Continue all other maintenance medications as listed above.  Follow up plan: Return in about 6 months (around 06/25/2019) for lab, recheck medications.  Educational handout given for health maintenance  Terald Sleeper PA-C [place] [place] [address]  [place], [place] [address] [phone]   12/26/2018, 1:12 PM

## 2018-12-31 LAB — IGP, APTIMA HPV, RFX 16/18,45: HPV Aptima: NEGATIVE

## 2019-01-03 ENCOUNTER — Other Ambulatory Visit: Payer: Self-pay | Admitting: Physician Assistant

## 2019-01-03 ENCOUNTER — Telehealth: Payer: Self-pay | Admitting: Physician Assistant

## 2019-01-03 NOTE — Telephone Encounter (Signed)
It was 6 months ago when we went to alprazolam.  Does she want to change back to the lorazepam?

## 2019-01-07 ENCOUNTER — Other Ambulatory Visit: Payer: Self-pay | Admitting: Physician Assistant

## 2019-01-08 ENCOUNTER — Other Ambulatory Visit: Payer: Self-pay | Admitting: Physician Assistant

## 2019-01-08 ENCOUNTER — Telehealth: Payer: Self-pay | Admitting: Family Medicine

## 2019-01-08 ENCOUNTER — Encounter

## 2019-01-08 ENCOUNTER — Other Ambulatory Visit: Payer: Self-pay

## 2019-01-08 ENCOUNTER — Ambulatory Visit: Payer: BC Managed Care – PPO | Admitting: Neurology

## 2019-01-08 ENCOUNTER — Ambulatory Visit
Admission: RE | Admit: 2019-01-08 | Discharge: 2019-01-08 | Disposition: A | Discharge status: Home / Self Care | Payer: BC Managed Care – PPO | Source: Ambulatory Visit | Attending: Acute Care | Admitting: Acute Care

## 2019-01-08 DIAGNOSIS — Z122 Encounter for screening for malignant neoplasm of respiratory organs: Secondary | ICD-10-CM

## 2019-01-08 DIAGNOSIS — Z87891 Personal history of nicotine dependence: Secondary | ICD-10-CM

## 2019-01-08 MED ORDER — LORAZEPAM 1 MG PO TABS: 1.0000 mg | ORAL_TABLET | Freq: Every day | ORAL | 5 refills | Status: DC

## 2019-01-08 NOTE — Telephone Encounter (Signed)
All people as they age have calcifications of their arteries over time.  If there was an aneurysm (blockage) that is where we get concerned.  Otherwise keeping BP down and controlling diet and exercise and cholesterol helps every thing.

## 2019-01-08 NOTE — Telephone Encounter (Signed)
Patient states she takes the Lorazepam for sleep. Patient is not taking the xanax.

## 2019-01-08 NOTE — Telephone Encounter (Signed)
Lmtcb.

## 2019-01-08 NOTE — Telephone Encounter (Signed)
Upper Abdomen: Visualized upper abdomen is notable for vascular calcifications and prior cholecystectomy. This is the part she is really worried about

## 2019-01-08 NOTE — Telephone Encounter (Signed)
Sent the lorazepam, cancel any alprazolam that may be on file

## 2019-01-08 NOTE — Telephone Encounter (Signed)
Ledt detailed message

## 2019-01-08 NOTE — Telephone Encounter (Signed)
Left detailed message told to call back if had any concerns

## 2019-01-10 ENCOUNTER — Other Ambulatory Visit: Payer: Self-pay | Admitting: *Deleted

## 2019-01-10 DIAGNOSIS — Z87891 Personal history of nicotine dependence: Secondary | ICD-10-CM

## 2019-01-11 ENCOUNTER — Other Ambulatory Visit: Payer: Self-pay | Admitting: Physician Assistant

## 2019-02-23 ENCOUNTER — Other Ambulatory Visit: Payer: Self-pay | Admitting: Physician Assistant

## 2019-02-23 DIAGNOSIS — F339 Major depressive disorder, recurrent, unspecified: Secondary | ICD-10-CM

## 2019-02-26 ENCOUNTER — Other Ambulatory Visit: Payer: Self-pay | Admitting: Physician Assistant

## 2019-03-20 ENCOUNTER — Telehealth: Payer: Self-pay

## 2019-03-20 ENCOUNTER — Other Ambulatory Visit: Payer: Self-pay

## 2019-03-20 ENCOUNTER — Ambulatory Visit: Payer: 59 | Admitting: Gastroenterology

## 2019-03-20 ENCOUNTER — Encounter: Payer: Self-pay | Admitting: Gastroenterology

## 2019-03-20 DIAGNOSIS — K625 Hemorrhage of anus and rectum: Secondary | ICD-10-CM

## 2019-03-20 DIAGNOSIS — R1010 Upper abdominal pain, unspecified: Secondary | ICD-10-CM | POA: Diagnosis not present

## 2019-03-20 DIAGNOSIS — R7989 Other specified abnormal findings of blood chemistry: Secondary | ICD-10-CM

## 2019-03-20 DIAGNOSIS — R1013 Epigastric pain: Secondary | ICD-10-CM

## 2019-03-20 DIAGNOSIS — G8929 Other chronic pain: Secondary | ICD-10-CM

## 2019-03-20 MED ORDER — HYDROCORTISONE (PERIANAL) 2.5 % EX CREA: 1.0000 "application " | TOPICAL_CREAM | Freq: Two times a day (BID) | CUTANEOUS | 1 refills | Status: DC

## 2019-03-20 NOTE — Patient Instructions (Signed)
Please have blood work done today. We will call with results!  We are arranging a CT scan next week for you.   You will need a colonoscopy for sure, possibly an upper endoscopy. We will review CT scan when it is completed.   I sent in Anusol cream to take twice a day per rectum.   I recommend Benefiber 2 teaspoons daily and increasing to three times per day. Decrease if worsening bloating, diarrhea. We are trying to even out bowel regimen.  Further recommendations to follow!   It was a pleasure to see you today. I want to create trusting relationships with patients to provide genuine, compassionate, and quality care. I value your feedback. If you receive a survey regarding your visit,  I greatly appreciate you taking time to fill this out.   Annitta Needs, PhD, ANP-BC [facility]

## 2019-03-20 NOTE — Progress Notes (Signed)
Primary Care Physician:  Terald Sleeper, PA-C Primary Gastroenterologist:  Dr. Gala Romney  Chief Complaint  Patient presents with  . other    Losing control of bowels    HPI:   [name] is a 59 y.o. female presenting today with history of constipation, dysphagia, GERD, now with concerns of fecal incontinence. Last colonoscopy in 2012 at outside facility was normal. Last EGD 2015 with erosions, negative H.pylori.   Constipation resolved since decreasing hydrocodone. Notes upper abdominal discomfort/pain in the morning. Prescribed Nexium by PCP about 1-1.5 years ago. Hasn't noticed significant improvement with this. Sometimes pain all day, sometimes not. Postprandial component. LUQ discomfort. Pain onset a few years ago. Gallbladder absent. Takes Ibuprofen 200 mg 2-4 per day.   "sharts". If bending over to pick something up, will leak some. Stool is blobs in toilet. Within the past week, she will wake up at night and can't make it to the bathroom. Stool was white, pale. No overt GI bleeding. Afraid to eat because of afraid of pain. N/V intermittently.   Rectal bleeding last week with rectal irritation. Notes prolapsing at times but able to reduce manually.   Alk Phos 179 in Nov 2020, AST 32, ALT 35. Review of labs with mildly elevated alk phos chronically.   Past Medical History:  Diagnosis Date  . Anxiety   . Arthritis   . Constipation 02/17/2015  . Emphysema lung ([place])   . Fibromyalgia   . GERD (gastroesophageal reflux disease)   . Hypertension   . 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 Menopausal vaginal dryness 02/17/2015    Past Surgical History:  Procedure Laterality Date  . CHOLECYSTECTOMY N/A 04/01/2016   Procedure: LAPAROSCOPIC CHOLECYSTECTOMY;  Surgeon: Aviva Signs, MD;  Location: [facility];  Service: General;  Laterality: N/A;  . COLONOSCOPY  11/2010   normal colonoscopy from anal verge to ileocecal valve; no polyps, masses, AVMs, diverticula, or evidence  of inflammation  . ESOPHAGOGASTRODUODENOSCOPY N/A 10/16/2013   HAF:BXUXYBFX ring s/p dilation/2 cm HH. multiple 3 mm round and shallow erosions. Negative H pylori  . 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 Hiatal hernia  . LAPAROSCOPIC APPENDECTOMY N/A 06/21/2013   Procedure: APPENDECTOMY LAPAROSCOPIC;  Surgeon: Jamesetta So, MD;  Location: [facility];  Service: General;  Laterality: N/A;  Venia Minks DILATION N/A 10/16/2013   Procedure: Keturah Shavers;  Surgeon: Daneil Dolin, MD;  Location: [facility];  Service: Endoscopy;  Laterality: N/A;  . PORTACATH PLACEMENT  [id number]   Rt   IJ  Port-A-Cath  [email or web address]  . STERNAL WIRES REMOVAL  01/23/2012   Procedure: STERNAL WIRES REMOVAL;  Surgeon: Grace Isaac, MD;  Location: [place];  Service: Thoracic;  Laterality: N/A;  . THORACOTOMY  [id number]   transsmanubrial anterolaterl thoracotomy with chest wall resection includinf the first rib and left  upper lobectomy for Pancoast turmor  . TUBAL LIGATION      Current Outpatient Medications  Medication Sig Dispense Refill  . budesonide-formoterol (SYMBICORT) 80-4.5 MCG/ACT inhaler Inhale 2 puffs into the lungs 2 (two) times daily. 1 Inhaler 5  . diltiazem (CARDIZEM CD) 180 MG 24 hr capsule TAKE 1 CAPSULE TWICE A DAY FOR BLOOD PRESSURE (Patient taking differently: TAKE 1 CAPSULE TWICE A DAY FOR BLOOD PRESSURE/ PT IS ONLY TAKING ONCE A DAY) 60 capsule 11  . escitalopram (LEXAPRO) 10 MG tablet Take 1 tablet (10 mg total) by mouth daily.  30 tablet 4  . esomeprazole (NEXIUM) 40 MG capsule Take 1 capsule (40 mg total) by mouth daily at 12 noon. 30 capsule 0  . HYDROcodone-acetaminophen (NORCO) 10-325 MG tablet Take 2 tablets by mouth every 6 (six) hours as needed. 40 tablet 0  . LORazepam (ATIVAN) 1 MG tablet Take 1 tablet (1 mg total) by mouth at bedtime. 30 tablet 5  . PROAIR HFA 108 (90 Base) MCG/ACT inhaler 2 PUFFS EVERY 6 HOURS AS  NEEDED FOR WHEEZING OR SHORTNESS OF BREATH 8.5 g 1  . hydrocortisone (ANUSOL-HC) 2.5 % rectal cream Place 1 application rectally 2 (two) times daily. 30 g 1   No current facility-administered medications for this visit.    Allergies as of 03/20/2019 - Review Complete 03/20/2019  Allergen Reaction Noted  . Lisinopril Swelling 11/11/2017  . Nitrofuran derivatives  07/30/2018    Family History  Problem Relation Age of Onset  . Cancer Mother        lung  . Hypertension Father   . Stroke Father   . Hypertension Sister   . Heart disease Sister   . Cancer Brother 26       pancreatic  . Colon cancer Neg Hx     Social History   Socioeconomic History  . Marital status: Married    Spouse name: Not on file  . Number of children: Not on file  . Years of education: Not on file  . Highest education level: Not on file  Occupational History  . Not on file  Tobacco Use  . Smoking status: Former Smoker    Packs/day: 1.50    Years: 35.00    Pack years: 52.50    Types: Cigarettes    Quit date: 02/04/2010    Years since quitting: 9.1  . Smokeless tobacco: Never Used  . Tobacco comment: Quit x 8 years  Substance and Sexual Activity  . Alcohol use: No    Alcohol/week: 0.0 standard drinks  . Drug use: No  . Sexual activity: Yes    Birth control/protection: Post-menopausal  Other Topics Concern  . Not on file  Social History Narrative  . Not on file   Social Determinants of Health   Financial Resource Strain:   . Difficulty of Paying Living Expenses: Not on file  Food Insecurity:   . Worried About Charity fundraiser in the Last Year: Not on file  . Ran Out of Food in the Last Year: Not on file  Transportation Needs:   . Lack of Transportation (Medical): Not on file  . Lack of Transportation (Non-Medical): Not on file  Physical Activity:   . Days of Exercise per Week: Not on file  . Minutes of Exercise per Session: Not on file  Stress:   . Feeling of Stress : Not on file    Social Connections:   . Frequency of Communication with Friends and Family: Not on file  . Frequency of Social Gatherings with Friends and Family: Not on file  . Attends Religious Services: Not on file  . Active Member of Clubs or Organizations: Not on file  . Attends Archivist Meetings: Not on file  . Marital Status: Not on file  Intimate Partner Violence:   . Fear of Current or Ex-Partner: Not on file  . Emotionally Abused: Not on file  . Physically Abused: Not on file  . Sexually Abused: Not on file    Review of Systems: Gen: Denies any fever, chills, fatigue, weight loss,  lack of appetite.  CV: Denies chest pain, heart palpitations, peripheral edema, syncope.  Resp: Denies shortness of breath at rest or with exertion. Denies wheezing or cough.  GI: see HPI GU : Denies urinary burning, urinary frequency, urinary hesitancy MS: Denies joint pain, muscle weakness, cramps, or limitation of movement.  Derm: Denies rash, itching, dry skin Psych: Denies depression, anxiety, memory loss, and confusion Heme: see HPI  Physical Exam: BP (!) 156/96   Pulse 71   Temp (!) 97.3 F (36.3 C) (Temporal)   Ht '5\' 3"'$  (1.6 m)   Wt 141 lb 9.6 oz (64.2 kg)   BMI 25.08 kg/m  General:   Alert and oriented. Pleasant and cooperative. Well-nourished and well-developed.  Head:  Normocephalic and atraumatic. Eyes:  Without icterus, sclera clear and conjunctiva pink.  Ears:  Normal auditory acuity. Lungs:  Clear to auscultation bilaterally. No wheezes, rales, or rhonchi. No distress.  Heart:  S1, S2 present without murmurs appreciated.  Abdomen:  +BS, soft, TTP LUQ and epigastric and non-distended. No HSM noted. No guarding or rebound. No masses appreciated.  Rectal:  No external hemorrhoids. Internal exam query enlarged internal hemorrhoids but difficult to assess as no anoscopy done, no mass. Soft stool distal rectal vault.  Msk:  Symmetrical without gross deformities. Normal  posture. Extremities:  Without edema. Neurologic:  Alert and  oriented x4;  grossly normal neurologically. Skin:  Intact without significant lesions or rashes. Psych:  Alert and cooperative. Normal mood and affect.  ASSESSMENT: [name] is a 59 y.o. female presenting today with LUQ/epigastric pain for at least over a year, without improvement with PPI daily, new onset fecal urgency and incontinence at times, and reports of pale/clay-colored stool recently.   Abdominal pain: with reported abnormal stool of pale/clay-colored, need to pursue imaging. I do note that she has had a chronically elevated alk phos that is now increasing, most recently 179 in Nov 2020; she has had no significant elevated transaminases or bilirubin. If imaging unrevealing, recommend EGD, as she also endorses large amounts of Ibuprofen.  Fecal urgency and incontinence: appears baseline is still predominantly constipation, with moreso fecal seepage reports. Rectal exam without mass and clinically with reports of Grade 3 hemorrhoids. Will maximize bowel regimen by adding fiber and pursue colonoscopy due to recent rectal bleeding. Last colonoscopy normal in 2012.   PLAN:  HFP today  CT abd/pelvis with contrast in near future  Proceed with TCS with Dr. Gala Romney in near future using Propofol: the risks, benefits, and alternatives have been discussed with the patient in detail. The patient states understanding and desires to proceed. Possibly will need EGD if CT is unrevealing.  Continue Nexium daily  Anusol BID sent to pharmacy  Benefiber handout  Further recommendations to follow.  Annitta Needs, PhD, ANP-BC [facility]

## 2019-03-20 NOTE — Telephone Encounter (Signed)
PA for CT abd/pelvis submitted via [employer] website. Case pended. Reference# [id number].

## 2019-03-21 ENCOUNTER — Telehealth: Payer: Self-pay | Admitting: Internal Medicine

## 2019-03-21 ENCOUNTER — Other Ambulatory Visit: Payer: Self-pay

## 2019-03-21 DIAGNOSIS — G8929 Other chronic pain: Secondary | ICD-10-CM

## 2019-03-21 LAB — HEPATIC FUNCTION PANEL
AG Ratio: 1.5 (calc) (ref 1.0–2.5)
ALT: 25 U/L (ref 6–29)
AST: 22 U/L (ref 10–35)
Albumin: 4.4 g/dL (ref 3.6–5.1)
Alkaline phosphatase (APISO): 138 U/L (ref 37–153)
Bilirubin, Direct: 0.1 mg/dL (ref 0.0–0.2)
Globulin: 2.9 g/dL (calc) (ref 1.9–3.7)
Indirect Bilirubin: 0.3 mg/dL (calc) (ref 0.2–1.2)
Total Bilirubin: 0.4 mg/dL (ref 0.2–1.2)
Total Protein: 7.3 g/dL (ref 6.1–8.1)

## 2019-03-21 NOTE — Telephone Encounter (Signed)
Left a detailed message. Check mychart message from [facility].

## 2019-03-21 NOTE — Progress Notes (Signed)
[name]: your liver numbers are normal! We will await the CT scan. Sent in [place]

## 2019-03-21 NOTE — Progress Notes (Unsigned)
c 

## 2019-03-21 NOTE — Telephone Encounter (Signed)
CT approved. Request case# [id number], valid 03/20/19-06/18/19.  CT scheduled for 03/27/19 at 12:00pm, arrive at 11:45am. Needs creatinine. Pick up contrast before day of test. NPO 4 hours prior to test.  Called and informed pt of appt and details. Lab order entered. Pt has access to MyChart.

## 2019-03-21 NOTE — Telephone Encounter (Signed)
Routing to AM per result note. I already spoke to pt today.

## 2019-03-21 NOTE — Progress Notes (Signed)
CC'ED TO PCP 

## 2019-03-21 NOTE — Telephone Encounter (Signed)
Pt returning call. [phone]

## 2019-03-27 ENCOUNTER — Ambulatory Visit (HOSPITAL_COMMUNITY)
Admission: RE | Admit: 2019-03-27 | Discharge: 2019-03-27 | Disposition: A | Discharge status: Home / Self Care | Payer: 59 | Source: Ambulatory Visit | Attending: Gastroenterology | Admitting: Gastroenterology

## 2019-03-27 ENCOUNTER — Other Ambulatory Visit: Payer: Self-pay

## 2019-03-27 DIAGNOSIS — G8929 Other chronic pain: Secondary | ICD-10-CM | POA: Diagnosis present

## 2019-03-27 DIAGNOSIS — R1013 Epigastric pain: Secondary | ICD-10-CM | POA: Diagnosis present

## 2019-03-27 DIAGNOSIS — R7989 Other specified abnormal findings of blood chemistry: Secondary | ICD-10-CM | POA: Insufficient documentation

## 2019-03-27 LAB — CREATININE, SERUM: Creat: 0.81 mg/dL (ref 0.50–1.05)

## 2019-03-27 MED ORDER — IOHEXOL 300 MG/ML  SOLN
100.0000 mL | Freq: Once | INTRAMUSCULAR | Status: AC | PRN
  Administered 2019-03-27: 100 mL via INTRAVENOUS

## 2019-04-08 ENCOUNTER — Other Ambulatory Visit: Payer: Self-pay | Admitting: Physician Assistant

## 2019-05-10 ENCOUNTER — Encounter: Payer: Self-pay | Admitting: Nurse Practitioner

## 2019-05-10 ENCOUNTER — Ambulatory Visit: Payer: 59 | Admitting: Nurse Practitioner

## 2019-05-10 ENCOUNTER — Other Ambulatory Visit: Payer: Self-pay

## 2019-05-10 VITALS — BP 144/87 | HR 59 | Temp 98.4°F | Resp 20 | Ht 63.0 in | Wt 142.0 lb

## 2019-05-10 DIAGNOSIS — K219 Gastro-esophageal reflux disease without esophagitis: Secondary | ICD-10-CM

## 2019-05-10 DIAGNOSIS — F339 Major depressive disorder, recurrent, unspecified: Secondary | ICD-10-CM

## 2019-05-10 DIAGNOSIS — F419 Anxiety disorder, unspecified: Secondary | ICD-10-CM

## 2019-05-10 DIAGNOSIS — R1310 Dysphagia, unspecified: Secondary | ICD-10-CM | POA: Diagnosis not present

## 2019-05-10 DIAGNOSIS — J410 Simple chronic bronchitis: Secondary | ICD-10-CM | POA: Insufficient documentation

## 2019-05-10 DIAGNOSIS — I10 Essential (primary) hypertension: Secondary | ICD-10-CM | POA: Diagnosis not present

## 2019-05-10 DIAGNOSIS — M5030 Other cervical disc degeneration, unspecified cervical region: Secondary | ICD-10-CM

## 2019-05-10 DIAGNOSIS — G629 Polyneuropathy, unspecified: Secondary | ICD-10-CM | POA: Diagnosis not present

## 2019-05-10 DIAGNOSIS — M5137 Other intervertebral disc degeneration, lumbosacral region: Secondary | ICD-10-CM

## 2019-05-10 DIAGNOSIS — R0600 Dyspnea, unspecified: Secondary | ICD-10-CM

## 2019-05-10 DIAGNOSIS — R1319 Other dysphagia: Secondary | ICD-10-CM

## 2019-05-10 DIAGNOSIS — K5900 Constipation, unspecified: Secondary | ICD-10-CM

## 2019-05-10 MED ORDER — LORAZEPAM 1 MG PO TABS: 1.0000 mg | ORAL_TABLET | Freq: Every day | ORAL | 5 refills | Status: DC

## 2019-05-10 MED ORDER — BUDESONIDE-FORMOTEROL FUMARATE 80-4.5 MCG/ACT IN AERO: 2.0000 | INHALATION_SPRAY | Freq: Two times a day (BID) | RESPIRATORY_TRACT | 5 refills | Status: DC

## 2019-05-10 MED ORDER — HYDROCODONE-ACETAMINOPHEN 10-325 MG PO TABS: 2.0000 | ORAL_TABLET | Freq: Every day | ORAL | 0 refills | Status: DC | PRN

## 2019-05-10 MED ORDER — ESOMEPRAZOLE MAGNESIUM 40 MG PO CPDR: 40.0000 mg | DELAYED_RELEASE_CAPSULE | Freq: Every day | ORAL | 2 refills | Status: DC

## 2019-05-10 MED ORDER — HYDROCHLOROTHIAZIDE 12.5 MG PO CAPS: 12.5000 mg | ORAL_CAPSULE | Freq: Every day | ORAL | 1 refills | Status: DC

## 2019-05-10 MED ORDER — ESCITALOPRAM OXALATE 10 MG PO TABS: 10.0000 mg | ORAL_TABLET | Freq: Every day | ORAL | 4 refills | Status: DC

## 2019-05-10 NOTE — Patient Instructions (Signed)
Stress, Adult Stress is a normal reaction to life events. Stress is what you feel when life demands more than you are used to, or more than you think you can handle. Some stress can be useful, such as studying for a test or meeting a deadline at work. Stress that occurs too often or for too long can cause problems. It can affect your emotional health and interfere with relationships and normal daily activities. Too much stress can weaken your body's defense system (immune system) and increase your risk for physical illness. If you already have a medical problem, stress can make it worse. What are the causes? All sorts of life events can cause stress. An event that causes stress for one person may not be stressful for another person. Major life events, whether positive or negative, commonly cause stress. Examples include:  Losing a job or starting a new job.  Losing a loved one.  Moving to a new town or home.  Getting married or divorced.  Having a baby.  Getting injured or sick. Less obvious life events can also cause stress, especially if they occur day after day or in combination with each other. Examples include:  Working long hours.  Driving in traffic.  Caring for children.  Being in debt.  Being in a difficult relationship. What are the signs or symptoms? Stress can cause emotional symptoms, including:  Anxiety. This is feeling worried, afraid, on edge, overwhelmed, or out of control.  Anger, including irritation or impatience.  Depression. This is feeling sad, down, helpless, or guilty.  Trouble focusing, remembering, or making decisions. Stress can cause physical symptoms, including:  Aches and pains. These may affect your head, neck, back, stomach, or other areas of your body.  Tight muscles or a clenched jaw.  Low energy.  Trouble sleeping. Stress can cause unhealthy behaviors, including:  Eating to feel better (overeating) or skipping meals.  Working too  much or putting off tasks.  Smoking, drinking alcohol, or using drugs to feel better. How is this diagnosed? Stress is diagnosed through an assessment by your health care provider. He or she may diagnose this condition based on:  Your symptoms and any stressful life events.  Your medical history.  Tests to rule out other causes of your symptoms. Depending on your condition, your health care provider may refer you to a specialist for further evaluation. How is this treated?  Stress management techniques are the recommended treatment for stress. Medicine is not typically recommended for the treatment of stress. Techniques to reduce your reaction to stressful life events include:  Stress identification. Monitor yourself for symptoms of stress and identify what causes stress for you. These skills may help you to avoid or prepare for stressful events.  Time management. Set your priorities, keep a calendar of events, and learn to say no. Taking these actions can help you avoid making too many commitments. Techniques for coping with stress include:  Rethinking the problem. Try to think realistically about stressful events rather than ignoring them or overreacting. Try to find the positives in a stressful situation rather than focusing on the negatives.  Exercise. Physical exercise can release both physical and emotional tension. The key is to find a form of exercise that you enjoy and do it regularly.  Relaxation techniques. These relax the body and mind. The key is to find one or more that you enjoy and use the techniques regularly. Examples include: ? Meditation, deep breathing, or progressive relaxation techniques. ? Yoga or   tai chi. ? Biofeedback, mindfulness techniques, or journaling. ? Listening to music, being out in nature, or participating in other hobbies.  Practicing a healthy lifestyle. Eat a balanced diet, drink plenty of water, limit or avoid caffeine, and get plenty of  sleep.  Having a strong support network. Spend time with family, friends, or other people you enjoy being around. Express your feelings and talk things over with someone you trust. Counseling or talk therapy with a mental health professional may be helpful if you are having trouble managing stress on your own. Follow these instructions at home: Lifestyle   Avoid drugs.  Do not use any products that contain nicotine or tobacco, such as cigarettes, e-cigarettes, and chewing tobacco. If you need help quitting, ask your health care provider.  Limit alcohol intake to no more than 1 drink a day for nonpregnant women and 2 drinks a day for men. One drink equals 12 oz of beer, 5 oz of wine, or 1 oz of hard liquor  Do not use alcohol or drugs to relax.  Eat a balanced diet that includes fresh fruits and vegetables, whole grains, lean meats, fish, eggs, and beans, and low-fat dairy. Avoid processed foods and foods high in added fat, sugar, and salt.  Exercise at least 30 minutes on 5 or more days each week.  Get 7-8 hours of sleep each night. General instructions   Practice stress management techniques as discussed with your health care provider.  Drink enough fluid to keep your urine clear or pale yellow.  Take over-the-counter and prescription medicines only as told by your health care provider.  Keep all follow-up visits as told by your health care provider. This is important. Contact a health care provider if:  Your symptoms get worse.  You have new symptoms.  You feel overwhelmed by your problems and can no longer manage them on your own. Get help right away if:  You have thoughts of hurting yourself or others. If you ever feel like you may hurt yourself or others, or have thoughts about taking your own life, get help right away. You can go to your nearest emergency department or call:  Your local emergency services (911 in the [place]  A suicide crisis helpline, such as the  [place] at (316) 250-6172. This is open 24 hours a day. Summary  Stress is a normal reaction to life events. It can cause problems if it happens too often or for too long.  Practicing stress management techniques is the best way to treat stress.  Counseling or talk therapy with a mental health professional may be helpful if you are having trouble managing stress on your own. This information is not intended to replace advice given to you by your health care provider. Make sure you discuss any questions you have with your health care provider. Document Revised: 08/10/2018 Document Reviewed: 03/02/2016 Elsevier Patient Education  [place].

## 2019-05-10 NOTE — Progress Notes (Signed)
Subjective:    Patient ID: [name], female    DOB: Nov 05, 1960, 59 y.o.   MRN: [id number]   Chief Complaint: medical management of chronic issues   HPI:  1. Essential hypertension No c//o chest pain, sob or headache. Does not check blood pressure at home. Was on lisinopril in past and had allergic reaction.  BP Readings from Last 3 Encounters:  03/20/19 (!) 156/96  12/25/18 (!) 151/84  11/30/18 (!) 145/84     2. Gastroesophageal reflux disease without esophagitis Is on nexium daily and works well to keep symptoms under control  3. Esophageal dysphagia Denies any trouble swallowing- was coming form complictaions of lung cancer. She had esophagus stretched which fixed the problem.  4. Neuropathy In bil feet and hands. 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  5. DDD (degenerative disc disease), cervical Pain assessment: Cause of pain- DDD Pain location- lower back Pain on scale of 1-10- 3/10 currently Frequency- daily What increases pain- nothing really What makes pain Better-ice, antiinflammatory Effects on ADL - able to still work Any change in general medical condition-none  Current opioids rx- norco - only PRN- # meds rx- 30 tablets last 4 months Effectiveness of current meds-helps Adverse reactions form pain meds-none Morphine equivalent- 20 MEDD  Pill count performed-No Last drug screen - never ( high risk q40m moderate risk q665mlow risk yearly ) Urine drug screen today- Yes [place]  If yes were their any concerning findings? - none   Overdose risk: 0   Pain contract signed on:05/10/19   6. Anxiety Takes ativan mosyt nights mainly to sleep GAD 7 : Generalized Anxiety Score 05/10/2019 11/30/2018  Nervous, Anxious, on Edge 1 1  Control/stop worrying 1 0  Worry too much - different things 0 0  Trouble relaxing 1 0  Restless 0 0  Easily annoyed or irritable 1 1  Afraid - awful might  happen 0 0  Total GAD 7 Score 4 2  Anxiety Difficulty Not difficult at all Not difficult at all      7. Constipation, unspecified constipation type No problems as oh late  8. 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 symbicort works well.  9. Depression Is on lexapro and is doing well. She has been on it for many years [facility] 05/10/2019 12/25/2018 11/30/2018  Decreased Interest 0 0 1  Down, Depressed, Hopeless 0 0 1  PHQ - 2 Score 0 0 2  Altered sleeping - - 0  Tired, decreased energy - - 2  Change in appetite - - 0  Feeling bad or failure about yourself  - - 1  Trouble concentrating - - 0  Moving slowly or fidgety/restless - - 0  Suicidal thoughts - - 0  PHQ-9 Score - - 5  Difficult doing work/chores - - Not difficult at all        Outpatient Encounter Medications as of 05/10/2019  Medication Sig  . esomeprazole (NEXIUM) 40 MG capsule Take 1 capsule (40 mg total) by mouth daily at 12 noon.  . budesonide-formoterol (SYMBICORT) 80-4.5 MCG/ACT inhaler Inhale 2 puffs into the lungs 2 (two) times daily.  . Marland Kitcheniltiazem (CARDIZEM CD) 180 MG 24 hr capsule TAKE 1 CAPSULE TWICE A DAY FOR BLOOD PRESSURE (Patient taking differently: TAKE 1 CAPSULE TWICE A DAY FOR BLOOD PRESSURE/ PT IS ONLY TAKING ONCE A DAY)  . escitalopram (LEXAPRO) 10 MG tablet Take 1  tablet (10 mg total) by mouth daily.  Marland Kitchen HYDROcodone-acetaminophen (NORCO) 10-325 MG tablet Take 2 tablets by mouth every 6 (six) hours as needed.  . hydrocortisone (ANUSOL-HC) 2.5 % rectal cream Place 1 application rectally 2 (two) times daily.  Marland Kitchen LORazepam (ATIVAN) 1 MG tablet Take 1 tablet (1 mg total) by mouth at bedtime.  Marland Kitchen PROAIR HFA 108 (90 Base) MCG/ACT inhaler 2 PUFFS EVERY 6 HOURS AS NEEDED FOR WHEEZING OR SHORTNESS OF BREATH     Past Surgical History:  Procedure Laterality Date  . CHOLECYSTECTOMY N/A 04/01/2016   Procedure: LAPAROSCOPIC CHOLECYSTECTOMY;  Surgeon: Aviva Signs, MD;  Location: [facility];   Service: General;  Laterality: N/A;  . COLONOSCOPY  11/2010   normal colonoscopy from anal verge to ileocecal valve; no polyps, masses, AVMs, diverticula, or evidence of inflammation  . ESOPHAGOGASTRODUODENOSCOPY N/A 10/16/2013   KPV:VZSMOLMB ring s/p dilation/2 cm HH. multiple 3 mm round and shallow erosions. Negative H pylori  . 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 Hiatal hernia  . LAPAROSCOPIC APPENDECTOMY N/A 06/21/2013   Procedure: APPENDECTOMY LAPAROSCOPIC;  Surgeon: Jamesetta So, MD;  Location: [facility];  Service: General;  Laterality: N/A;  Venia Minks DILATION N/A 10/16/2013   Procedure: Keturah Shavers;  Surgeon: Daneil Dolin, MD;  [facility];  Service: Endoscopy;  Laterality: N/A;  . PORTACATH PLACEMENT  [id number]   Rt   IJ  Port-A-Cath  dr.burney  . STERNAL WIRES REMOVAL  01/23/2012   Procedure: STERNAL WIRES REMOVAL;  Surgeon: Grace Isaac, MD;  Location: [place];  Service: Thoracic;  Laterality: N/A;  . THORACOTOMY  [id number]   transsmanubrial anterolaterl thoracotomy with chest wall resection includinf the first rib and left  upper lobectomy for Pancoast turmor  . TUBAL LIGATION      Family History  Problem Relation Age of Onset  . Cancer Mother        lung  . Hypertension Father   . Stroke Father   . Hypertension Sister   . Heart disease Sister   . Cancer Brother 19       pancreatic  . Colon cancer Neg Hx     New complaints: None today  Social history: Lives with her husband  Controlled substance contract: 05/10/19    Review of Systems  Constitutional: Negative for diaphoresis.  Eyes: Negative for pain.  Respiratory: Negative for shortness of breath.   Cardiovascular: Negative for chest pain, palpitations and leg swelling.  Gastrointestinal: Negative for abdominal pain.  Endocrine: Negative for polydipsia.  Skin: Negative for rash.  Neurological: Negative for dizziness,  weakness and headaches.  Hematological: Does not bruise/bleed easily.  All other systems reviewed and are negative.      Objective:   Physical Exam Vitals and nursing note reviewed.  Constitutional:      General: She is not in acute distress.    Appearance: Normal appearance. She is well-developed.  HENT:     Head: Normocephalic.     Nose: Nose normal.  Eyes:     Pupils: Pupils are equal, round, and reactive to light.  Neck:     Vascular: No carotid bruit or JVD.  Cardiovascular:     Rate and Rhythm: Normal rate and regular rhythm.     Heart sounds: Normal heart sounds.  Pulmonary:     Effort: Pulmonary effort is normal. No respiratory distress.     Breath sounds: Normal breath sounds. No wheezing or rales.  Chest:  Chest wall: No tenderness.  Abdominal:     General: Bowel sounds are normal. There is no distension or abdominal bruit.     Palpations: Abdomen is soft. There is no hepatomegaly, splenomegaly, mass or pulsatile mass.     Tenderness: There is no abdominal tenderness.  Musculoskeletal:        General: Normal range of motion.     Cervical back: Normal range of motion and neck supple.  Lymphadenopathy:     Cervical: No cervical adenopathy.  Skin:    General: Skin is warm and dry.  Neurological:     Mental Status: She is alert and oriented to person, place, and time.     Deep Tendon Reflexes: Reflexes are normal and symmetric.  Psychiatric:        Behavior: Behavior normal.        Thought Content: Thought content normal.        Judgment: Judgment normal.    BP (!) 144/87   Pulse (!) 59   Temp 98.4 F (36.9 C) (Temporal)   Resp 20   Ht _0  (1.6 m)   Wt 142 lb (64.4 kg)   BMI 25.15 kg/m         Assessment & Plan:  Illeana T Goodlow comes in today with chief complaint of Medical Management of Chronic Issues   Diagnosis and orders addressed:  1. Essential hypertension Low sodium diet - CBC with Differential/Platelet - CMP14+EGFR - Lipid  panel - Thyroid Panel With TSH - hydrochlorothiazide (MICROZIDE) 12.5 MG capsule; Take 1 capsule (12.5 mg total) by mouth daily.  Dispense: 90 capsule; Refill: 1  2. Gastroesophageal reflux disease without esophagitis Avoid spicy foods Do not eat 2 hours prior to bedtime - esomeprazole (NEXIUM) 40 MG capsule; Take 1 capsule (40 mg total) by mouth daily at 12 noon.  Dispense: 30 capsule; Refill: 2  3. Esophageal dysphagia Chew food well  4. Neuropathy   5. DDD (degenerative disc disease), cervical Discussed pain meds Since only ues occasionally and only gets 40 tabelst every 3-4 months - ToxASSURE Select 13 (MW), Urine  6. Anxiety Will continue ativan at courent dose - LORazepam (ATIVAN) 1 MG tablet; Take 1 tablet (1 mg total) by mouth at bedtime.  Dispense: 30 tablet; Refill: 5  7. Constipation, unspecified constipation type  8. Degeneration of lumbar or lumbosacral intervertebral disc - HYDROcodone-acetaminophen (NORCO) 10-325 MG tablet; Take 2 tablets by mouth daily as needed.  Dispense: 40 tablet; Refill: 0  9. Depression, recurrent ([place]) stres management - escitalopram (LEXAPRO) 10 MG tablet; Take 1 tablet (10 mg total) by mouth daily.  Dispense: 30 tablet; Refill: 4  10. Simple chronic bronchitis ([place]) Continue symbicort as rx   Labs pending Health Maintenance reviewed Diet and exercise encouraged  Follow up plan: 3 months   Mary-Margaret Hassell Done, FNP

## 2019-05-11 LAB — CBC WITH DIFFERENTIAL/PLATELET
Basophils Absolute: 0.1 10*3/uL (ref 0.0–0.2)
Basos: 2 %
EOS (ABSOLUTE): 0.6 10*3/uL — ABNORMAL HIGH (ref 0.0–0.4)
Eos: 9 %
Hematocrit: 36.3 % (ref 34.0–46.6)
Hemoglobin: 12.5 g/dL (ref 11.1–15.9)
Immature Grans (Abs): 0 10*3/uL (ref 0.0–0.1)
Immature Granulocytes: 0 %
Lymphocytes Absolute: 1.7 10*3/uL (ref 0.7–3.1)
Lymphs: 26 %
MCH: 29.6 pg (ref 26.6–33.0)
MCHC: 34.4 g/dL (ref 31.5–35.7)
MCV: 86 fL (ref 79–97)
Monocytes Absolute: 0.5 10*3/uL (ref 0.1–0.9)
Monocytes: 8 %
Neutrophils Absolute: 3.7 10*3/uL (ref 1.4–7.0)
Neutrophils: 55 %
Platelets: 426 10*3/uL (ref 150–450)
RBC: 4.22 x10E6/uL (ref 3.77–5.28)
RDW: 12.5 % (ref 11.7–15.4)
WBC: 6.6 10*3/uL (ref 3.4–10.8)

## 2019-05-11 LAB — CMP14+EGFR
ALT: 33 IU/L — ABNORMAL HIGH (ref 0–32)
AST: 31 IU/L (ref 0–40)
Albumin/Globulin Ratio: 1.8 (ref 1.2–2.2)
Albumin: 4.3 g/dL (ref 3.8–4.9)
Alkaline Phosphatase: 181 IU/L — ABNORMAL HIGH (ref 39–117)
BUN/Creatinine Ratio: 18 (ref 9–23)
BUN: 15 mg/dL (ref 6–24)
Bilirubin Total: 0.2 mg/dL (ref 0.0–1.2)
CO2: 27 mmol/L (ref 20–29)
Calcium: 9.2 mg/dL (ref 8.7–10.2)
Chloride: 104 mmol/L (ref 96–106)
Creatinine, Ser: 0.85 mg/dL (ref 0.57–1.00)
GFR calc Af Amer: 87 mL/min/{1.73_m2} (ref >59)
GFR calc non Af Amer: 75 mL/min/{1.73_m2} (ref >59)
Globulin, Total: 2.4 g/dL (ref 1.5–4.5)
Glucose: 86 mg/dL (ref 65–99)
Potassium: 3.7 mmol/L (ref 3.5–5.2)
Sodium: 145 mmol/L — ABNORMAL HIGH (ref 134–144)
Total Protein: 6.7 g/dL (ref 6.0–8.5)

## 2019-05-11 LAB — LIPID PANEL
Chol/HDL Ratio: 3.1 ratio (ref 0.0–4.4)
Cholesterol, Total: 201 mg/dL — ABNORMAL HIGH (ref 100–199)
HDL: 64 mg/dL (ref >39)
LDL Chol Calc (NIH): 104 mg/dL — ABNORMAL HIGH (ref 0–99)
Triglycerides: 195 mg/dL — ABNORMAL HIGH (ref 0–149)
VLDL Cholesterol Cal: 33 mg/dL (ref 5–40)

## 2019-05-11 LAB — THYROID PANEL WITH TSH
Free Thyroxine Index: 1.8 (ref 1.2–4.9)
T3 Uptake Ratio: 22 % — ABNORMAL LOW (ref 24–39)
T4, Total: 8.1 ug/dL (ref 4.5–12.0)
TSH: 1.57 u[IU]/mL (ref 0.450–4.500)

## 2019-05-14 LAB — TOXASSURE SELECT 13 (MW), URINE

## 2019-05-15 ENCOUNTER — Telehealth: Payer: Self-pay

## 2019-05-15 NOTE — Telephone Encounter (Signed)
Understandable about insurance. Recommend pursuing evaluation as soon as she can depending on insurance. Don't want to postpone diagnostic evaluation that could uncover something going on. How is she feeling?

## 2019-05-15 NOTE — Telephone Encounter (Signed)
Letter was previously mailed to pt to f/u with her care. AB asked If pt is willing to do MRI or Otherwise, need to pursue colonoscopy as planned (due to rectal bleeding) and EGD due to abdominal pain with Dr. Gala Romney. Needs Propofol. Pt has gotten new insurance and and will be switching back to her old insurance soon. Pt would like to hold off on these procedures until she gets her insurance under control, if this is ok? Please advise.

## 2019-05-15 NOTE — Telephone Encounter (Signed)
Tried calling pt back. Not able to leave VM on work number. Pt requested that I cal her back on her work phone. Will call pt back.

## 2019-05-16 NOTE — Telephone Encounter (Signed)
Spoke with pt. Pt states she feels pretty good daily. She hasn't had a bad spell like she had when she was seen in our office. Sometimes in the mornings, pt will feel a stabbing pain. She states when she eats, it gets better. If she doesn't eat, the cramping will start and she will vomit green (bile). Pt keeps crackers and mylanta on hand to eat if need. This doesn't happen much, but it does still occur.

## 2019-07-01 ENCOUNTER — Ambulatory Visit: Payer: BC Managed Care – PPO | Admitting: Physician Assistant

## 2019-07-15 ENCOUNTER — Other Ambulatory Visit: Payer: Self-pay | Admitting: Nurse Practitioner

## 2019-07-15 DIAGNOSIS — M5137 Other intervertebral disc degeneration, lumbosacral region: Secondary | ICD-10-CM

## 2019-07-15 MED ORDER — HYDROCODONE-ACETAMINOPHEN 10-325 MG PO TABS: 2.0000 | ORAL_TABLET | Freq: Every day | ORAL | 0 refills | Status: DC | PRN

## 2019-07-31 ENCOUNTER — Other Ambulatory Visit: Payer: Self-pay | Admitting: *Deleted

## 2019-07-31 DIAGNOSIS — I10 Essential (primary) hypertension: Secondary | ICD-10-CM

## 2019-07-31 MED ORDER — DILTIAZEM HCL ER COATED BEADS 180 MG PO CP24: ORAL_CAPSULE | ORAL | 0 refills | Status: DC

## 2019-08-12 ENCOUNTER — Ambulatory Visit: Payer: Self-pay | Admitting: Nurse Practitioner

## 2019-08-28 NOTE — Telephone Encounter (Signed)
Wrapping up loose ends: need to get patient back in for appt.

## 2019-09-04 ENCOUNTER — Encounter: Payer: Self-pay | Admitting: Internal Medicine

## 2019-10-01 ENCOUNTER — Other Ambulatory Visit: Payer: Self-pay | Admitting: Nurse Practitioner

## 2019-10-01 DIAGNOSIS — I10 Essential (primary) hypertension: Secondary | ICD-10-CM

## 2019-10-22 ENCOUNTER — Ambulatory Visit: Payer: 59 | Admitting: Nurse Practitioner

## 2019-10-22 ENCOUNTER — Encounter (HOSPITAL_COMMUNITY): Payer: Self-pay | Admitting: *Deleted

## 2019-10-22 ENCOUNTER — Emergency Department (HOSPITAL_COMMUNITY): Payer: 59

## 2019-10-22 ENCOUNTER — Other Ambulatory Visit: Payer: Self-pay

## 2019-10-22 ENCOUNTER — Emergency Department (HOSPITAL_COMMUNITY)
Admission: EM | Admit: 2019-10-22 | Discharge: 2019-10-22 | Disposition: A | Discharge status: Home / Self Care | Payer: 59 | Attending: Emergency Medicine | Admitting: Emergency Medicine

## 2019-10-22 DIAGNOSIS — C3490 Malignant neoplasm of unspecified part of unspecified bronchus or lung: Secondary | ICD-10-CM | POA: Diagnosis not present

## 2019-10-22 DIAGNOSIS — Z87891 Personal history of nicotine dependence: Secondary | ICD-10-CM | POA: Diagnosis not present

## 2019-10-22 DIAGNOSIS — R0602 Shortness of breath: Secondary | ICD-10-CM

## 2019-10-22 DIAGNOSIS — U071 COVID-19: Secondary | ICD-10-CM | POA: Insufficient documentation

## 2019-10-22 DIAGNOSIS — I10 Essential (primary) hypertension: Secondary | ICD-10-CM | POA: Diagnosis not present

## 2019-10-22 DIAGNOSIS — J1282 Pneumonia due to coronavirus disease 2019: Secondary | ICD-10-CM | POA: Insufficient documentation

## 2019-10-22 DIAGNOSIS — Z79899 Other long term (current) drug therapy: Secondary | ICD-10-CM | POA: Insufficient documentation

## 2019-10-22 DIAGNOSIS — R05 Cough: Secondary | ICD-10-CM | POA: Diagnosis present

## 2019-10-22 DIAGNOSIS — R509 Fever, unspecified: Secondary | ICD-10-CM

## 2019-10-22 LAB — BASIC METABOLIC PANEL
Anion gap: 10 (ref 5–15)
BUN: 20 mg/dL (ref 6–20)
CO2: 29 mmol/L (ref 22–32)
Calcium: 8.7 mg/dL — ABNORMAL LOW (ref 8.9–10.3)
Chloride: 100 mmol/L (ref 98–111)
Creatinine, Ser: 0.74 mg/dL (ref 0.44–1.00)
GFR calc Af Amer: >60 mL/min (ref >60)
GFR calc non Af Amer: >60 mL/min (ref >60)
Glucose, Bld: 104 mg/dL — ABNORMAL HIGH (ref 70–99)
Potassium: 3.1 mmol/L — ABNORMAL LOW (ref 3.5–5.1)
Sodium: 139 mmol/L (ref 135–145)

## 2019-10-22 LAB — CBC WITH DIFFERENTIAL/PLATELET
Abs Immature Granulocytes: 0.01 10*3/uL (ref 0.00–0.07)
Basophils Absolute: 0 10*3/uL (ref 0.0–0.1)
Basophils Relative: 0 %
Eosinophils Absolute: 0 10*3/uL (ref 0.0–0.5)
Eosinophils Relative: 1 %
HCT: 39.8 % (ref 36.0–46.0)
Hemoglobin: 13 g/dL (ref 12.0–15.0)
Immature Granulocytes: 0 %
Lymphocytes Relative: 13 %
Lymphs Abs: 0.7 10*3/uL (ref 0.7–4.0)
MCH: 29.3 pg (ref 26.0–34.0)
MCHC: 32.7 g/dL (ref 30.0–36.0)
MCV: 89.6 fL (ref 80.0–100.0)
Monocytes Absolute: 0.3 10*3/uL (ref 0.1–1.0)
Monocytes Relative: 6 %
Neutro Abs: 4.1 10*3/uL (ref 1.7–7.7)
Neutrophils Relative %: 80 %
Platelets: 208 10*3/uL (ref 150–400)
RBC: 4.44 MIL/uL (ref 3.87–5.11)
RDW: 13.2 % (ref 11.5–15.5)
WBC: 5.2 10*3/uL (ref 4.0–10.5)
nRBC: 0 % (ref 0.0–0.2)

## 2019-10-22 MED ORDER — SODIUM CHLORIDE 0.9 % IV SOLN: INTRAVENOUS | Status: DC | PRN

## 2019-10-22 MED ORDER — ONDANSETRON 8 MG PO TBDP: 8.0000 mg | ORAL_TABLET | Freq: Three times a day (TID) | ORAL | 0 refills | Status: DC | PRN

## 2019-10-22 MED ORDER — METHYLPREDNISOLONE SODIUM SUCC 125 MG IJ SOLR: 125.0000 mg | Freq: Once | INTRAMUSCULAR | Status: DC | PRN

## 2019-10-22 MED ORDER — ACETAMINOPHEN 325 MG PO TABS
650.0000 mg | ORAL_TABLET | Freq: Once | ORAL | Status: DC
  Filled 2019-10-22: qty 2

## 2019-10-22 MED ORDER — EPINEPHRINE 0.3 MG/0.3ML IJ SOAJ: 0.3000 mg | Freq: Once | INTRAMUSCULAR | Status: DC | PRN

## 2019-10-22 MED ORDER — DIPHENHYDRAMINE HCL 50 MG/ML IJ SOLN: 50.0000 mg | Freq: Once | INTRAMUSCULAR | Status: DC | PRN

## 2019-10-22 MED ORDER — ALBUTEROL SULFATE HFA 108 (90 BASE) MCG/ACT IN AERS: 2.0000 | INHALATION_SPRAY | Freq: Once | RESPIRATORY_TRACT | Status: DC | PRN

## 2019-10-22 MED ORDER — ACETAMINOPHEN 325 MG PO TABS
650.0000 mg | ORAL_TABLET | Freq: Once | ORAL | Status: AC | PRN
  Administered 2019-10-22: 650 mg via ORAL
  Filled 2019-10-22: qty 2

## 2019-10-22 MED ORDER — SODIUM CHLORIDE 0.9 % IV SOLN
1200.0000 mg | Freq: Once | INTRAVENOUS | Status: AC
  Administered 2019-10-22: 1200 mg via INTRAVENOUS
  Filled 2019-10-22: qty 10

## 2019-10-22 MED ORDER — FAMOTIDINE IN NACL 20-0.9 MG/50ML-% IV SOLN: 20.0000 mg | Freq: Once | INTRAVENOUS | Status: DC | PRN

## 2019-10-22 NOTE — ED Triage Notes (Addendum)
Pt c/o low grade fever, cough, fatigue, SOB x 1 week. Pt diagnosed with Covid 10/15/19 with symptoms starting 10/14/19.  Pt had lung cancer with left upper lobectomy 9 years ago. Hx emphysema. Temp of 101.8 in triage. Ibuprofen last taken at 0900 at home. Tylenol administered in triage.

## 2019-10-22 NOTE — ED Provider Notes (Signed)
[facility] EMERGENCY DEPARTMENT Provider Note   CSN: [id number] Arrival date & time: 10/22/19  1045     History Chief Complaint  Patient presents with   Shortness of Breath    [name] is a 59 y.o. female.  HPI   Pt presents to the ED with complaints of COVID.  Pt states she started having symptoms last  Monday.  She tested positive 1 week ago Tuesday.  Patient states she has been having trouble with cough, fevers and fatigue.  Her fever started to break the end of last week so she thought she was getting better but then in the last several days the fevers have been persistent.  She has been trying over-the-counter medications including ibuprofen and vitamins.  Patient states her breathing has worsened and she is starting to get more short of breath.  She called her doctor to try to arrange for the monoclonal antibody infusion.  She has not heard back from them.  This morning with her persistent fever and worsening breathing she came to the ED for evaluation.  Past Medical History:  Diagnosis Date   Anxiety    Arthritis    Constipation 02/17/2015   Emphysema lung ([place])    Fibromyalgia    GERD (gastroesophageal reflux disease)    Hypertension    Lung cancer ([place]) 2012   pancoast tumor   Menopausal vaginal dryness 02/17/2015    Patient Active Problem List   Diagnosis Date Noted   Simple chronic bronchitis ([place]) 05/10/2019   Elevated LFTs 03/20/2019   Neuropathy 09/27/2016   DDD (degenerative disc disease), cervical 12/08/2015   History of lung cancer 12/08/2015   Cervical disc disorder with radiculopathy of cervical region 09/23/2015   Gastroesophageal reflux disease without esophagitis 03/19/2015   Menopausal vaginal dryness 02/17/2015   Constipation 02/17/2015   Esophageal dysphagia 09/25/2013   Hiccups 04/19/2013   Unspecified constipation 04/18/2013   Anxiety    Hypertension     Past Surgical History:  Procedure Laterality Date     CHOLECYSTECTOMY N/A 04/01/2016   Procedure: LAPAROSCOPIC CHOLECYSTECTOMY;  Surgeon: Aviva Signs, MD;  Location: [facility];  Service: General;  Laterality: N/A;   COLONOSCOPY  11/2010   normal colonoscopy from anal verge to ileocecal valve; no polyps, masses, AVMs, diverticula, or evidence of inflammation   ESOPHAGOGASTRODUODENOSCOPY N/A 10/16/2013   BHA:LPFXTKWI ring s/p dilation/2 cm HH. multiple 3 mm round and shallow erosions. 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 Hiatal hernia   LAPAROSCOPIC APPENDECTOMY N/A 06/21/2013   Procedure: APPENDECTOMY LAPAROSCOPIC;  Surgeon: Jamesetta So, MD;  Location: [facility];  Service: General;  Laterality: N/A;   MALONEY DILATION N/A 10/16/2013   Procedure: Keturah Shavers;  Surgeon: Daneil Dolin, MD;  [facility];  Service: Endoscopy;  Laterality: N/ASol [place] PLACEMENT  [id number]   Rt   IJ  Port-A-Cath  dr.burney   STERNAL WIRES REMOVAL  01/23/2012   Procedure: STERNAL WIRES REMOVAL;  Surgeon: Grace Isaac, MD;  Location: [place];  Service: Thoracic;  Laterality: N/A;   THORACOTOMY  [id number]   transsmanubrial anterolaterl thoracotomy with chest wall resection includinf the first rib and left  upper lobectomy for Pancoast turmor   TUBAL LIGATION       OB History    Gravida  4   Para  2   Term      Preterm      AB  2   Living  2     SAB  1   TAB  1   Ectopic      Multiple      Live Births              Family History  Problem Relation Age of Onset   Cancer Mother        lung   Hypertension Father    Stroke Father    Hypertension Sister    Heart disease Sister    Cancer Brother 83       pancreatic   Colon cancer Neg Hx     Social History   Tobacco Use   Smoking status: Former Smoker    Packs/day: 1.50    Years: 35.00    Pack years: 52.50    Types: Cigarettes    Quit date: 02/04/2010     Years since quitting: 9.7   Smokeless tobacco: Never Used   Tobacco comment: Quit x 8 years  Vaping Use   Vaping Use: Never used  Substance Use Topics   Alcohol use: Yes    Alcohol/week: 0.0 standard drinks    Comment: occasionally    Drug use: No    Home Medications Prior to Admission medications   Medication Sig Start Date End Date Taking? Authorizing Provider  budesonide-formoterol (SYMBICORT) 80-4.5 MCG/ACT inhaler Inhale 2 puffs into the lungs 2 (two) times daily. 05/10/19   Hassell Done, Mary-Margaret, FNP  diltiazem (CARDIZEM CD) 180 MG 24 hr capsule TAKE  (1)  CAPSULE  TWICE DAILY FOR HIGH BLOOD PRESSURE. 10/01/19   Hassell Done, Mary-Margaret, FNP  escitalopram (LEXAPRO) 10 MG tablet Take 1 tablet (10 mg total) by mouth daily. 05/10/19   Hassell Done, Mary-Margaret, FNP  esomeprazole (NEXIUM) 40 MG capsule Take 1 capsule (40 mg total) by mouth daily at 12 noon. 05/10/19   Hassell Done, Mary-Margaret, FNP  hydrochlorothiazide (MICROZIDE) 12.5 MG capsule Take 1 capsule (12.5 mg total) by mouth daily. 05/10/19   Hassell Done Mary-Margaret, FNP  HYDROcodone-acetaminophen (NORCO) 10-325 MG tablet Take 2 tablets by mouth daily as needed. 07/15/19   Hassell Done, Mary-Margaret, FNP  hydrocortisone (ANUSOL-HC) 2.5 % rectal cream Place 1 application rectally 2 (two) times daily. 03/20/19   Annitta Needs, NP  LORazepam (ATIVAN) 1 MG tablet Take 1 tablet (1 mg total) by mouth at bedtime. 05/10/19   Hassell Done, Mary-Margaret, FNP  ondansetron (ZOFRAN ODT) 8 MG disintegrating tablet Take 1 tablet (8 mg total) by mouth every 8 (eight) hours as needed for nausea or vomiting. 10/22/19   Dorie Rank, MD  PROAIR HFA 108 [phone] Base) MCG/ACT inhaler 2 PUFFS EVERY 6 HOURS AS NEEDED FOR WHEEZING OR SHORTNESS OF BREATH 05/07/18   Terald Sleeper, PA-C    Allergies    Lisinopril and Nitrofuran derivatives  Review of Systems   Review of Systems  All other systems reviewed and are negative.   Physical Exam Updated Vital Signs BP 127/85    Pulse  72    Temp 99.7 F (37.6 C) (Oral)    Resp 16    Ht 1.626 m (5\' 4" )    Wt 62.1 kg    SpO2 97%    BMI 23.52 kg/m   Physical Exam Vitals and nursing note reviewed.  Constitutional:      General: She is not in acute distress.    Appearance: She is well-developed.  HENT:     Head: Normocephalic and atraumatic.     Right Ear: External ear normal.  Left Ear: External ear normal.  Eyes:     General: No scleral icterus.       Right eye: No discharge.        Left eye: No discharge.     Conjunctiva/sclera: Conjunctivae normal.  Neck:     Trachea: No tracheal deviation.  Cardiovascular:     Rate and Rhythm: Normal rate and regular rhythm.  Pulmonary:     Effort: Pulmonary effort is normal. No respiratory distress.     Breath sounds: Normal breath sounds. No stridor. No wheezing or rales.  Abdominal:     General: Bowel sounds are normal. There is no distension.     Palpations: Abdomen is soft.     Tenderness: There is no abdominal tenderness. There is no guarding or rebound.  Musculoskeletal:        General: No tenderness.     Cervical back: Neck supple.  Skin:    General: Skin is warm and dry.     Findings: No rash.  Neurological:     Mental Status: She is alert.     Cranial Nerves: No cranial nerve deficit (no facial droop, extraocular movements intact, no slurred speech).     Sensory: No sensory deficit.     Motor: No abnormal muscle tone or seizure activity.     Coordination: Coordination normal.     ED Results / Procedures / Treatments   Labs (all labs ordered are listed, but only abnormal results are displayed) Labs Reviewed  BASIC METABOLIC PANEL - Abnormal; Notable for the following components:      Result Value   Potassium 3.1 (*)    Glucose, Bld 104 (*)    Calcium 8.7 (*)    All other components within normal limits  CBC WITH DIFFERENTIAL/PLATELET    EKG EKG Interpretation  Date/Time:  Tuesday October 22 2019 11:22:47 EDT Ventricular Rate:  89 PR  Interval:  160 QRS Duration: 92 QT Interval:  374 QTC Calculation: 455 R Axis:   87 Text Interpretation: Normal sinus rhythm Cannot rule out Anterior infarct , age undetermined Abnormal ECG No significant change since last tracing Confirmed by Dorie Rank [phone]) on 10/22/2019 11:34:58 AM   Radiology DG Chest Port 1 View  Result Date: 10/22/2019 CLINICAL DATA:  Shortness of breath and fever. Reported recent COVID-19 positive EXAM: PORTABLE CHEST 1 VIEW COMPARISON:  March 29, 2016 FINDINGS: There is subtle ill-defined opacity in the right base. There is mild scarring in the left base and right upper lobe regions. Heart size and pulmonary vascularity are normal. No adenopathy. Postoperative changes noted in the left apex and medial left upper lobe regions. No appreciable bone lesions IMPRESSION: Subtle ill-defined opacity right base which may represent early atypical organism pneumonia. Mild scarring right upper lobe and left base. Stable cardiac silhouette. Postoperative changes noted on the left. No adenopathy appreciable. Electronically Signed   By: Lowella Grip III M.D.   On: 10/22/2019 11:45    Procedures .Critical Care Performed by: Dorie Rank, MD Authorized by: Dorie Rank, MD   Critical care provider statement:    Critical care time (minutes):  45   Critical care was time spent personally by me on the following activities:  Discussions with consultants, evaluation of patient's response to treatment, examination of patient, ordering and performing treatments and interventions, ordering and review of laboratory studies, ordering and review of radiographic studies, pulse oximetry, re-evaluation of patient's condition, obtaining history from patient or surrogate and review of old charts   (  including critical care time)  Medications Ordered in ED Medications  acetaminophen (TYLENOL) tablet 650 mg (has no administration in time range)  0.9 %  sodium chloride infusion (has no  administration in time range)  diphenhydrAMINE (BENADRYL) injection 50 mg (has no administration in time range)  famotidine (PEPCID) IVPB 20 mg premix (has no administration in time range)  methylPREDNISolone sodium succinate (SOLU-MEDROL) 125 mg/2 mL injection 125 mg (has no administration in time range)  albuterol (VENTOLIN HFA) 108 (90 Base) MCG/ACT inhaler 2 puff (has no administration in time range)  EPINEPHrine (EPI-PEN) injection 0.3 mg (has no administration in time range)  acetaminophen (TYLENOL) tablet 650 mg (650 mg Oral Given 10/22/19 1124)  casirivimab-imdevimab (REGEN-COV) 1,200 mg in sodium chloride 0.9 % 110 mL IVPB (0 mg Intravenous Stopped 10/22/19 1455)    ED Course  I have reviewed the triage vital signs and the nursing notes.  Pertinent labs & imaging results that were available during my care of the patient were reviewed by me and considered in my medical decision making (see chart for details).  Clinical Course as of Oct 21 1513  Tue Oct 22, 2019  1240 Has Covid pneumonia with risk factors.  She does not require oxygen at this time does not meet inpatient criteria.  Patient does meet criteria for monoclonal antibody effusion.  She is getting near the end of the 10-day window.  We will proceed with infusion here in the ED   [JK]    Clinical Course User Index [JK] Dorie Rank, MD   MDM Rules/Calculators/A&P                         [name] was evaluated in Emergency Department on 10/22/2019 for the symptoms described in the history of present illness. She was evaluated in the context of the global COVID-19 pandemic, which necessitated consideration that the patient might be at risk for infection with the SARS-CoV-2 virus that causes COVID-19. Institutional protocols and algorithms that pertain to the evaluation of patients at risk for COVID-19 are in a state of rapid change based on information released by regulatory bodies including the CDC and federal and state  organizations. These policies and algorithms were followed during the patient's care in the ED.  Patient presented to ED for evaluation of symptoms associated with known Covid diagnosis.  Patient fortunately remained stable.  Her oxygenation was 97% on room air.  She did have a transient dip while here in the ED but did remain above 90%.  Patient appears stable for discharge.  X-ray did show subtle findings of pneumonia.  Patient does have risk factors so she was given monoclonal antibody infusion.  Patient tolerated that without difficulty.  Plan on discharge home with supportive care.  Warning signs precautions discussed. Final Clinical Impression(s) / ED Diagnoses Final diagnoses:  Pneumonia due to COVID-19 virus    Rx / DC Orders ED Discharge Orders         Ordered    ondansetron (ZOFRAN ODT) 8 MG disintegrating tablet  Every 8 hours PRN        10/22/19 1514           Dorie Rank, MD 10/22/19 1516

## 2019-10-22 NOTE — Progress Notes (Signed)
Pharmacy COVID-19 Monoclonal Antibody Screening  [name] was identified as being not hospitalized with symptoms from Covid-19 on admission but an incidental positive PCR has been documented.  The patient may qualify for the use of monoclonal antibodies (mAB) for COVID-19 viral infection to prevent worsening symptoms stemming from Covid-19 infection.  The patient was identified based on a positive COVID-19 PCR and not requiring the use of supplemental oxygen at this time.  This patient meets the FDA criteria for Emergency Use Authorization of casirivimab/imdevimab or bamlanivimab/etesevimab.  Has a (+) direct SARS-CoV-2 viral test result  Is NOT hospitalized due to COVID-19  Is within 10 days of symptom onset  Has at least one of the high risk factor(s) for progression to severe COVID-19 and/or hospitalization as defined in EUA.  Specific high risk criteria : Chronic Lung Disease. 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  The patient is unvaccinated against COVID-19.  Since the patient is unvaccinated and meets high risk criteria, the patient is eligible for mAB administration.    Plan: Based on the above discussion, it was decided that the patient will receive one dose of the available COVID-19 mAB combination. Pharmacy will coordinate administration timing with patient's nurse. Recommended infusion monitoring parameters communicated to the nursing team.  Isac Sarna, BS Vena [place], BCPS Clinical Pharmacist Pager [phone] 10/22/2019  12:27 PM

## 2019-10-22 NOTE — Discharge Instructions (Addendum)
Your xray did show subtle findings of covid pneumonia.   Fortunately your oxygen level are stable.  You were given a treatment of the monoclonal antibody infusion (Regen-COV).  Take tylenol for fever.  Continue to try and eat and drink as best as you can.  Return to the ED for increasing difficulty with your breathing.

## 2019-11-05 ENCOUNTER — Ambulatory Visit: Payer: 59 | Admitting: Gastroenterology

## 2019-12-02 ENCOUNTER — Other Ambulatory Visit: Payer: Self-pay | Admitting: Nurse Practitioner

## 2019-12-02 DIAGNOSIS — I10 Essential (primary) hypertension: Secondary | ICD-10-CM

## 2019-12-02 DIAGNOSIS — F419 Anxiety disorder, unspecified: Secondary | ICD-10-CM

## 2019-12-03 ENCOUNTER — Telehealth: Payer: Self-pay

## 2019-12-03 NOTE — Telephone Encounter (Signed)
Patient's med refill for LORazepam (ATIVAN) 1 MG tablet was refused. The next available apt with [facility] is 12/23/2019. Pt scheduled the apt but said she cannot go without her medication for 21 days. Please call back if pt can get in sooner.

## 2019-12-04 NOTE — Telephone Encounter (Signed)
Appt scheduled for 12/05/19

## 2019-12-05 ENCOUNTER — Encounter: Payer: Self-pay | Admitting: Nurse Practitioner

## 2019-12-05 ENCOUNTER — Ambulatory Visit (INDEPENDENT_AMBULATORY_CARE_PROVIDER_SITE_OTHER): Payer: 59 | Admitting: Nurse Practitioner

## 2019-12-05 ENCOUNTER — Other Ambulatory Visit: Payer: Self-pay

## 2019-12-05 VITALS — BP 174/100 | HR 76 | Temp 97.9°F | Resp 20 | Ht 64.0 in | Wt 139.0 lb

## 2019-12-05 DIAGNOSIS — F419 Anxiety disorder, unspecified: Secondary | ICD-10-CM

## 2019-12-05 DIAGNOSIS — R350 Frequency of micturition: Secondary | ICD-10-CM

## 2019-12-05 DIAGNOSIS — G629 Polyneuropathy, unspecified: Secondary | ICD-10-CM

## 2019-12-05 DIAGNOSIS — K219 Gastro-esophageal reflux disease without esophagitis: Secondary | ICD-10-CM

## 2019-12-05 DIAGNOSIS — J410 Simple chronic bronchitis: Secondary | ICD-10-CM

## 2019-12-05 DIAGNOSIS — I10 Essential (primary) hypertension: Secondary | ICD-10-CM

## 2019-12-05 DIAGNOSIS — F339 Major depressive disorder, recurrent, unspecified: Secondary | ICD-10-CM

## 2019-12-05 DIAGNOSIS — M5030 Other cervical disc degeneration, unspecified cervical region: Secondary | ICD-10-CM | POA: Diagnosis not present

## 2019-12-05 DIAGNOSIS — R0600 Dyspnea, unspecified: Secondary | ICD-10-CM

## 2019-12-05 DIAGNOSIS — M5137 Other intervertebral disc degeneration, lumbosacral region: Secondary | ICD-10-CM

## 2019-12-05 MED ORDER — LORAZEPAM 1 MG PO TABS: 1.0000 mg | ORAL_TABLET | Freq: Every day | ORAL | 5 refills | Status: DC

## 2019-12-05 MED ORDER — LOSARTAN POTASSIUM 100 MG PO TABS: 100.0000 mg | ORAL_TABLET | Freq: Every day | ORAL | 3 refills | Status: DC

## 2019-12-05 MED ORDER — HYDROCHLOROTHIAZIDE 12.5 MG PO CAPS: 12.5000 mg | ORAL_CAPSULE | Freq: Every day | ORAL | 1 refills | Status: DC

## 2019-12-05 MED ORDER — HYDROCODONE-ACETAMINOPHEN 10-325 MG PO TABS: 2.0000 | ORAL_TABLET | Freq: Every day | ORAL | 0 refills | Status: DC | PRN

## 2019-12-05 MED ORDER — BUDESONIDE-FORMOTEROL FUMARATE 80-4.5 MCG/ACT IN AERO: 2.0000 | INHALATION_SPRAY | Freq: Two times a day (BID) | RESPIRATORY_TRACT | 1 refills | Status: DC

## 2019-12-05 MED ORDER — DILTIAZEM HCL ER COATED BEADS 180 MG PO CP24: ORAL_CAPSULE | ORAL | 0 refills | Status: DC

## 2019-12-05 MED ORDER — ALPRAZOLAM 0.5 MG PO TABS: 0.5000 mg | ORAL_TABLET | Freq: Two times a day (BID) | ORAL | 0 refills | Status: DC | PRN

## 2019-12-05 MED ORDER — ESCITALOPRAM OXALATE 10 MG PO TABS: 10.0000 mg | ORAL_TABLET | Freq: Every day | ORAL | 4 refills | Status: DC

## 2019-12-05 NOTE — Progress Notes (Signed)
Patient ID: [name], female   DOB: 1960-12-29, 59 y.o.   MRN: [id number] Subjective:    Patient ID: [name], female    DOB: 1960/04/11, 59 y.o.   MRN: [id number]   Chief Complaint: Medical Management of Chronic Issues (Discuss stopping ativan and restarting xanax)    HPI:  1. Primary hypertension Sometimes forgets to take the HCTZ but she has been doing better the last month. Does check BP at home and it has been high. Says she has a stressful job which she believes contributes to her high BP. Does not add salt to her food and does try to watch sodium intake. Denies chest pain, dizziness. Does report headaches, tinnitus, SOB but SOB is no worse than normal. BP Readings from Last 3 Encounters:  10/22/19 112/70  05/10/19 (!) 144/87  03/20/19 (!) 156/96    2. Simple chronic bronchitis (HCC) Has been coughing some with no sputum production. Is getting a low dose CT scan next month as part of a study. Has not needed to use albuterol inhaler except when she had covid in September.  3. Gastroesophageal reflux disease without esophagitis Sometimes has heartburn but takes nexium if she has symptoms.  4. DDD (degenerative disc disease), cervical Takes norco sometimes for shoulder pain. Does not take every day and will usually cut a pill into quarters and take one.  5. Anxiety Takes ativan to go to sleep. She takes 1/2 a pill every night. Says she would like to stop taking ativan and start taking xanax again which she was taking PRN for anxiety. States she has had more anxiety lately. GAD 7 : Generalized Anxiety Score 12/05/2019 05/10/2019 11/30/2018  Nervous, Anxious, on Edge 1 1 1   Control/stop worrying 1 1 0  Worry too much - different things 0 0 0  Trouble relaxing 1 1 0  Restless 0 0 0  Easily annoyed or irritable 1 1 1   Afraid - awful might happen 0 0 0  Total GAD 7 Score 4 4 2   Anxiety Difficulty Not difficult at all Not difficult at all Not difficult at all    6.  Neuropathy Is not having any burning or tingling in her feet.     Outpatient Encounter Medications as of 12/05/2019  Medication Sig  . budesonide-formoterol (SYMBICORT) 80-4.5 MCG/ACT inhaler Inhale 2 puffs into the lungs 2 (two) times daily.  Marland Kitchen diltiazem (CARDIZEM CD) 180 MG 24 hr capsule TAKE  (1)  CAPSULE  TWICE DAILY FOR HIGH BLOOD PRESSURE. (Needs to be seen before next refill)  . escitalopram (LEXAPRO) 10 MG tablet Take 1 tablet (10 mg total) by mouth daily.  . hydrochlorothiazide (MICROZIDE) 12.5 MG capsule Take 1 capsule (12.5 mg total) by mouth daily.  Marland Kitchen HYDROcodone-acetaminophen (NORCO) 10-325 MG tablet Take 2 tablets by mouth daily as needed.  Marland Kitchen LORazepam (ATIVAN) 1 MG tablet Take 1 tablet (1 mg total) by mouth at bedtime.  Marland Kitchen PROAIR HFA 108 (90 Base) MCG/ACT inhaler 2 PUFFS EVERY 6 HOURS AS NEEDED FOR WHEEZING OR SHORTNESS OF BREATH  . [DISCONTINUED] esomeprazole (NEXIUM) 40 MG capsule Take 1 capsule (40 mg total) by mouth daily at 12 noon.  . [DISCONTINUED] hydrocortisone (ANUSOL-HC) 2.5 % rectal cream Place 1 application rectally 2 (two) times daily.  . [DISCONTINUED] ondansetron (ZOFRAN ODT) 8 MG disintegrating tablet Take 1 tablet (8 mg total) by mouth every 8 (eight) hours as needed for nausea or vomiting.   No facility-administered encounter medications on file as of 12/05/2019.  Past Surgical History:  Procedure Laterality Date  . CHOLECYSTECTOMY N/A 04/01/2016   Procedure: LAPAROSCOPIC CHOLECYSTECTOMY;  Surgeon: Aviva Signs, MD;  Location: [facility];  Service: General;  Laterality: N/A;  . COLONOSCOPY  11/2010   normal colonoscopy from anal verge to ileocecal valve; no polyps, masses, AVMs, diverticula, or evidence of inflammation  . ESOPHAGOGASTRODUODENOSCOPY N/A 10/16/2013   OYD:XAJOINOM ring s/p dilation/2 cm HH. multiple 3 mm round and shallow erosions. Negative H pylori  . FOREIGN BODY REMOVAL N/A 09/08/2013   RMR:Mid esophageal stricture-likely  radiation-induced/Schatzki's ring. Hiatal hernia  . LAPAROSCOPIC APPENDECTOMY N/A 06/21/2013   Procedure: APPENDECTOMY LAPAROSCOPIC;  Surgeon: Jamesetta So, MD;  Location: [facility];  Service: General;  Laterality: N/A;  Venia Minks DILATION N/A 10/16/2013   Procedure: Keturah Shavers;  Surgeon: Daneil Dolin, MD;  Location: [facility];  Service: Endoscopy;  Laterality: N/A;  . PORTACATH PLACEMENT  [id number]   Rt   IJ  Port-A-Cath  [email or web address]  . STERNAL WIRES REMOVAL  01/23/2012   Procedure: STERNAL WIRES REMOVAL;  Surgeon: Grace Isaac, MD;  Location: [facility];  Service: Thoracic;  Laterality: N/A;  . THORACOTOMY  [id number]   transsmanubrial anterolaterl thoracotomy with chest wall resection includinf the first rib and left  upper lobectomy for Pancoast turmor  . TUBAL LIGATION      Family History  Problem Relation Age of Onset  . Cancer Mother        lung  . Hypertension Father   . Stroke Father   . Hypertension Sister   . Heart disease Sister   . Cancer Brother 68       pancreatic  . Colon cancer Neg Hx     New complaints: Has been having increased frequency, especially at night. She has started wearing a pad at work because she has had some dribbling if she can't get to the bathroom fast enough. Denies dysuria, hematuria.  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   HENT: Positive for congestion and tinnitus.   Eyes: Negative.   Respiratory: Positive for cough and shortness of breath.   Cardiovascular: Negative.   Gastrointestinal: Negative.   Genitourinary: Positive for frequency.  Musculoskeletal: Positive for back pain.  Skin: Negative.   Neurological: Positive for headaches.  Psychiatric/Behavioral: The patient is nervous/anxious.        Objective:   Physical Exam Vitals and nursing note reviewed.  Constitutional:      Appearance: Normal appearance.  HENT:      Head: Normocephalic.     Right Ear: Tympanic membrane normal.     Left Ear: Tympanic membrane normal.     Nose: Nose normal.     Mouth/Throat:     Mouth: Mucous membranes are moist.     Pharynx: Oropharynx is clear.  Eyes:     Conjunctiva/sclera: Conjunctivae normal.     Pupils: Pupils are equal, round, and reactive to light.  Cardiovascular:     Rate and Rhythm: Normal rate and regular rhythm.     Pulses: Normal pulses.     Heart sounds: Normal heart sounds.  Pulmonary:     Effort: Pulmonary effort is normal.     Breath sounds: Normal breath sounds.  Abdominal:     General: Bowel sounds are normal.     Palpations: Abdomen is soft.  Musculoskeletal:        General: Normal range of motion.  Cervical back: Normal range of motion.  Skin:    General: Skin is warm and dry.  Neurological:     General: No focal deficit present.     Mental Status: She is alert and oriented to person, place, and time.  Psychiatric:        Mood and Affect: Mood normal.        Behavior: Behavior normal.    BP (!) 170/101   Pulse 76   Temp 97.9 F (36.6 C) (Temporal)   Resp 20   Ht 5\' 4"  (1.626 m)   Wt 139 lb (63 kg)   SpO2 95%   BMI 23.86 kg/m      Assessment & Plan:  [name] comes in today with chief complaint of Medical Management of Chronic Issues (Discuss stopping ativan and restarting xanax)   Diagnosis and orders addressed:  1. Primary hypertension Low sodium diet - losartan (COZAAR) 100 MG tablet; Take 1 tablet (100 mg total) by mouth daily.  Dispense: 90 tablet; Refill: 3 - diltiazem (CARDIZEM CD) 180 MG 24 hr capsule; TAKE  (1)  CAPSULE  TWICE DAILY FOR HIGH BLOOD PRESSURE. (Needs to be seen before next refill)  Dispense: 60 capsule; Refill: 0 - hydrochlorothiazide (MICROZIDE) 12.5 MG capsule; Take 1 capsule (12.5 mg total) by mouth daily.  Dispense: 90 capsule; Refill: 1  2. Simple chronic bronchitis (HCC) Continue using symbicort - budesonide-formoterol  (SYMBICORT) 80-4.5 MCG/ACT inhaler; Inhale 2 puffs into the lungs 2 (two) times daily.  Dispense: 3 each; Refill: 1 3. Gastroesophageal reflux disease without esophagitis Avoid spicy foods Do not eat 2 hours prior to bedtime  4. DDD (degenerative disc disease), cervical Pain meds as needed Take sparingly  5. Anxiety Stress management - LORazepam (ATIVAN) 1 MG tablet; Take 1 tablet (1 mg total) by mouth at bedtime.  Dispense: 30 tablet; Refill: 5 DC'D - ALPRAZolam (XANAX) 0.5 MG tablet; Take 1 tablet (0.5 mg total) by mouth 2 (two) times daily as needed for anxiety.  Dispense: 60 tablet; Refill: 0  6. Neuropathy  7. Depression, recurrent ([place]) Stress mangement - escitalopram (LEXAPRO) 10 MG tablet; Take 1 tablet (10 mg total) by mouth daily.  Dispense: 30 tablet; Refill: 4   8. Degeneration of lumbar or lumbosacral intervertebral disc Moist heat  Rest If hurts to do something stop doing it - HYDROcodone-acetaminophen (NORCO) 10-325 MG tablet; Take 2 tablets by mouth daily as needed.  Dispense: 40 tablet; Refill: 0   Labs pending Health Maintenance reviewed Diet and exercise encouraged  Follow up plan: 6 months   Mary-Margaret Hassell Done, FNP

## 2019-12-05 NOTE — Addendum Note (Signed)
Addended by: Chevis Pretty on: 12/05/2019 05:04 PM   Modules accepted: Orders

## 2019-12-05 NOTE — Patient Instructions (Signed)
Acute Back Pain, Adult Acute back pain is sudden and usually short-lived. It is often caused by an injury to the muscles and tissues in the back. The injury may result from:  A muscle or ligament getting overstretched or torn (strained). Ligaments are tissues that connect bones to each other. Lifting something improperly can cause a back strain.  Wear and tear (degeneration) of the spinal disks. Spinal disks are circular tissue that provides cushioning between the bones of the spine (vertebrae).  Twisting motions, such as while playing sports or doing yard work.  A hit to the back.  Arthritis. You may have a physical exam, lab tests, and imaging tests to find the cause of your pain. Acute back pain usually goes away with rest and home care. Follow these instructions at home: Managing pain, stiffness, and swelling  Take over-the-counter and prescription medicines only as told by your health care provider.  Your health care provider may recommend applying ice during the first 24-48 hours after your pain starts. To do this: ? Put ice in a plastic bag. ? Place a towel between your skin and the bag. ? Leave the ice on for 20 minutes, 2-3 times a day.  If directed, apply heat to the affected area as often as told by your health care provider. Use the heat source that your health care provider recommends, such as a moist heat pack or a heating pad. ? Place a towel between your skin and the heat source. ? Leave the heat on for 20-30 minutes. ? Remove the heat if your skin turns bright red. This is especially important if you are unable to feel pain, heat, or cold. You have a greater risk of getting burned. Activity   Do not stay in bed. Staying in bed for more than 1-2 days can delay your recovery.  Sit up and stand up straight. Avoid leaning forward when you sit, or hunching over when you stand. ? If you work at a desk, sit close to it so you do not need to lean over. Keep your chin tucked  in. Keep your neck drawn back, and keep your elbows bent at a right angle. Your arms should look like the letter "L." ? Sit high and close to the steering wheel when you drive. Add lower back (lumbar) support to your car seat, if needed.  Take short walks on even surfaces as soon as you are able. Try to increase the length of time you walk each day.  Do not sit, drive, or stand in one place for more than 30 minutes at a time. Sitting or standing for long periods of time can put stress on your back.  Do not drive or use heavy machinery while taking prescription pain medicine.  Use proper lifting techniques. When you bend and lift, use positions that put less stress on your back: ? Bend your knees. ? Keep the load close to your body. ? Avoid twisting.  Exercise regularly as told by your health care provider. Exercising helps your back heal faster and helps prevent back injuries by keeping muscles strong and flexible.  Work with a physical therapist to make a safe exercise program, as recommended by your health care provider. Do any exercises as told by your physical therapist. Lifestyle  Maintain a healthy weight. Extra weight puts stress on your back and makes it difficult to have good posture.  Avoid activities or situations that make you feel anxious or stressed. Stress and anxiety increase muscle   tension and can make back pain worse. Learn ways to manage anxiety and stress, such as through exercise. General instructions  Sleep on a firm mattress in a comfortable position. Try lying on your side with your knees slightly bent. If you lie on your back, put a pillow under your knees.  Follow your treatment plan as told by your health care provider. This may include: ? Cognitive or behavioral therapy. ? Acupuncture or massage therapy. ? Meditation or yoga. Contact a health care provider if:  You have pain that is not relieved with rest or medicine.  You have increasing pain going down  into your legs or buttocks.  Your pain does not improve after 2 weeks.  You have pain at night.  You lose weight without trying.  You have a fever or chills. Get help right away if:  You develop new bowel or bladder control problems.  You have unusual weakness or numbness in your arms or legs.  You develop nausea or vomiting.  You develop abdominal pain.  You feel faint. Summary  Acute back pain is sudden and usually short-lived.  Use proper lifting techniques. When you bend and lift, use positions that put less stress on your back.  Take over-the-counter and prescription medicines and apply heat or ice as directed by your health care provider. This information is not intended to replace advice given to you by your health care provider. Make sure you discuss any questions you have with your health care provider. Document Revised: 05/01/2018 Document Reviewed: 08/24/2016 Elsevier Patient Education  2020 [employer].  

## 2019-12-06 LAB — CMP14+EGFR
ALT: 39 IU/L — ABNORMAL HIGH (ref 0–32)
AST: 45 IU/L — ABNORMAL HIGH (ref 0–40)
Albumin/Globulin Ratio: 1.7 (ref 1.2–2.2)
Albumin: 4.4 g/dL (ref 3.8–4.9)
Alkaline Phosphatase: 152 IU/L — ABNORMAL HIGH (ref 44–121)
BUN/Creatinine Ratio: 22 (ref 12–28)
BUN: 19 mg/dL (ref 8–27)
Bilirubin Total: 0.3 mg/dL (ref 0.0–1.2)
CO2: 28 mmol/L (ref 20–29)
Calcium: 9.9 mg/dL (ref 8.7–10.3)
Chloride: 102 mmol/L (ref 96–106)
Creatinine, Ser: 0.85 mg/dL (ref 0.57–1.00)
GFR calc Af Amer: 86 mL/min/{1.73_m2} (ref >59)
GFR calc non Af Amer: 75 mL/min/{1.73_m2} (ref >59)
Globulin, Total: 2.6 g/dL (ref 1.5–4.5)
Glucose: 98 mg/dL (ref 65–99)
Potassium: 3.1 mmol/L — ABNORMAL LOW (ref 3.5–5.2)
Sodium: 143 mmol/L (ref 134–144)
Total Protein: 7 g/dL (ref 6.0–8.5)

## 2019-12-06 LAB — URINALYSIS, COMPLETE
Bilirubin, UA: NEGATIVE
Glucose, UA: NEGATIVE
Ketones, UA: NEGATIVE
Leukocytes,UA: NEGATIVE
Nitrite, UA: NEGATIVE
Protein,UA: NEGATIVE
RBC, UA: NEGATIVE
Specific Gravity, UA: 1.019 (ref 1.005–1.030)
Urobilinogen, Ur: 0.2 mg/dL (ref 0.2–1.0)
pH, UA: 6.5 (ref 5.0–7.5)

## 2019-12-06 LAB — LIPID PANEL
Chol/HDL Ratio: 4 ratio (ref 0.0–4.4)
Cholesterol, Total: 233 mg/dL — ABNORMAL HIGH (ref 100–199)
HDL: 58 mg/dL (ref >39)
LDL Chol Calc (NIH): 121 mg/dL — ABNORMAL HIGH (ref 0–99)
Triglycerides: 310 mg/dL — ABNORMAL HIGH (ref 0–149)
VLDL Cholesterol Cal: 54 mg/dL — ABNORMAL HIGH (ref 5–40)

## 2019-12-06 LAB — CBC WITH DIFFERENTIAL/PLATELET
Basophils Absolute: 0.1 10*3/uL (ref 0.0–0.2)
Basos: 2 %
EOS (ABSOLUTE): 0.9 10*3/uL — ABNORMAL HIGH (ref 0.0–0.4)
Eos: 11 %
Hematocrit: 36.8 % (ref 34.0–46.6)
Hemoglobin: 12.7 g/dL (ref 11.1–15.9)
Immature Grans (Abs): 0 10*3/uL (ref 0.0–0.1)
Immature Granulocytes: 1 %
Lymphocytes Absolute: 2.1 10*3/uL (ref 0.7–3.1)
Lymphs: 27 %
MCH: 29.5 pg (ref 26.6–33.0)
MCHC: 34.5 g/dL (ref 31.5–35.7)
MCV: 86 fL (ref 79–97)
Monocytes Absolute: 0.6 10*3/uL (ref 0.1–0.9)
Monocytes: 8 %
Neutrophils Absolute: 4.3 10*3/uL (ref 1.4–7.0)
Neutrophils: 51 %
Platelets: 376 10*3/uL (ref 150–450)
RBC: 4.3 x10E6/uL (ref 3.77–5.28)
RDW: 12.8 % (ref 11.7–15.4)
WBC: 8.1 10*3/uL (ref 3.4–10.8)

## 2019-12-06 LAB — MICROSCOPIC EXAMINATION
Bacteria, UA: NONE SEEN
Casts: NONE SEEN /lpf
Epithelial Cells (non renal): NONE SEEN /hpf (ref 0–10)
WBC, UA: NONE SEEN /hpf (ref 0–5)

## 2019-12-07 LAB — URINE CULTURE

## 2019-12-09 ENCOUNTER — Telehealth: Payer: Self-pay | Admitting: Nurse Practitioner

## 2019-12-09 NOTE — Telephone Encounter (Signed)
No cannot do ambien and xanax

## 2019-12-09 NOTE — Telephone Encounter (Signed)
She can add melatonin OTC

## 2019-12-09 NOTE — Telephone Encounter (Signed)
Patient aware- would like to know if she can take anything else with Xanax to help her sleep?

## 2019-12-09 NOTE — Telephone Encounter (Signed)
Lmtcb.

## 2019-12-09 NOTE — Telephone Encounter (Signed)
Patient was taken off ativan and put on xanax.  Patient states she still can not sleep and tried melatonin over the weekend.  Would like to know if she can get prescribed Ambien with her xanax ?

## 2019-12-09 NOTE — Telephone Encounter (Signed)
Pt gave no information, stated she was taken off of a prescription and wanted to know if she could be put on another

## 2019-12-12 ENCOUNTER — Encounter: Payer: Self-pay | Admitting: Acute Care

## 2019-12-23 ENCOUNTER — Ambulatory Visit: Payer: 59 | Admitting: Nurse Practitioner

## 2020-01-01 ENCOUNTER — Ambulatory Visit: Payer: 59 | Admitting: *Deleted

## 2020-01-01 VITALS — BP 144/80 | HR 87

## 2020-01-01 DIAGNOSIS — I10 Essential (primary) hypertension: Secondary | ICD-10-CM

## 2020-01-01 NOTE — Progress Notes (Signed)
Patient came in office for Blood pressure check.  Patient states that she had a stressful day yesterday and an hour after she got home she had a BP reading of 200/105, bedtime 182/104 and this morning before taking medications 152/103.  Here in office BP was 144/80 with 3 checks BP stayed in this range  140s/80s.  Patient states that she had a headache last night but has eased off without taking any pain relieving medication.  Advised patient to continue to check BPs at home and if regularly greater than 140s/90s to contact this office.  Stroke symptoms reviewed with patient and advised to call EMS if she experiences any of these symptoms.

## 2020-01-09 ENCOUNTER — Ambulatory Visit
Admission: RE | Admit: 2020-01-09 | Discharge: 2020-01-09 | Disposition: A | Discharge status: Home / Self Care | Payer: 59 | Source: Ambulatory Visit | Attending: Acute Care | Admitting: Acute Care

## 2020-01-09 ENCOUNTER — Other Ambulatory Visit: Payer: Self-pay

## 2020-01-09 DIAGNOSIS — Z87891 Personal history of nicotine dependence: Secondary | ICD-10-CM

## 2020-01-23 NOTE — Progress Notes (Signed)
Please call patient and let them  know their  low dose Ct was read as a Lung RADS 1, negative study: no nodules or definitely benign nodules. Radiology recommendation is for a repeat LDCT in 12 months. .Please let them  know we will order and schedule their  annual screening scan for 12/2020. Please let them  know there was notation of CAD on their  scan.  Please remind the patient  that this is a non-gated exam therefore degree or severity of disease  cannot be determined. Please have them  follow up with their PCP regarding potential risk factor modification, dietary therapy or pharmacologic therapy if clinically indicated. Pt.  is  not currently on statin therapy. Please place order for annual  screening scan for  12/2020 and fax results to PCP. Thanks so much.

## 2020-01-28 ENCOUNTER — Other Ambulatory Visit: Payer: Self-pay | Admitting: *Deleted

## 2020-01-28 DIAGNOSIS — Z87891 Personal history of nicotine dependence: Secondary | ICD-10-CM

## 2020-02-18 ENCOUNTER — Telehealth: Payer: Self-pay

## 2020-02-18 NOTE — Telephone Encounter (Signed)
Pt called requesting to switch providers. Pt has [facility] listed as her PCP but pt says [facility] is not personable with her and she wants to switch to [facility] [facility].  Please advise and call patient.

## 2020-02-18 NOTE — Telephone Encounter (Signed)
Waiting for [facility] response as well

## 2020-02-18 NOTE — Telephone Encounter (Signed)
Please advise, patient does have controlled substance agreement.

## 2020-02-18 NOTE — Telephone Encounter (Signed)
It is against office policy to switch providers with a controlled substance.

## 2020-03-03 ENCOUNTER — Other Ambulatory Visit: Payer: Self-pay | Admitting: Nurse Practitioner

## 2020-03-03 DIAGNOSIS — F419 Anxiety disorder, unspecified: Secondary | ICD-10-CM

## 2020-03-04 NOTE — Telephone Encounter (Signed)
Pt called - will need RF on meds soon  If she has to pick between lorazepam and xanax- she prefers the xanax, BUT dose will have to be changed.  Currently only takes PRN  (lorazepan used for sleep in past)   Aware we can only do one.    Spoke with Corene Cornea and we will schedule with a different PCP going forward.   appt made for next week with JE for re-est with new PCP per Corene Cornea

## 2020-03-04 NOTE — Telephone Encounter (Signed)
[name] called to check on:   Xanax request was a pt request(not auto rf)   Last fill lorazepam on their end was 04/2019.  Per Suanne Marker at [facility] - they never received the 12/05/19 lorazepam rx sent by [facility] (even though receipt confirmed they did on our end)   All she has had was xanax since 12/05/19.   IF you meant for her to switch to lorazepam, can you resend?

## 2020-03-04 NOTE — Telephone Encounter (Signed)
She cannot have refill on xanax. We only gave 1 prescription because she requested ativan as well and I told her at appointment she could not continue to take both ativan and xanax. She has refill on ativan.

## 2020-03-09 ENCOUNTER — Other Ambulatory Visit: Payer: Self-pay

## 2020-03-09 ENCOUNTER — Ambulatory Visit (INDEPENDENT_AMBULATORY_CARE_PROVIDER_SITE_OTHER): Payer: 59 | Admitting: Nurse Practitioner

## 2020-03-09 ENCOUNTER — Encounter: Payer: Self-pay | Admitting: Nurse Practitioner

## 2020-03-09 VITALS — BP 134/81 | HR 70 | Temp 97.6°F | Ht 64.0 in | Wt 145.2 lb

## 2020-03-09 DIAGNOSIS — I10 Essential (primary) hypertension: Secondary | ICD-10-CM | POA: Diagnosis not present

## 2020-03-09 DIAGNOSIS — F419 Anxiety disorder, unspecified: Secondary | ICD-10-CM

## 2020-03-09 MED ORDER — ALPRAZOLAM 0.5 MG PO TABS: 0.5000 mg | ORAL_TABLET | Freq: Two times a day (BID) | ORAL | 0 refills | Status: DC | PRN

## 2020-03-09 NOTE — Assessment & Plan Note (Signed)
Anxiety symptoms controlled on 0.5 mg Xanax.  Refill sent to pharmacy.  Follow-up with uncontrolled or worsening symptoms.

## 2020-03-09 NOTE — Patient Instructions (Signed)
[email or web address]">  Generalized Anxiety Disorder, Adult Generalized anxiety disorder (GAD) is a mental health condition. Unlike normal worries, anxiety related to GAD is not triggered by a specific event. These worries do not fade or get better with time. GAD interferes with relationships, work, and school. GAD symptoms can vary from mild to severe. People with severe GAD can have intense waves of anxiety with physical symptoms that are similar to panic attacks. What are the causes? The exact cause of GAD is not known, but the following are believed to have an impact:  Differences in natural brain chemicals.  Genes passed down from parents to children.  Differences in the way threats are perceived.  Development during childhood.  Personality. What increases the risk? The following factors may make you more likely to develop this condition:  Being female.  Having a family history of anxiety disorders.  Being very shy.  Experiencing very stressful life events, such as the death of a loved one.  Having a very stressful family environment. What are the signs or symptoms? People with GAD often worry excessively about many things in their lives, such as their health and family. Symptoms may also include:  Mental and emotional symptoms: ? Worrying excessively about natural disasters. ? Fear of being late. ? Difficulty concentrating. ? Fears that others are judging your performance.  Physical symptoms: ? Fatigue. ? Headaches, muscle tension, muscle twitches, trembling, or feeling shaky. ? Feeling like your heart is pounding or beating very fast. ? Feeling out of breath or like you cannot take a deep breath. ? Having trouble falling asleep or staying asleep, or experiencing restlessness. ? Sweating. ? Nausea, diarrhea, or irritable bowel syndrome (IBS).  Behavioral symptoms: ? Experiencing erratic moods or irritability. ? Avoidance of new situations. ? Avoidance of  people. ? Extreme difficulty making decisions. How is this diagnosed? This condition is diagnosed based on your symptoms and medical history. You will also have a physical exam. Your health care provider may perform tests to rule out other possible causes of your symptoms. To be diagnosed with GAD, a person must have anxiety that:  Is out of his or her control.  Affects several different aspects of his or her life, such as work and relationships.  Causes distress that makes him or her unable to take part in normal activities.  Includes at least three symptoms of GAD, such as restlessness, fatigue, trouble concentrating, irritability, muscle tension, or sleep problems. Before your health care provider can confirm a diagnosis of GAD, these symptoms must be present more days than they are not, and they must last for 6 months or longer. How is this treated? This condition may be treated with:  Medicine. Antidepressant medicine is usually prescribed for long-term daily control. Anti-anxiety medicines may be added in severe cases, especially when panic attacks occur.  Talk therapy (psychotherapy). Certain types of talk therapy can be helpful in treating GAD by providing support, education, and guidance. Options include: ? Cognitive behavioral therapy (CBT). People learn coping skills and self-calming techniques to ease their physical symptoms. They learn to identify unrealistic thoughts and behaviors and to replace them with more appropriate thoughts and behaviors. ? Acceptance and commitment therapy (ACT). This treatment teaches people how to be mindful as a way to cope with unwanted thoughts and feelings. ? Biofeedback. This process trains you to manage your body's response (physiological response) through breathing techniques and relaxation methods. You will work with a therapist while machines are used to monitor your physical  symptoms.  Stress management techniques. These include yoga,  meditation, and exercise. A mental health specialist can help determine which treatment is best for you. Some people see improvement with one type of therapy. However, other people require a combination of therapies.   Follow these instructions at home: Lifestyle  Maintain a consistent routine and schedule.  Anticipate stressful situations. Create a plan, and allow extra time to work with your plan.  Practice stress management or self-calming techniques that you have learned from your therapist or your health care provider. General instructions  Take over-the-counter and prescription medicines only as told by your health care provider.  Understand that you are likely to have setbacks. Accept this and be kind to yourself as you persist to take better care of yourself.  Recognize and accept your accomplishments, even if you judge them as small.  Keep all follow-up visits as told by your health care provider. This is important. Contact a health care provider if:  Your symptoms do not get better.  Your symptoms get worse.  You have signs of depression, such as: ? A persistently sad or irritable mood. ? Loss of enjoyment in activities that used to bring you joy. ? Change in weight or eating. ? Changes in sleeping habits. ? Avoiding friends or family members. ? Loss of energy for normal tasks. ? Feelings of guilt or worthlessness. Get help right away if:  You have serious thoughts about hurting yourself or others. If you ever feel like you may hurt yourself or others, or have thoughts about taking your own life, get help right away. Go to your nearest emergency department or:  Call your local emergency services (911 in the [place]  Call a suicide crisis helpline, such as the [place] at 567-597-7968. This is open 24 hours a day in the [place]  Text the Crisis Text Line at 802 728 1178 (in the [place] Summary  Generalized anxiety disorder (GAD) is a mental  health condition that involves worry that is not triggered by a specific event.  People with GAD often worry excessively about many things in their lives, such as their health and family.  GAD may cause symptoms such as restlessness, trouble concentrating, sleep problems, frequent sweating, nausea, diarrhea, headaches, and trembling or muscle twitching.  A mental health specialist can help determine which treatment is best for you. Some people see improvement with one type of therapy. However, other people require a combination of therapies. This information is not intended to replace advice given to you by your health care provider. Make sure you discuss any questions you have with your health care provider. Document Revised: 10/31/2018 Document Reviewed: 10/31/2018 Elsevier Patient Education  2021 [employer]. Hypertension, Adult Hypertension is another name for high blood pressure. High blood pressure forces your heart to work harder to pump blood. This can cause problems over time. There are two numbers in a blood pressure reading. There is a top number (systolic) over a bottom number (diastolic). It is best to have a blood pressure that is below 120/80. Healthy choices can help lower your blood pressure, or you may need medicine to help lower it. What are the causes? The cause of this condition is not known. Some conditions may be related to high blood pressure. What increases the risk?  Smoking.  Having type 2 diabetes mellitus, high cholesterol, or both.  Not getting enough exercise or physical activity.  Being overweight.  Having too much fat, sugar, calories, or salt (sodium) in  your diet.  Drinking too much alcohol.  Having long-term (chronic) kidney disease.  Having a family history of high blood pressure.  Age. Risk increases with age.  Race. You may be at higher risk if you are African American.  Gender. Men are at higher risk than women before age 61. After age  30, women are at higher risk than men.  Having obstructive sleep apnea.  Stress. What are the signs or symptoms?  High blood pressure may not cause symptoms. Very high blood pressure (hypertensive crisis) may cause: ? Headache. ? Feelings of worry or nervousness (anxiety). ? Shortness of breath. ? Nosebleed. ? A feeling of being sick to your stomach (nausea). ? Throwing up (vomiting). ? Changes in how you see. ? Very bad chest pain. ? Seizures. How is this treated?  This condition is treated by making healthy lifestyle changes, such as: ? Eating healthy foods. ? Exercising more. ? Drinking less alcohol.  Your health care provider may prescribe medicine if lifestyle changes are not enough to get your blood pressure under control, and if: ? Your top number is above 130. ? Your bottom number is above 80.  Your personal target blood pressure may vary. Follow these instructions at home: Eating and drinking  If told, follow the DASH eating plan. To follow this plan: ? Fill one half of your plate at each meal with fruits and vegetables. ? Fill one fourth of your plate at each meal with whole grains. Whole grains include whole-wheat pasta, brown rice, and whole-grain bread. ? Eat or drink low-fat dairy products, such as skim milk or low-fat yogurt. ? Fill one fourth of your plate at each meal with low-fat (lean) proteins. Low-fat proteins include fish, chicken without skin, eggs, beans, and tofu. ? Avoid fatty meat, cured and processed meat, or chicken with skin. ? Avoid pre-made or processed food.  Eat less than 1,500 mg of salt each day.  Do not drink alcohol if: ? Your doctor tells you not to drink. ? You are pregnant, may be pregnant, or are planning to become pregnant.  If you drink alcohol: ? Limit how much you use to:  0-1 drink a day for women.  0-2 drinks a day for men. ? Be aware of how much alcohol is in your drink. In the [place] one drink equals one 12 oz bottle  of beer (355 mL), one 5 oz glass of wine (148 mL), or one 1 oz glass of hard liquor (44 mL).   Lifestyle  Work with your doctor to stay at a healthy weight or to lose weight. Ask your doctor what the best weight is for you.  Get at least 30 minutes of exercise most days of the week. This may include walking, swimming, or biking.  Get at least 30 minutes of exercise that strengthens your muscles (resistance exercise) at least 3 days a week. This may include lifting weights or doing Pilates.  Do not use any products that contain nicotine or tobacco, such as cigarettes, e-cigarettes, and chewing tobacco. If you need help quitting, ask your doctor.  Check your blood pressure at home as told by your doctor.  Keep all follow-up visits as told by your doctor. This is important.   Medicines  Take over-the-counter and prescription medicines only as told by your doctor. Follow directions carefully.  Do not skip doses of blood pressure medicine. The medicine does not work as well if you skip doses. Skipping doses also puts you at risk  for problems.  Ask your doctor about side effects or reactions to medicines that you should watch for. Contact a doctor if you:  Think you are having a reaction to the medicine you are taking.  Have headaches that keep coming back (recurring).  Feel dizzy.  Have swelling in your ankles.  Have trouble with your vision. Get help right away if you:  Get a very bad headache.  Start to feel mixed up (confused).  Feel weak or numb.  Feel faint.  Have very bad pain in your: ? Chest. ? Belly (abdomen).  Throw up more than once.  Have trouble breathing. Summary  Hypertension is another name for high blood pressure.  High blood pressure forces your heart to work harder to pump blood.  For most people, a normal blood pressure is less than 120/80.  Making healthy choices can help lower blood pressure. If your blood pressure does not get lower with  healthy choices, you may need to take medicine. This information is not intended to replace advice given to you by your health care provider. Make sure you discuss any questions you have with your health care provider. Document Revised: 09/20/2017 Document Reviewed: 09/20/2017 Elsevier Patient Education  2021 [employer].

## 2020-03-09 NOTE — Assessment & Plan Note (Signed)
Hypertension symptoms well controlled on Cozaar 100 mg tablet daily by mouth. Continue low-sodium diet and exercise as tolerated.  Follow-up in 3 months

## 2020-03-09 NOTE — Progress Notes (Signed)
Established Patient Office Visit  Subjective:  Patient ID: [name], female    DOB: [birth year]  Age: 60 y.o. MRN: [id number]  CC:  Chief Complaint  Patient presents with  . Hypertension  . Anxiety    HPI [name] presents for follow up of hypertension. Patient was diagnosed few years ago. The patient is tolerating the medication well without side effects. Compliance with treatment has been good; including taking medication as directed , maintains a healthy diet and regular exercise regimen , and following up as directed. Current medication Cozaar 100 mg tablet by mouth daily.   Anxiety: Patient complains of anxiety disorder.  She has the following symptoms: difficulty concentrating, insomnia, irritable. Onset of symptoms was approximately a few years ago, unchanged since that time. She denies current suicidal and homicidal ideation. Family history significant for no psychiatric illness.Possible organic causes contributing are: none. Risk factors:  Previous treatment includes Xanax She complains of the following side effects from the treatment: none.   Past Medical History:  Diagnosis Date  . Anxiety   . Arthritis   . Constipation 02/17/2015  . Emphysema lung ([place])   . Fibromyalgia   . GERD (gastroesophageal reflux disease)   . Hypertension   . Lung cancer ([place]) 2012   pancoast tumor  . Menopausal vaginal dryness 02/17/2015    Past Surgical History:  Procedure Laterality Date  . CHOLECYSTECTOMY N/A 04/01/2016   Procedure: LAPAROSCOPIC CHOLECYSTECTOMY;  Surgeon: Aviva Signs, MD;  Location: [facility];  Service: General;  Laterality: N/A;  . COLONOSCOPY  11/2010   normal colonoscopy from anal verge to ileocecal valve; no polyps, masses, AVMs, diverticula, or evidence of inflammation  . ESOPHAGOGASTRODUODENOSCOPY N/A 10/16/2013   KGM:WNUUVOZD ring s/p dilation/2 cm HH. multiple 3 mm round and shallow erosions. Negative H pylori  . 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 Hiatal hernia  . LAPAROSCOPIC APPENDECTOMY N/A 06/21/2013   Procedure: APPENDECTOMY LAPAROSCOPIC;  Surgeon: Jamesetta So, MD;  Location: [facility];  Service: General;  Laterality: N/A;  Venia Minks DILATION N/A 10/16/2013   Procedure: Keturah Shavers;  Surgeon: Daneil Dolin, MD;  Location: [facility];  Service: Endoscopy;  Laterality: N/A;  . PORTACATH PLACEMENT  [id number]   Rt   IJ  Port-A-Cath  [email or web address]  . STERNAL WIRES REMOVAL  01/23/2012   Procedure: STERNAL WIRES REMOVAL;  Surgeon: Grace Isaac, MD;  Location: [place];  Service: Thoracic;  Laterality: N/A;  . THORACOTOMY  [id number]   transsmanubrial anterolaterl thoracotomy with chest wall resection includinf the first rib and left  upper lobectomy for Pancoast turmor  . TUBAL LIGATION      Family History  Problem Relation Age of Onset  . Cancer Mother        lung  . Hypertension Father   . Stroke Father   . Hypertension Sister   . Heart disease Sister   . Cancer Brother 82       pancreatic  . Colon cancer Neg Hx     Social History   Socioeconomic History  . Marital status: Married    Spouse name: Not on file  . Number of children: Not on file  . Years of education: Not on file  . Highest education level: Not on file  Occupational History  . Not on file  Tobacco Use  . Smoking status: Former Smoker    Packs/day: 1.50    Years: 35.00    Pack  years: 52.50    Types: Cigarettes    Quit date: 02/04/2010    Years since quitting: 10.0  . Smokeless tobacco: Never Used  . Tobacco comment: Quit x 8 years  Vaping Use  . Vaping Use: Never used  Substance and Sexual Activity  . Alcohol use: Yes    Alcohol/week: 0.0 standard drinks    Comment: occasionally   . Drug use: No  . Sexual activity: Yes    Birth control/protection: Post-menopausal  Other Topics Concern  . Not on file  Social History Narrative  . Not on file   Social Determinants of  Health   Financial Resource Strain: Not on file  Food Insecurity: Not on file  Transportation Needs: Not on file  Physical Activity: Not on file  Stress: Not on file  Social Connections: Not on file  Intimate Partner Violence: Not on file    Outpatient Medications Prior to Visit  Medication Sig Dispense Refill  . budesonide-formoterol (SYMBICORT) 80-4.5 MCG/ACT inhaler Inhale 2 puffs into the lungs 2 (two) times daily. 3 each 1  . diltiazem (CARDIZEM CD) 180 MG 24 hr capsule TAKE  (1)  CAPSULE  TWICE DAILY FOR HIGH BLOOD PRESSURE. (Needs to be seen before next refill) 60 capsule 0  . escitalopram (LEXAPRO) 10 MG tablet Take 1 tablet (10 mg total) by mouth daily. 30 tablet 4  . hydrochlorothiazide (MICROZIDE) 12.5 MG capsule Take 1 capsule (12.5 mg total) by mouth daily. 90 capsule 1  . HYDROcodone-acetaminophen (NORCO) 10-325 MG tablet Take 2 tablets by mouth daily as needed. 40 tablet 0  . LORazepam (ATIVAN) 1 MG tablet Take 1 tablet (1 mg total) by mouth at bedtime. 30 tablet 5  . losartan (COZAAR) 100 MG tablet Take 1 tablet (100 mg total) by mouth daily. 90 tablet 3  . PROAIR HFA 108 (90 Base) MCG/ACT inhaler 2 PUFFS EVERY 6 HOURS AS NEEDED FOR WHEEZING OR SHORTNESS OF BREATH 8.5 g 1  . ALPRAZolam (XANAX) 0.5 MG tablet Take 1 tablet (0.5 mg total) by mouth 2 (two) times daily as needed for anxiety. 60 tablet 0   No facility-administered medications prior to visit.    Allergies  Allergen Reactions  . Lisinopril Swelling    Of lips  . Nitrofuran Derivatives     ROS Review of Systems  Constitutional: Negative.   HENT: Negative.   Respiratory: Negative.   Cardiovascular: Negative.   Gastrointestinal: Negative.   Genitourinary: Negative.   Musculoskeletal: Negative.   Skin: Negative.   Neurological: Negative.   Psychiatric/Behavioral: Negative for self-injury, sleep disturbance and suicidal ideas. The patient is nervous/anxious.   All other systems reviewed and are  negative.     Objective:    Physical Exam Vitals reviewed.  Constitutional:      Appearance: Normal appearance.  HENT:     Head: Normocephalic.     Nose: Nose normal.  Eyes:     Conjunctiva/sclera: Conjunctivae normal.     Pupils: Pupils are equal, round, and reactive to light.  Cardiovascular:     Rate and Rhythm: Normal rate and regular rhythm.     Pulses: Normal pulses.     Heart sounds: Normal heart sounds.  Pulmonary:     Effort: Pulmonary effort is normal.     Breath sounds: Normal breath sounds.  Abdominal:     General: Bowel sounds are normal.  Musculoskeletal:        General: Normal range of motion.  Skin:    General: Skin is  warm.  Neurological:     Mental Status: She is alert and oriented to person, place, and time.  Psychiatric:     Comments: Anxiety     BP 134/81   Pulse 70   Temp 97.6 F (36.4 C)   Ht 5\' 4"  (1.626 m)   Wt 145 lb 3.2 oz (65.9 kg)   SpO2 97%   BMI 24.92 kg/m  Wt Readings from Last 3 Encounters:  03/09/20 145 lb 3.2 oz (65.9 kg)  12/05/19 139 lb (63 kg)  10/22/19 137 lb (62.1 kg)     Health Maintenance Due  Topic Date Due  . COVID-19 Vaccine (1) Never done  . HIV Screening  Never done  . TETANUS/TDAP  Never done    There are no preventive care reminders to display for this patient.  Lab Results  Component Value Date   TSH 1.570 05/10/2019   Lab Results  Component Value Date   WBC 8.1 12/05/2019   HGB 12.7 12/05/2019   HCT 36.8 12/05/2019   MCV 86 12/05/2019   PLT 376 12/05/2019   Lab Results  Component Value Date   NA 143 12/05/2019   K 3.1 (L) 12/05/2019   CO2 28 12/05/2019   GLUCOSE 98 12/05/2019   BUN 19 12/05/2019   CREATININE 0.85 12/05/2019   BILITOT 0.3 12/05/2019   ALKPHOS 152 (H) 12/05/2019   AST 45 (H) 12/05/2019   ALT 39 (H) 12/05/2019   PROT 7.0 12/05/2019   ALBUMIN 4.4 12/05/2019   CALCIUM 9.9 12/05/2019   ANIONGAP 10 10/22/2019   Lab Results  Component Value Date   CHOL 233 (H)  12/05/2019   Lab Results  Component Value Date   HDL 58 12/05/2019   Lab Results  Component Value Date   LDLCALC 121 (H) 12/05/2019   Lab Results  Component Value Date   TRIG 310 (H) 12/05/2019   Lab Results  Component Value Date   CHOLHDL 4.0 12/05/2019   Lab Results  Component Value Date   HGBA1C 5.9 (H) 02/17/2015      Assessment & Plan:   Problem List Items Addressed This Visit      Cardiovascular and Mediastinum   Hypertension - Primary    Hypertension symptoms well controlled on Cozaar 100 mg tablet daily by mouth. Continue low-sodium diet and exercise as tolerated.  Follow-up in 3 months        Other   Anxiety    Anxiety symptoms controlled on 0.5 mg Xanax.  Refill sent to pharmacy.  Follow-up with uncontrolled or worsening symptoms.      Relevant Medications   ALPRAZolam (XANAX) 0.5 MG tablet      Meds ordered this encounter  Medications  . ALPRAZolam (XANAX) 0.5 MG tablet    Sig: Take 1 tablet (0.5 mg total) by mouth 2 (two) times daily as needed for anxiety.    Dispense:  60 tablet    Refill:  0    Cancel refills of ativan    Order Specific Question:   Supervising Provider    Answer:   Janora Norlander [[id number]]    Follow-up: Return in about 3 months (around 06/06/2020).    Ivy Lynn, NP

## 2020-04-03 ENCOUNTER — Other Ambulatory Visit: Payer: Self-pay | Admitting: Nurse Practitioner

## 2020-04-03 DIAGNOSIS — I10 Essential (primary) hypertension: Secondary | ICD-10-CM

## 2020-04-09 ENCOUNTER — Telehealth: Payer: Self-pay

## 2020-05-25 ENCOUNTER — Other Ambulatory Visit: Payer: Self-pay

## 2020-05-25 ENCOUNTER — Other Ambulatory Visit: Payer: Self-pay | Admitting: Nurse Practitioner

## 2020-05-25 ENCOUNTER — Ambulatory Visit: Payer: 59 | Admitting: Nurse Practitioner

## 2020-05-25 ENCOUNTER — Encounter: Payer: Self-pay | Admitting: Nurse Practitioner

## 2020-05-25 VITALS — BP 125/80 | HR 62 | Temp 96.9°F | Ht 64.0 in | Wt 142.0 lb

## 2020-05-25 DIAGNOSIS — Z79899 Other long term (current) drug therapy: Secondary | ICD-10-CM | POA: Diagnosis not present

## 2020-05-25 DIAGNOSIS — F419 Anxiety disorder, unspecified: Secondary | ICD-10-CM

## 2020-05-25 DIAGNOSIS — I10 Essential (primary) hypertension: Secondary | ICD-10-CM

## 2020-05-25 DIAGNOSIS — M5030 Other cervical disc degeneration, unspecified cervical region: Secondary | ICD-10-CM | POA: Diagnosis not present

## 2020-05-25 NOTE — Progress Notes (Signed)
Established Patient Office Visit  Subjective:  Patient ID: [name], female    DOB: 04-20-1960  Age: 60 y.o. MRN: [id number]  CC:  Chief Complaint  Patient presents with  . Medical Management of Chronic Issues  . Pain Management    HPI [name] presents for Pt presents for follow up of hypertension. Patient was diagnosed in few years ago. The patient is tolerating the medication well without side effects. Compliance with treatment has been good; including taking medication as directed , maintains a healthy diet and regular exercise regimen , and following up as directed.  Current medication hydrochlorothiazide 12.5 mg by mouth daily.  Cardizem 180 mg capsule twice daily for high blood pressure.  Losartan 100 mg tablet by mouth daily.  Pain  She reports chronic back pain. was not an injury that may have caused the pain. The pain started a few years ago and is staying constant. The pain does not radiate .The pain is described as aching and soreness, is severe in intensity, occurring intermittently. Symptoms are worse in the: morning  Aggravating factors: bending backwards, bending forwards, bending sideways and walking Relieving factors: none.  She has tried prescription pain relievers with significant relief.   Anxiety: Patient complains of anxiety disorder.  She has the following symptoms: difficulty concentrating, irritable. Onset of symptoms was approximately a few years ago, gradually improving since that time. She denies current suicidal and homicidal ideation. Family history significant for no psychiatric illness.Possible organic causes contributing are: none. Risk factors: previous episode of depression Previous treatment includes Xanax .  She complains of the following side effects from the treatment: none.     Past Medical History:  Diagnosis Date  . Anxiety   . Arthritis   . Constipation 02/17/2015  . Emphysema lung ([place])   . Fibromyalgia   . GERD  (gastroesophageal reflux disease)   . Hypertension   . Lung cancer ([place]) 2012   pancoast tumor  . Menopausal vaginal dryness 02/17/2015    Past Surgical History:  Procedure Laterality Date  . CHOLECYSTECTOMY N/A 04/01/2016   Procedure: LAPAROSCOPIC CHOLECYSTECTOMY;  Surgeon: Aviva Signs, MD;  Location: [facility];  Service: General;  Laterality: N/A;  . COLONOSCOPY  11/2010   normal colonoscopy from anal verge to ileocecal valve; no polyps, masses, AVMs, diverticula, or evidence of inflammation  . ESOPHAGOGASTRODUODENOSCOPY N/A 10/16/2013   OIZ:[id number] ring s/p dilation/2 cm HH. multiple 3 mm round and shallow erosions. Negative H pylori  . 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 Hiatal hernia  . LAPAROSCOPIC APPENDECTOMY N/A 06/21/2013   Procedure: APPENDECTOMY LAPAROSCOPIC;  Surgeon: Jamesetta So, MD;  Location: [facility];  Service: General;  Laterality: N/A;  Venia Minks DILATION N/A 10/16/2013   Procedure: Keturah Shavers;  Surgeon: Daneil Dolin, MD;  Location: [facility];  Service: Endoscopy;  Laterality: N/A;  . PORTACATH PLACEMENT  [id number]   Rt   IJ  Port-A-Cath  [email or web address]  . STERNAL WIRES REMOVAL  01/23/2012   Procedure: STERNAL WIRES REMOVAL;  Surgeon: Grace Isaac, MD;  Location: [place];  Service: Thoracic;  Laterality: N/A;  . THORACOTOMY  [id number]   transsmanubrial anterolaterl thoracotomy with chest wall resection includinf the first rib and left  upper lobectomy for Pancoast turmor  . TUBAL LIGATION      Family History  Problem Relation Age of Onset  . Cancer Mother        lung  . Hypertension Father   .  Stroke Father   . Hypertension Sister   . Heart disease Sister   . Cancer Brother 50       pancreatic  . Colon cancer Neg Hx     Social History   Socioeconomic History  . Marital status: Married    Spouse name: Not on file  . Number of children: Not on file  . Years of education: Not on  file  . Highest education level: Not on file  Occupational History  . Not on file  Tobacco Use  . Smoking status: Former Smoker    Packs/day: 1.50    Years: 35.00    Pack years: 52.50    Types: Cigarettes    Quit date: 02/04/2010    Years since quitting: 10.3  . Smokeless tobacco: Never Used  . Tobacco comment: Quit x 8 years  Vaping Use  . Vaping Use: Never used  Substance and Sexual Activity  . Alcohol use: Yes    Alcohol/week: 0.0 standard drinks    Comment: occasionally   . Drug use: No  . Sexual activity: Yes    Birth control/protection: Post-menopausal  Other Topics Concern  . Not on file  Social History Narrative  . Not on file   Social Determinants of Health   Financial Resource Strain: Not on file  Food Insecurity: Not on file  Transportation Needs: Not on file  Physical Activity: Not on file  Stress: Not on file  Social Connections: Not on file  Intimate Partner Violence: Not on file    Outpatient Medications Prior to Visit  Medication Sig Dispense Refill  . ALPRAZolam (XANAX) 0.5 MG tablet Take 1 tablet (0.5 mg total) by mouth 2 (two) times daily as needed for anxiety. 60 tablet 0  . diltiazem (CARDIZEM CD) 180 MG 24 hr capsule TAKE (1) CAPSULE TWICE DAILY FOR HIGH BLOOD PRESSURE 60 capsule 0  . escitalopram (LEXAPRO) 10 MG tablet Take 1 tablet (10 mg total) by mouth daily. 30 tablet 4  . hydrochlorothiazide (MICROZIDE) 12.5 MG capsule Take 1 capsule (12.5 mg total) by mouth daily. 90 capsule 1  . HYDROcodone-acetaminophen (NORCO) 10-325 MG tablet Take 2 tablets by mouth daily as needed. 40 tablet 0  . losartan (COZAAR) 100 MG tablet Take 1 tablet (100 mg total) by mouth daily. 90 tablet 3  . PROAIR HFA 108 (90 Base) MCG/ACT inhaler 2 PUFFS EVERY 6 HOURS AS NEEDED FOR WHEEZING OR SHORTNESS OF BREATH 8.5 g 1  . budesonide-formoterol (SYMBICORT) 80-4.5 MCG/ACT inhaler Inhale 2 puffs into the lungs 2 (two) times daily. (Patient not taking: Reported on 05/25/2020)  3 each 1  . LORazepam (ATIVAN) 1 MG tablet Take 1 tablet (1 mg total) by mouth at bedtime. 30 tablet 5   No facility-administered medications prior to visit.    Allergies  Allergen Reactions  . Lisinopril Swelling    Of lips  . Nitrofuran Derivatives     ROS Review of Systems  Constitutional: Negative.   HENT: Negative.   Respiratory: Negative.   Cardiovascular: Negative.   Genitourinary: Negative.   Musculoskeletal: Positive for back pain.  Skin: Negative for rash.  Psychiatric/Behavioral: The patient is nervous/anxious.   All other systems reviewed and are negative.     Objective:    Physical Exam Vitals and nursing note reviewed.  Constitutional:      Appearance: Normal appearance.  HENT:     Head: Normocephalic.     Nose: Nose normal.  Eyes:     Conjunctiva/sclera: Conjunctivae normal.  Cardiovascular:     Rate and Rhythm: Normal rate and regular rhythm.     Pulses: Normal pulses.     Heart sounds: Normal heart sounds.  Pulmonary:     Effort: Pulmonary effort is normal.     Breath sounds: Normal breath sounds.  Abdominal:     General: Bowel sounds are normal.  Musculoskeletal:     Lumbar back: Tenderness present. Decreased range of motion.       Back:     Comments: Chronic lower back pain . Hx DDD   Neurological:     Mental Status: She is alert and oriented to person, place, and time.  Psychiatric:        Attention and Perception: Attention and perception normal.        Mood and Affect: Mood is anxious.        Speech: Speech normal.        Behavior: Behavior is cooperative.     BP 125/80   Pulse 62   Temp (!) 96.9 F (36.1 C) (Temporal)   Ht 5\' 4"  (1.626 m)   Wt 142 lb (64.4 kg)   SpO2 96%   BMI 24.37 kg/m  Wt Readings from Last 3 Encounters:  05/25/20 142 lb (64.4 kg)  03/09/20 145 lb 3.2 oz (65.9 kg)  12/05/19 139 lb (63 kg)     Health Maintenance Due  Topic Date Due  . COVID-19 Vaccine (1) Never done  . HIV Screening  Never done   . TETANUS/TDAP  Never done    There are no preventive care reminders to display for this patient.  Lab Results  Component Value Date   TSH 1.570 05/10/2019   Lab Results  Component Value Date   WBC 8.1 12/05/2019   HGB 12.7 12/05/2019   HCT 36.8 12/05/2019   MCV 86 12/05/2019   PLT 376 12/05/2019   Lab Results  Component Value Date   NA 143 12/05/2019   K 3.1 (L) 12/05/2019   CO2 28 12/05/2019   GLUCOSE 98 12/05/2019   BUN 19 12/05/2019   CREATININE 0.85 12/05/2019   BILITOT 0.3 12/05/2019   ALKPHOS 152 (H) 12/05/2019   AST 45 (H) 12/05/2019   ALT 39 (H) 12/05/2019   PROT 7.0 12/05/2019   ALBUMIN 4.4 12/05/2019   CALCIUM 9.9 12/05/2019   ANIONGAP 10 10/22/2019   Lab Results  Component Value Date   CHOL 233 (H) 12/05/2019   Lab Results  Component Value Date   HDL 58 12/05/2019   Lab Results  Component Value Date   LDLCALC 121 (H) 12/05/2019   Lab Results  Component Value Date   TRIG 310 (H) 12/05/2019   Lab Results  Component Value Date   CHOLHDL 4.0 12/05/2019   Lab Results  Component Value Date   HGBA1C 5.9 (H) 02/17/2015      Assessment & Plan:   Problem List Items Addressed This Visit      Cardiovascular and Mediastinum   Hypertension - Primary    Hypertension well controlled on Cardizem 180 mg tablet by mouth twice daily, Cozaar 100 mg tablet by mouth daily, hydrochlorothiazide 12.5 mg tablet by mouth daily.      Relevant Orders   CBC with Differential   Comprehensive metabolic panel   Lipid Panel     Musculoskeletal and Integument   DDD (degenerative disc disease), cervical    Pain well controlled on Norco 10-325 mg tablet by mouth daily as needed.  Other   Anxiety    Anxiety well controlled on Xanax 0.5 mg tablet by mouth 2 times daily as needed      Controlled substance agreement signed    Controlled substance agreement signed.  UDS completed results pending.      Relevant Orders   ToxASSURE Select 13 (MW), Urine       No orders of the defined types were placed in this encounter.   Follow-up: Return in about 3 months (around 08/25/2020).    Ivy Lynn, NP

## 2020-05-25 NOTE — Assessment & Plan Note (Signed)
Hypertension well controlled on Cardizem 180 mg tablet by mouth twice daily, Cozaar 100 mg tablet by mouth daily, hydrochlorothiazide 12.5 mg tablet by mouth daily.

## 2020-05-25 NOTE — Patient Instructions (Signed)
http://NIMH.NIH.Gov">  Generalized Anxiety Disorder, Adult Generalized anxiety disorder (GAD) is a mental health condition. Unlike normal worries, anxiety related to GAD is not triggered by a specific event. These worries do not fade or get better with time. GAD interferes with relationships, work, and school. GAD symptoms can vary from mild to severe. People with severe GAD can have intense waves of anxiety with physical symptoms that are similar to panic attacks. What are the causes? The exact cause of GAD is not known, but the following are believed to have an impact:  Differences in natural brain chemicals.  Genes passed down from parents to children.  Differences in the way threats are perceived.  Development during childhood.  Personality. What increases the risk? The following factors may make you more likely to develop this condition:  Being female.  Having a family history of anxiety disorders.  Being very shy.  Experiencing very stressful life events, such as the death of a loved one.  Having a very stressful family environment. What are the signs or symptoms? People with GAD often worry excessively about many things in their lives, such as their health and family. Symptoms may also include:  Mental and emotional symptoms: ? Worrying excessively about natural disasters. ? Fear of being late. ? Difficulty concentrating. ? Fears that others are judging your performance.  Physical symptoms: ? Fatigue. ? Headaches, muscle tension, muscle twitches, trembling, or feeling shaky. ? Feeling like your heart is pounding or beating very fast. ? Feeling out of breath or like you cannot take a deep breath. ? Having trouble falling asleep or staying asleep, or experiencing restlessness. ? Sweating. ? Nausea, diarrhea, or irritable bowel syndrome (IBS).  Behavioral symptoms: ? Experiencing erratic moods or irritability. ? Avoidance of new situations. ? Avoidance of  people. ? Extreme difficulty making decisions. How is this diagnosed? This condition is diagnosed based on your symptoms and medical history. You will also have a physical exam. Your health care provider may perform tests to rule out other possible causes of your symptoms. To be diagnosed with GAD, a person must have anxiety that:  Is out of his or her control.  Affects several different aspects of his or her life, such as work and relationships.  Causes distress that makes him or her unable to take part in normal activities.  Includes at least three symptoms of GAD, such as restlessness, fatigue, trouble concentrating, irritability, muscle tension, or sleep problems. Before your health care provider can confirm a diagnosis of GAD, these symptoms must be present more days than they are not, and they must last for 6 months or longer. How is this treated? This condition may be treated with:  Medicine. Antidepressant medicine is usually prescribed for long-term daily control. Anti-anxiety medicines may be added in severe cases, especially when panic attacks occur.  Talk therapy (psychotherapy). Certain types of talk therapy can be helpful in treating GAD by providing support, education, and guidance. Options include: ? Cognitive behavioral therapy (CBT). People learn coping skills and self-calming techniques to ease their physical symptoms. They learn to identify unrealistic thoughts and behaviors and to replace them with more appropriate thoughts and behaviors. ? Acceptance and commitment therapy (ACT). This treatment teaches people how to be mindful as a way to cope with unwanted thoughts and feelings. ? Biofeedback. This process trains you to manage your body's response (physiological response) through breathing techniques and relaxation methods. You will work with a therapist while machines are used to monitor  your physical symptoms.  Stress management techniques. These include yoga,  meditation, and exercise. A mental health specialist can help determine which treatment is best for you. Some people see improvement with one type of therapy. However, other people require a combination of therapies.   Follow these instructions at home: Lifestyle  Maintain a consistent routine and schedule.  Anticipate stressful situations. Create a plan, and allow extra time to work with your plan.  Practice stress management or self-calming techniques that you have learned from your therapist or your health care provider. General instructions  Take over-the-counter and prescription medicines only as told by your health care provider.  Understand that you are likely to have setbacks. Accept this and be kind to yourself as you persist to take better care of yourself.  Recognize and accept your accomplishments, even if you judge them as small.  Keep all follow-up visits as told by your health care provider. This is important. Contact a health care provider if:  Your symptoms do not get better.  Your symptoms get worse.  You have signs of depression, such as: ? A persistently sad or irritable mood. ? Loss of enjoyment in activities that used to bring you joy. ? Change in weight or eating. ? Changes in sleeping habits. ? Avoiding friends or family members. ? Loss of energy for normal tasks. ? Feelings of guilt or worthlessness. Get help right away if:  You have serious thoughts about hurting yourself or others. If you ever feel like you may hurt yourself or others, or have thoughts about taking your own life, get help right away. Go to your nearest emergency department or:  Call your local emergency services (911 in the [place]  Call a suicide crisis helpline, such as the [place] at 403-769-6072. This is open 24 hours a day in the [place]  Text the Crisis Text Line at 704 228 7222 (in the [place] Summary  Generalized anxiety disorder (GAD) is a mental  health condition that involves worry that is not triggered by a specific event.  People with GAD often worry excessively about many things in their lives, such as their health and family.  GAD may cause symptoms such as restlessness, trouble concentrating, sleep problems, frequent sweating, nausea, diarrhea, headaches, and trembling or muscle twitching.  A mental health specialist can help determine which treatment is best for you. Some people see improvement with one type of therapy. However, other people require a combination of therapies. This information is not intended to replace advice given to you by your health care provider. Make sure you discuss any questions you have with your health care provider. Document Revised: 10/31/2018 Document Reviewed: 10/31/2018 Elsevier Patient Education  2021 [place]. Hypertension, Adult Hypertension is another name for high blood pressure. High blood pressure forces your heart to work harder to pump blood. This can cause problems over time. There are two numbers in a blood pressure reading. There is a top number (systolic) over a bottom number (diastolic). It is best to have a blood pressure that is below 120/80. Healthy choices can help lower your blood pressure, or you may need medicine to help lower it. What are the causes? The cause of this condition is not known. Some conditions may be related to high blood pressure. What increases the risk?  Smoking.  Having type 2 diabetes mellitus, high cholesterol, or both.  Not getting enough exercise or physical activity.  Being overweight.  Having too much fat, sugar, calories, or salt (  sodium) in your diet.  Drinking too much alcohol.  Having long-term (chronic) kidney disease.  Having a family history of high blood pressure.  Age. Risk increases with age.  Race. You may be at higher risk if you are African American.  Gender. Men are at higher risk than women before age 51. After age  78, women are at higher risk than men.  Having obstructive sleep apnea.  Stress. What are the signs or symptoms?  High blood pressure may not cause symptoms. Very high blood pressure (hypertensive crisis) may cause: ? Headache. ? Feelings of worry or nervousness (anxiety). ? Shortness of breath. ? Nosebleed. ? A feeling of being sick to your stomach (nausea). ? Throwing up (vomiting). ? Changes in how you see. ? Very bad chest pain. ? Seizures. How is this treated?  This condition is treated by making healthy lifestyle changes, such as: ? Eating healthy foods. ? Exercising more. ? Drinking less alcohol.  Your health care provider may prescribe medicine if lifestyle changes are not enough to get your blood pressure under control, and if: ? Your top number is above 130. ? Your bottom number is above 80.  Your personal target blood pressure may vary. Follow these instructions at home: Eating and drinking  If told, follow the DASH eating plan. To follow this plan: ? Fill one half of your plate at each meal with fruits and vegetables. ? Fill one fourth of your plate at each meal with whole grains. Whole grains include whole-wheat pasta, brown rice, and whole-grain bread. ? Eat or drink low-fat dairy products, such as skim milk or low-fat yogurt. ? Fill one fourth of your plate at each meal with low-fat (lean) proteins. Low-fat proteins include fish, chicken without skin, eggs, beans, and tofu. ? Avoid fatty meat, cured and processed meat, or chicken with skin. ? Avoid pre-made or processed food.  Eat less than 1,500 mg of salt each day.  Do not drink alcohol if: ? Your doctor tells you not to drink. ? You are pregnant, may be pregnant, or are planning to become pregnant.  If you drink alcohol: ? Limit how much you use to:  0-1 drink a day for women.  0-2 drinks a day for men. ? Be aware of how much alcohol is in your drink. In the [place] one drink equals one 12 oz bottle  of beer (355 mL), one 5 oz glass of wine (148 mL), or one 1 oz glass of hard liquor (44 mL).   Lifestyle  Work with your doctor to stay at a healthy weight or to lose weight. Ask your doctor what the best weight is for you.  Get at least 30 minutes of exercise most days of the week. This may include walking, swimming, or biking.  Get at least 30 minutes of exercise that strengthens your muscles (resistance exercise) at least 3 days a week. This may include lifting weights or doing Pilates.  Do not use any products that contain nicotine or tobacco, such as cigarettes, e-cigarettes, and chewing tobacco. If you need help quitting, ask your doctor.  Check your blood pressure at home as told by your doctor.  Keep all follow-up visits as told by your doctor. This is important.   Medicines  Take over-the-counter and prescription medicines only as told by your doctor. Follow directions carefully.  Do not skip doses of blood pressure medicine. The medicine does not work as well if you skip doses. Skipping doses also puts you  at risk for problems.  Ask your doctor about side effects or reactions to medicines that you should watch for. Contact a doctor if you:  Think you are having a reaction to the medicine you are taking.  Have headaches that keep coming back (recurring).  Feel dizzy.  Have swelling in your ankles.  Have trouble with your vision. Get help right away if you:  Get a very bad headache.  Start to feel mixed up (confused).  Feel weak or numb.  Feel faint.  Have very bad pain in your: ? Chest. ? Belly (abdomen).  Throw up more than once.  Have trouble breathing. Summary  Hypertension is another name for high blood pressure.  High blood pressure forces your heart to work harder to pump blood.  For most people, a normal blood pressure is less than 120/80.  Making healthy choices can help lower blood pressure. If your blood pressure does not get lower with  healthy choices, you may need to take medicine. This information is not intended to replace advice given to you by your health care provider. Make sure you discuss any questions you have with your health care provider. Document Revised: 09/20/2017 Document Reviewed: 09/20/2017 Elsevier Patient Education  2021 [employer].

## 2020-05-25 NOTE — Assessment & Plan Note (Signed)
Pain well controlled on Norco 10-325 mg tablet by mouth daily as needed.

## 2020-05-25 NOTE — Assessment & Plan Note (Signed)
Controlled substance agreement signed.  UDS completed results pending.

## 2020-05-25 NOTE — Assessment & Plan Note (Signed)
Anxiety well controlled on Xanax 0.5 mg tablet by mouth 2 times daily as needed

## 2020-05-26 LAB — COMPREHENSIVE METABOLIC PANEL
ALT: 43 IU/L — ABNORMAL HIGH (ref 0–32)
AST: 31 IU/L (ref 0–40)
Albumin/Globulin Ratio: 1.5 (ref 1.2–2.2)
Albumin: 4.3 g/dL (ref 3.8–4.9)
Alkaline Phosphatase: 183 IU/L — ABNORMAL HIGH (ref 44–121)
BUN/Creatinine Ratio: 31 — ABNORMAL HIGH (ref 12–28)
BUN: 27 mg/dL (ref 8–27)
Bilirubin Total: 0.3 mg/dL (ref 0.0–1.2)
CO2: 22 mmol/L (ref 20–29)
Calcium: 9.4 mg/dL (ref 8.7–10.3)
Chloride: 102 mmol/L (ref 96–106)
Creatinine, Ser: 0.86 mg/dL (ref 0.57–1.00)
Globulin, Total: 2.8 g/dL (ref 1.5–4.5)
Glucose: 91 mg/dL (ref 65–99)
Potassium: 4.1 mmol/L (ref 3.5–5.2)
Sodium: 139 mmol/L (ref 134–144)
Total Protein: 7.1 g/dL (ref 6.0–8.5)
eGFR: 77 mL/min/{1.73_m2} (ref >59)

## 2020-05-26 LAB — LIPID PANEL
Chol/HDL Ratio: 3.2 ratio (ref 0.0–4.4)
Cholesterol, Total: 208 mg/dL — ABNORMAL HIGH (ref 100–199)
HDL: 65 mg/dL (ref >39)
LDL Chol Calc (NIH): 115 mg/dL — ABNORMAL HIGH (ref 0–99)
Triglycerides: 163 mg/dL — ABNORMAL HIGH (ref 0–149)
VLDL Cholesterol Cal: 28 mg/dL (ref 5–40)

## 2020-05-26 LAB — CBC WITH DIFFERENTIAL/PLATELET
Basophils Absolute: 0.1 10*3/uL (ref 0.0–0.2)
Basos: 1 %
EOS (ABSOLUTE): 0.5 10*3/uL — ABNORMAL HIGH (ref 0.0–0.4)
Eos: 6 %
Hematocrit: 37.1 % (ref 34.0–46.6)
Hemoglobin: 12.5 g/dL (ref 11.1–15.9)
Immature Grans (Abs): 0 10*3/uL (ref 0.0–0.1)
Immature Granulocytes: 0 %
Lymphocytes Absolute: 2.3 10*3/uL (ref 0.7–3.1)
Lymphs: 29 %
MCH: 29.3 pg (ref 26.6–33.0)
MCHC: 33.7 g/dL (ref 31.5–35.7)
MCV: 87 fL (ref 79–97)
Monocytes Absolute: 0.6 10*3/uL (ref 0.1–0.9)
Monocytes: 7 %
Neutrophils Absolute: 4.6 10*3/uL (ref 1.4–7.0)
Neutrophils: 57 %
Platelets: 333 10*3/uL (ref 150–450)
RBC: 4.27 x10E6/uL (ref 3.77–5.28)
RDW: 12.2 % (ref 11.7–15.4)
WBC: 8 10*3/uL (ref 3.4–10.8)

## 2020-06-02 ENCOUNTER — Other Ambulatory Visit: Payer: Self-pay | Admitting: Family Medicine

## 2020-06-02 ENCOUNTER — Other Ambulatory Visit: Payer: Self-pay | Admitting: Nurse Practitioner

## 2020-06-02 DIAGNOSIS — I10 Essential (primary) hypertension: Secondary | ICD-10-CM

## 2020-06-02 DIAGNOSIS — F339 Major depressive disorder, recurrent, unspecified: Secondary | ICD-10-CM

## 2020-06-03 ENCOUNTER — Other Ambulatory Visit: Payer: Self-pay | Admitting: Nurse Practitioner

## 2020-06-03 DIAGNOSIS — F419 Anxiety disorder, unspecified: Secondary | ICD-10-CM

## 2020-06-03 LAB — TOXASSURE SELECT 13 (MW), URINE

## 2020-06-03 MED ORDER — ALPRAZOLAM 0.5 MG PO TABS: 0.5000 mg | ORAL_TABLET | Freq: Two times a day (BID) | ORAL | 0 refills | Status: DC | PRN

## 2020-06-05 ENCOUNTER — Telehealth: Payer: Self-pay

## 2020-06-05 NOTE — Telephone Encounter (Signed)
Pt called stating that she has been patiently waiting for over a week to get refills for her Hydrocodone Rx called in to [facility].   Per lab results from 05/25/20, Je approved to send Rx's to the pharmacy but didn't.  Pt need refills sent in today. Please advise and call pt to confirm that refills were sent.

## 2020-06-05 NOTE — Telephone Encounter (Signed)
I called pt - she is upset that she has waited so long for this med (hydrocodone)  She said she was called and made aware of the drug screen and that meds would be sent in - and both were not sent.   She did get the xanax Hydrocodone refill she is asking for was #40 (she only ask for #80 a year)   She would like this done ASAP to [facility]    She has been completely out for a week now and her back is hurting a lot. She is having to supplement with a lot of IBU    Please advise ASAP  Aware provider is off today and it may be Monday before we get an answer

## 2020-06-05 NOTE — Telephone Encounter (Signed)
This is DUP encounter - 2 were opened within 20 min time.

## 2020-06-07 ENCOUNTER — Other Ambulatory Visit: Payer: Self-pay | Admitting: Nurse Practitioner

## 2020-06-07 MED ORDER — HYDROCODONE-ACETAMINOPHEN 10-325 MG PO TABS: 1.0000 | ORAL_TABLET | Freq: Two times a day (BID) | ORAL | 0 refills | Status: DC

## 2020-06-08 NOTE — Telephone Encounter (Signed)
Pt called - LM - pain med at pharm as of 5/15.

## 2020-06-09 ENCOUNTER — Ambulatory Visit: Payer: 59 | Admitting: Nurse Practitioner

## 2020-06-29 ENCOUNTER — Telehealth: Payer: Self-pay | Admitting: Acute Care

## 2020-06-29 NOTE — Telephone Encounter (Signed)
I called and spoke with patient in regards to shoulder pain. She has never been established with the office but came for low dose CT with sarah. She stated she has been experiencing more shoulder pain on the side her upper lung was removed and wonder who should she call. I informed her to call her PCP or reach out to orthopedic doc for recs. Patient verbalized understanding, nothing further needed.

## 2020-07-24 ENCOUNTER — Other Ambulatory Visit: Payer: Self-pay

## 2020-07-24 ENCOUNTER — Ambulatory Visit: Payer: 59 | Admitting: Nurse Practitioner

## 2020-07-24 ENCOUNTER — Encounter: Payer: Self-pay | Admitting: Nurse Practitioner

## 2020-07-24 VITALS — BP 152/92 | HR 75 | Temp 98.0°F | Ht 64.0 in | Wt 138.0 lb

## 2020-07-24 DIAGNOSIS — F419 Anxiety disorder, unspecified: Secondary | ICD-10-CM

## 2020-07-24 DIAGNOSIS — F339 Major depressive disorder, recurrent, unspecified: Secondary | ICD-10-CM

## 2020-07-24 DIAGNOSIS — I10 Essential (primary) hypertension: Secondary | ICD-10-CM | POA: Diagnosis not present

## 2020-07-24 DIAGNOSIS — Z23 Encounter for immunization: Secondary | ICD-10-CM | POA: Diagnosis not present

## 2020-07-24 HISTORY — DX: Major depressive disorder, recurrent, unspecified: F33.9

## 2020-07-24 MED ORDER — ESCITALOPRAM OXALATE 20 MG PO TABS: 20.0000 mg | ORAL_TABLET | Freq: Every day | ORAL | 5 refills | Status: DC

## 2020-07-24 MED ORDER — HYDROCHLOROTHIAZIDE 25 MG PO TABS: 25.0000 mg | ORAL_TABLET | Freq: Every day | ORAL | 3 refills | Status: DC

## 2020-07-24 NOTE — Patient Instructions (Signed)
Hypertension, Adult Hypertension is another name for high blood pressure. High blood pressure forces your heart to work harder to pump blood. This can cause problems overtime. There are two numbers in a blood pressure reading. There is a top number (systolic) over a bottom number (diastolic). It is best to have a blood pressure that is below 120/80. Healthy choicescan help lower your blood pressure, or you may need medicine to help lower it. What are the causes? The cause of this condition is not known. Some conditions may be related tohigh blood pressure. What increases the risk? Smoking. Having type 2 diabetes mellitus, high cholesterol, or both. Not getting enough exercise or physical activity. Being overweight. Having too much fat, sugar, calories, or salt (sodium) in your diet. Drinking too much alcohol. Having long-term (chronic) kidney disease. Having a family history of high blood pressure. Age. Risk increases with age. Race. You may be at higher risk if you are African American. Gender. Men are at higher risk than women before age 74. After age 49, women are at higher risk than men. Having obstructive sleep apnea. Stress. What are the signs or symptoms? High blood pressure may not cause symptoms. Very high blood pressure (hypertensive crisis) may cause: Headache. Feelings of worry or nervousness (anxiety). Shortness of breath. Nosebleed. A feeling of being sick to your stomach (nausea). Throwing up (vomiting). Changes in how you see. Very bad chest pain. Seizures. How is this treated? This condition is treated by making healthy lifestyle changes, such as: Eating healthy foods. Exercising more. Drinking less alcohol. Your health care provider may prescribe medicine if lifestyle changes are not enough to get your blood pressure under control, and if: Your top number is above 130. Your bottom number is above 80. Your personal target blood pressure may vary. Follow these  instructions at home: Eating and drinking  If told, follow the DASH eating plan. To follow this plan: Fill one half of your plate at each meal with fruits and vegetables. Fill one fourth of your plate at each meal with whole grains. Whole grains include whole-wheat pasta, brown rice, and whole-grain bread. Eat or drink low-fat dairy products, such as skim milk or low-fat yogurt. Fill one fourth of your plate at each meal with low-fat (lean) proteins. Low-fat proteins include fish, chicken without skin, eggs, beans, and tofu. Avoid fatty meat, cured and processed meat, or chicken with skin. Avoid pre-made or processed food. Eat less than 1,500 mg of salt each day. Do not drink alcohol if: Your doctor tells you not to drink. You are pregnant, may be pregnant, or are planning to become pregnant. If you drink alcohol: Limit how much you use to: 0-1 drink a day for women. 0-2 drinks a day for men. Be aware of how much alcohol is in your drink. In the [place] one drink equals one 12 oz bottle of beer (355 mL), one 5 oz glass of wine (148 mL), or one 1 oz glass of hard liquor (44 mL).  Lifestyle  Work with your doctor to stay at a healthy weight or to lose weight. Ask your doctor what the best weight is for you. Get at least 30 minutes of exercise most days of the week. This may include walking, swimming, or biking. Get at least 30 minutes of exercise that strengthens your muscles (resistance exercise) at least 3 days a week. This may include lifting weights or doing Pilates. Do not use any products that contain nicotine or tobacco, such as cigarettes,  e-cigarettes, and chewing tobacco. If you need help quitting, ask your doctor. Check your blood pressure at home as told by your doctor. Keep all follow-up visits as told by your doctor. This is important.  Medicines Take over-the-counter and prescription medicines only as told by your doctor. Follow directions carefully. Do not skip doses of  blood pressure medicine. The medicine does not work as well if you skip doses. Skipping doses also puts you at risk for problems. Ask your doctor about side effects or reactions to medicines that you should watch for. Contact a doctor if you: Think you are having a reaction to the medicine you are taking. Have headaches that keep coming back (recurring). Feel dizzy. Have swelling in your ankles. Have trouble with your vision. Get help right away if you: Get a very bad headache. Start to feel mixed up (confused). Feel weak or numb. Feel faint. Have very bad pain in your: Chest. Belly (abdomen). Throw up more than once. Have trouble breathing. Summary Hypertension is another name for high blood pressure. High blood pressure forces your heart to work harder to pump blood. For most people, a normal blood pressure is less than 120/80. Making healthy choices can help lower blood pressure. If your blood pressure does not get lower with healthy choices, you may need to take medicine. This information is not intended to replace advice given to you by your health care provider. Make sure you discuss any questions you have with your healthcare provider. Document Revised: 09/20/2017 Document Reviewed: 09/20/2017 Elsevier Patient Education  [place]. http://APA.org/depression-guideline"> https://clinicalkey.com"> http://point-of-care.elsevierperformancemanager.com/skills/"> http://point-of-care.elsevierperformancemanager.com">  Managing Depression, Adult Depression is a mental health condition that affects your thoughts, feelings, and actions. Being diagnosed with depression can bring you relief if you did not know why you have felt or behaved a certain way. It could also leave you feeling overwhelmed with uncertainty about your future. Preparing yourself tomanage your symptoms can help you feel more positive about your future. How to manage lifestyle changes Managing stress  Stress is  your body's reaction to life changes and events, both good and bad. Stress can add to your feelings of depression. Learning to manage your stresscan help lessen your feelings of depression. Try some of the following approaches to reducing your stress (stress reduction techniques): Listen to music that you enjoy and that inspires you. Try using a meditation app or take a meditation class. Develop a practice that helps you connect with your spiritual self. Walk in nature, pray, or go to a place of worship. Do some deep breathing. To do this, inhale slowly through your nose. Pause at the top of your inhale for a few seconds and then exhale slowly, letting your muscles relax. Practice yoga to help relax and work your muscles. Choose a stress reduction technique that suits your lifestyle and personality. These techniques take time and practice to develop. Set aside 5-15 minutes a day to do them. Therapists can offer training in these techniques. Other things you can do to manage stress include: Keeping a stress diary. Knowing your limits and saying no when you think something is too much. Paying attention to how you react to certain situations. You may not be able to control everything, but you can change your reaction. Adding humor to your life by watching funny films or TV shows. Making time for activities that you enjoy and that relax you.  Medicines Medicines, such as antidepressants, are often a part of treatment for depression. Talk with your pharmacist  or health care provider about all the medicines, supplements, and herbal products that you take, their possible side effects, and what medicines and other products are safe to take together. Make sure to report any side effects you may have to your health care provider. Relationships Your health care provider may suggest family therapy, couples therapy, orindividual therapy as part of your treatment. How to recognize changes Everyone responds  differently to treatment for depression. As you recover from depression, you may start to: Have more interest in doing activities. Feel less hopeless. Have more energy. Overeat less often, or have a better appetite. Have better mental focus. It is important to recognize if your depression is not getting better or is getting worse. The symptoms you had in the beginning may return, such as: Tiredness (fatigue) or low energy. Eating too much or too little. Sleeping too much or too little. Feeling restless, agitated, or hopeless. Trouble focusing or making decisions. Unexplained physical complaints. Feeling irritable, angry, or aggressive. If you or your family members notice these symptoms coming back, let yourhealth care provider know right away. Follow these instructions at home: Activity  Try to get some form of exercise each day, such as walking, biking, swimming, or lifting weights. Practice stress reduction techniques. Engage your mind by taking a class or doing some volunteer work.  Lifestyle Get the right amount and quality of sleep. Cut down on using caffeine, tobacco, alcohol, and other potentially harmful substances. Eat a healthy diet that includes plenty of vegetables, fruits, whole grains, low-fat dairy products, and lean protein. Do not eat a lot of foods that are high in solid fats, added sugars, or salt (sodium). General instructions Take over-the-counter and prescription medicines only as told by your health care provider. Keep all follow-up visits as told by your health care provider. This is important. Where to find support Talking to others  Friends and family members can be sources of support and guidance. Talk to trusted friends or family members about your condition. Explain your symptoms to them, and let them know that you are working with a health care provider to treat your depression. Tell friends and family members how they also can behelpful. Finances Find  appropriate mental health providers that fit with your financial situation. Talk with your health care provider about options to get reduced prices on your medicines. Where to find more information You can find support in your area from: Anxiety and Depression Association of America (ADAA): www.adaa.org Mental Health America: www.mentalhealthamerica.net [employer] on Mental Illness: www.nami.org Contact a health care provider if: You stop taking your antidepressant medicines, and you have any of these symptoms: Nausea. Headache. Light-headedness. Chills and body aches. Not being able to sleep (insomnia). You or your friends and family think your depression is getting worse. Get help right away if: You have thoughts of hurting yourself or others. If you ever feel like you may hurt yourself or others, or have thoughts about taking your own life, get help right away. Go to your nearest emergency department or: Call your local emergency services (911 in the [place] Call a suicide crisis helpline, such as the [place] at (276)407-1488. This is open 24 hours a day in the [place] Text the Crisis Text Line at (575) 160-6999 (in the [place].). Summary If you are diagnosed with depression, preparing yourself to manage your symptoms is a good way to feel positive about your future. Work with your health care provider on a management plan that includes  stress reduction techniques, medicines (if applicable), therapy, and healthy lifestyle habits. Keep talking with your health care provider about how your treatment is working. If you have thoughts about taking your own life, call a suicide crisis helpline or text a crisis text line. This information is not intended to replace advice given to you by your health care provider. Make sure you discuss any questions you have with your healthcare provider. Document Revised: 11/21/2018 Document Reviewed: 11/21/2018 Elsevier Patient  Education  2022 [employer].

## 2020-07-24 NOTE — Progress Notes (Signed)
Established Patient Office Visit  Subjective:  Patient ID: [name], female    DOB: 01/20/1961  Age: 60 y.o. MRN: [id number]  CC:  Chief Complaint  Patient presents with   Hypertension   Depression    HPI [name] presents for Pt presents for follow up of hypertension. Patient was diagnosed in _5/22/2022. The patient is tolerating the medication well without side effects. Compliance with treatment has been good; including taking medication as directed , maintains a healthy diet and regular exercise regimen , and following up as directed.  Current medication hydrochlorothiazide 12.5 mg tablet by mouth daily, losartan 100 mg tablet by mouth daily, Cardizem 180 mg capsule twice a day for blood pressure.  Depression, Follow-up  She  was last seen for this 6 weeks ago. Changes made at last visit include:continued Lexapro 10 Milligrams tablet by mouth daily.   She reports fair compliance with treatment. She she was having side effects.  Symptoms not well controlled.  She reports good tolerance of treatment. Current symptoms include: depressed mood and feelings of worthlessness/guilt She feels she is Worse since last visit.  Depression screen [name] 2/9 07/24/2020 03/09/2020 12/05/2019  Decreased Interest 2 0 0  Down, Depressed, Hopeless 3 0 0  PHQ - 2 Score 5 0 0  Altered sleeping 3 - 0  Tired, decreased energy 2 - 0  Change in appetite 2 - 0  Feeling bad or failure about yourself  2 - 0  Trouble concentrating 3 - 0  Moving slowly or fidgety/restless 1 - 0  Suicidal thoughts 0 - 0  PHQ-9 Score 18 - 0  Difficult doing work/chores Very difficult - -    GAD 7 : Generalized Anxiety Score 07/24/2020 03/09/2020 12/05/2019 05/10/2019  Nervous, Anxious, on Edge _0 Control/stop worrying 1 0 1 1  Worry too much - different things 0 0 0 0  Trouble relaxing _1 Restless 1 0 0 0  Easily annoyed or irritable _2 Afraid - awful might happen 0 0 0 0  Total GAD 7  Score _3 Anxiety Difficulty Very difficult Somewhat difficult Not difficult at all Not difficult at all      Past Medical History:  Diagnosis Date   Anxiety    Arthritis    Constipation 02/17/2015   Depression, recurrent ([place]) 07/24/2020   Emphysema lung (HCC)    Fibromyalgia    GERD (gastroesophageal reflux disease)    Hypertension    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 multiple 3 mm round and shallow erosions. 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 Hiatal hernia   LAPAROSCOPIC APPENDECTOMY N/A 06/21/2013   Procedure: APPENDECTOMY LAPAROSCOPIC;  Surgeon: Jamesetta So, MD;  Location: [facility] ORS;  Service: General;  Laterality: N/A;   MALONEY DILATION N/A 10/16/2013   Procedure: Keturah Shavers;  Surgeon: Daneil Dolin, MD;  Location: [facility] END[facility] SUITE;  Service:  Endoscopy;  Laterality: N/A;   PORTACATH PLACEMENT  02132012   Rt   IJ  Port-A-Cath  [email or web address]   STERNAL WIRES REMOVAL  01/23/2012   Procedure: STERNAL WIRES REMOVAL;  Surgeon: Edward B Gerhardt, MD;  Location: [facility] OR;  Service: Thoracic;  Laterality: N/A;   THORACOTOMY  [id number]   transsmanubrial anterolaterl thoracotomy with chest wall resection includinf the first rib and left  upper lobectomy for Pancoast turmor   TUBAL LIGATION      Family History  Problem Relation Age of Onset   Cancer Mother        lung   Hypertension Father    Stroke Father     Hypertension Sister    Heart disease Sister    Cancer Brother 66       pancreatic   Colon cancer Neg Hx     Social History   Socioeconomic History   Marital status: Married    Spouse name: Not on file   Number of children: Not on file   Years of education: Not on file   Highest education level: Not on file  Occupational History   Not on file  Tobacco Use   Smoking status: Former    Packs/day: 1.50    Years: 35.00    Pack years: 52.50    Types: Cigarettes    Quit date: 02/04/2010    Years since quitting: 10.4   Smokeless tobacco: Never   Tobacco comments:    Quit x 8 years  Vaping Use   Vaping Use: Never used  Substance and Sexual Activity   Alcohol use: Yes    Alcohol/week: 0.0 standard drinks    Comment: occasionally    Drug use: No   Sexual activity: Yes    Birth control/protection: Post-menopausal  Other Topics Concern   Not on file  Social History Narrative   Not on file   Social Determinants of Health   Financial Resource Strain: Not on file  Food Insecurity: Not on file  Transportation Needs: Not on file  Physical Activity: Not on file  Stress: Not on file  Social Connections: Not on file  Intimate Partner Violence: Not on file    Outpatient Medications Prior to Visit  Medication Sig Dispense Refill   ALPRAZolam (XANAX) 0.5 MG tablet Take 1 tablet (0.5 mg total) by mouth 2 (two) times daily as needed for anxiety. 30 tablet 0   diltiazem (CARDIZEM CD) 180 MG 24 hr capsule TAKE 1 CAPSULE 2 TIMES A DAY FOR BLOOD PRESSURE 60 capsule 5   HYDROcodone-acetaminophen (NORCO) 10-325 MG tablet Take 1 tablet by mouth 2 (two) times daily. 40 tablet 0   losartan (COZAAR) 100 MG tablet Take 1 tablet (100 mg total) by mouth daily. 90 tablet 3   PROAIR HFA 108 (90 Base) MCG/ACT inhaler 2 PUFFS EVERY 6 HOURS AS NEEDED FOR WHEEZING OR SHORTNESS OF BREATH 8.5 g 1   escitalopram (LEXAPRO) 10 MG tablet TAKE 1 TABLET DAILY 30 tablet 2   hydrochlorothiazide (MICROZIDE)  12.5 MG capsule Take 1 capsule (12.5 mg total) by mouth daily. 90 capsule 1   budesonide-formoterol (SYMBICORT) 80-4.5 MCG/ACT inhaler Inhale 2 puffs into the lungs 2 (two) times daily. (Patient not taking: Reported on 05/25/2020) 3 each 1   No facility-administered medications prior to visit.    Allergies  Allergen Reactions   Lisinopril Swelling    Of lips   Nitrofuran Derivatives     ROS Review of Systems  Constitutional: Negative.   HENT:   Negative.    Respiratory: Negative.    Gastrointestinal: Negative.   Skin:  Negative for rash.  Neurological: Negative.   Psychiatric/Behavioral:  The patient is nervous/anxious.   All other systems reviewed and are negative.    Objective:    Physical Exam Vitals and nursing note reviewed.  Constitutional:      Appearance: Normal appearance.  HENT:     Head: Normocephalic.     Nose: Nose normal.     Mouth/Throat:     Mouth: Mucous membranes are moist.     Pharynx: Oropharynx is clear.  Eyes:     Conjunctiva/sclera: Conjunctivae normal.  Cardiovascular:     Pulses: Normal pulses.     Heart sounds: Normal heart sounds.  Pulmonary:     Effort: Pulmonary effort is normal.     Breath sounds: Normal breath sounds.  Abdominal:     General: Bowel sounds are normal.  Skin:    Findings: No rash.  Neurological:     Mental Status: She is alert and oriented to person, place, and time.  Psychiatric:        Attention and Perception: Attention and perception normal.        Mood and Affect: Mood is anxious and depressed.        Speech: Speech normal.        Behavior: Behavior is cooperative.        Cognition and Memory: Cognition normal.        Judgment: Judgment normal.    BP (!) 152/92   Pulse 75   Temp 98 F (36.7 C) (Temporal)   Ht 5' 4" (1.626 m)   Wt 138 lb (62.6 kg)   SpO2 98%   BMI 23.69 kg/m  Wt Readings from Last 3 Encounters:  07/24/20 138 lb (62.6 kg)  05/25/20 142 lb (64.4 kg)  03/09/20 145 lb 3.2 oz (65.9 kg)      Health Maintenance Due  Topic Date Due   COVID-19 Vaccine (1) Never done   Pneumococcal Vaccine 43-61 Years old (1 - PCV) Never done   HIV Screening  Never done   TETANUS/TDAP  Never done    There are no preventive care reminders to display for this patient.  Lab Results  Component Value Date   TSH 1.570 05/10/2019   Lab Results  Component Value Date   WBC 8.0 05/25/2020   HGB 12.5 05/25/2020   HCT 37.1 05/25/2020   MCV 87 05/25/2020   PLT 333 05/25/2020   Lab Results  Component Value Date   NA 139 05/25/2020   K 4.1 05/25/2020   CO2 22 05/25/2020   GLUCOSE 91 05/25/2020   BUN 27 05/25/2020   CREATININE 0.86 05/25/2020   BILITOT 0.3 05/25/2020   ALKPHOS 183 (H) 05/25/2020   AST 31 05/25/2020   ALT 43 (H) 05/25/2020   PROT 7.1 05/25/2020   ALBUMIN 4.3 05/25/2020   CALCIUM 9.4 05/25/2020   ANIONGAP 10 10/22/2019   EGFR 77 05/25/2020   Lab Results  Component Value Date   CHOL 208 (H) 05/25/2020   Lab Results  Component Value Date   HDL 65 05/25/2020   Lab Results  Component Value Date   LDLCALC 115 (H) 05/25/2020   Lab Results  Component Value Date   TRIG 163 (H) 05/25/2020   Lab Results  Component Value Date   CHOLHDL 3.2 05/25/2020   Lab Results  Component Value Date   HGBA1C 5.9 (H) 02/17/2015  Assessment & Plan:   Problem List Items Addressed This Visit       Cardiovascular and Mediastinum   Hypertension - Primary    Hypertension not well controlled on current medication.  Increase hydrochlorothiazide from 12.5 mg tablet by mouth daily to 25 mg tablet by mouth daily.  Encourage patient to take blood pressure daily for 1 week.  Follow-up in 2 weeks.  Low-sodium diet, exercise at least 3 times a week. Rx sent to pharmacy.  Printed handouts given.       Relevant Medications   escitalopram (LEXAPRO) 20 MG tablet   hydrochlorothiazide (HYDRODIURIL) 25 MG tablet     Other   Anxiety    Anxiety not well controlled.  Completed  GAD-7.  Education on managing depression provided to patient.  Increase Lexapro from 10 mg tablet by mouth to 20 mg tablet by mouth.  Rx sent to pharmacy follow-up in 6 weeks.       Relevant Medications   escitalopram (LEXAPRO) 20 MG tablet   Depression, recurrent (HCC)    Depression not well controlled.  Completed PHQ-9 education on managing depression provided to patient.  Increase Lexapro from 10 mg tablet by mouth to 20 mg tablet by mouth.  Rx sent to pharmacy follow-up in 6 weeks.       Relevant Medications   escitalopram (LEXAPRO) 20 MG tablet    Meds ordered this encounter  Medications   escitalopram (LEXAPRO) 20 MG tablet    Sig: Take 1 tablet (20 mg total) by mouth daily.    Dispense:  30 tablet    Refill:  5    Order Specific Question:   Supervising Provider    Answer:   GOTTSCHALK, ASHLY M [[id number]]   hydrochlorothiazide (HYDRODIURIL) 25 MG tablet    Sig: Take 1 tablet (25 mg total) by mouth daily.    Dispense:  90 tablet    Refill:  3    Order Specific Question:   Supervising Provider    Answer:   GOTTSCHALK, ASHLY M [[id number]]    Follow-up: Return in about 6 weeks (around 09/04/2020) for depression, 2 weeks for Blood pressure.    Onyeje M Ijaola, NP 

## 2020-07-24 NOTE — Assessment & Plan Note (Signed)
Hypertension not well controlled on current medication.  Increase hydrochlorothiazide from 12.5 mg tablet by mouth daily to 25 mg tablet by mouth daily.  Encourage patient to take blood pressure daily for 1 week.  Follow-up in 2 weeks.  Low-sodium diet, exercise at least 3 times a week. Rx sent to pharmacy.  Printed handouts given.

## 2020-07-24 NOTE — Assessment & Plan Note (Signed)
Anxiety not well controlled.  Completed GAD-7.  Education on managing depression provided to patient.  Increase Lexapro from 10 mg tablet by mouth to 20 mg tablet by mouth.  Rx sent to pharmacy follow-up in 6 weeks.

## 2020-07-24 NOTE — Assessment & Plan Note (Signed)
Depression not well controlled.  Completed PHQ-9 education on managing depression provided to patient.  Increase Lexapro from 10 mg tablet by mouth to 20 mg tablet by mouth.  Rx sent to pharmacy follow-up in 6 weeks.

## 2020-07-28 ENCOUNTER — Telehealth: Payer: Self-pay | Admitting: Nurse Practitioner

## 2020-07-28 ENCOUNTER — Other Ambulatory Visit: Payer: Self-pay | Admitting: Family

## 2020-07-28 DIAGNOSIS — F419 Anxiety disorder, unspecified: Secondary | ICD-10-CM

## 2020-07-28 NOTE — Telephone Encounter (Signed)
  Prescription Request  07/28/2020  What is the name of the medication or equipment? xanax  Have you contacted your pharmacy to request a refill? (if applicable) yes  Which pharmacy would you like this sent to? [facility] pt was seen by Je on 07/24/20 for got to ask for refill she has one half of one pill left    Patient notified that their request is being sent to the clinical staff for review and that they should receive a response within 2 business days.

## 2020-07-29 ENCOUNTER — Other Ambulatory Visit: Payer: Self-pay

## 2020-07-29 DIAGNOSIS — F419 Anxiety disorder, unspecified: Secondary | ICD-10-CM

## 2020-07-29 MED ORDER — ALPRAZOLAM 0.5 MG PO TABS: 0.5000 mg | ORAL_TABLET | Freq: Two times a day (BID) | ORAL | 0 refills | Status: DC | PRN

## 2020-07-30 ENCOUNTER — Other Ambulatory Visit: Payer: Self-pay | Admitting: Family Medicine

## 2020-07-30 DIAGNOSIS — F419 Anxiety disorder, unspecified: Secondary | ICD-10-CM

## 2020-07-30 MED ORDER — ALPRAZOLAM 0.5 MG PO TABS: 0.5000 mg | ORAL_TABLET | Freq: Two times a day (BID) | ORAL | 0 refills | Status: DC | PRN

## 2020-07-30 NOTE — Telephone Encounter (Signed)
Rx sent in

## 2020-08-24 ENCOUNTER — Encounter: Payer: Self-pay | Admitting: Internal Medicine

## 2020-08-31 ENCOUNTER — Encounter: Payer: Self-pay | Admitting: Nurse Practitioner

## 2020-08-31 ENCOUNTER — Other Ambulatory Visit: Payer: Self-pay | Admitting: Nurse Practitioner

## 2020-09-01 ENCOUNTER — Other Ambulatory Visit: Payer: Self-pay | Admitting: Nurse Practitioner

## 2020-09-01 DIAGNOSIS — M25511 Pain in right shoulder: Secondary | ICD-10-CM

## 2020-09-02 ENCOUNTER — Telehealth: Payer: Self-pay | Admitting: Nurse Practitioner

## 2020-09-02 ENCOUNTER — Ambulatory Visit: Payer: 59 | Admitting: Nurse Practitioner

## 2020-09-02 ENCOUNTER — Other Ambulatory Visit: Payer: Self-pay | Admitting: Nurse Practitioner

## 2020-09-02 DIAGNOSIS — M25512 Pain in left shoulder: Secondary | ICD-10-CM

## 2020-09-02 NOTE — Telephone Encounter (Signed)
Patient is scheduled to have MRI done tomorrow but the order says she is to have MRI of right shoulder, but its supposed to be for her left shoulder.   Need to correct and send new order.

## 2020-09-03 ENCOUNTER — Ambulatory Visit (HOSPITAL_COMMUNITY)
Admission: RE | Admit: 2020-09-03 | Discharge: 2020-09-03 | Disposition: A | Discharge status: Home / Self Care | Payer: 59 | Source: Ambulatory Visit | Attending: Nurse Practitioner | Admitting: Nurse Practitioner

## 2020-09-03 ENCOUNTER — Other Ambulatory Visit: Payer: Self-pay

## 2020-09-03 DIAGNOSIS — M25512 Pain in left shoulder: Secondary | ICD-10-CM | POA: Insufficient documentation

## 2020-09-03 NOTE — Telephone Encounter (Signed)
Message left for patient that order has been corrected.

## 2020-09-11 ENCOUNTER — Encounter: Payer: Self-pay | Admitting: Nurse Practitioner

## 2020-09-11 ENCOUNTER — Ambulatory Visit: Payer: 59 | Admitting: Nurse Practitioner

## 2020-09-11 ENCOUNTER — Other Ambulatory Visit: Payer: Self-pay

## 2020-09-11 VITALS — BP 105/67 | HR 64 | Temp 97.1°F | Ht 64.0 in | Wt 139.0 lb

## 2020-09-11 DIAGNOSIS — F419 Anxiety disorder, unspecified: Secondary | ICD-10-CM | POA: Diagnosis not present

## 2020-09-11 DIAGNOSIS — F339 Major depressive disorder, recurrent, unspecified: Secondary | ICD-10-CM

## 2020-09-11 DIAGNOSIS — M5030 Other cervical disc degeneration, unspecified cervical region: Secondary | ICD-10-CM

## 2020-09-11 MED ORDER — ALPRAZOLAM 0.5 MG PO TABS: 0.5000 mg | ORAL_TABLET | Freq: Two times a day (BID) | ORAL | 0 refills | Status: DC | PRN

## 2020-09-11 MED ORDER — HYDROCODONE-ACETAMINOPHEN 10-325 MG PO TABS: 1.0000 | ORAL_TABLET | Freq: Three times a day (TID) | ORAL | 0 refills | Status: AC | PRN

## 2020-09-11 NOTE — Progress Notes (Signed)
Established Patient Office Visit  Subjective:  Patient ID: [name], female    DOB: Dec 02, 1960  Age: 60 y.o. MRN: [id number]  CC:  Chief Complaint  Patient presents with   Depression    HPI [name] presents for Depression, Follow-up  She  was last seen for this 6 weeks ago. Changes made at last visit include increased lexapro to 20 mg tablet by mouth daily   She reports good compliance with treatment. She is not having side effects.   She reports good tolerance of treatment. Current symptoms include: PHQ-9 = 2 She feels she is Improved since last visit.  Depression screen [facility] 2/9 07/24/2020 03/09/2020 12/05/2019  Decreased Interest 2 0 0  Down, Depressed, Hopeless 3 0 0  PHQ - 2 Score 5 0 0  Altered sleeping 3 - 0  Tired, decreased energy 2 - 0  Change in appetite 2 - 0  Feeling bad or failure about yourself  2 - 0  Trouble concentrating 3 - 0  Moving slowly or fidgety/restless 1 - 0  Suicidal thoughts 0 - 0  PHQ-9 Score 18 - 0  Difficult doing work/chores Very difficult - -      Past Medical History:  Diagnosis Date   Anxiety    Arthritis    Constipation 02/17/2015   Depression, recurrent ([place]) 07/24/2020   Emphysema lung (HCC)    Fibromyalgia    GERD (gastroesophageal reflux disease)    Hypertension    Lung cancer ([place]) 2012   pancoast tumor   Menopausal vaginal dryness 02/17/2015    Past Surgical History:  Procedure Laterality Date   CHOLECYSTECTOMY N/A 04/01/2016   Procedure: LAPAROSCOPIC CHOLECYSTECTOMY;  Surgeon: Aviva Signs, MD;  Location: [facility];  Service: General;  Laterality: N/A;   COLONOSCOPY  11/2010   normal colonoscopy from anal verge to ileocecal valve; no polyps, masses, AVMs, diverticula, or evidence of inflammation   ESOPHAGOGASTRODUODENOSCOPY N/A 10/16/2013   EEF:EOFHQRFX ring s/p dilation/2 cm HH. multiple 3 mm round and shallow erosions. 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 Hiatal hernia   LAPAROSCOPIC APPENDECTOMY N/A 06/21/2013   Procedure: APPENDECTOMY LAPAROSCOPIC;  Surgeon: Jamesetta So, MD;  Location: [facility];  Service: General;  Laterality: N/A;   MALONEY DILATION N/A 10/16/2013   Procedure: Keturah Shavers;  Surgeon: Daneil Dolin, MD;  Location: [facility];  Service: Endoscopy;  Laterality: N/ASol Passer PLACEMENT  [id number]   Rt   IJ  Port-A-Cath  [email or web address]   STERNAL WIRES REMOVAL  01/23/2012   Procedure: STERNAL WIRES REMOVAL;  Surgeon: Grace Isaac, MD;  Location: [facility];  Service: Thoracic;  Laterality: N/A;   THORACOTOMY  [id number]   transsmanubrial anterolaterl thoracotomy with chest wall resection includinf the first rib and left  upper lobectomy for Pancoast turmor   TUBAL LIGATION      Family History  Problem Relation Age of Onset   Cancer Mother        lung   Hypertension Father    Stroke Father    Hypertension Sister    Heart disease Sister    Cancer Brother 16       pancreatic   Colon cancer Neg Hx     Social History   Socioeconomic History   Marital status: Married    Spouse name: Not on file   Number of children: Not on file   Years of education: Not  on file   Highest education level: Not on file  Occupational History   Not on file  Tobacco Use   Smoking status: Former    Packs/day: 1.50    Years: 35.00    Pack years: 52.50    Types: Cigarettes    Quit date: 02/04/2010    Years since quitting: 10.6   Smokeless tobacco: Never   Tobacco comments:    Quit x 8 years  Vaping Use   Vaping Use: Never used  Substance and Sexual Activity   Alcohol use: Yes    Alcohol/week: 0.0 standard drinks    Comment: occasionally    Drug use: No   Sexual activity: Yes    Birth control/protection: Post-menopausal  Other Topics Concern   Not on file  Social History Narrative   Not on file   Social Determinants of Health   Financial Resource Strain: Not on file   Food Insecurity: Not on file  Transportation Needs: Not on file  Physical Activity: Not on file  Stress: Not on file  Social Connections: Not on file  Intimate Partner Violence: Not on file    Outpatient Medications Prior to Visit  Medication Sig Dispense Refill   diltiazem (CARDIZEM CD) 180 MG 24 hr capsule TAKE 1 CAPSULE 2 TIMES A DAY FOR BLOOD PRESSURE 60 capsule 5   escitalopram (LEXAPRO) 20 MG tablet Take 1 tablet (20 mg total) by mouth daily. 30 tablet 5   hydrochlorothiazide (HYDRODIURIL) 25 MG tablet Take 1 tablet (25 mg total) by mouth daily. 90 tablet 3   losartan (COZAAR) 100 MG tablet Take 1 tablet (100 mg total) by mouth daily. 90 tablet 3   PROAIR HFA 108 (90 Base) MCG/ACT inhaler 2 PUFFS EVERY 6 HOURS AS NEEDED FOR WHEEZING OR SHORTNESS OF BREATH 8.5 g 1   ALPRAZolam (XANAX) 0.5 MG tablet Take 1 tablet (0.5 mg total) by mouth 2 (two) times daily as needed for anxiety. 30 tablet 0   HYDROcodone-acetaminophen (NORCO) 10-325 MG tablet Take 1 tablet by mouth 2 (two) times daily. 40 tablet 0   No facility-administered medications prior to visit.    Allergies  Allergen Reactions   Lisinopril Swelling    Of lips   Nitrofuran Derivatives     ROS Review of Systems  Constitutional: Negative.   HENT: Negative.    Eyes: Negative.   Respiratory: Negative.    Cardiovascular: Negative.   Gastrointestinal: Negative.   Musculoskeletal:  Positive for back pain.  Skin:  Negative for rash.  All other systems reviewed and are negative.    Objective:    Physical Exam Vitals and nursing note reviewed.  Constitutional:      Appearance: Normal appearance.  HENT:     Head: Normocephalic.     Nose: Nose normal.     Mouth/Throat:     Mouth: Mucous membranes are moist.     Pharynx: Oropharynx is clear.  Eyes:     Conjunctiva/sclera: Conjunctivae normal.  Cardiovascular:     Rate and Rhythm: Normal rate and regular rhythm.     Pulses: Normal pulses.     Heart sounds:  Normal heart sounds.  Pulmonary:     Effort: Pulmonary effort is normal.     Breath sounds: Normal breath sounds.  Abdominal:     General: Bowel sounds are normal.  Skin:    General: Skin is dry.     Findings: No rash.  Neurological:     Mental Status: She is alert and oriented  to person, place, and time.  Psychiatric:        Attention and Perception: Attention and perception normal.        Mood and Affect: Mood is anxious and depressed.        Speech: Speech normal.        Behavior: Behavior is cooperative.   Pain  She reports chronic lower back (DDD) pain. was not an injury that may have caused the pain. The pain started a few years ago and is staying constant. The pain does not radiate . The pain is described as aching, is severe in intensity, occurring intermittently. Symptoms are worse in the: morning, mid-day  Aggravating factors: walking Relieving factors: medication Norco .  She has tried prescription pain relievers with significant relief.     BP 105/67   Pulse 64   Temp (!) 97.1 F (36.2 C) (Temporal)   Ht _0  (1.626 m)   Wt 139 lb (63 kg)   BMI 23.86 kg/m  Wt Readings from Last 3 Encounters:  09/11/20 139 lb (63 kg)  07/24/20 138 lb (62.6 kg)  05/25/20 142 lb (64.4 kg)     Health Maintenance Due  Topic Date Due   COVID-19 Vaccine (1) Never done   Pneumococcal Vaccine 55-44 Years old (1 - PCV) Never done   HIV Screening  Never done   TETANUS/TDAP  Never done   INFLUENZA VACCINE  08/24/2020    There are no preventive care reminders to display for this patient.  Lab Results  Component Value Date   TSH 1.570 05/10/2019   Lab Results  Component Value Date   WBC 8.0 05/25/2020   HGB 12.5 05/25/2020   HCT 37.1 05/25/2020   MCV 87 05/25/2020   PLT 333 05/25/2020   Lab Results  Component Value Date   NA 139 05/25/2020   K 4.1 05/25/2020   CO2 22 05/25/2020   GLUCOSE 91 05/25/2020   BUN 27 05/25/2020   CREATININE 0.86 05/25/2020   BILITOT 0.3  05/25/2020   ALKPHOS 183 (H) 05/25/2020   AST 31 05/25/2020   ALT 43 (H) 05/25/2020   PROT 7.1 05/25/2020   ALBUMIN 4.3 05/25/2020   CALCIUM 9.4 05/25/2020   ANIONGAP 10 10/22/2019   EGFR 77 05/25/2020   Lab Results  Component Value Date   CHOL 208 (H) 05/25/2020   Lab Results  Component Value Date   HDL 65 05/25/2020   Lab Results  Component Value Date   LDLCALC 115 (H) 05/25/2020   Lab Results  Component Value Date   TRIG 163 (H) 05/25/2020   Lab Results  Component Value Date   CHOLHDL 3.2 05/25/2020   Lab Results  Component Value Date   HGBA1C 5.9 (H) 02/17/2015      Assessment & Plan:   Problem List Items Addressed This Visit       Musculoskeletal and Integument   DDD (degenerative disc disease), cervical - Primary    DDD symptoms are significantly managed with current pain medication hydrocodone 10-325 mg tablet by mouth as needed. Refill sent to pharmacy, continue back strengthen exercise .       Relevant Medications   HYDROcodone-acetaminophen (NORCO) 10-325 MG tablet     Other   Anxiety    Symptoms well managed with current medication no changes to medication dose, refill sent to pharmacy. Follow up with worsening unresolved symptoms  OI      Relevant Medications   ALPRAZolam (XANAX) 0.5 MG tablet  Depression, recurrent ([place])    Depression well managed on lexapro 20 mg tablet by mouth daily, no changes to current dose.  Education provided printed hand out given. Follow up in 3 months.      Relevant Medications   ALPRAZolam (XANAX) 0.5 MG tablet    Meds ordered this encounter  Medications   HYDROcodone-acetaminophen (NORCO) 10-325 MG tablet    Sig: Take 1 tablet by mouth every 8 (eight) hours as needed for up to 5 days.    Dispense:  40 tablet    Refill:  0    Order Specific Question:   Supervising Provider    Answer:   Janora Norlander [[id number]]   ALPRAZolam Duanne Moron) 0.5 MG tablet    Sig: Take 1 tablet (0.5 mg total) by mouth 2  (two) times daily as needed for anxiety.    Dispense:  30 tablet    Refill:  0    Order Specific Question:   Supervising Provider    Answer:   Janora Norlander [[id number]]    Follow-up: Return in about 3 months (around 12/12/2020).    Ivy Lynn, NP

## 2020-09-11 NOTE — Assessment & Plan Note (Signed)
Symptoms well managed with current medication no changes to medication dose, refill sent to pharmacy. Follow up with worsening unresolved symptoms  OI

## 2020-09-11 NOTE — Patient Instructions (Signed)
http://APA.org/depression-guideline"> https://clinicalkey.com"> http://point-of-care.elsevierperformancemanager.com/skills/"> http://point-of-care.elsevierperformancemanager.com">  Managing Depression, Adult Depression is a mental health condition that affects your thoughts, feelings, and actions. Being diagnosed with depression can bring you relief if you did not know why you have felt or behaved a certain way. It could also leave you feeling overwhelmed with uncertainty about your future. Preparing yourself tomanage your symptoms can help you feel more positive about your future. How to manage lifestyle changes Managing stress  Stress is your body's reaction to life changes and events, both good and bad. Stress can add to your feelings of depression. Learning to manage your stresscan help lessen your feelings of depression. Try some of the following approaches to reducing your stress (stress reduction techniques): Listen to music that you enjoy and that inspires you. Try using a meditation app or take a meditation class. Develop a practice that helps you connect with your spiritual self. Walk in nature, pray, or go to a place of worship. Do some deep breathing. To do this, inhale slowly through your nose. Pause at the top of your inhale for a few seconds and then exhale slowly, letting your muscles relax. Practice yoga to help relax and work your muscles. Choose a stress reduction technique that suits your lifestyle and personality. These techniques take time and practice to develop. Set aside 5-15 minutes a day to do them. Therapists can offer training in these techniques. Other things you can do to manage stress include: Keeping a stress diary. Knowing your limits and saying no when you think something is too much. Paying attention to how you react to certain situations. You may not be able to control everything, but you can change your reaction. Adding humor to your life by watching funny films  or TV shows. Making time for activities that you enjoy and that relax you.  Medicines Medicines, such as antidepressants, are often a part of treatment for depression. Talk with your pharmacist or health care provider about all the medicines, supplements, and herbal products that you take, their possible side effects, and what medicines and other products are safe to take together. Make sure to report any side effects you may have to your health care provider. Relationships Your health care provider may suggest family therapy, couples therapy, orindividual therapy as part of your treatment. How to recognize changes Everyone responds differently to treatment for depression. As you recover from depression, you may start to: Have more interest in doing activities. Feel less hopeless. Have more energy. Overeat less often, or have a better appetite. Have better mental focus. It is important to recognize if your depression is not getting better or is getting worse. The symptoms you had in the beginning may return, such as: Tiredness (fatigue) or low energy. Eating too much or too little. Sleeping too much or too little. Feeling restless, agitated, or hopeless. Trouble focusing or making decisions. Unexplained physical complaints. Feeling irritable, angry, or aggressive. If you or your family members notice these symptoms coming back, let yourhealth care provider know right away. Follow these instructions at home: Activity  Try to get some form of exercise each day, such as walking, biking, swimming, or lifting weights. Practice stress reduction techniques. Engage your mind by taking a class or doing some volunteer work.  Lifestyle Get the right amount and quality of sleep. Cut down on using caffeine, tobacco, alcohol, and other potentially harmful substances. Eat a healthy diet that includes plenty of vegetables, fruits, whole grains, low-fat dairy products, and lean protein. Do not  eat  a lot of foods that are high in solid fats, added sugars, or salt (sodium). General instructions Take over-the-counter and prescription medicines only as told by your health care provider. Keep all follow-up visits as told by your health care provider. This is important. Where to find support Talking to others  Friends and family members can be sources of support and guidance. Talk to trusted friends or family members about your condition. Explain your symptoms to them, and let them know that you are working with a health care provider to treat your depression. Tell friends and family members how they also can behelpful. Finances Find appropriate mental health providers that fit with your financial situation. Talk with your health care provider about options to get reduced prices on your medicines. Where to find more information You can find support in your area from: Anxiety and Depression Association of America (ADAA): www.adaa.org Mental Health America: www.mentalhealthamerica.net [employer] on Mental Illness: www.nami.org Contact a health care provider if: You stop taking your antidepressant medicines, and you have any of these symptoms: Nausea. Headache. Light-headedness. Chills and body aches. Not being able to sleep (insomnia). You or your friends and family think your depression is getting worse. Get help right away if: You have thoughts of hurting yourself or others. If you ever feel like you may hurt yourself or others, or have thoughts about taking your own life, get help right away. Go to your nearest emergency department or: Call your local emergency services (911 in the [place] Call a suicide crisis helpline, such as the [place] at (301)604-6526. This is open 24 hours a day in the [place] Text the Crisis Text Line at 480-189-9561 (in the [place].). Summary If you are diagnosed with depression, preparing yourself to manage your symptoms is a good way  to feel positive about your future. Work with your health care provider on a management plan that includes stress reduction techniques, medicines (if applicable), therapy, and healthy lifestyle habits. Keep talking with your health care provider about how your treatment is working. If you have thoughts about taking your own life, call a suicide crisis helpline or text a crisis text line. This information is not intended to replace advice given to you by your health care provider. Make sure you discuss any questions you have with your healthcare provider. Document Revised: 11/21/2018 Document Reviewed: 11/21/2018 Elsevier Patient Education  2022 [employer].

## 2020-09-11 NOTE — Assessment & Plan Note (Signed)
DDD symptoms are significantly managed with current pain medication hydrocodone 10-325 mg tablet by mouth as needed. Refill sent to pharmacy, continue back strengthen exercise .

## 2020-09-11 NOTE — Assessment & Plan Note (Signed)
Depression well managed on lexapro 20 mg tablet by mouth daily, no changes to current dose.  Education provided printed hand out given. Follow up in 3 months.

## 2020-11-02 ENCOUNTER — Telehealth: Payer: Self-pay | Admitting: Nurse Practitioner

## 2020-11-02 NOTE — Telephone Encounter (Signed)
Pt has appt 11/09/20

## 2020-11-09 ENCOUNTER — Encounter: Payer: Self-pay | Admitting: Nurse Practitioner

## 2020-11-09 ENCOUNTER — Ambulatory Visit: Payer: 59 | Admitting: Nurse Practitioner

## 2020-11-09 ENCOUNTER — Other Ambulatory Visit: Payer: Self-pay

## 2020-11-09 ENCOUNTER — Other Ambulatory Visit: Payer: Self-pay | Admitting: Nurse Practitioner

## 2020-11-09 DIAGNOSIS — M5030 Other cervical disc degeneration, unspecified cervical region: Secondary | ICD-10-CM | POA: Diagnosis not present

## 2020-11-09 DIAGNOSIS — F419 Anxiety disorder, unspecified: Secondary | ICD-10-CM | POA: Diagnosis not present

## 2020-11-09 DIAGNOSIS — F339 Major depressive disorder, recurrent, unspecified: Secondary | ICD-10-CM

## 2020-11-09 DIAGNOSIS — Z23 Encounter for immunization: Secondary | ICD-10-CM | POA: Diagnosis not present

## 2020-11-09 DIAGNOSIS — I10 Essential (primary) hypertension: Secondary | ICD-10-CM

## 2020-11-09 DIAGNOSIS — Z139 Encounter for screening, unspecified: Secondary | ICD-10-CM

## 2020-11-09 MED ORDER — ALPRAZOLAM 0.5 MG PO TABS: 0.5000 mg | ORAL_TABLET | Freq: Two times a day (BID) | ORAL | 0 refills | Status: DC | PRN

## 2020-11-09 MED ORDER — CELECOXIB 200 MG PO CAPS: 200.0000 mg | ORAL_CAPSULE | Freq: Every day | ORAL | 3 refills | Status: DC

## 2020-11-09 MED ORDER — LOSARTAN POTASSIUM 100 MG PO TABS: 100.0000 mg | ORAL_TABLET | Freq: Every day | ORAL | 3 refills | Status: DC

## 2020-11-09 NOTE — Assessment & Plan Note (Signed)
Patients anxiety is gradually improving, completed GAD-7 no changes to current dose, education provided on stress management.  RX sent to PHARMACY. Follow up with worsening unresolved symptoms.

## 2020-11-09 NOTE — Assessment & Plan Note (Signed)
Continue Celebrex for pain control. Follow up with worsening unresolved symptoms.

## 2020-11-09 NOTE — Assessment & Plan Note (Signed)
Hypertension well controlled on current medication, no changes to current dose. Continue low sodium diet. completed CBC, CMP and lipid panel results pending.

## 2020-11-09 NOTE — Progress Notes (Signed)
Established Patient Office Visit  Subjective:  Patient ID: [name], female    DOB: 01/02/1961  Age: 60 y.o. MRN: [id number]  CC:  Chief Complaint  Patient presents with   Medical Management of Chronic Issues    HPI [name] presents for follow up of hypertension. Patient was diagnosed in 07/24/2020. The patient is tolerating the medication well without side effects. Compliance with treatment has been good; including taking medication as directed , maintains a healthy diet and regular exercise regimen , and following up as directed. Current medication hydrochlorothiazide 25 mg tablet by mouth daily.  Anxiety, Follow-up  She was last seen for anxiety 3 months ago. Changes made at last visit include Xanax 0.5 mg tablet by mouth daily.   She reports good compliance with treatment. She reports good tolerance of treatment. She is not having side effects.   She feels her anxiety is mild and Improved since last visit.  Symptoms: No chest pain No difficulty concentrating  No dizziness No fatigue  No feelings of losing control No insomnia  No irritable No palpitations  No panic attacks No racing thoughts  No shortness of breath No sweating  No tremors/shakes    GAD-7 Results GAD-7 Generalized Anxiety Disorder Screening Tool 11/09/2020 07/24/2020 03/09/2020  1. Feeling Nervous, Anxious, or on Edge $Remov'1 3 3  'ZSBaqb$ 2. Not Being Able to Stop or Control Worrying 0 1 0  3. Worrying Too Much About Different Things 0 0 0  4. Trouble Relaxing 0 3 1  5. Being So Restless it's Hard To Sit Still 0 1 0  6. Becoming Easily Annoyed or Irritable $RemoveBefo'1 3 1  'BpiDQvSIBKi$ 7. Feeling Afraid As If Something Awful Might Happen 0 0 0  Total GAD-7 Score $RemoveBef'2 11 5  'twHejZYYSE$ Difficulty At Work, Home, or Getting  Along With Others? Not difficult at all Very difficult Somewhat difficult    PHQ-9 Scores PHQ9 SCORE ONLY 11/09/2020 07/24/2020 03/09/2020  PHQ-9 Total Score 2 18 0   Depression, Follow-up  She  was last seen for  this 3 months ago. Changes made at last visit include Lexapro 20 mg tablet by mouth daily.   She reports good compliance with treatment. She is not having side effects.   She reports good tolerance of treatment. Current symptoms include: depressed mood She feels she is Improved since last visit.  Depression screen [facility] 2/9 11/09/2020 07/24/2020 03/09/2020  Decreased Interest 0 2 0  Down, Depressed, Hopeless 0 3 0  PHQ - 2 Score 0 5 0  Altered sleeping 1 3 -  Tired, decreased energy 1 2 -  Change in appetite 0 2 -  Feeling bad or failure about yourself  0 2 -  Trouble concentrating 0 3 -  Moving slowly or fidgety/restless 0 1 -  Suicidal thoughts 0 0 -  PHQ-9 Score 2 18 -  Difficult doing work/chores Not difficult at all Very difficult -  Some recent data might be hidden     Back Pain  She reports chronic back pain. There was not an injury that may have caused the pain. The most recent episode started  few weeks ago and is gradually worsening. The pain is located in the across the lower backwith radiation. It is described as aching, is moderate and severe in intensity, occurring constantly. Symptoms are worse in the: all day  Aggravating factors: walking Relieving factors: Resting .  She has tried acetaminophen and NSAIDs with mild relief.   Associated symptoms: No  abdominal pain No bowel incontinence  No chest pain No dysuria   No fever No headaches  No joint pains No pelvic pain  No weakness in leg  No tingling in lower extremities  No urinary incontinence No weight loss    -----------------------------------------------------------------------------------------   Past Medical History:  Diagnosis Date   Anxiety    Arthritis    Constipation 02/17/2015   Depression, recurrent ([place]) 07/24/2020   Emphysema lung (HCC)    Fibromyalgia    GERD (gastroesophageal reflux disease)    Hypertension    Lung cancer ([place]) 2012   pancoast tumor   Menopausal vaginal dryness 02/17/2015     Past Surgical History:  Procedure Laterality Date   CHOLECYSTECTOMY N/A 04/01/2016   Procedure: LAPAROSCOPIC CHOLECYSTECTOMY;  Surgeon: Aviva Signs, MD;  Location: [facility];  Service: General;  Laterality: N/A;   COLONOSCOPY  11/2010   normal colonoscopy from anal verge to ileocecal valve; no polyps, masses, AVMs, diverticula, or evidence of inflammation   ESOPHAGOGASTRODUODENOSCOPY N/A 10/16/2013   BJY:NWGNFAOZ ring s/p dilation/2 cm HH. multiple 3 mm round and shallow erosions. 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 Hiatal hernia   LAPAROSCOPIC APPENDECTOMY N/A 06/21/2013   Procedure: APPENDECTOMY LAPAROSCOPIC;  Surgeon: Jamesetta So, MD;  Location: [facility];  Service: General;  Laterality: N/A;   MALONEY DILATION N/A 10/16/2013   Procedure: Keturah Shavers;  Surgeon: Daneil Dolin, MD;  Location: [facility];  Service: Endoscopy;  Laterality: N/ASol Passer PLACEMENT  [id number]   Rt   IJ  Port-A-Cath  [email or web address]   STERNAL WIRES REMOVAL  01/23/2012   Procedure: STERNAL WIRES REMOVAL;  Surgeon: Grace Isaac, MD;  Location: [facility];  Service: Thoracic;  Laterality: N/A;   THORACOTOMY  [id number]   transsmanubrial anterolaterl thoracotomy with chest wall resection includinf the first rib and left  upper lobectomy for Pancoast turmor   TUBAL LIGATION      Family History  Problem Relation Age of Onset   Cancer Mother        lung   Hypertension Father    Stroke Father    Hypertension Sister    Heart disease Sister    Cancer Brother 16       pancreatic   Colon cancer Neg Hx     Social History   Socioeconomic History   Marital status: Married    Spouse name: Not on file   Number of children: Not on file   Years of education: Not on file   Highest education level: Not on file  Occupational History   Not on file  Tobacco Use   Smoking status: Former    Packs/day: 1.50    Years: 35.00     Pack years: 52.50    Types: Cigarettes    Quit date: 02/04/2010    Years since quitting: 10.7   Smokeless tobacco: Never   Tobacco comments:    Quit x 8 years  Vaping Use   Vaping Use: Never used  Substance and Sexual Activity   Alcohol use: Yes    Alcohol/week: 0.0 standard drinks    Comment: occasionally    Drug use: No   Sexual activity: Yes    Birth control/protection: Post-menopausal  Other Topics Concern   Not on file  Social History Narrative   Not on file   Social Determinants of Health   Financial Resource Strain: Not on file  Food Insecurity: Not  on file  Transportation Needs: Not on file  Physical Activity: Not on file  Stress: Not on file  Social Connections: Not on file  Intimate Partner Violence: Not on file    Outpatient Medications Prior to Visit  Medication Sig Dispense Refill   diltiazem (CARDIZEM CD) 180 MG 24 hr capsule TAKE 1 CAPSULE 2 TIMES A DAY FOR BLOOD PRESSURE 60 capsule 5   escitalopram (LEXAPRO) 20 MG tablet Take 1 tablet (20 mg total) by mouth daily. 30 tablet 5   hydrochlorothiazide (HYDRODIURIL) 25 MG tablet Take 1 tablet (25 mg total) by mouth daily. 90 tablet 3   PROAIR HFA 108 (90 Base) MCG/ACT inhaler 2 PUFFS EVERY 6 HOURS AS NEEDED FOR WHEEZING OR SHORTNESS OF BREATH 8.5 g 1   ALPRAZolam (XANAX) 0.5 MG tablet Take 1 tablet (0.5 mg total) by mouth 2 (two) times daily as needed for anxiety. 30 tablet 0   losartan (COZAAR) 100 MG tablet Take 1 tablet (100 mg total) by mouth daily. 90 tablet 3   No facility-administered medications prior to visit.    Allergies  Allergen Reactions   Lisinopril Swelling    Of lips   Nitrofuran Derivatives     ROS Review of Systems  Constitutional: Negative.   HENT: Negative.    Eyes: Negative.   Respiratory: Negative.    Gastrointestinal: Negative.   Genitourinary: Negative.   Skin:  Negative for rash.  Neurological: Negative.   Psychiatric/Behavioral:  The patient is nervous/anxious.    All other systems reviewed and are negative.    Objective:    Physical Exam Vitals and nursing note reviewed.  Constitutional:      Appearance: Normal appearance.  HENT:     Head: Normocephalic.     Nose: Nose normal.  Eyes:     Conjunctiva/sclera: Conjunctivae normal.  Cardiovascular:     Rate and Rhythm: Normal rate and regular rhythm.     Pulses: Normal pulses.     Heart sounds: Normal heart sounds.  Pulmonary:     Effort: Pulmonary effort is normal.     Breath sounds: Normal breath sounds.  Musculoskeletal:        General: Normal range of motion.  Skin:    General: Skin is warm.     Findings: No rash.  Neurological:     Mental Status: She is alert and oriented to person, place, and time.  Psychiatric:        Attention and Perception: Attention and perception normal.        Mood and Affect: Mood is anxious and depressed.        Behavior: Behavior is cooperative.        Thought Content: Thought content normal.        Cognition and Memory: Cognition and memory normal.        Judgment: Judgment normal.    There were no vitals taken for this visit. Wt Readings from Last 3 Encounters:  09/11/20 139 lb (63 kg)  07/24/20 138 lb (62.6 kg)  05/25/20 142 lb (64.4 kg)     Health Maintenance Due  Topic Date Due   COVID-19 Vaccine (1) Never done   HIV Screening  Never done   TETANUS/TDAP  Never done    There are no preventive care reminders to display for this patient.  Lab Results  Component Value Date   TSH 1.570 05/10/2019   Lab Results  Component Value Date   WBC 8.0 05/25/2020   HGB 12.5 05/25/2020  HCT 37.1 05/25/2020   MCV 87 05/25/2020   PLT 333 05/25/2020   Lab Results  Component Value Date   NA 139 05/25/2020   K 4.1 05/25/2020   CO2 22 05/25/2020   GLUCOSE 91 05/25/2020   BUN 27 05/25/2020   CREATININE 0.86 05/25/2020   BILITOT 0.3 05/25/2020   ALKPHOS 183 (H) 05/25/2020   AST 31 05/25/2020   ALT 43 (H) 05/25/2020   PROT 7.1 05/25/2020    ALBUMIN 4.3 05/25/2020   CALCIUM 9.4 05/25/2020   ANIONGAP 10 10/22/2019   EGFR 77 05/25/2020   Lab Results  Component Value Date   CHOL 208 (H) 05/25/2020   Lab Results  Component Value Date   HDL 65 05/25/2020   Lab Results  Component Value Date   LDLCALC 115 (H) 05/25/2020   Lab Results  Component Value Date   TRIG 163 (H) 05/25/2020   Lab Results  Component Value Date   CHOLHDL 3.2 05/25/2020   Lab Results  Component Value Date   HGBA1C 5.9 (H) 02/17/2015      Assessment & Plan:   Problem List Items Addressed This Visit       Cardiovascular and Mediastinum   Hypertension - Primary    Hypertension well controlled on current medication, no changes to current dose. Continue low sodium diet. completed CBC, CMP and lipid panel results pending.        Relevant Medications   losartan (COZAAR) 100 MG tablet   Other Relevant Orders   CBC with Differential   Comprehensive metabolic panel   Lipid Panel     Musculoskeletal and Integument   DDD (degenerative disc disease), cervical    Continue Celebrex for pain control. Follow up with worsening unresolved symptoms.       Relevant Medications   celecoxib (CELEBREX) 200 MG capsule     Other   Anxiety    Patients anxiety is gradually improving, completed GAD-7 no changes to current dose, education provided on stress management.  RX sent to PHARMACY. Follow up with worsening unresolved symptoms.      Relevant Medications   ALPRAZolam (XANAX) 0.5 MG tablet   Other Relevant Orders   POCT Urine drug screen   Depression, recurrent (HCC)   Relevant Medications   ALPRAZolam (XANAX) 0.5 MG tablet    Meds ordered this encounter  Medications   celecoxib (CELEBREX) 200 MG capsule    Sig: Take 1 capsule (200 mg total) by mouth daily.    Dispense:  60 capsule    Refill:  3    Order Specific Question:   Supervising Provider    Answer:   Claretta Fraise [[id number]]   losartan (COZAAR) 100 MG tablet    Sig: Take 1  tablet (100 mg total) by mouth daily.    Dispense:  90 tablet    Refill:  3    Order Specific Question:   Supervising Provider    Answer:   Claretta Fraise [[id number]]   ALPRAZolam (XANAX) 0.5 MG tablet    Sig: Take 1 tablet (0.5 mg total) by mouth 2 (two) times daily as needed for anxiety.    Dispense:  30 tablet    Refill:  0    Order Specific Question:   Supervising Provider    Answer:   Claretta Fraise [[id number]]    Follow-up: Return in about 3 months (around 02/09/2021).    Ivy Lynn, NP

## 2020-11-09 NOTE — Patient Instructions (Signed)
Managing Depression, Adult Depression is a mental health condition that affects your thoughts, feelings, and actions. Being diagnosed with depression can bring you relief if you did not know why you have felt or behaved a certain way. It could also leave you feeling overwhelmed with uncertainty about your future. Preparing yourself to manage your symptoms can help you feel more positive about your future. How to manage lifestyle changes Managing stress Stress is your body's reaction to life changes and events, both good and bad. Stress can add to your feelings of depression. Learning to manage your stress can help lessen your feelings of depression. Try some of the following approaches to reducing your stress (stress reduction techniques): Listen to music that you enjoy and that inspires you. Try using a meditation app or take a meditation class. Develop a practice that helps you connect with your spiritual self. Walk in nature, pray, or go to a place of worship. Do some deep breathing. To do this, inhale slowly through your nose. Pause at the top of your inhale for a few seconds and then exhale slowly, letting your muscles relax. Practice yoga to help relax and work your muscles. Choose a stress reduction technique that suits your lifestyle and personality. These techniques take time and practice to develop. Set aside 5-15 minutes a day to do them. Therapists can offer training in these techniques. Other things you can do to manage stress include: Keeping a stress diary. Knowing your limits and saying no when you think something is too much. Paying attention to how you react to certain situations. You may not be able to control everything, but you can change your reaction. Adding humor to your life by watching funny films or TV shows. Making time for activities that you enjoy and that relax you.  Medicines Medicines, such as antidepressants, are often a part of treatment for depression. Talk  with your pharmacist or health care provider about all the medicines, supplements, and herbal products that you take, their possible side effects, and what medicines and other products are safe to take together. Make sure to report any side effects you may have to your health care provider. Relationships Your health care provider may suggest family therapy, couples therapy, or individual therapy as part of your treatment. How to recognize changes Everyone responds differently to treatment for depression. As you recover from depression, you may start to: Have more interest in doing activities. Feel less hopeless. Have more energy. Overeat less often, or have a better appetite. Have better mental focus. It is important to recognize if your depression is not getting better or is getting worse. The symptoms you had in the beginning may return, such as: Tiredness (fatigue) or low energy. Eating too much or too little. Sleeping too much or too little. Feeling restless, agitated, or hopeless. Trouble focusing or making decisions. Unexplained physical complaints. Feeling irritable, angry, or aggressive. If you or your family members notice these symptoms coming back, let your health care provider know right away. Follow these instructions at home: Activity  Try to get some form of exercise each day, such as walking, biking, swimming, or lifting weights. Practice stress reduction techniques. Engage your mind by taking a class or doing some volunteer work. Lifestyle Get the right amount and quality of sleep. Cut down on using caffeine, tobacco, alcohol, and other potentially harmful substances. Eat a healthy diet that includes plenty of vegetables, fruits, whole grains, low-fat dairy products, and lean protein. Do not eat a lot  of foods that are high in solid fats, added sugars, or salt (sodium). General instructions Take over-the-counter and prescription medicines only as told by your health  care provider. Keep all follow-up visits as told by your health care provider. This is important. Where to find support Talking to others Friends and family members can be sources of support and guidance. Talk to trusted friends or family members about your condition. Explain your symptoms to them, and let them know that you are working with a health care provider to treat your depression. Tell friends and family members how they also can be helpful. Finances Find appropriate mental health providers that fit with your financial situation. Talk with your health care provider about options to get reduced prices on your medicines. Where to find more information You can find support in your area from: Anxiety and Depression Association of America (ADAA): www.adaa.org Mental Health America: www.mentalhealthamerica.net [employer] on Mental Illness: www.nami.org Contact a health care provider if: You stop taking your antidepressant medicines, and you have any of these symptoms: Nausea. Headache. Light-headedness. Chills and body aches. Not being able to sleep (insomnia). You or your friends and family think your depression is getting worse. Get help right away if: You have thoughts of hurting yourself or others. If you ever feel like you may hurt yourself or others, or have thoughts about taking your own life, get help right away. Go to your nearest emergency department or: Call your local emergency services (911 in the [place] Call a suicide crisis helpline, such as the [facility] at 640-843-2829. This is open 24 hours a day in the [place] Text the Crisis Text Line at (308)477-5819 (in the [place].). Summary If you are diagnosed with depression, preparing yourself to manage your symptoms is a good way to feel positive about your future. Work with your health care provider on a management plan that includes stress reduction techniques, medicines (if applicable), therapy, and  healthy lifestyle habits. Keep talking with your health care provider about how your treatment is working. If you have thoughts about taking your own life, call a suicide crisis helpline or text a crisis text line. This information is not intended to replace advice given to you by your health care provider. Make sure you discuss any questions you have with your health care provider. Document Revised: 11/21/2018 Document Reviewed: 11/21/2018 Elsevier Patient Education  2022 [place]. Generalized Anxiety Disorder, Adult Generalized anxiety disorder (GAD) is a mental health condition. Unlike normal worries, anxiety related to GAD is not triggered by a specific event. These worries do not fade or get better with time. GAD interferes with relationships, work, and school. GAD symptoms can vary from mild to severe. People with severe GAD can have intense waves of anxiety with physical symptoms that are similar to panic attacks. What are the causes? The exact cause of GAD is not known, but the following are believed to have an impact: Differences in natural brain chemicals. Genes passed down from parents to children. Differences in the way threats are perceived. Development during childhood. Personality. What increases the risk? The following factors may make you more likely to develop this condition: Being female. Having a family history of anxiety disorders. Being very shy. Experiencing very stressful life events, such as the death of a loved one. Having a very stressful family environment. What are the signs or symptoms? People with GAD often worry excessively about many things in their lives, such as their health and family.  Symptoms may also include: Mental and emotional symptoms: Worrying excessively about natural disasters. Fear of being late. Difficulty concentrating. Fears that others are judging your performance. Physical symptoms: Fatigue. Headaches, muscle tension, muscle  twitches, trembling, or feeling shaky. Feeling like your heart is pounding or beating very fast. Feeling out of breath or like you cannot take a deep breath. Having trouble falling asleep or staying asleep, or experiencing restlessness. Sweating. Nausea, diarrhea, or irritable bowel syndrome (IBS). Behavioral symptoms: Experiencing erratic moods or irritability. Avoidance of new situations. Avoidance of people. Extreme difficulty making decisions. How is this diagnosed? This condition is diagnosed based on your symptoms and medical history. You will also have a physical exam. Your health care provider may perform tests to rule out other possible causes of your symptoms. To be diagnosed with GAD, a person must have anxiety that: Is out of his or her control. Affects several different aspects of his or her life, such as work and relationships. Causes distress that makes him or her unable to take part in normal activities. Includes at least three symptoms of GAD, such as restlessness, fatigue, trouble concentrating, irritability, muscle tension, or sleep problems. Before your health care provider can confirm a diagnosis of GAD, these symptoms must be present more days than they are not, and they must last for 6 months or longer. How is this treated? This condition may be treated with: Medicine. Antidepressant medicine is usually prescribed for long-term daily control. Anti-anxiety medicines may be added in severe cases, especially when panic attacks occur. Talk therapy (psychotherapy). Certain types of talk therapy can be helpful in treating GAD by providing support, education, and guidance. Options include: Cognitive behavioral therapy (CBT). People learn coping skills and self-calming techniques to ease their physical symptoms. They learn to identify unrealistic thoughts and behaviors and to replace them with more appropriate thoughts and behaviors. Acceptance and commitment therapy (ACT).  This treatment teaches people how to be mindful as a way to cope with unwanted thoughts and feelings. Biofeedback. This process trains you to manage your body's response (physiological response) through breathing techniques and relaxation methods. You will work with a therapist while machines are used to monitor your physical symptoms. Stress management techniques. These include yoga, meditation, and exercise. A mental health specialist can help determine which treatment is best for you. Some people see improvement with one type of therapy. However, other people require a combination of therapies. Follow these instructions at home: Lifestyle Maintain a consistent routine and schedule. Anticipate stressful situations. Create a plan, and allow extra time to work with your plan. Practice stress management or self-calming techniques that you have learned from your therapist or your health care provider. General instructions Take over-the-counter and prescription medicines only as told by your health care provider. Understand that you are likely to have setbacks. Accept this and be kind to yourself as you persist to take better care of yourself. Recognize and accept your accomplishments, even if you judge them as small. Keep all follow-up visits as told by your health care provider. This is important. Contact a health care provider if: Your symptoms do not get better. Your symptoms get worse. You have signs of depression, such as: A persistently sad or irritable mood. Loss of enjoyment in activities that used to bring you joy. Change in weight or eating. Changes in sleeping habits. Avoiding friends or family members. Loss of energy for normal tasks. Feelings of guilt or worthlessness. Get help right away if: You have serious thoughts  about hurting yourself or others. If you ever feel like you may hurt yourself or others, or have thoughts about taking your own life, get help right away. Go to  your nearest emergency department or: Call your local emergency services (911 in the [place] Call a suicide crisis helpline, such as the [facility] at 916 130 5252. This is open 24 hours a day in the [place] Text the Crisis Text Line at 585-246-2658 (in the [place].). Summary Generalized anxiety disorder (GAD) is a mental health condition that involves worry that is not triggered by a specific event. People with GAD often worry excessively about many things in their lives, such as their health and family. GAD may cause symptoms such as restlessness, trouble concentrating, sleep problems, frequent sweating, nausea, diarrhea, headaches, and trembling or muscle twitching. A mental health specialist can help determine which treatment is best for you. Some people see improvement with one type of therapy. However, other people require a combination of therapies. This information is not intended to replace advice given to you by your health care provider. Make sure you discuss any questions you have with your health care provider. Document Revised: 10/31/2018 Document Reviewed: 10/31/2018 Elsevier Patient Education  [place]. Chronic Back Pain When back pain lasts longer than 3 months, it is called chronic back pain. Pain may get worse at certain times (flare-ups). There are things you can do at home to manage your pain. Follow these instructions at home: Pay attention to any changes in your symptoms. Take these actions to help with your pain: Managing pain and stiffness   If told, put ice on the painful area. Your doctor may tell you to use ice for 24-48 hours after the flare-up starts. To do this: Put ice in a plastic bag. Place a towel between your skin and the bag. Leave the ice on for 20 minutes, 2-3 times a day. If told, put heat on the painful area. Do this as often as told by your doctor. Use the heat source that your doctor recommends, such as a moist heat pack or a  heating pad. Place a towel between your skin and the heat source. Leave the heat on for 20-30 minutes. Take off the heat if your skin turns bright red. This is especially important if you are unable to feel pain, heat, or cold. You may have a greater risk of getting burned. Soak in a warm bath. This can help relieve pain. Activity  Avoid bending and other activities that make pain worse. When standing: Keep your upper back and neck straight. Keep your shoulders pulled back. Avoid slouching. When sitting: Keep your back straight. Relax your shoulders. Do not round your shoulders or pull them backward. Do not sit or stand in one place for long periods of time. Take short rest breaks during the day. Lying down or standing is usually better than sitting. Resting can help relieve pain. When sitting or lying down for a long time, do some mild activity or stretching. This will help to prevent stiffness and pain. Get regular exercise. Ask your doctor what activities are safe for you. Do not lift anything that is heavier than 10 lb (4.5 kg) or the limit that you are told, until your doctor says that it is safe. To prevent injury when you lift things: Bend your knees. Keep the weight close to your body. Avoid twisting. Sleep on a firm mattress. Try lying on your side with your knees slightly bent. If you  lie on your back, put a pillow under your knees. Medicines Treatment may include medicines for pain and swelling taken by mouth or put on the skin, prescription pain medicine, or muscle relaxants. Take over-the-counter and prescription medicines only as told by your doctor. Ask your doctor if the medicine prescribed to you: Requires you to avoid driving or using machinery. Can cause trouble pooping (constipation). You may need to take these actions to prevent or treat trouble pooping: Drink enough fluid to keep your pee (urine) pale yellow. Take over-the-counter or prescription medicines. Eat  foods that are high in fiber. These include beans, whole grains, and fresh fruits and vegetables. Limit foods that are high in fat and sugars. These include fried or sweet foods. General instructions Do not use any products that contain nicotine or tobacco, such as cigarettes, e-cigarettes, and chewing tobacco. If you need help quitting, ask your doctor. Keep all follow-up visits as told by your doctor. This is important. Contact a doctor if: Your pain does not get better with rest or medicine. Your pain gets worse, or you have new pain. You have a high fever. You lose weight very quickly. You have trouble doing your normal activities. Get help right away if: One or both of your legs or feet feel weak. One or both of your legs or feet lose feeling (have numbness). You have trouble controlling when you poop (have a bowel movement) or pee (urinate). You have bad back pain and: You feel like you may vomit (nauseous), or you vomit. You have pain in your belly (abdomen). You have shortness of breath. You faint. Summary When back pain lasts longer than 3 months, it is called chronic back pain. Pain may get worse at certain times (flare-ups). Use ice and heat as told by your doctor. Your doctor may tell you to use ice after flare-ups. This information is not intended to replace advice given to you by your health care provider. Make sure you discuss any questions you have with your health care provider. Document Revised: 02/20/2019 Document Reviewed: 02/20/2019 Elsevier Patient Education  2022 [place]. Hypertension, Adult Hypertension is another name for high blood pressure. High blood pressure forces your heart to work harder to pump blood. This can cause problems over time. There are two numbers in a blood pressure reading. There is a top number (systolic) over a bottom number (diastolic). It is best to have a blood pressure that is below 120/80. Healthy choices can help lower your blood  pressure, or you may need medicine to help lower it. What are the causes? The cause of this condition is not known. Some conditions may be related to high blood pressure. What increases the risk? Smoking. Having type 2 diabetes mellitus, high cholesterol, or both. Not getting enough exercise or physical activity. Being overweight. Having too much fat, sugar, calories, or salt (sodium) in your diet. Drinking too much alcohol. Having long-term (chronic) kidney disease. Having a family history of high blood pressure. Age. Risk increases with age. Race. You may be at higher risk if you are African American. Gender. Men are at higher risk than women before age 41. After age 4, women are at higher risk than men. Having obstructive sleep apnea. Stress. What are the signs or symptoms? High blood pressure may not cause symptoms. Very high blood pressure (hypertensive crisis) may cause: Headache. Feelings of worry or nervousness (anxiety). Shortness of breath. Nosebleed. A feeling of being sick to your stomach (nausea). Throwing up (vomiting). Changes  in how you see. Very bad chest pain. Seizures. How is this treated? This condition is treated by making healthy lifestyle changes, such as: Eating healthy foods. Exercising more. Drinking less alcohol. Your health care provider may prescribe medicine if lifestyle changes are not enough to get your blood pressure under control, and if: Your top number is above 130. Your bottom number is above 80. Your personal target blood pressure may vary. Follow these instructions at home: Eating and drinking  If told, follow the DASH eating plan. To follow this plan: Fill one half of your plate at each meal with fruits and vegetables. Fill one fourth of your plate at each meal with whole grains. Whole grains include whole-wheat pasta, brown rice, and whole-grain bread. Eat or drink low-fat dairy products, such as skim milk or low-fat yogurt. Fill  one fourth of your plate at each meal with low-fat (lean) proteins. Low-fat proteins include fish, chicken without skin, eggs, beans, and tofu. Avoid fatty meat, cured and processed meat, or chicken with skin. Avoid pre-made or processed food. Eat less than 1,500 mg of salt each day. Do not drink alcohol if: Your doctor tells you not to drink. You are pregnant, may be pregnant, or are planning to become pregnant. If you drink alcohol: Limit how much you use to: 0-1 drink a day for women. 0-2 drinks a day for men. Be aware of how much alcohol is in your drink. In the [place] one drink equals one 12 oz bottle of beer (355 mL), one 5 oz glass of wine (148 mL), or one 1 oz glass of hard liquor (44 mL). Lifestyle  Work with your doctor to stay at a healthy weight or to lose weight. Ask your doctor what the best weight is for you. Get at least 30 minutes of exercise most days of the week. This may include walking, swimming, or biking. Get at least 30 minutes of exercise that strengthens your muscles (resistance exercise) at least 3 days a week. This may include lifting weights or doing Pilates. Do not use any products that contain nicotine or tobacco, such as cigarettes, e-cigarettes, and chewing tobacco. If you need help quitting, ask your doctor. Check your blood pressure at home as told by your doctor. Keep all follow-up visits as told by your doctor. This is important. Medicines Take over-the-counter and prescription medicines only as told by your doctor. Follow directions carefully. Do not skip doses of blood pressure medicine. The medicine does not work as well if you skip doses. Skipping doses also puts you at risk for problems. Ask your doctor about side effects or reactions to medicines that you should watch for. Contact a doctor if you: Think you are having a reaction to the medicine you are taking. Have headaches that keep coming back (recurring). Feel dizzy. Have swelling in your  ankles. Have trouble with your vision. Get help right away if you: Get a very bad headache. Start to feel mixed up (confused). Feel weak or numb. Feel faint. Have very bad pain in your: Chest. Belly (abdomen). Throw up more than once. Have trouble breathing. Summary Hypertension is another name for high blood pressure. High blood pressure forces your heart to work harder to pump blood. For most people, a normal blood pressure is less than 120/80. Making healthy choices can help lower blood pressure. If your blood pressure does not get lower with healthy choices, you may need to take medicine. This information is not intended to replace advice given  to you by your health care provider. Make sure you discuss any questions you have with your health care provider. Document Revised: 09/20/2017 Document Reviewed: 09/20/2017 Elsevier Patient Education  [place].

## 2020-11-10 LAB — CBC WITH DIFFERENTIAL/PLATELET
Basophils Absolute: 0.1 10*3/uL (ref 0.0–0.2)
Basos: 1 %
EOS (ABSOLUTE): 0.6 10*3/uL — ABNORMAL HIGH (ref 0.0–0.4)
Eos: 9 %
Hematocrit: 41.2 % (ref 34.0–46.6)
Hemoglobin: 13.5 g/dL (ref 11.1–15.9)
Immature Grans (Abs): 0 10*3/uL (ref 0.0–0.1)
Immature Granulocytes: 0 %
Lymphocytes Absolute: 2.1 10*3/uL (ref 0.7–3.1)
Lymphs: 30 %
MCH: 28 pg (ref 26.6–33.0)
MCHC: 32.8 g/dL (ref 31.5–35.7)
MCV: 86 fL (ref 79–97)
Monocytes Absolute: 0.6 10*3/uL (ref 0.1–0.9)
Monocytes: 8 %
Neutrophils Absolute: 3.7 10*3/uL (ref 1.4–7.0)
Neutrophils: 52 %
Platelets: 337 10*3/uL (ref 150–450)
RBC: 4.82 x10E6/uL (ref 3.77–5.28)
RDW: 12.9 % (ref 11.7–15.4)
WBC: 7.1 10*3/uL (ref 3.4–10.8)

## 2020-11-10 LAB — COMPREHENSIVE METABOLIC PANEL
ALT: 34 IU/L — ABNORMAL HIGH (ref 0–32)
AST: 29 IU/L (ref 0–40)
Albumin/Globulin Ratio: 1.7 (ref 1.2–2.2)
Albumin: 4.5 g/dL (ref 3.8–4.8)
Alkaline Phosphatase: 143 IU/L — ABNORMAL HIGH (ref 44–121)
BUN/Creatinine Ratio: 25 (ref 12–28)
BUN: 23 mg/dL (ref 8–27)
Bilirubin Total: 0.3 mg/dL (ref 0.0–1.2)
CO2: 25 mmol/L (ref 20–29)
Calcium: 9.8 mg/dL (ref 8.7–10.3)
Chloride: 99 mmol/L (ref 96–106)
Creatinine, Ser: 0.93 mg/dL (ref 0.57–1.00)
Globulin, Total: 2.7 g/dL (ref 1.5–4.5)
Glucose: 99 mg/dL (ref 70–99)
Potassium: 3.9 mmol/L (ref 3.5–5.2)
Sodium: 141 mmol/L (ref 134–144)
Total Protein: 7.2 g/dL (ref 6.0–8.5)
eGFR: 70 mL/min/{1.73_m2} (ref >59)

## 2020-11-10 LAB — LIPID PANEL
Chol/HDL Ratio: 3.4 ratio (ref 0.0–4.4)
Cholesterol, Total: 222 mg/dL — ABNORMAL HIGH (ref 100–199)
HDL: 65 mg/dL (ref >39)
LDL Chol Calc (NIH): 125 mg/dL — ABNORMAL HIGH (ref 0–99)
Triglycerides: 183 mg/dL — ABNORMAL HIGH (ref 0–149)
VLDL Cholesterol Cal: 32 mg/dL (ref 5–40)

## 2020-11-14 LAB — TOXASSURE SELECT 13 (MW), URINE

## 2021-01-08 ENCOUNTER — Other Ambulatory Visit: Payer: Self-pay | Admitting: Nurse Practitioner

## 2021-01-08 ENCOUNTER — Encounter: Payer: Self-pay | Admitting: Nurse Practitioner

## 2021-01-08 DIAGNOSIS — F419 Anxiety disorder, unspecified: Secondary | ICD-10-CM

## 2021-01-08 DIAGNOSIS — M5030 Other cervical disc degeneration, unspecified cervical region: Secondary | ICD-10-CM

## 2021-01-11 ENCOUNTER — Telehealth: Payer: Self-pay | Admitting: Nurse Practitioner

## 2021-01-11 ENCOUNTER — Telehealth (INDEPENDENT_AMBULATORY_CARE_PROVIDER_SITE_OTHER): Payer: 59 | Admitting: Family

## 2021-01-11 ENCOUNTER — Encounter: Payer: Self-pay | Admitting: Family

## 2021-01-11 DIAGNOSIS — G4700 Insomnia, unspecified: Secondary | ICD-10-CM | POA: Diagnosis not present

## 2021-01-11 DIAGNOSIS — F419 Anxiety disorder, unspecified: Secondary | ICD-10-CM

## 2021-01-11 DIAGNOSIS — Z79899 Other long term (current) drug therapy: Secondary | ICD-10-CM

## 2021-01-11 MED ORDER — ALPRAZOLAM 0.5 MG PO TABS: 0.5000 mg | ORAL_TABLET | Freq: Two times a day (BID) | ORAL | 2 refills | Status: DC | PRN

## 2021-01-11 NOTE — Progress Notes (Signed)
Virtual Visit Consent   Cherye Myrle Sheng, you are scheduled for a virtual visit with a [facility] provider today.     Just as with appointments in the office, your consent must be obtained to participate.  Your consent will be active for this visit and any virtual visit you may have with one of our providers in the next 365 days.     If you have a MyChart account, a copy of this consent can be sent to you electronically.  All virtual visits are billed to your insurance company just like a traditional visit in the office.    As this is a virtual visit, video technology does not allow for your provider to perform a traditional examination.  This may limit your provider's ability to fully assess your condition.  If your provider identifies any concerns that need to be evaluated in person or the need to arrange testing (such as labs, EKG, etc.), we will make arrangements to do so.     Although advances in technology are sophisticated, we cannot ensure that it will always work on either your end or our end.  If the connection with a video visit is poor, the visit may have to be switched to a telephone visit.  With either a video or telephone visit, we are not always able to ensure that we have a secure connection.     I need to obtain your verbal consent now.   Are you willing to proceed with your visit today?    Devera T Landa has provided verbal consent on 01/11/2021 for a virtual visit (video or telephone).   Evelina Dun, FNP   Date: 01/11/2021 1:46 PM   Virtual Visit via Video Note   I, Evelina Dun, connected with  TRESHA MUZIO  ([id number], 1960/04/06) on 01/11/21 at  6:15 PM EST by a video-enabled telemedicine application and verified that I am speaking with the correct person using two identifiers.  Location: Patient: Virtual Visit Location Patient: Work  Provider: Scientist, research (medical) Provider: Office/Clinic   I discussed the limitations of evaluation and management by  telemedicine and the availability of in person appointments. The patient expressed understanding and agreed to proceed.    History of Present Illness: Marcellene EMMALOU HUNGER is a 60 y.o. who identifies as a female who was assigned female at birth, and is being seen today for GAD and refill on xanax. Her PCP is out of the office at this time.   HPI: Anxiety Presents for follow-up visit. Symptoms include depressed mood, excessive worry, insomnia, irritability and nervous/anxious behavior. Patient reports no palpitations, panic or restlessness. Symptoms occur occasionally. The severity of symptoms is moderate.    Insomnia Primary symptoms: difficulty falling asleep, frequent awakening.   The current episode started more than one year. The onset quality is gradual. The problem occurs intermittently.   Problems:  Patient Active Problem List   Diagnosis Date Noted   Insomnia 01/11/2021   Depression, recurrent ([place]) 07/24/2020   Controlled substance agreement signed 05/25/2020   Simple chronic bronchitis ([place]) 05/10/2019   Elevated LFTs 03/20/2019   Neuropathy 09/27/2016   DDD (degenerative disc disease), cervical 12/08/2015   History of lung cancer 12/08/2015   Cervical disc disorder with radiculopathy of cervical region 09/23/2015   Gastroesophageal reflux disease without esophagitis 03/19/2015   Menopausal vaginal dryness 02/17/2015   Constipation 02/17/2015   Esophageal dysphagia 09/25/2013   Hiccups 04/19/2013   Unspecified constipation 04/18/2013   Anxiety  Hypertension     Allergies:  Allergies  Allergen Reactions   Lisinopril Swelling    Of lips   Nitrofuran Derivatives    Medications:  Current Outpatient Medications:    ALPRAZolam (XANAX) 0.5 MG tablet, Take 1 tablet (0.5 mg total) by mouth 2 (two) times daily as needed for anxiety., Disp: 30 tablet, Rfl: 2   celecoxib (CELEBREX) 200 MG capsule, Take 1 capsule (200 mg total) by mouth daily., Disp: 60 capsule, Rfl: 3    diltiazem (CARDIZEM CD) 180 MG 24 hr capsule, TAKE 1 CAPSULE 2 TIMES A DAY FOR BLOOD PRESSURE, Disp: 60 capsule, Rfl: 5   escitalopram (LEXAPRO) 20 MG tablet, Take 1 tablet (20 mg total) by mouth daily., Disp: 30 tablet, Rfl: 5   hydrochlorothiazide (HYDRODIURIL) 25 MG tablet, Take 1 tablet (25 mg total) by mouth daily., Disp: 90 tablet, Rfl: 3   losartan (COZAAR) 100 MG tablet, Take 1 tablet (100 mg total) by mouth daily., Disp: 90 tablet, Rfl: 3   PROAIR HFA 108 (90 Base) MCG/ACT inhaler, 2 PUFFS EVERY 6 HOURS AS NEEDED FOR WHEEZING OR SHORTNESS OF BREATH, Disp: 8.5 g, Rfl: 1  Observations/Objective: Patient is well-developed, well-nourished in no acute distress.  Resting comfortably  at home.  Head is normocephalic, atraumatic.  No labored breathing.  Speech is clear and coherent with logical content.  Patient is alert and oriented at baseline.    Assessment and Plan: 1. Anxiety - ALPRAZolam (XANAX) 0.5 MG tablet; Take 1 tablet (0.5 mg total) by mouth 2 (two) times daily as needed for anxiety.  Dispense: 30 tablet; Refill: 2  2. Insomnia, unspecified type  3. Controlled substance agreement signed - ALPRAZolam (XANAX) 0.5 MG tablet; Take 1 tablet (0.5 mg total) by mouth 2 (two) times daily as needed for anxiety.  Dispense: 30 tablet; Refill: 2  Stress management  Patient reviewed in [place] controlled database, no flags noted. Contract and drug screen are up to date.  Keep follow up with PCP   Follow Up Instructions: I discussed the assessment and treatment plan with the patient. The patient was provided an opportunity to ask questions and all were answered. The patient agreed with the plan and demonstrated an understanding of the instructions.  A copy of instructions were sent to the patient via MyChart unless otherwise noted below.     The patient was advised to call back or seek an in-person evaluation if the symptoms worsen or if the condition fails to improve as  anticipated.  Time:  I spent 13 minutes with the patient via telehealth technology discussing the above problems/concerns.    Evelina Dun, FNP

## 2021-01-11 NOTE — Telephone Encounter (Signed)
Patient called stating she is out of her Xanax and Hydrocodone and needs to have it filled before end of year.  She understands the protocol of the Controlled Substance Agreement.  Her PCP is on vacation and will not be able to fill until she sees.  Back up provider, Evelina Dun, will do a video visit with patient today and give her enough until her PCP returns.  Patient understood and was very Patent attorney.

## 2021-01-13 ENCOUNTER — Other Ambulatory Visit: Payer: 59 | Admitting: Adult Health

## 2021-01-19 ENCOUNTER — Other Ambulatory Visit: Payer: Self-pay

## 2021-01-19 ENCOUNTER — Ambulatory Visit
Admission: RE | Admit: 2021-01-19 | Discharge: 2021-01-19 | Disposition: A | Discharge status: Home / Self Care | Payer: 59 | Source: Ambulatory Visit | Attending: Acute Care | Admitting: Acute Care

## 2021-01-19 DIAGNOSIS — Z87891 Personal history of nicotine dependence: Secondary | ICD-10-CM

## 2021-01-20 ENCOUNTER — Telehealth: Payer: Self-pay | Admitting: Acute Care

## 2021-01-20 ENCOUNTER — Telehealth: Payer: Self-pay | Admitting: Family Medicine

## 2021-01-20 ENCOUNTER — Telehealth: Payer: Self-pay | Admitting: Nurse Practitioner

## 2021-01-20 DIAGNOSIS — Z87891 Personal history of nicotine dependence: Secondary | ICD-10-CM

## 2021-01-20 NOTE — Telephone Encounter (Signed)
Patient aware she has called them but she is not there. This is a research study and she wants Je to review as well. She is aware Ardeen Fillers will work tomrrow.

## 2021-01-20 NOTE — Telephone Encounter (Signed)
Patient is in a cancer study research she has had lung Cancer. She is scared from [place] would like a call today for someone to look at. She s very nervous and scared. Covering pcp please advise.

## 2021-01-20 NOTE — Telephone Encounter (Signed)
Patient called requesting that a provider review her CT scan results that she had done yesterday at a [facility] facility and call her with the results. Pt says she has the results but she doesn't understand them.

## 2021-01-21 NOTE — Telephone Encounter (Signed)
Pt informed of CT results per Eric Form, NP.  PT verbalized understanding.  Copy of CT sent to PCP.  Order placed for 1 yr f/u CT.

## 2021-01-21 NOTE — Telephone Encounter (Signed)
Spoke to patient, who is requesting low dose CT results.  She would like to discuss results ASAP  Judson Roch, please advise. Thanks

## 2021-01-21 NOTE — Telephone Encounter (Signed)
Pt calling in this am for results.

## 2021-01-22 NOTE — Telephone Encounter (Signed)
Patient aware and verbalizes understanding. 

## 2021-02-03 NOTE — Telephone Encounter (Signed)
Will close encounter

## 2021-02-09 ENCOUNTER — Other Ambulatory Visit: Payer: Self-pay | Admitting: Nurse Practitioner

## 2021-02-09 DIAGNOSIS — I10 Essential (primary) hypertension: Secondary | ICD-10-CM

## 2021-02-24 ENCOUNTER — Other Ambulatory Visit: Payer: 59 | Admitting: Adult Health

## 2021-02-26 ENCOUNTER — Encounter: Payer: Self-pay | Admitting: Nurse Practitioner

## 2021-02-26 ENCOUNTER — Ambulatory Visit (INDEPENDENT_AMBULATORY_CARE_PROVIDER_SITE_OTHER): Payer: 59 | Admitting: Nurse Practitioner

## 2021-02-26 VITALS — BP 128/85 | HR 59 | Temp 98.4°F | Ht 64.0 in | Wt 141.0 lb

## 2021-02-26 DIAGNOSIS — M5030 Other cervical disc degeneration, unspecified cervical region: Secondary | ICD-10-CM

## 2021-02-26 DIAGNOSIS — F419 Anxiety disorder, unspecified: Secondary | ICD-10-CM

## 2021-02-26 DIAGNOSIS — R69 Illness, unspecified: Secondary | ICD-10-CM | POA: Diagnosis not present

## 2021-02-26 NOTE — Assessment & Plan Note (Signed)
Pain well controlled with current pain medication. RX refill sent to pharmacy

## 2021-02-26 NOTE — Patient Instructions (Signed)
Generalized Anxiety Disorder, Adult Generalized anxiety disorder (GAD) is a mental health condition. Unlike normal worries, anxiety related to GAD is not triggered by a specific event. These worries do not fade or get better with time. GAD interferes with relationships, work, and school. GAD symptoms can vary from mild to severe. People with severe GAD can have intense waves of anxiety with physical symptoms that are similar to panic attacks. What are the causes? The exact cause of GAD is not known, but the following are believed to have an impact: Differences in natural brain chemicals. Genes passed down from parents to children. Differences in the way threats are perceived. Development and stress during childhood. Personality. What increases the risk? The following factors may make you more likely to develop this condition: Being female. Having a family history of anxiety disorders. Being very shy. Experiencing very stressful life events, such as the death of a loved one. Having a very stressful family environment. What are the signs or symptoms? People with GAD often worry excessively about many things in their lives, such as their health and family. Symptoms may also include: Mental and emotional symptoms: Worrying excessively about natural disasters. Fear of being late. Difficulty concentrating. Fears that others are judging your performance. Physical symptoms: Fatigue. Headaches, muscle tension, muscle twitches, trembling, or feeling shaky. Feeling like your heart is pounding or beating very fast. Feeling out of breath or like you cannot take a deep breath. Having trouble falling asleep or staying asleep, or experiencing restlessness. Sweating. Nausea, diarrhea, or irritable bowel syndrome (IBS). Behavioral symptoms: Experiencing erratic moods or irritability. Avoidance of new situations. Avoidance of people. Extreme difficulty making decisions. How is this diagnosed? This  condition is diagnosed based on your symptoms and medical history. You will also have a physical exam. Your health care provider may perform tests to rule out other possible causes of your symptoms. To be diagnosed with GAD, a person must have anxiety that: Is out of his or her control. Affects several different aspects of his or her life, such as work and relationships. Causes distress that makes him or her unable to take part in normal activities. Includes at least three symptoms of GAD, such as restlessness, fatigue, trouble concentrating, irritability, muscle tension, or sleep problems. Before your health care provider can confirm a diagnosis of GAD, these symptoms must be present more days than they are not, and they must last for 6 months or longer. How is this treated? This condition may be treated with: Medicine. Antidepressant medicine is usually prescribed for long-term daily control. Anti-anxiety medicines may be added in severe cases, especially when panic attacks occur. Talk therapy (psychotherapy). Certain types of talk therapy can be helpful in treating GAD by providing support, education, and guidance. Options include: Cognitive behavioral therapy (CBT). People learn coping skills and self-calming techniques to ease their physical symptoms. They learn to identify unrealistic thoughts and behaviors and to replace them with more appropriate thoughts and behaviors. Acceptance and commitment therapy (ACT). This treatment teaches people how to be mindful as a way to cope with unwanted thoughts and feelings. Biofeedback. This process trains you to manage your body's response (physiological response) through breathing techniques and relaxation methods. You will work with a therapist while machines are used to monitor your physical symptoms. Stress management techniques. These include yoga, meditation, and exercise. A mental health specialist can help determine which treatment is best for you.  Some people see improvement with one type of therapy. However, other people require  a combination of therapies. Follow these instructions at home: Lifestyle Maintain a consistent routine and schedule. Anticipate stressful situations. Create a plan and allow extra time to work with your plan. Practice stress management or self-calming techniques that you have learned from your therapist or your health care provider. Exercise regularly and spend time outdoors. Eat a healthy diet that includes plenty of vegetables, fruits, whole grains, low-fat dairy products, and lean protein. Do not eat a lot of foods that are high in fat, added sugar, or salt (sodium). Drink plenty of water. Avoid alcohol. Alcohol can increase anxiety. Avoid caffeine and certain over-the-counter cold medicines. These may make you feel worse. Ask your pharmacist which medicines to avoid. General instructions Take over-the-counter and prescription medicines only as told by your health care provider. Understand that you are likely to have setbacks. Accept this and be kind to yourself as you persist to take better care of yourself. Anticipate stressful situations. Create a plan and allow extra time to work with your plan. Recognize and accept your accomplishments, even if you judge them as small. Spend time with people who care about you. Keep all follow-up visits. This is important. Where to find more information [place]: https://carter.com/ Substance Abuse and Mental Health Services: ktimeonline.com Contact a health care provider if: Your symptoms do not get better. Your symptoms get worse. You have signs of depression, such as: A persistently sad or irritable mood. Loss of enjoyment in activities that used to bring you joy. Change in weight or eating. Changes in sleeping habits. Get help right away if: You have thoughts about hurting yourself or others. If you ever feel like you may hurt  yourself or others, or have thoughts about taking your own life, get help right away. Go to your nearest emergency department or: Call your local emergency services (911 in the [place] Call a suicide crisis helpline, such as the [facility] at 843-846-2076 or 988 in the [place]. This is open 24 hours a day in the [place] Text the Crisis Text Line at 9564546678 (in the [place].). Summary Generalized anxiety disorder (GAD) is a mental health condition that involves worry that is not triggered by a specific event. People with GAD often worry excessively about many things in their lives, such as their health and family. GAD may cause symptoms such as restlessness, trouble concentrating, sleep problems, frequent sweating, nausea, diarrhea, headaches, and trembling or muscle twitching. A mental health specialist can help determine which treatment is best for you. Some people see improvement with one type of therapy. However, other people require a combination of therapies. This information is not intended to replace advice given to you by your health care provider. Make sure you discuss any questions you have with your health care provider. Document Revised: 08/05/2020 Document Reviewed: 05/03/2020 Elsevier Patient Education  [place]. Chronic Back Pain When back pain lasts longer than 3 months, it is called chronic back pain. The cause of your back pain may not be known. Some common causes include: Wear and tear (degenerative disease) of the bones, ligaments, or disks in your back. Inflammation and stiffness in your back (arthritis). People who have chronic back pain often go through certain periods in which the pain is more intense (flare-ups). Many people can learn to manage the pain with home care. Follow these instructions at home: Pay attention to any changes in your symptoms. Take these actions to help with your pain: Managing pain and stiffness  If directed, apply ice to  the painful area. Your health care provider may recommend applying ice during the first 24-48 hours after a flare-up begins. To do this: Put ice in a plastic bag. Place a towel between your skin and the bag. Leave the ice on for 20 minutes, 2-3 times per day. If directed, apply heat to the affected area as often as told by your health care provider. Use the heat source that your health care provider recommends, such as a moist heat pack or a heating pad. Place a towel between your skin and the heat source. Leave the heat on for 20-30 minutes. Remove the heat if your skin turns bright red. This is especially important if you are unable to feel pain, heat, or cold. You may have a greater risk of getting burned. Try soaking in a warm tub. Activity  Avoid bending and other activities that make the problem worse. Maintain a proper position when standing or sitting: When standing, keep your upper back and neck straight, with your shoulders pulled back. Avoid slouching. When sitting, keep your back straight and relax your shoulders. Do not round your shoulders or pull them backward. Do not sit or stand in one place for long periods of time. Take brief periods of rest throughout the day. This will reduce your pain. Resting in a lying or standing position is usually better than sitting to rest. When you are resting for longer periods, mix in some mild activity or stretching between periods of rest. This will help to prevent stiffness and pain. Get regular exercise. Ask your health care provider what activities are safe for you. Do not lift anything that is heavier than 10 lb (4.5 kg), or the limit that you are told, until your health care provider says that it is safe. Always use proper lifting technique, which includes: Bending your knees. Keeping the load close to your body. Avoiding twisting. Sleep on a firm mattress in a comfortable position. Try lying on your side with your knees slightly bent. If  you lie on your back, put a pillow under your knees. Medicines Treatment may include medicines for pain and inflammation taken by mouth or applied to the skin, prescription pain medicine, or muscle relaxants. Take over-the-counter and prescription medicines only as told by your health care provider. Ask your health care provider if the medicine prescribed to you: Requires you to avoid driving or using machinery. Can cause constipation. You may need to take these actions to prevent or treat constipation: Drink enough fluid to keep your urine pale yellow. Take over-the-counter or prescription medicines. Eat foods that are high in fiber, such as beans, whole grains, and fresh fruits and vegetables. Limit foods that are high in fat and processed sugars, such as fried or sweet foods. General instructions Do not use any products that contain nicotine or tobacco, such as cigarettes, e-cigarettes, and chewing tobacco. If you need help quitting, ask your health care provider. Keep all follow-up visits as told by your health care provider. This is important. Contact a health care provider if: You have pain that is not relieved with rest or medicine. Your pain gets worse, or you have new pain. You have a high fever. You have rapid weight loss. You have trouble doing your normal activities. Get help right away if: You have weakness or numbness in one or both of your legs or feet. You have trouble controlling your bladder or your bowels. You have severe back pain and have  any of the following: Nausea or vomiting. Pain in your abdomen. Shortness of breath or you faint. Summary Chronic back pain is back pain that lasts longer than 3 months. When a flare-up begins, apply ice to the painful area for the first 24-48 hours. Apply a moist heat pad or use a heating pad on the painful area as directed by your health care provider. When you are resting for longer periods, mix in some mild activity or  stretching between periods of rest. This will help to prevent stiffness and pain. This information is not intended to replace advice given to you by your health care provider. Make sure you discuss any questions you have with your health care provider. Document Revised: 02/20/2019 Document Reviewed: 02/20/2019 Elsevier Patient Education  2022 [employer].

## 2021-02-26 NOTE — Progress Notes (Signed)
Established Patient Office Visit  Subjective:  Patient ID: [name], female    DOB: Dec 01, 1960  Age: 61 y.o. MRN: [id number]  CC:  Chief Complaint  Patient presents with   Medication Management    Hydrocodone-acetaminophen refill    HPI [name] presents for  Back Pain: Patient presents for presents evaluation of low back problems.  Symptoms have been present for a few years and include  numbness and tingling . Initial inciting event: none. Symptoms are worst: morning, mid-day, afternoon. Alleviating factors identifiable by patient are walking. Exacerbating factors identifiable by patient are bending forwards and walking. Treatments so far initiated by patient:  hydrocodone  Previous lower back problems: none. Previous workup: none. Previous treatments:  hydrocodone .     Anxiety, Follow-up  She was last seen for anxiety 4 months ago. Changes made at last visit include: Xanax 0.5 mg tablet .   She reports good compliance with treatment. She reports good tolerance of treatment. She is not having side effects.   She feels her anxiety is mild and Improved since last visit.  Symptoms: No chest pain No difficulty concentrating  No dizziness No fatigue  No feelings of losing control No insomnia  No irritable No palpitations  No panic attacks No racing thoughts  No shortness of breath No sweating  No tremors/shakes    GAD-7 Results GAD-7 Generalized Anxiety Disorder Screening Tool 11/09/2020 07/24/2020 03/09/2020  1. Feeling Nervous, Anxious, or on Edge _0 2. Not Being Able to Stop or Control Worrying 0 1 0  3. Worrying Too Much About Different Things 0 0 0  4. Trouble Relaxing 0 3 1  5. Being So Restless it's Hard To Sit Still 0 1 0  6. Becoming Easily Annoyed or Irritable _1 7. Feeling Afraid As If Something Awful Might Happen 0 0 0  Total GAD-7 Score _2 Difficulty At Work, Home, or Getting  Along With Others? Not difficult at all Very difficult  Somewhat difficult    PHQ-9 Scores PHQ9 SCORE ONLY 11/09/2020 07/24/2020 03/09/2020  PHQ-9 Total Score 2 18 0      Past Medical History:  Diagnosis Date   Anxiety    Arthritis    Constipation 02/17/2015   Depression, recurrent ([place]) 07/24/2020   Emphysema lung ([facility])    Fibromyalgia    GERD (gastroesophageal reflux disease)    Hypertension    Lung cancer ([place]) 2012   pancoast tumor   Menopausal vaginal dryness 02/17/2015    Past Surgical History:  Procedure Laterality Date   CHOLECYSTECTOMY N/A 04/01/2016   Procedure: LAPAROSCOPIC CHOLECYSTECTOMY;  Surgeon: Aviva Signs, MD;  Location: [facility];  Service: General;  Laterality: N/A;   COLONOSCOPY  11/2010   normal colonoscopy from anal verge to ileocecal valve; no polyps, masses, AVMs, diverticula, or evidence of inflammation   ESOPHAGOGASTRODUODENOSCOPY N/A 10/16/2013   OZH:[id number] ring s/p dilation/2 cm HH. multiple 3 mm round and shallow erosions. 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 Hiatal hernia   LAPAROSCOPIC APPENDECTOMY N/A 06/21/2013   Procedure: APPENDECTOMY LAPAROSCOPIC;  Surgeon: Jamesetta So, MD;  Location: [facility];  Service: General;  Laterality: N/A;   MALONEY DILATION N/A 10/16/2013   Procedure: Keturah Shavers;  Surgeon: Daneil Dolin, MD;  [facility];  Service: Endoscopy;  Laterality: N/A;   PORTACATH PLACEMENT  [id number]   Rt  IJ  Port-A-Cath  dr.burney   STERNAL WIRES REMOVAL  01/23/2012   Procedure: STERNAL WIRES REMOVAL;  Surgeon: Grace Isaac, MD;  Location: [facility];  Service: Thoracic;  Laterality: N/A;   THORACOTOMY  [id number]   transsmanubrial anterolaterl thoracotomy with chest wall resection includinf the first rib and left  upper lobectomy for Pancoast turmor   TUBAL LIGATION      Family History  Problem Relation Age of Onset   Cancer Mother        lung   Hypertension Father    Stroke Father     Hypertension Sister    Heart disease Sister    Cancer Brother 53       pancreatic   Colon cancer Neg Hx     Social History   Socioeconomic History   Marital status: Married    Spouse name: Not on file   Number of children: Not on file   Years of education: Not on file   Highest education level: Not on file  Occupational History   Not on file  Tobacco Use   Smoking status: Former    Packs/day: 1.50    Years: 35.00    Pack years: 52.50    Types: Cigarettes    Quit date: 02/04/2010    Years since quitting: 11.0   Smokeless tobacco: Never   Tobacco comments:    Quit x 8 years  Vaping Use   Vaping Use: Never used  Substance and Sexual Activity   Alcohol use: Yes    Alcohol/week: 0.0 standard drinks    Comment: occasionally    Drug use: No   Sexual activity: Yes    Birth control/protection: Post-menopausal  Other Topics Concern   Not on file  Social History Narrative   Not on file   Social Determinants of Health   Financial Resource Strain: Not on file  Food Insecurity: Not on file  Transportation Needs: Not on file  Physical Activity: Not on file  Stress: Not on file  Social Connections: Not on file  Intimate Partner Violence: Not on file    Outpatient Medications Prior to Visit  Medication Sig Dispense Refill   ALPRAZolam (XANAX) 0.5 MG tablet Take 1 tablet (0.5 mg total) by mouth 2 (two) times daily as needed for anxiety. 30 tablet 2   celecoxib (CELEBREX) 200 MG capsule Take 1 capsule (200 mg total) by mouth daily. 60 capsule 3   diltiazem (CARDIZEM CD) 180 MG 24 hr capsule TAKE 1 CAPSULE 2 TIMES A DAY FOR BLOOD PRESSURE 60 capsule 5   escitalopram (LEXAPRO) 20 MG tablet TAKE 1 TABLET DAILY 30 tablet 5   hydrochlorothiazide (HYDRODIURIL) 25 MG tablet Take 1 tablet (25 mg total) by mouth daily. 90 tablet 3   HYDROcodone-acetaminophen (NORCO) 10-325 MG tablet Take 1 tablet by mouth every 8 (eight) hours as needed.     losartan (COZAAR) 100 MG tablet Take 1  tablet (100 mg total) by mouth daily. 90 tablet 3   PROAIR HFA 108 (90 Base) MCG/ACT inhaler 2 PUFFS EVERY 6 HOURS AS NEEDED FOR WHEEZING OR SHORTNESS OF BREATH 8.5 g 1   No facility-administered medications prior to visit.    Allergies  Allergen Reactions   Lisinopril Swelling    Of lips Of lips   Nitrofuran Derivatives     ROS Review of Systems  Constitutional: Negative.   HENT: Negative.    Eyes: Negative.   Respiratory: Negative.    Cardiovascular: Negative.   Gastrointestinal: Negative.  Musculoskeletal:  Positive for back pain.  Skin: Negative.  Negative for rash.  All other systems reviewed and are negative.    Objective:    Physical Exam Vitals reviewed.  Constitutional:      Appearance: Normal appearance.  HENT:     Head: Normocephalic.     Right Ear: External ear normal.     Left Ear: External ear normal.     Nose: Nose normal.     Mouth/Throat:     Mouth: Mucous membranes are moist.  Eyes:     Conjunctiva/sclera: Conjunctivae normal.  Cardiovascular:     Rate and Rhythm: Normal rate and regular rhythm.     Pulses: Normal pulses.     Heart sounds: Normal heart sounds.  Pulmonary:     Effort: Pulmonary effort is normal.     Breath sounds: Normal breath sounds.  Abdominal:     General: Bowel sounds are normal.  Musculoskeletal:     Lumbar back: Tenderness present. Decreased range of motion.  Skin:    General: Skin is warm.     Findings: No rash.  Neurological:     Mental Status: She is alert and oriented to person, place, and time.    BP 128/85    Pulse (!) 59    Temp 98.4 F (36.9 C)    Ht 5' 4" (1.626 m)    Wt 141 lb (64 kg)    SpO2 94%    BMI 24.20 kg/m  Wt Readings from Last 3 Encounters:  02/26/21 141 lb (64 kg)  09/11/20 139 lb (63 kg)  07/24/20 138 lb (62.6 kg)     Health Maintenance Due  Topic Date Due   COVID-19 Vaccine (1) Never done   HIV Screening  Never done   TETANUS/TDAP  Never done   MAMMOGRAM  12/17/2020    COLONOSCOPY (Pts 45-35yr Insurance coverage will need to be confirmed)  12/21/2020    There are no preventive care reminders to display for this patient.  Lab Results  Component Value Date   TSH 1.570 05/10/2019   Lab Results  Component Value Date   WBC 7.1 11/09/2020   HGB 13.5 11/09/2020   HCT 41.2 11/09/2020   MCV 86 11/09/2020   PLT 337 11/09/2020   Lab Results  Component Value Date   NA 141 11/09/2020   K 3.9 11/09/2020   CO2 25 11/09/2020   GLUCOSE 99 11/09/2020   BUN 23 11/09/2020   CREATININE 0.93 11/09/2020   BILITOT 0.3 11/09/2020   ALKPHOS 143 (H) 11/09/2020   AST 29 11/09/2020   ALT 34 (H) 11/09/2020   PROT 7.2 11/09/2020   ALBUMIN 4.5 11/09/2020   CALCIUM 9.8 11/09/2020   ANIONGAP 10 10/22/2019   EGFR 70 11/09/2020   Lab Results  Component Value Date   CHOL 222 (H) 11/09/2020   Lab Results  Component Value Date   HDL 65 11/09/2020   Lab Results  Component Value Date   LDLCALC 125 (H) 11/09/2020   Lab Results  Component Value Date   TRIG 183 (H) 11/09/2020   Lab Results  Component Value Date   CHOLHDL 3.4 11/09/2020   Lab Results  Component Value Date   HGBA1C 5.9 (H) 02/17/2015      Assessment & Plan:   Problem List Items Addressed This Visit       Musculoskeletal and Integument   DDD (degenerative disc disease), cervical - Primary    Pain well controlled with current pain medication. RX  refill sent to pharmacy        Relevant Medications   HYDROcodone-acetaminophen (NORCO) 10-325 MG tablet     Other   Anxiety    Anxiety well controlled with Xanax 0.5 mg tablet by mouth.        No orders of the defined types were placed in this encounter.   Follow-up: Return in about 3 months (around 05/26/2021).    Ivy Lynn, NP

## 2021-02-26 NOTE — Assessment & Plan Note (Addendum)
Anxiety well controlled with Xanax 0.5 mg tablet by mouth.

## 2021-03-04 ENCOUNTER — Other Ambulatory Visit: Payer: Self-pay | Admitting: Nurse Practitioner

## 2021-03-04 DIAGNOSIS — M5030 Other cervical disc degeneration, unspecified cervical region: Secondary | ICD-10-CM

## 2021-03-05 ENCOUNTER — Telehealth: Payer: Self-pay | Admitting: Nurse Practitioner

## 2021-03-08 NOTE — Telephone Encounter (Signed)
I spoke to pt and she is aware rx was sent and is picking it up today.

## 2021-03-09 ENCOUNTER — Other Ambulatory Visit: Payer: Self-pay | Admitting: Nurse Practitioner

## 2021-03-09 DIAGNOSIS — I10 Essential (primary) hypertension: Secondary | ICD-10-CM

## 2021-03-22 ENCOUNTER — Telehealth: Payer: Self-pay | Admitting: Nurse Practitioner

## 2021-03-22 DIAGNOSIS — K219 Gastro-esophageal reflux disease without esophagitis: Secondary | ICD-10-CM

## 2021-03-22 DIAGNOSIS — Z0000 Encounter for general adult medical examination without abnormal findings: Secondary | ICD-10-CM

## 2021-03-22 DIAGNOSIS — I10 Essential (primary) hypertension: Secondary | ICD-10-CM

## 2021-03-22 NOTE — Telephone Encounter (Signed)
What labs would you like?

## 2021-03-23 NOTE — Telephone Encounter (Signed)
Labs ordered for pt - pt aware

## 2021-04-26 ENCOUNTER — Encounter: Payer: Self-pay | Admitting: Nurse Practitioner

## 2021-04-26 ENCOUNTER — Ambulatory Visit (INDEPENDENT_AMBULATORY_CARE_PROVIDER_SITE_OTHER): Payer: 59 | Admitting: Nurse Practitioner

## 2021-04-26 VITALS — BP 111/63 | HR 75 | Temp 98.7°F | Ht 64.0 in | Wt 141.0 lb

## 2021-04-26 DIAGNOSIS — Z0000 Encounter for general adult medical examination without abnormal findings: Secondary | ICD-10-CM | POA: Insufficient documentation

## 2021-04-26 DIAGNOSIS — Z5181 Encounter for therapeutic drug level monitoring: Secondary | ICD-10-CM | POA: Insufficient documentation

## 2021-04-26 NOTE — Assessment & Plan Note (Signed)
Completed annual physical exam.  Patient has no new concerns.  Provided education on disease prevention and health maintenance.  Patient will schedule mammogram.  Follow-up in 1 year. ?

## 2021-04-26 NOTE — Progress Notes (Signed)
? ?Established Patient Office Visit ? ?Subjective:  ?Patient ID: [name], female    DOB: Oct 16, 1960  Age: 61 y.o. MRN: [id number] ? ?CC:  ?Chief Complaint  ?Patient presents with  ? Annual Exam  ?  No PAP  ? ? ?HPI ?[name] presents for .   ?Encounter for general adult medical examination without abnormal findings  ?Physical: Patient's last physical exam was 1 year ago .  ?Weight: Appropriate for height (BMI less than 27%) ;  ?Blood Pressure: Normal (BP less than 120/80) ;  ?Medical History: Patient history reviewed ; Family history reviewed ;  ?Allergies Reviewed: No change in current allergies ;  ?Medications Reviewed: Medications reviewed - no changes ;  ?Lipids: Normal lipid levels ; labs completed ?Smoking: Life-long non-smoker ;  ?Physical Activity: Exercises at least 3 times per week ; no ?Alcohol/Drug Use: Is a non-drinker ; No illicit drug use ;  ?Patient is not afflicted from Stress Incontinence and Urge Incontinence  ?Safety: reviewed ; Patient wears a seat belt, has smoke detectors, has carbon monoxide detectors, practices appropriate gun safety, and wears sunscreen with extended sun exposure. ?Dental Care: biannual cleanings, brushes and flosses daily. ?Ophthalmology/Optometry: Annual visit.  ?Hearing loss: none ?Vision impairments: none  ? ? ?[facility] Office Visit from 11/09/2020 in [facility]  ?PHQ-9 Total Score 2  ? ?  ?  ? ? ? ?Past Medical History:  ?Diagnosis Date  ? Anxiety   ? Arthritis   ? Constipation 02/17/2015  ? Depression, recurrent ([place]) 07/24/2020  ? Emphysema lung ([facility])   ? Fibromyalgia   ? GERD (gastroesophageal reflux disease)   ? Hypertension   ? Lung cancer ([place]) 2012  ? pancoast tumor  ? Menopausal vaginal dryness 02/17/2015  ? ? ?Past Surgical History:  ?Procedure Laterality Date  ? CHOLECYSTECTOMY N/A 04/01/2016  ? Procedure: LAPAROSCOPIC CHOLECYSTECTOMY;  Surgeon: Aviva Signs, MD;  Location: [facility];  Service: General;  Laterality: N/A;   ? COLONOSCOPY  11/2010  ? normal colonoscopy from anal verge to ileocecal valve; no polyps, masses, AVMs, diverticula, or evidence of inflammation  ? ESOPHAGOGASTRODUODENOSCOPY N/A 10/16/2013  ? [id number] ring s/p dilation/2 cm HH. multiple 3 mm round and shallow erosions. Negative H pylori  ? FOREIGN BODY REMOVAL N/A 09/08/2013  ? RMR:Mid esophageal stricture-likely radiation-induced/Schatzki's ring. Hiatal hernia  ? LAPAROSCOPIC APPENDECTOMY N/A 06/21/2013  ? Procedure: APPENDECTOMY LAPAROSCOPIC;  Surgeon: Jamesetta So, MD;  Location: [facility];  Service: General;  Laterality: N/A;  ? MALONEY DILATION N/A 10/16/2013  ? Procedure: MALONEY DILATION;  Surgeon: Daneil Dolin, MD;  [facility];  Service: Endoscopy;  Laterality: N/A;  ? PORTACATH PLACEMENT  [id number]  ? Rt   IJ  Port-A-Cath  dr.burney  ? STERNAL WIRES REMOVAL  01/23/2012  ? Procedure: STERNAL WIRES REMOVAL;  Surgeon: Grace Isaac, MD;  Location: [facility];  Service: Thoracic;  Laterality: N/A;  ? THORACOTOMY  [id number]  ? transsmanubrial anterolaterl thoracotomy with chest wall resection includinf the first rib and left  upper lobectomy for Pancoast turmor  ? TUBAL LIGATION    ? ? ?Family History  ?Problem Relation Age of Onset  ? Cancer Mother   ?     lung  ? Hypertension Father   ? Stroke Father   ? Hypertension Sister   ? Heart disease Sister   ? Cancer Brother 57  ?     pancreatic  ? Colon cancer Neg Hx   ? ? ?  Social History  ? ?Socioeconomic History  ? Marital status: Married  ?  Spouse name: Not on file  ? Number of children: Not on file  ? Years of education: Not on file  ? Highest education level: Not on file  ?Occupational History  ? Not on file  ?Tobacco Use  ? Smoking status: Former  ?  Packs/day: 1.50  ?  Years: 35.00  ?  Pack years: 52.50  ?  Types: Cigarettes  ?  Quit date: 02/04/2010  ?  Years since quitting: 11.2  ? Smokeless tobacco: Never  ? Tobacco comments:  ?  Quit x 8 years  ?Vaping Use  ? Vaping Use: Never used   ?Substance and Sexual Activity  ? Alcohol use: Yes  ?  Alcohol/week: 0.0 standard drinks  ?  Comment: occasionally   ? Drug use: No  ? Sexual activity: Yes  ?  Birth control/protection: Post-menopausal  ?Other Topics Concern  ? Not on file  ?Social History Narrative  ? Not on file  ? ?Social Determinants of Health  ? ?Financial Resource Strain: Not on file  ?Food Insecurity: Not on file  ?Transportation Needs: Not on file  ?Physical Activity: Not on file  ?Stress: Not on file  ?Social Connections: Not on file  ?Intimate Partner Violence: Not on file  ? ? ?Outpatient Medications Prior to Visit  ?Medication Sig Dispense Refill  ? ALPRAZolam (XANAX) 0.5 MG tablet Take 1 tablet (0.5 mg total) by mouth 2 (two) times daily as needed for anxiety. 30 tablet 2  ? escitalopram (LEXAPRO) 20 MG tablet TAKE 1 TABLET DAILY 30 tablet 5  ? hydrochlorothiazide (HYDRODIURIL) 25 MG tablet Take 1 tablet (25 mg total) by mouth daily. 90 tablet 3  ? HYDROcodone-acetaminophen (NORCO) 10-325 MG tablet TAKE 1 TABLET EVERY 8 HOURS AS NEEDED 40 tablet 0  ? losartan (COZAAR) 100 MG tablet Take 1 tablet (100 mg total) by mouth daily. 90 tablet 3  ? celecoxib (CELEBREX) 200 MG capsule Take 1 capsule (200 mg total) by mouth daily. 60 capsule 3  ? diltiazem (CARDIZEM CD) 180 MG 24 hr capsule TAKE 1 CAPSULE 2 TIMES A DAY FOR BLOOD PRESSURE 60 capsule 5  ? PROAIR HFA 108 (90 Base) MCG/ACT inhaler 2 PUFFS EVERY 6 HOURS AS NEEDED FOR WHEEZING OR SHORTNESS OF BREATH 8.5 g 1  ? ?No facility-administered medications prior to visit.  ? ? ?Allergies  ?Allergen Reactions  ? Lisinopril Swelling  ?  Of lips ?Of lips  ? Nitrofuran Derivatives   ? ? ?ROS ?Review of Systems  ?Constitutional: Negative.   ?HENT: Negative.    ?Eyes: Negative.   ?Respiratory: Negative.    ?Cardiovascular: Negative.   ?Gastrointestinal: Negative.   ?Endocrine: Negative.   ?Genitourinary: Negative.   ?Musculoskeletal: Negative.   ?Skin: Negative.  Negative for rash.   ?Psychiatric/Behavioral: Negative.    ?All other systems reviewed and are negative. ? ?  ?Objective:  ?  ?Physical Exam ?Vitals and nursing note reviewed.  ?Constitutional:   ?   Appearance: Normal appearance.  ?HENT:  ?   Head: Normocephalic.  ?   Right Ear: External ear normal.  ?   Left Ear: External ear normal.  ?   Nose: Nose normal.  ?   Mouth/Throat:  ?   Mouth: Mucous membranes are moist.  ?   Pharynx: Oropharynx is clear.  ?Eyes:  ?   Conjunctiva/sclera: Conjunctivae normal.  ?Cardiovascular:  ?   Rate and Rhythm: Normal rate and regular  rhythm.  ?   Pulses: Normal pulses.  ?   Heart sounds: Normal heart sounds.  ?Pulmonary:  ?   Effort: Pulmonary effort is normal.  ?   Breath sounds: Normal breath sounds.  ?Abdominal:  ?   General: Bowel sounds are normal.  ?Skin: ?   General: Skin is warm.  ?   Findings: No rash.  ?Neurological:  ?   General: No focal deficit present.  ?   Mental Status: She is alert and oriented to person, place, and time.  ?Psychiatric:     ?   Behavior: Behavior normal.  ? ? ?BP 111/63   Pulse 75   Temp 98.7 ?F (37.1 ?C)   Ht 5\' 4"  (1.626 m)   Wt 141 lb (64 kg)   SpO2 93%   BMI 24.20 kg/m?  ?Wt Readings from Last 3 Encounters:  ?04/26/21 141 lb (64 kg)  ?02/26/21 141 lb (64 kg)  ?09/11/20 139 lb (63 kg)  ? ? ? ?Health Maintenance Due  ?Topic Date Due  ? COVID-19 Vaccine (1) Never done  ? HIV Screening  Never done  ? TETANUS/TDAP  Never done  ? MAMMOGRAM  12/17/2020  ? COLONOSCOPY (Pts 45-77yrs Insurance coverage will need to be confirmed)  12/21/2020  ? ? ?There are no preventive care reminders to display for this patient. ? ?Lab Results  ?Component Value Date  ? TSH 1.570 05/10/2019  ? ?Lab Results  ?Component Value Date  ? WBC 7.1 11/09/2020  ? HGB 13.5 11/09/2020  ? HCT 41.2 11/09/2020  ? MCV 86 11/09/2020  ? PLT 337 11/09/2020  ? ?Lab Results  ?Component Value Date  ? NA 141 11/09/2020  ? K 3.9 11/09/2020  ? CO2 25 11/09/2020  ? GLUCOSE 99 11/09/2020  ? BUN 23 11/09/2020  ?  CREATININE 0.93 11/09/2020  ? BILITOT 0.3 11/09/2020  ? ALKPHOS 143 (H) 11/09/2020  ? AST 29 11/09/2020  ? ALT 34 (H) 11/09/2020  ? PROT 7.2 11/09/2020  ? ALBUMIN 4.5 11/09/2020  ? CALCIUM 9.8 11/09/2020  ? ANIONGAP 10 10/22/2019  ?

## 2021-04-26 NOTE — Patient Instructions (Signed)

## 2021-04-27 LAB — CBC WITH DIFFERENTIAL/PLATELET
Basophils Absolute: 0.1 10*3/uL (ref 0.0–0.2)
Basos: 2 %
EOS (ABSOLUTE): 0.6 10*3/uL — ABNORMAL HIGH (ref 0.0–0.4)
Eos: 9 %
Hematocrit: 35.3 % (ref 34.0–46.6)
Hemoglobin: 12.1 g/dL (ref 11.1–15.9)
Immature Grans (Abs): 0 10*3/uL (ref 0.0–0.1)
Immature Granulocytes: 0 %
Lymphocytes Absolute: 2.1 10*3/uL (ref 0.7–3.1)
Lymphs: 31 %
MCH: 28.9 pg (ref 26.6–33.0)
MCHC: 34.3 g/dL (ref 31.5–35.7)
MCV: 84 fL (ref 79–97)
Monocytes Absolute: 0.6 10*3/uL (ref 0.1–0.9)
Monocytes: 9 %
Neutrophils Absolute: 3.3 10*3/uL (ref 1.4–7.0)
Neutrophils: 49 %
Platelets: 327 10*3/uL (ref 150–450)
RBC: 4.18 x10E6/uL (ref 3.77–5.28)
RDW: 12.4 % (ref 11.7–15.4)
WBC: 6.8 10*3/uL (ref 3.4–10.8)

## 2021-04-27 LAB — COMPREHENSIVE METABOLIC PANEL
ALT: 39 IU/L — ABNORMAL HIGH (ref 0–32)
AST: 27 IU/L (ref 0–40)
Albumin/Globulin Ratio: 1.6 (ref 1.2–2.2)
Albumin: 4.2 g/dL (ref 3.8–4.8)
Alkaline Phosphatase: 189 IU/L — ABNORMAL HIGH (ref 44–121)
BUN/Creatinine Ratio: 25 (ref 12–28)
BUN: 23 mg/dL (ref 8–27)
Bilirubin Total: 0.3 mg/dL (ref 0.0–1.2)
CO2: 26 mmol/L (ref 20–29)
Calcium: 9.4 mg/dL (ref 8.7–10.3)
Chloride: 103 mmol/L (ref 96–106)
Creatinine, Ser: 0.93 mg/dL (ref 0.57–1.00)
Globulin, Total: 2.6 g/dL (ref 1.5–4.5)
Glucose: 105 mg/dL — ABNORMAL HIGH (ref 70–99)
Potassium: 3.4 mmol/L — ABNORMAL LOW (ref 3.5–5.2)
Sodium: 143 mmol/L (ref 134–144)
Total Protein: 6.8 g/dL (ref 6.0–8.5)
eGFR: 70 mL/min/{1.73_m2} (ref >59)

## 2021-04-27 LAB — LIPID PANEL
Chol/HDL Ratio: 3.1 ratio (ref 0.0–4.4)
Cholesterol, Total: 211 mg/dL — ABNORMAL HIGH (ref 100–199)
HDL: 68 mg/dL (ref >39)
LDL Chol Calc (NIH): 117 mg/dL — ABNORMAL HIGH (ref 0–99)
Triglycerides: 148 mg/dL (ref 0–149)
VLDL Cholesterol Cal: 26 mg/dL (ref 5–40)

## 2021-04-28 ENCOUNTER — Other Ambulatory Visit: Payer: Self-pay | Admitting: Nurse Practitioner

## 2021-04-28 DIAGNOSIS — E876 Hypokalemia: Secondary | ICD-10-CM

## 2021-06-01 ENCOUNTER — Encounter: Payer: Self-pay | Admitting: Nurse Practitioner

## 2021-06-02 ENCOUNTER — Ambulatory Visit: Payer: 59 | Admitting: Nurse Practitioner

## 2021-06-02 ENCOUNTER — Encounter: Payer: Self-pay | Admitting: Nurse Practitioner

## 2021-06-02 ENCOUNTER — Other Ambulatory Visit: Payer: Self-pay | Admitting: Nurse Practitioner

## 2021-06-02 DIAGNOSIS — M5030 Other cervical disc degeneration, unspecified cervical region: Secondary | ICD-10-CM

## 2021-06-02 NOTE — Progress Notes (Signed)
? ?Acute Office Visit ? ?Subjective:  ? ?  ?Patient ID: [name], female    DOB: 05-21-1960, 61 y.o.   MRN: [id number] ? ?Chief Complaint  ?Patient presents with  ? Back Pain  ?  Pain referral   ? Medical Management of Chronic Issues  ? ? ?Back Pain ?Associated symptoms include tingling.  ?Patient is in today for Back Pain ? ?She reports chronic back pain. There was not an injury that may have caused the pain. The most recent episode started  few years ago and is staying constant. The pain is located in the right lumbar area or left lumbar areato both thighs. It is described as aching, is 7/10 in intensity, occurring constantly. Symptoms are worse in the: morning, mid-day, afternoon  ?Aggravating factors: walking Relieving factors: none.  ?She has tried application of heat, NSAIDs, and prescription pain relievers with no relief.  ? ?Associated symptoms: ?No abdominal pain No bowel incontinence  ?No chest pain No dysuria   ?No fever No headaches  ?No joint pains No pelvic pain  ?No weakness in leg ? Yes tingling in lower extremities  ?No urinary incontinence No weight loss  ? ? ?-----------------------------------------------------------------------------------------  ? ?Review of Systems  ?Constitutional: Negative.   ?HENT: Negative.    ?Eyes: Negative.   ?Respiratory: Negative.    ?Cardiovascular: Negative.   ?Musculoskeletal:  Positive for back pain.  ?Skin: Negative.   ?Neurological:  Positive for tingling.  ?All other systems reviewed and are negative. ? ? ?   ?Objective:  ?  ?BP 133/79 (BP Location: Right Arm, Patient Position: Sitting, Cuff Size: Small)   Pulse 60   Temp 98.6 ?F (37 ?C) (Oral)   Ht 5\' 4"  (1.626 m)   Wt 141 lb 9.6 oz (64.2 kg)   SpO2 95%   BMI 24.31 kg/m?  ?BP Readings from Last 3 Encounters:  ?06/02/21 133/79  ?04/26/21 111/63  ?02/26/21 128/85  ? ?Wt Readings from Last 3 Encounters:  ?06/02/21 141 lb 9.6 oz (64.2 kg)  ?04/26/21 141 lb (64 kg)  ?02/26/21 141 lb (64 kg)  ? ?   ? ?Physical Exam ?Vitals and nursing note reviewed.  ?Constitutional:   ?   Appearance: Normal appearance.  ?HENT:  ?   Head: Normocephalic.  ?   Right Ear: External ear normal.  ?   Left Ear: External ear normal.  ?   Nose: Nose normal.  ?   Mouth/Throat:  ?   Pharynx: Oropharynx is clear.  ?Eyes:  ?   Conjunctiva/sclera: Conjunctivae normal.  ?Cardiovascular:  ?   Rate and Rhythm: Normal rate and regular rhythm.  ?   Pulses: Normal pulses.  ?   Heart sounds: Normal heart sounds.  ?Pulmonary:  ?   Breath sounds: Normal breath sounds.  ?Abdominal:  ?   General: Bowel sounds are normal.  ?Musculoskeletal:  ?   Lumbar back: Tenderness present. Decreased range of motion.  ?     Back: ? ?   Comments: Lower back pain ?  ?Skin: ?   General: Skin is warm.  ?   Findings: No rash.  ?Neurological:  ?   Mental Status: She is alert.  ? ? ?No results found for any visits on 06/02/21. ? ? ?   ?Assessment & Plan:  ? ?Problem List Items Addressed This Visit   ? ?  ? Musculoskeletal and Integument  ? DDD (degenerative disc disease), cervical  ?  Referral completed to neurosurgery for unresolved degenerative disc  disease pain.  Continue hydrocodone 10-325 mg tablet as needed every 8 hours.  Heating pad, back strengthening exercises.  Follow-up with worsening unresolved symptoms. ? ?  ?  ? ? ?No orders of the defined types were placed in this encounter. ? ? ?No follow-ups on file. ? ?Ivy Lynn, NP ? ? ?

## 2021-06-02 NOTE — Telephone Encounter (Signed)
Coming in today

## 2021-06-02 NOTE — Telephone Encounter (Signed)
Referral completed to neurology.   ? ?I don't see gabapentin on your medication list, have you ever been treated for Neuropathy? If treatment failed then neurology will ne next. But I put in your neurology order.  ?

## 2021-06-02 NOTE — Assessment & Plan Note (Signed)
Referral completed to neurosurgery for unresolved degenerative disc disease pain.  Continue hydrocodone 10-325 mg tablet as needed every 8 hours.  Heating pad, back strengthening exercises.  Follow-up with worsening unresolved symptoms. ?

## 2021-06-04 ENCOUNTER — Other Ambulatory Visit: Payer: Self-pay | Admitting: Nurse Practitioner

## 2021-06-04 ENCOUNTER — Other Ambulatory Visit: Payer: Self-pay | Admitting: Family

## 2021-06-04 DIAGNOSIS — M5030 Other cervical disc degeneration, unspecified cervical region: Secondary | ICD-10-CM

## 2021-06-04 DIAGNOSIS — F419 Anxiety disorder, unspecified: Secondary | ICD-10-CM

## 2021-06-04 DIAGNOSIS — Z79899 Other long term (current) drug therapy: Secondary | ICD-10-CM

## 2021-06-04 NOTE — Telephone Encounter (Signed)
Last seen 06/02/21 ?

## 2021-06-07 ENCOUNTER — Other Ambulatory Visit: Payer: Self-pay | Admitting: Nurse Practitioner

## 2021-06-07 DIAGNOSIS — M5030 Other cervical disc degeneration, unspecified cervical region: Secondary | ICD-10-CM

## 2021-07-02 DIAGNOSIS — M47818 Spondylosis without myelopathy or radiculopathy, sacral and sacrococcygeal region: Secondary | ICD-10-CM | POA: Diagnosis not present

## 2021-07-02 DIAGNOSIS — M5416 Radiculopathy, lumbar region: Secondary | ICD-10-CM | POA: Diagnosis not present

## 2021-07-13 DIAGNOSIS — M5416 Radiculopathy, lumbar region: Secondary | ICD-10-CM | POA: Diagnosis not present

## 2021-07-13 DIAGNOSIS — M545 Low back pain, unspecified: Secondary | ICD-10-CM | POA: Diagnosis not present

## 2021-08-03 ENCOUNTER — Other Ambulatory Visit: Payer: Self-pay | Admitting: Nurse Practitioner

## 2021-08-03 DIAGNOSIS — I10 Essential (primary) hypertension: Secondary | ICD-10-CM

## 2021-08-09 DIAGNOSIS — M5137 Other intervertebral disc degeneration, lumbosacral region: Secondary | ICD-10-CM | POA: Diagnosis not present

## 2021-08-26 DIAGNOSIS — M533 Sacrococcygeal disorders, not elsewhere classified: Secondary | ICD-10-CM | POA: Diagnosis not present

## 2021-09-01 ENCOUNTER — Other Ambulatory Visit: Payer: Self-pay | Admitting: Nurse Practitioner

## 2021-09-01 DIAGNOSIS — I10 Essential (primary) hypertension: Secondary | ICD-10-CM

## 2021-09-18 DIAGNOSIS — R32 Unspecified urinary incontinence: Secondary | ICD-10-CM | POA: Diagnosis not present

## 2021-09-18 DIAGNOSIS — Z801 Family history of malignant neoplasm of trachea, bronchus and lung: Secondary | ICD-10-CM | POA: Diagnosis not present

## 2021-09-18 DIAGNOSIS — I10 Essential (primary) hypertension: Secondary | ICD-10-CM | POA: Diagnosis not present

## 2021-09-18 DIAGNOSIS — R69 Illness, unspecified: Secondary | ICD-10-CM | POA: Diagnosis not present

## 2021-09-18 DIAGNOSIS — Z800 Family history of malignant neoplasm of digestive organs: Secondary | ICD-10-CM | POA: Diagnosis not present

## 2021-09-18 DIAGNOSIS — Z8249 Family history of ischemic heart disease and other diseases of the circulatory system: Secondary | ICD-10-CM | POA: Diagnosis not present

## 2021-09-18 DIAGNOSIS — Z85118 Personal history of other malignant neoplasm of bronchus and lung: Secondary | ICD-10-CM | POA: Diagnosis not present

## 2021-09-18 DIAGNOSIS — Z87891 Personal history of nicotine dependence: Secondary | ICD-10-CM | POA: Diagnosis not present

## 2021-09-18 DIAGNOSIS — M545 Low back pain, unspecified: Secondary | ICD-10-CM | POA: Diagnosis not present

## 2021-09-18 DIAGNOSIS — Z888 Allergy status to other drugs, medicaments and biological substances status: Secondary | ICD-10-CM | POA: Diagnosis not present

## 2021-09-28 ENCOUNTER — Encounter: Payer: Self-pay | Admitting: Nurse Practitioner

## 2021-09-28 ENCOUNTER — Ambulatory Visit: Payer: 59 | Admitting: Nurse Practitioner

## 2021-09-28 VITALS — BP 117/72 | HR 65 | Temp 97.9°F | Ht 64.0 in | Wt 140.2 lb

## 2021-09-28 DIAGNOSIS — I10 Essential (primary) hypertension: Secondary | ICD-10-CM | POA: Diagnosis not present

## 2021-09-28 DIAGNOSIS — Z1211 Encounter for screening for malignant neoplasm of colon: Secondary | ICD-10-CM

## 2021-09-28 DIAGNOSIS — R69 Illness, unspecified: Secondary | ICD-10-CM | POA: Diagnosis not present

## 2021-09-28 DIAGNOSIS — F339 Major depressive disorder, recurrent, unspecified: Secondary | ICD-10-CM

## 2021-09-28 DIAGNOSIS — Z0283 Encounter for blood-alcohol and blood-drug test: Secondary | ICD-10-CM

## 2021-09-28 DIAGNOSIS — F419 Anxiety disorder, unspecified: Secondary | ICD-10-CM

## 2021-09-28 NOTE — Progress Notes (Signed)
Established Patient Office Visit  Subjective   Patient ID: [name], female    DOB: July 08, 1960  Age: 61 y.o. MRN: [id number]  Chief Complaint  Patient presents with   Medical Management of Chronic Issues   Hypertension    Hypertension This is a chronic problem. The current episode started more than 1 year ago. The problem has been gradually improving since onset. The problem is controlled. Associated symptoms include anxiety. Pertinent negatives include no chest pain or headaches. Risk factors for coronary artery disease include post-menopausal state. Past treatments include calcium channel blockers. The current treatment provides significant improvement. There are no compliance problems.    Anxiety, Follow-up  She was last seen for anxiety 6 months ago. Changes made at last visit include Xanax 0.5 mg tablet by mouth..   She reports good compliance with treatment. She reports good tolerance of treatment. She is not having side effects.   She feels her anxiety is mild and Improved since last visit.  Symptoms: No chest pain No difficulty concentrating  No dizziness No fatigue  Yes feelings of losing control No insomnia  Yes irritable No palpitations  No panic attacks No racing thoughts  No shortness of breath No sweating  No tremors/shakes    GAD-7 Results    09/28/2021    9:23 AM 06/02/2021   12:13 PM 11/09/2020    8:35 AM  GAD-7 Generalized Anxiety Disorder Screening Tool  1. Feeling Nervous, Anxious, or on Edge 1 0 1  2. Not Being Able to Stop or Control Worrying 0 0 0  3. Worrying Too Much About Different Things 0 0 0  4. Trouble Relaxing 1 1 0  5. Being So Restless it's Hard To Sit Still 1 1 0  6. Becoming Easily Annoyed or Irritable _0 7. Feeling Afraid As If Something Awful Might Happen 0 0 0  Total GAD-7 Score _1 Difficulty At Work, Home, or Getting  Along With Others? Not difficult at all Somewhat difficult Not difficult at all    PHQ-9  Scores    09/28/2021    9:23 AM 06/02/2021   12:12 PM 11/09/2020    8:36 AM  PHQ9 SCORE ONLY  PHQ-9 Total Score _2 Anxiety, Follow-up  She was last seen for anxiety 6 months ago. Changes made at last visit include Xanax 0.5 mg tablet by mouth daily..   She reports good compliance with treatment. She reports good tolerance of treatment. She is not having side effects.   She feels her anxiety is mild and Improved since last visit.  Symptoms: No chest pain No difficulty concentrating  No dizziness No fatigue  Yes feelings of losing control No insomnia  Yes irritable No palpitations  No panic attacks No racing thoughts  No shortness of breath No sweating  No tremors/shakes    GAD-7 Results    09/28/2021    9:23 AM 06/02/2021   12:13 PM 11/09/2020    8:35 AM  GAD-7 Generalized Anxiety Disorder Screening Tool  1. Feeling Nervous, Anxious, or on Edge 1 0 1  2. Not Being Able to Stop or Control Worrying 0 0 0  3. Worrying Too Much About Different Things 0 0 0  4. Trouble Relaxing 1 1 0  5. Being So Restless it's Hard To Sit Still 1 1 0  6. Becoming Easily Annoyed or Irritable _3 7. Feeling Afraid As If Something Awful Might Happen  0 0 0  Total GAD-7 Score _0 Difficulty At Work, Home, or Getting  Along With Others? Not difficult at all Somewhat difficult Not difficult at all    PHQ-9 Scores    09/28/2021    9:23 AM 06/02/2021   12:12 PM 11/09/2020    8:36 AM  PHQ9 SCORE ONLY  PHQ-9 Total Score _1 Patient Active Problem List   Diagnosis Date Noted   Annual physical exam 04/26/2021   Insomnia 01/11/2021   Depression, recurrent ([place]) 07/24/2020   Controlled substance agreement signed 05/25/2020   Simple chronic bronchitis ([place]) 05/10/2019   Elevated LFTs 03/20/2019   Neuropathy 09/27/2016   DDD (degenerative disc disease), cervical 12/08/2015   History of lung cancer 12/08/2015   Cervical disc disorder with radiculopathy of cervical region  09/23/2015   Gastroesophageal reflux disease without esophagitis 03/19/2015   Menopausal vaginal dryness 02/17/2015   Constipation 02/17/2015   Esophageal dysphagia 09/25/2013   Hiccups 04/19/2013   Unspecified constipation 04/18/2013   Anxiety    Hypertension    Past Medical History:  Diagnosis Date   Anxiety    Arthritis    Constipation 02/17/2015   Depression, recurrent ([place]) 07/24/2020   Emphysema lung ([facility])    Fibromyalgia    GERD (gastroesophageal reflux disease)    Hypertension    Lung cancer ([place]) 2012   pancoast tumor   Menopausal vaginal dryness 02/17/2015   Past Surgical History:  Procedure Laterality Date   CHOLECYSTECTOMY N/A 04/01/2016   Procedure: LAPAROSCOPIC CHOLECYSTECTOMY;  Surgeon: Aviva Signs, MD;  Location: [facility];  Service: General;  Laterality: N/A;   COLONOSCOPY  11/2010   normal colonoscopy from anal verge to ileocecal valve; no polyps, masses, AVMs, diverticula, or evidence of inflammation   ESOPHAGOGASTRODUODENOSCOPY N/A 10/16/2013   NWG:NFAOZHYQ ring s/p dilation/2 cm HH. multiple 3 mm round and shallow erosions. 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 Hiatal hernia   LAPAROSCOPIC APPENDECTOMY N/A 06/21/2013   Procedure: APPENDECTOMY LAPAROSCOPIC;  Surgeon: Jamesetta So, MD;  Location: [facility];  Service: General;  Laterality: N/A;   MALONEY DILATION N/A 10/16/2013   Procedure: Keturah Shavers;  Surgeon: Daneil Dolin, MD;  Location: [facility];  Service: Endoscopy;  Laterality: N/ASol Passer PLACEMENT  [id number]   Rt   IJ  Port-A-Cath  [email or web address]   STERNAL WIRES REMOVAL  01/23/2012   Procedure: STERNAL WIRES REMOVAL;  Surgeon: Grace Isaac, MD;  Location: [facility];  Service: Thoracic;  Laterality: N/A;   THORACOTOMY  [id number]   transsmanubrial anterolaterl thoracotomy with chest wall resection includinf the first rib and left  upper lobectomy for Pancoast  turmor   TUBAL LIGATION     Social History   Tobacco Use   Smoking status: Former    Packs/day: 1.50    Years: 35.00    Total pack years: 52.50    Types: Cigarettes    Quit date: 02/04/2010    Years since quitting: 11.6   Smokeless tobacco: Never   Tobacco comments:    Quit x 8 years  Vaping Use   Vaping Use: Never used  Substance Use Topics   Alcohol use: Yes    Alcohol/week: 0.0 standard drinks of alcohol    Comment: occasionally    Drug use: No   Social History   Socioeconomic History   Marital status: Married    Spouse name: Not on  file   Number of children: Not on file   Years of education: Not on file   Highest education level: Not on file  Occupational History   Not on file  Tobacco Use   Smoking status: Former    Packs/day: 1.50    Years: 35.00    Total pack years: 52.50    Types: Cigarettes    Quit date: 02/04/2010    Years since quitting: 11.6   Smokeless tobacco: Never   Tobacco comments:    Quit x 8 years  Vaping Use   Vaping Use: Never used  Substance and Sexual Activity   Alcohol use: Yes    Alcohol/week: 0.0 standard drinks of alcohol    Comment: occasionally    Drug use: No   Sexual activity: Yes    Birth control/protection: Post-menopausal  Other Topics Concern   Not on file  Social History Narrative   Not on file   Social Determinants of Health   Financial Resource Strain: Not on file  Food Insecurity: Not on file  Transportation Needs: Not on file  Physical Activity: Not on file  Stress: Not on file  Social Connections: Not on file  Intimate Partner Violence: Not on file   Family Status  Relation Name Status   Mother  Deceased   Father  Deceased   Sister  Deceased   Brother  Deceased   Daughter  Alive   Son  Alive   Neg Hx  (Not Specified)   Family History  Problem Relation Age of Onset   Cancer Mother        lung   Hypertension Father    Stroke Father    Hypertension Sister    Heart disease Sister    Cancer  Brother 17       pancreatic   Colon cancer Neg Hx    Allergies  Allergen Reactions   Lisinopril Swelling    Of lips Of lips   Nitrofuran Derivatives       Review of Systems  Constitutional: Negative.   HENT: Negative.    Respiratory: Negative.    Cardiovascular: Negative.  Negative for chest pain.  Gastrointestinal: Negative.   Genitourinary: Negative.   Musculoskeletal: Negative.   Skin: Negative.  Negative for itching and rash.  Neurological:  Negative for headaches.  Psychiatric/Behavioral:  The patient is nervous/anxious.   All other systems reviewed and are negative.      Objective:     BP 117/72   Pulse 65   Temp 97.9 F (36.6 C) (Temporal)   Ht _0  (1.626 m)   Wt 140 lb 4 oz (63.6 kg)   SpO2 95%   BMI 24.07 kg/m  BP Readings from Last 3 Encounters:  09/28/21 117/72  06/02/21 133/79  04/26/21 111/63   Wt Readings from Last 3 Encounters:  09/28/21 140 lb 4 oz (63.6 kg)  06/02/21 141 lb 9.6 oz (64.2 kg)  04/26/21 141 lb (64 kg)      Physical Exam Vitals and nursing note reviewed.  Constitutional:      Appearance: Normal appearance.  HENT:     Head: Normocephalic.     Right Ear: External ear normal.     Left Ear: External ear normal.     Nose: Nose normal.  Cardiovascular:     Rate and Rhythm: Normal rate and regular rhythm.     Pulses: Normal pulses.     Heart sounds: Normal heart sounds.  Pulmonary:     Effort:  Pulmonary effort is normal.     Breath sounds: Normal breath sounds.  Skin:    General: Skin is warm.     Findings: No rash.  Neurological:     General: No focal deficit present.     Mental Status: She is alert and oriented to person, place, and time.  Psychiatric:        Attention and Perception: Attention normal.        Mood and Affect: Mood is anxious.      No results found for any visits on 09/28/21.  Last CBC Lab Results  Component Value Date   WBC 6.8 04/26/2021   HGB 12.1 04/26/2021   HCT 35.3 04/26/2021    MCV 84 04/26/2021   MCH 28.9 04/26/2021   RDW 12.4 04/26/2021   PLT 327 77/41/2878   Last metabolic panel Lab Results  Component Value Date   GLUCOSE 105 (H) 04/26/2021   NA 143 04/26/2021   K 3.4 (L) 04/26/2021   CL 103 04/26/2021   CO2 26 04/26/2021   BUN 23 04/26/2021   CREATININE 0.93 04/26/2021   EGFR 70 04/26/2021   CALCIUM 9.4 04/26/2021   PROT 6.8 04/26/2021   ALBUMIN 4.2 04/26/2021   LABGLOB 2.6 04/26/2021   AGRATIO 1.6 04/26/2021   BILITOT 0.3 04/26/2021   ALKPHOS 189 (H) 04/26/2021   AST 27 04/26/2021   ALT 39 (H) 04/26/2021   ANIONGAP 10 10/22/2019   Last lipids Lab Results  Component Value Date   CHOL 211 (H) 04/26/2021   HDL 68 04/26/2021   LDLCALC 117 (H) 04/26/2021   TRIG 148 04/26/2021   CHOLHDL 3.1 04/26/2021      The 10-year ASCVD risk score (Arnett DK, et al., 2019) is: 4.2%    Assessment & Plan:   Problem List Items Addressed This Visit       Cardiovascular and Mediastinum   Hypertension    Hypertension well-controlled on current medication no changes necessary.  Rx refill sent to pharmacy.  Follow-up in 6 months        Other   Anxiety - Primary    Anxiety well controlled on 0.5 mg tablet Xanax.  No changes necessary.  Patient's symptoms are improving follow-up as needed.  Tox assure completed pending results.  Will send medication to pharmacy after      Depression, recurrent ([place])    Depression well-controlled no changes necessary.      Other Visit Diagnoses     Encounter for drug screening       Relevant Orders   ToxASSURE Select 13 (MW), Urine   MM Digital Screening   Screening for colon cancer       Relevant Orders   Cologuard   Essential hypertension       Relevant Orders   CBC with Differential   CMP14+EGFR   Lipid Panel       Return in about 6 months (around 03/29/2022), or if symptoms worsen or fail to improve, for chronic disease management.    Ivy Lynn, NP

## 2021-09-28 NOTE — Assessment & Plan Note (Signed)
Hypertension well-controlled on current medication no changes necessary.  Rx refill sent to pharmacy.  Follow-up in 6 months

## 2021-09-28 NOTE — Patient Instructions (Addendum)
Hypertension, Adult Hypertension is another name for high blood pressure. High blood pressure forces your heart to work harder to pump blood. This can cause problems over time. There are two numbers in a blood pressure reading. There is a top number (systolic) over a bottom number (diastolic). It is best to have a blood pressure that is below 120/80. What are the causes? The cause of this condition is not known. Some other conditions can lead to high blood pressure. What increases the risk? Some lifestyle factors can make you more likely to develop high blood pressure: Smoking. Not getting enough exercise or physical activity. Being overweight. Having too much fat, sugar, calories, or salt (sodium) in your diet. Drinking too much alcohol. Other risk factors include: Having any of these conditions: Heart disease. Diabetes. High cholesterol. Kidney disease. Obstructive sleep apnea. Having a family history of high blood pressure and high cholesterol. Age. The risk increases with age. Stress. What are the signs or symptoms? High blood pressure may not cause symptoms. Very high blood pressure (hypertensive crisis) may cause: Headache. Fast or uneven heartbeats (palpitations). Shortness of breath. Nosebleed. Vomiting or feeling like you may vomit (nauseous). Changes in how you see. Very bad chest pain. Feeling dizzy. Seizures. How is this treated? This condition is treated by making healthy lifestyle changes, such as: Eating healthy foods. Exercising more. Drinking less alcohol. Your doctor may prescribe medicine if lifestyle changes do not help enough and if: Your top number is above 130. Your bottom number is above 80. Your personal target blood pressure may vary. Follow these instructions at home: Eating and drinking  If told, follow the DASH eating plan. To follow this plan: Fill one half of your plate at each meal with fruits and vegetables. Fill one fourth of your plate  at each meal with whole grains. Whole grains include whole-wheat pasta, brown rice, and whole-grain bread. Eat or drink low-fat dairy products, such as skim milk or low-fat yogurt. Fill one fourth of your plate at each meal with low-fat (lean) proteins. Low-fat proteins include fish, chicken without skin, eggs, beans, and tofu. Avoid fatty meat, cured and processed meat, or chicken with skin. Avoid pre-made or processed food. Limit the amount of salt in your diet to less than 1,500 mg each day. Do not drink alcohol if: Your doctor tells you not to drink. You are pregnant, may be pregnant, or are planning to become pregnant. If you drink alcohol: Limit how much you have to: 0-1 drink a day for women. 0-2 drinks a day for men. Know how much alcohol is in your drink. In the [place] one drink equals one 12 oz bottle of beer (355 mL), one 5 oz glass of wine (148 mL), or one 1 oz glass of hard liquor (44 mL). Lifestyle  Work with your doctor to stay at a healthy weight or to lose weight. Ask your doctor what the best weight is for you. Get at least 30 minutes of exercise that causes your heart to beat faster (aerobic exercise) most days of the week. This may include walking, swimming, or biking. Get at least 30 minutes of exercise that strengthens your muscles (resistance exercise) at least 3 days a week. This may include lifting weights or doing Pilates. Do not smoke or use any products that contain nicotine or tobacco. If you need help quitting, ask your doctor. Check your blood pressure at home as told by your doctor. Keep all follow-up visits. Medicines Take over-the-counter and prescription medicines  only as told by your doctor. Follow directions carefully. Do not skip doses of blood pressure medicine. The medicine does not work as well if you skip doses. Skipping doses also puts you at risk for problems. Ask your doctor about side effects or reactions to medicines that you should watch  for. Contact a doctor if: You think you are having a reaction to the medicine you are taking. You have headaches that keep coming back. You feel dizzy. You have swelling in your ankles. You have trouble with your vision. Get help right away if: You get a very bad headache. You start to feel mixed up (confused). You feel weak or numb. You feel faint. You have very bad pain in your: Chest. Belly (abdomen). You vomit more than once. You have trouble breathing. These symptoms may be an emergency. Get help right away. Call 911. Do not wait to see if the symptoms will go away. Do not drive yourself to the hospital. Summary Hypertension is another name for high blood pressure. High blood pressure forces your heart to work harder to pump blood. For most people, a normal blood pressure is less than 120/80. Making healthy choices can help lower blood pressure. If your blood pressure does not get lower with healthy choices, you may need to take medicine. This information is not intended to replace advice given to you by your health care provider. Make sure you discuss any questions you have with your health care provider. Document Revised: 10/29/2020 Document Reviewed: 10/29/2020 Elsevier Patient Education  [place]. Generalized Anxiety Disorder, Adult Generalized anxiety disorder (GAD) is a mental health condition. Unlike normal worries, anxiety related to GAD is not triggered by a specific event. These worries do not fade or get better with time. GAD interferes with relationships, work, and school. GAD symptoms can vary from mild to severe. People with severe GAD can have intense waves of anxiety with physical symptoms that are similar to panic attacks. What are the causes? The exact cause of GAD is not known, but the following are believed to have an impact: Differences in natural brain chemicals. Genes passed down from parents to children. Differences in the way threats are  perceived. Development and stress during childhood. Personality. What increases the risk? The following factors may make you more likely to develop this condition: Being female. Having a family history of anxiety disorders. Being very shy. Experiencing very stressful life events, such as the death of a loved one. Having a very stressful family environment. What are the signs or symptoms? People with GAD often worry excessively about many things in their lives, such as their health and family. Symptoms may also include: Mental and emotional symptoms: Worrying excessively about natural disasters. Fear of being late. Difficulty concentrating. Fears that others are judging your performance. Physical symptoms: Fatigue. Headaches, muscle tension, muscle twitches, trembling, or feeling shaky. Feeling like your heart is pounding or beating very fast. Feeling out of breath or like you cannot take a deep breath. Having trouble falling asleep or staying asleep, or experiencing restlessness. Sweating. Nausea, diarrhea, or irritable bowel syndrome (IBS). Behavioral symptoms: Experiencing erratic moods or irritability. Avoidance of new situations. Avoidance of people. Extreme difficulty making decisions. How is this diagnosed? This condition is diagnosed based on your symptoms and medical history. You will also have a physical exam. Your health care provider may perform tests to rule out other possible causes of your symptoms. To be diagnosed with GAD, a person must have anxiety that:  Is out of his or her control. Affects several different aspects of his or her life, such as work and relationships. Causes distress that makes him or her unable to take part in normal activities. Includes at least three symptoms of GAD, such as restlessness, fatigue, trouble concentrating, irritability, muscle tension, or sleep problems. Before your health care provider can confirm a diagnosis of GAD, these  symptoms must be present more days than they are not, and they must last for 6 months or longer. How is this treated? This condition may be treated with: Medicine. Antidepressant medicine is usually prescribed for long-term daily control. Anti-anxiety medicines may be added in severe cases, especially when panic attacks occur. Talk therapy (psychotherapy). Certain types of talk therapy can be helpful in treating GAD by providing support, education, and guidance. Options include: Cognitive behavioral therapy (CBT). People learn coping skills and self-calming techniques to ease their physical symptoms. They learn to identify unrealistic thoughts and behaviors and to replace them with more appropriate thoughts and behaviors. Acceptance and commitment therapy (ACT). This treatment teaches people how to be mindful as a way to cope with unwanted thoughts and feelings. Biofeedback. This process trains you to manage your body's response (physiological response) through breathing techniques and relaxation methods. You will work with a therapist while machines are used to monitor your physical symptoms. Stress management techniques. These include yoga, meditation, and exercise. A mental health specialist can help determine which treatment is best for you. Some people see improvement with one type of therapy. However, other people require a combination of therapies. Follow these instructions at home: Lifestyle Maintain a consistent routine and schedule. Anticipate stressful situations. Create a plan and allow extra time to work with your plan. Practice stress management or self-calming techniques that you have learned from your therapist or your health care provider. Exercise regularly and spend time outdoors. Eat a healthy diet that includes plenty of vegetables, fruits, whole grains, low-fat dairy products, and lean protein. Do not eat a lot of foods that are high in fat, added sugar, or salt  (sodium). Drink plenty of water. Avoid alcohol. Alcohol can increase anxiety. Avoid caffeine and certain over-the-counter cold medicines. These may make you feel worse. Ask your pharmacist which medicines to avoid. General instructions Take over-the-counter and prescription medicines only as told by your health care provider. Understand that you are likely to have setbacks. Accept this and be kind to yourself as you persist to take better care of yourself. Anticipate stressful situations. Create a plan and allow extra time to work with your plan. Recognize and accept your accomplishments, even if you judge them as small. Spend time with people who care about you. Keep all follow-up visits. This is important. Where to find more information [place]: https://carter.com/ Substance Abuse and Mental Health Services: ktimeonline.com Contact a health care provider if: Your symptoms do not get better. Your symptoms get worse. You have signs of depression, such as: A persistently sad or irritable mood. Loss of enjoyment in activities that used to bring you joy. Change in weight or eating. Changes in sleeping habits. Get help right away if: You have thoughts about hurting yourself or others. If you ever feel like you may hurt yourself or others, or have thoughts about taking your own life, get help right away. Go to your nearest emergency department or: Call your local emergency services (911 in the [place] Call a suicide crisis helpline, such as the [facility] at  (856)536-0228 or 988 in the [place] This is open 24 hours a day in the [place] Text the Crisis Text Line at 734-774-6527 (in the [place].). Summary Generalized anxiety disorder (GAD) is a mental health condition that involves worry that is not triggered by a specific event. People with GAD often worry excessively about many things in their lives, such as their health and family. GAD may cause symptoms  such as restlessness, trouble concentrating, sleep problems, frequent sweating, nausea, diarrhea, headaches, and trembling or muscle twitching. A mental health specialist can help determine which treatment is best for you. Some people see improvement with one type of therapy. However, other people require a combination of therapies. This information is not intended to replace advice given to you by your health care provider. Make sure you discuss any questions you have with your health care provider. Document Revised: 08/05/2020 Document Reviewed: 05/03/2020 Elsevier Patient Education  [place].

## 2021-09-28 NOTE — Assessment & Plan Note (Signed)
Depression well-controlled no changes necessary.

## 2021-09-28 NOTE — Assessment & Plan Note (Signed)
Anxiety well controlled on 0.5 mg tablet Xanax.  No changes necessary.  Patient's symptoms are improving follow-up as needed.  Tox assure completed pending results.  Will send medication to pharmacy after

## 2021-09-29 ENCOUNTER — Telehealth: Payer: Self-pay | Admitting: Nurse Practitioner

## 2021-09-29 LAB — CMP14+EGFR
ALT: 59 IU/L — ABNORMAL HIGH (ref 0–32)
AST: 45 IU/L — ABNORMAL HIGH (ref 0–40)
Albumin/Globulin Ratio: 1.7 (ref 1.2–2.2)
Albumin: 4.4 g/dL (ref 3.9–4.9)
Alkaline Phosphatase: 193 IU/L — ABNORMAL HIGH (ref 44–121)
BUN/Creatinine Ratio: 34 — ABNORMAL HIGH (ref 12–28)
BUN: 30 mg/dL — ABNORMAL HIGH (ref 8–27)
Bilirubin Total: 0.3 mg/dL (ref 0.0–1.2)
CO2: 27 mmol/L (ref 20–29)
Calcium: 9.6 mg/dL (ref 8.7–10.3)
Chloride: 101 mmol/L (ref 96–106)
Creatinine, Ser: 0.87 mg/dL (ref 0.57–1.00)
Globulin, Total: 2.6 g/dL (ref 1.5–4.5)
Glucose: 92 mg/dL (ref 70–99)
Potassium: 3.2 mmol/L — ABNORMAL LOW (ref 3.5–5.2)
Sodium: 141 mmol/L (ref 134–144)
Total Protein: 7 g/dL (ref 6.0–8.5)
eGFR: 75 mL/min/{1.73_m2} (ref >59)

## 2021-09-29 LAB — CBC WITH DIFFERENTIAL/PLATELET
Basophils Absolute: 0.1 10*3/uL (ref 0.0–0.2)
Basos: 2 %
EOS (ABSOLUTE): 0.3 10*3/uL (ref 0.0–0.4)
Eos: 7 %
Hematocrit: 36.9 % (ref 34.0–46.6)
Hemoglobin: 12.6 g/dL (ref 11.1–15.9)
Immature Grans (Abs): 0 10*3/uL (ref 0.0–0.1)
Immature Granulocytes: 0 %
Lymphocytes Absolute: 1.4 10*3/uL (ref 0.7–3.1)
Lymphs: 28 %
MCH: 29.6 pg (ref 26.6–33.0)
MCHC: 34.1 g/dL (ref 31.5–35.7)
MCV: 87 fL (ref 79–97)
Monocytes Absolute: 0.6 10*3/uL (ref 0.1–0.9)
Monocytes: 12 %
Neutrophils Absolute: 2.5 10*3/uL (ref 1.4–7.0)
Neutrophils: 51 %
Platelets: 281 10*3/uL (ref 150–450)
RBC: 4.26 x10E6/uL (ref 3.77–5.28)
RDW: 11.7 % (ref 11.7–15.4)
WBC: 5 10*3/uL (ref 3.4–10.8)

## 2021-09-29 LAB — LIPID PANEL
Chol/HDL Ratio: 2.5 ratio (ref 0.0–4.4)
Cholesterol, Total: 225 mg/dL — ABNORMAL HIGH (ref 100–199)
HDL: 89 mg/dL (ref >39)
LDL Chol Calc (NIH): 116 mg/dL — ABNORMAL HIGH (ref 0–99)
Triglycerides: 116 mg/dL (ref 0–149)
VLDL Cholesterol Cal: 20 mg/dL (ref 5–40)

## 2021-09-30 ENCOUNTER — Other Ambulatory Visit: Payer: Self-pay | Admitting: Nurse Practitioner

## 2021-09-30 DIAGNOSIS — F419 Anxiety disorder, unspecified: Secondary | ICD-10-CM

## 2021-09-30 DIAGNOSIS — Z79899 Other long term (current) drug therapy: Secondary | ICD-10-CM

## 2021-09-30 DIAGNOSIS — M5030 Other cervical disc degeneration, unspecified cervical region: Secondary | ICD-10-CM

## 2021-09-30 LAB — TOXASSURE SELECT 13 (MW), URINE

## 2021-09-30 MED ORDER — ALPRAZOLAM 0.5 MG PO TABS: ORAL_TABLET | ORAL | 2 refills | Status: DC

## 2021-09-30 MED ORDER — HYDROCODONE-ACETAMINOPHEN 10-325 MG PO TABS: 1.0000 | ORAL_TABLET | Freq: Three times a day (TID) | ORAL | 0 refills | Status: DC | PRN

## 2021-10-13 ENCOUNTER — Ambulatory Visit
Admission: RE | Admit: 2021-10-13 | Discharge: 2021-10-13 | Disposition: A | Discharge status: Home / Self Care | Payer: 59 | Source: Ambulatory Visit | Attending: Nurse Practitioner | Admitting: Nurse Practitioner

## 2021-10-13 DIAGNOSIS — Z1211 Encounter for screening for malignant neoplasm of colon: Secondary | ICD-10-CM | POA: Diagnosis not present

## 2021-10-13 DIAGNOSIS — Z1231 Encounter for screening mammogram for malignant neoplasm of breast: Secondary | ICD-10-CM | POA: Diagnosis not present

## 2021-10-18 LAB — COLOGUARD: COLOGUARD: NEGATIVE

## 2021-10-29 ENCOUNTER — Other Ambulatory Visit: Payer: Self-pay | Admitting: Nurse Practitioner

## 2021-10-29 DIAGNOSIS — I10 Essential (primary) hypertension: Secondary | ICD-10-CM

## 2021-11-02 ENCOUNTER — Telehealth: Payer: Self-pay | Admitting: Acute Care

## 2021-11-02 NOTE — Telephone Encounter (Signed)
Pt has MRI scheduled on 12/27. Stated she needed to reschedule. Offered to give [place] number and pt became annoyed and short stating she tried to do that already and that she was transferred to [place] to speak with Judson Roch regarding this. Please advise.

## 2021-11-02 NOTE — Telephone Encounter (Signed)
Spoke with pt regarding Ct scheduled for 01/19/22. Pt wanted to see if she could get CT moved a few weeks sooner. I explained to pt that insurance will only approve for yearly lung screening CT to be done once every 365 days and will not approve if done sooner. Pt verbalized understanding and will keep the current appt scheduled.

## 2021-11-09 DIAGNOSIS — M533 Sacrococcygeal disorders, not elsewhere classified: Secondary | ICD-10-CM | POA: Diagnosis not present

## 2021-12-22 DIAGNOSIS — M533 Sacrococcygeal disorders, not elsewhere classified: Secondary | ICD-10-CM | POA: Diagnosis not present

## 2022-01-03 ENCOUNTER — Ambulatory Visit: Payer: 59 | Admitting: Nurse Practitioner

## 2022-01-03 ENCOUNTER — Encounter: Payer: Self-pay | Admitting: Nurse Practitioner

## 2022-01-03 VITALS — BP 130/67 | HR 60 | Temp 98.5°F | Ht 64.0 in | Wt 144.0 lb

## 2022-01-03 DIAGNOSIS — M5030 Other cervical disc degeneration, unspecified cervical region: Secondary | ICD-10-CM

## 2022-01-03 DIAGNOSIS — I10 Essential (primary) hypertension: Secondary | ICD-10-CM

## 2022-01-03 DIAGNOSIS — L989 Disorder of the skin and subcutaneous tissue, unspecified: Secondary | ICD-10-CM | POA: Diagnosis not present

## 2022-01-03 MED ORDER — HYDROCODONE-ACETAMINOPHEN 10-325 MG PO TABS: 1.0000 | ORAL_TABLET | Freq: Three times a day (TID) | ORAL | 0 refills | Status: AC | PRN

## 2022-01-03 NOTE — Progress Notes (Addendum)
Established Patient Office Visit  Subjective   Patient ID: [name], female    DOB: Dec 21, 1960  Age: 61 y.o. MRN: [id number]  Chief Complaint  Patient presents with   Medication Refill    Back Pain This is a chronic problem. The current episode started more than 1 year ago. The problem occurs constantly. The problem is unchanged. The pain is present in the lumbar spine. The quality of the pain is described as aching. The pain does not radiate. The pain is at a severity of 7/10. The pain is severe. The pain is The same all the time. The symptoms are aggravated by bending, lying down, position and sitting. Stiffness is present All day. Pertinent negatives include no abdominal pain, numbness, paresis or paresthesias. Treatments tried: Hydrocodone. The treatment provided significant relief.  Rash This is a new problem. The current episode started more than 1 month ago. The problem is unchanged. The affected locations include the face. The rash is characterized by dryness and scaling. She was exposed to nothing. Past treatments include nothing.    Patient Active Problem List   Diagnosis Date Noted   Annual physical exam 04/26/2021   Insomnia 01/11/2021   Depression, recurrent ([place]) 07/24/2020   Controlled substance agreement signed 05/25/2020   Simple chronic bronchitis ([place]) 05/10/2019   Elevated LFTs 03/20/2019   Neuropathy 09/27/2016   DDD (degenerative disc disease), cervical 12/08/2015   History of lung cancer 12/08/2015   Cervical disc disorder with radiculopathy of cervical region 09/23/2015   Gastroesophageal reflux disease without esophagitis 03/19/2015   Menopausal vaginal dryness 02/17/2015   Constipation 02/17/2015   Esophageal dysphagia 09/25/2013   Hiccups 04/19/2013   Unspecified constipation 04/18/2013   Anxiety    Hypertension    Past Medical History:  Diagnosis Date   Anxiety    Arthritis    Constipation 02/17/2015   Depression, recurrent ([place])  07/24/2020   Emphysema lung ([facility])    Fibromyalgia    GERD (gastroesophageal reflux disease)    Hypertension    Lung cancer ([place]) 2012   pancoast tumor   Menopausal vaginal dryness 02/17/2015   Past Surgical History:  Procedure Laterality Date   CHOLECYSTECTOMY N/A 04/01/2016   Procedure: LAPAROSCOPIC CHOLECYSTECTOMY;  Surgeon: Aviva Signs, MD;  Location: [facility];  Service: General;  Laterality: N/A;   COLONOSCOPY  11/2010   normal colonoscopy from anal verge to ileocecal valve; no polyps, masses, AVMs, diverticula, or evidence of inflammation   ESOPHAGOGASTRODUODENOSCOPY N/A 10/16/2013   PVX:YIAXKPVV ring s/p dilation/2 cm HH. multiple 3 mm round and shallow erosions. 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 Hiatal hernia   LAPAROSCOPIC APPENDECTOMY N/A 06/21/2013   Procedure: APPENDECTOMY LAPAROSCOPIC;  Surgeon: Jamesetta So, MD;  Location: [facility];  Service: General;  Laterality: N/A;   MALONEY DILATION N/A 10/16/2013   Procedure: Keturah Shavers;  Surgeon: Daneil Dolin, MD;  Location: [facility];  Service: Endoscopy;  Laterality: N/ASol Passer PLACEMENT  [id number]   Rt   IJ  Port-A-Cath  [email or web address]   STERNAL WIRES REMOVAL  01/23/2012   Procedure: STERNAL WIRES REMOVAL;  Surgeon: Grace Isaac, MD;  Location: [facility];  Service: Thoracic;  Laterality: N/A;   THORACOTOMY  [id number]   transsmanubrial anterolaterl thoracotomy with chest wall resection includinf the first rib and left  upper lobectomy for Pancoast turmor   TUBAL LIGATION     Social History   Tobacco  Use   Smoking status: Former    Packs/day: 1.50    Years: 35.00    Total pack years: 52.50    Types: Cigarettes    Quit date: 02/04/2010    Years since quitting: 11.9   Smokeless tobacco: Never   Tobacco comments:    Quit x 8 years  Vaping Use   Vaping Use: Never used  Substance Use Topics   Alcohol use: Yes    Alcohol/week:  0.0 standard drinks of alcohol    Comment: occasionally    Drug use: No   Social History   Socioeconomic History   Marital status: Married    Spouse name: Not on file   Number of children: Not on file   Years of education: Not on file   Highest education level: Not on file  Occupational History   Not on file  Tobacco Use   Smoking status: Former    Packs/day: 1.50    Years: 35.00    Total pack years: 52.50    Types: Cigarettes    Quit date: 02/04/2010    Years since quitting: 11.9   Smokeless tobacco: Never   Tobacco comments:    Quit x 8 years  Vaping Use   Vaping Use: Never used  Substance and Sexual Activity   Alcohol use: Yes    Alcohol/week: 0.0 standard drinks of alcohol    Comment: occasionally    Drug use: No   Sexual activity: Yes    Birth control/protection: Post-menopausal  Other Topics Concern   Not on file  Social History Narrative   Not on file   Social Determinants of Health   Financial Resource Strain: Not on file  Food Insecurity: Not on file  Transportation Needs: Not on file  Physical Activity: Not on file  Stress: Not on file  Social Connections: Not on file  Intimate Partner Violence: Not on file   Family Status  Relation Name Status   Mother  Deceased   Father  Deceased   Sister  Deceased   Daughter  Alive   Brother  Deceased   Son  Alive   Neg Hx  (Not Specified)   Family History  Problem Relation Age of Onset   Cancer Mother        lung   Hypertension Father    Stroke Father    Hypertension Sister    Heart disease Sister    Cancer Brother 32       pancreatic   Colon cancer Neg Hx    Breast cancer Neg Hx    Allergies  Allergen Reactions   Lisinopril Swelling    Of lips Of lips   Nitrofuran Derivatives       Review of Systems  Constitutional: Negative.   HENT: Negative.    Respiratory: Negative.    Cardiovascular: Negative.   Gastrointestinal:  Negative for abdominal pain.  Genitourinary: Negative.    Musculoskeletal:  Positive for back pain.  Skin:  Positive for rash.  Neurological: Negative.  Negative for numbness and paresthesias.  All other systems reviewed and are negative.     Objective:     BP 130/67   Pulse 60   Temp 98.5 F (36.9 C)   Ht _0  (1.626 m)   Wt 144 lb (65.3 kg)   SpO2 97%   BMI 24.72 kg/m  BP Readings from Last 3 Encounters:  01/03/22 130/67  09/28/21 117/72  06/02/21 133/79   Wt Readings from Last 3 Encounters:  01/03/22 144 lb (65.3 kg)  09/28/21 140 lb 4 oz (63.6 kg)  06/02/21 141 lb 9.6 oz (64.2 kg)      Physical Exam Vitals and nursing note reviewed.  Constitutional:      Appearance: Normal appearance.  HENT:     Head:      Comments: Skin lesion on left side of face    Right Ear: External ear normal.     Left Ear: External ear normal.     Nose: Nose normal.     Mouth/Throat:     Mouth: Mucous membranes are moist.     Pharynx: Oropharynx is clear.  Eyes:     Conjunctiva/sclera: Conjunctivae normal.     Pupils: Pupils are equal, round, and reactive to light.  Cardiovascular:     Rate and Rhythm: Normal rate and regular rhythm.     Pulses: Normal pulses.     Heart sounds: Normal heart sounds.  Pulmonary:     Effort: Pulmonary effort is normal.     Breath sounds: Normal breath sounds.  Abdominal:     General: Bowel sounds are normal.  Musculoskeletal:     Lumbar back: Tenderness present. Decreased range of motion.       Back:     Comments: Lower back pain   Skin:    General: Skin is warm.     Findings: No erythema or rash.  Neurological:     General: No focal deficit present.     Mental Status: She is alert and oriented to person, place, and time.  Psychiatric:        Mood and Affect: Mood normal.        Behavior: Behavior normal.      No results found for any visits on 01/03/22.  Last CBC Lab Results  Component Value Date   WBC 5.0 09/28/2021   HGB 12.6 09/28/2021   HCT 36.9 09/28/2021   MCV 87 09/28/2021    MCH 29.6 09/28/2021   RDW 11.7 09/28/2021   PLT 281 41/66/0630   Last metabolic panel Lab Results  Component Value Date   GLUCOSE 92 09/28/2021   NA 141 09/28/2021   K 3.2 (L) 09/28/2021   CL 101 09/28/2021   CO2 27 09/28/2021   BUN 30 (H) 09/28/2021   CREATININE 0.87 09/28/2021   EGFR 75 09/28/2021   CALCIUM 9.6 09/28/2021   PROT 7.0 09/28/2021   ALBUMIN 4.4 09/28/2021   LABGLOB 2.6 09/28/2021   AGRATIO 1.7 09/28/2021   BILITOT 0.3 09/28/2021   ALKPHOS 193 (H) 09/28/2021   AST 45 (H) 09/28/2021   ALT 59 (H) 09/28/2021   ANIONGAP 10 10/22/2019   Last lipids Lab Results  Component Value Date   CHOL 225 (H) 09/28/2021   HDL 89 09/28/2021   LDLCALC 116 (H) 09/28/2021   TRIG 116 09/28/2021   CHOLHDL 2.5 09/28/2021   Last hemoglobin A1c Lab Results  Component Value Date   HGBA1C 5.9 (H) 02/17/2015   Last thyroid functions Lab Results  Component Value Date   TSH 1.570 05/10/2019   T4TOTAL 8.1 05/10/2019   Last vitamin D Lab Results  Component Value Date   VD25OH 25.1 (L) 04/04/2017   Last vitamin B12 and Folate Lab Results  Component Value Date   VITAMINB12 550 04/04/2017      The 10-year ASCVD risk score (Arnett DK, et al., 2019) is: 4.6%    Assessment & Plan:  For skin lesion on left chin/jaw line of face, completed referral  to dermatology, advised patient not to scratch face or try to peel of lesion.  Problem List Items Addressed This Visit       Cardiovascular and Mediastinum   Hypertension    Blood pressure well controlled on current medication , no changes needed.        Musculoskeletal and Integument   DDD (degenerative disc disease), cervical - Primary    Back pain well controlled on current medication. Rx refill sent to pharmacy. We discussed future pain clinic management of chronic pain management.       Relevant Medications   HYDROcodone-acetaminophen (NORCO) 10-325 MG tablet   Other Visit Diagnoses     Skin lesion of face        Relevant Orders   Ambulatory referral to Dermatology       Return in about 6 months (around 07/05/2022).    Ivy Lynn, NP

## 2022-01-03 NOTE — Assessment & Plan Note (Signed)
Blood pressure well controlled on current medication , no changes needed.

## 2022-01-03 NOTE — Patient Instructions (Signed)
Chronic Back Pain When back pain lasts longer than 3 months, it is called chronic back pain. Pain may get worse at certain times (flare-ups). There are things you can do at home to manage your pain. Follow these instructions at home: Pay attention to any changes in your symptoms. Take these actions to help with your pain: Managing pain and stiffness     If told, put ice on the painful area. Your doctor may tell you to use ice for 24-48 hours after the flare-up starts. To do this: Put ice in a plastic bag. Place a towel between your skin and the bag. Leave the ice on for 20 minutes, 2-3 times a day. If told, put heat on the painful area. Do this as often as told by your doctor. Use the heat source that your doctor recommends, such as a moist heat pack or a heating pad. Place a towel between your skin and the heat source. Leave the heat on for 20-30 minutes. Take off the heat if your skin turns bright red. This is especially important if you are unable to feel pain, heat, or cold. You may have a greater risk of getting burned. Soak in a warm bath. This can help relieve pain. Activity  Avoid bending and other activities that make pain worse. When standing: Keep your upper back and neck straight. Keep your shoulders pulled back. Avoid slouching. When sitting: Keep your back straight. Relax your shoulders. Do not round your shoulders or pull them backward. Do not sit or stand in one place for long periods of time. Take short rest breaks during the day. Lying down or standing is usually better than sitting. Resting can help relieve pain. When sitting or lying down for a long time, do some mild activity or stretching. This will help to prevent stiffness and pain. Get regular exercise. Ask your doctor what activities are safe for you. Do not lift anything that is heavier than 10 lb (4.5 kg) or the limit that you are told, until your doctor says that it is safe. To prevent injury when you lift  things: Bend your knees. Keep the weight close to your body. Avoid twisting. Sleep on a firm mattress. Try lying on your side with your knees slightly bent. If you lie on your back, put a pillow under your knees. Medicines Treatment may include medicines for pain and swelling taken by mouth or put on the skin, prescription pain medicine, or muscle relaxants. Take over-the-counter and prescription medicines only as told by your doctor. Ask your doctor if the medicine prescribed to you: Requires you to avoid driving or using machinery. Can cause trouble pooping (constipation). You may need to take these actions to prevent or treat trouble pooping: Drink enough fluid to keep your pee (urine) pale yellow. Take over-the-counter or prescription medicines. Eat foods that are high in fiber. These include beans, whole grains, and fresh fruits and vegetables. Limit foods that are high in fat and sugars. These include fried or sweet foods. General instructions Do not use any products that contain nicotine or tobacco, such as cigarettes, e-cigarettes, and chewing tobacco. If you need help quitting, ask your doctor. Keep all follow-up visits as told by your doctor. This is important. Contact a doctor if: Your pain does not get better with rest or medicine. Your pain gets worse, or you have new pain. You have a high fever. You lose weight very quickly. You have trouble doing your normal activities. Get help right away  if: One or both of your legs or feet feel weak. One or both of your legs or feet lose feeling (have numbness). You have trouble controlling when you poop (have a bowel movement) or pee (urinate). You have bad back pain and: You feel like you may vomit (nauseous), or you vomit. You have pain in your belly (abdomen). You have shortness of breath. You faint. Summary When back pain lasts longer than 3 months, it is called chronic back pain. Pain may get worse at certain times  (flare-ups). Use ice and heat as told by your doctor. Your doctor may tell you to use ice after flare-ups. This information is not intended to replace advice given to you by your health care provider. Make sure you discuss any questions you have with your health care provider. Document Revised: 02/20/2019 Document Reviewed: 02/20/2019 Elsevier Patient Education  [place].

## 2022-01-03 NOTE — Assessment & Plan Note (Signed)
Back pain well controlled on current medication. Rx refill sent to pharmacy. We discussed future pain clinic management of chronic pain management.

## 2022-01-04 NOTE — Addendum Note (Signed)
Addended by: Ivy Lynn on: 01/04/2022 09:25 AM   Modules accepted: Orders

## 2022-01-19 ENCOUNTER — Ambulatory Visit
Admission: RE | Admit: 2022-01-19 | Discharge: 2022-01-19 | Disposition: A | Discharge status: Home / Self Care | Payer: 59 | Source: Ambulatory Visit | Attending: Acute Care | Admitting: Acute Care

## 2022-01-19 DIAGNOSIS — Z87891 Personal history of nicotine dependence: Secondary | ICD-10-CM

## 2022-01-21 ENCOUNTER — Other Ambulatory Visit: Payer: Self-pay | Admitting: Acute Care

## 2022-01-21 DIAGNOSIS — Z122 Encounter for screening for malignant neoplasm of respiratory organs: Secondary | ICD-10-CM

## 2022-01-21 DIAGNOSIS — Z87891 Personal history of nicotine dependence: Secondary | ICD-10-CM

## 2022-01-26 ENCOUNTER — Encounter: Payer: Self-pay | Admitting: Nurse Practitioner

## 2022-01-26 ENCOUNTER — Ambulatory Visit (INDEPENDENT_AMBULATORY_CARE_PROVIDER_SITE_OTHER): Payer: 59 | Admitting: Nurse Practitioner

## 2022-01-26 ENCOUNTER — Other Ambulatory Visit: Payer: Self-pay | Admitting: Nurse Practitioner

## 2022-01-26 VITALS — BP 135/76 | HR 62 | Temp 98.6°F | Ht 64.0 in | Wt 144.0 lb

## 2022-01-26 DIAGNOSIS — M5030 Other cervical disc degeneration, unspecified cervical region: Secondary | ICD-10-CM

## 2022-01-26 DIAGNOSIS — I10 Essential (primary) hypertension: Secondary | ICD-10-CM

## 2022-01-26 DIAGNOSIS — F339 Major depressive disorder, recurrent, unspecified: Secondary | ICD-10-CM | POA: Diagnosis not present

## 2022-01-26 DIAGNOSIS — R69 Illness, unspecified: Secondary | ICD-10-CM | POA: Diagnosis not present

## 2022-01-26 MED ORDER — ESCITALOPRAM OXALATE 20 MG PO TABS: 20.0000 mg | ORAL_TABLET | Freq: Every day | ORAL | 4 refills | Status: DC

## 2022-01-26 MED ORDER — LOSARTAN POTASSIUM 100 MG PO TABS: 100.0000 mg | ORAL_TABLET | Freq: Every day | ORAL | 1 refills | Status: DC

## 2022-01-26 NOTE — Assessment & Plan Note (Signed)
Symptoms are well-controlled on current medication no changes necessary.  Continue Lexapro 10 mg tablet by mouth daily.  Follow-up as needed.

## 2022-01-26 NOTE — Progress Notes (Signed)
Acute Office Visit  Subjective:     Patient ID: [name], female    DOB: 1960/12/08, 62 y.o.   MRN: [id number]  Chief Complaint  Patient presents with   Medication Problem    HPI  Pt presents for follow up of hypertension. Patient was diagnosed in few years ago. The patient is tolerating the medication well without side effects. Compliance with treatment has been good; including taking medication as directed , maintains a healthy diet and regular exercise regimen , and following up as directed.   Depression, Follow-up  She  was last seen for this 3 months ago. Changes made at last visit include continue lexapro 10 mg tablet by mouth daily.   She reports excellent compliance with treatment. She is not having side effects.   She reports good tolerance of treatment. Current symptoms include:  none as of today in clinic She feels she is Improved since last visit.     01/26/2022   10:34 AM 01/03/2022    4:06 PM 09/28/2021    9:23 AM  Depression screen PHQ 2/9  Decreased Interest 0 0 0  Down, Depressed, Hopeless 0 0 0  PHQ - 2 Score 0 0 0  Altered sleeping 0 2 1  Tired, decreased energy 0 1 1  Change in appetite 0 1 0  Feeling bad or failure about yourself  0 0 0  Trouble concentrating 0 0 0  Moving slowly or fidgety/restless 0 0 0  Suicidal thoughts 0  0  PHQ-9 Score 0 4 2  Difficult doing work/chores Not difficult at all Not difficult at all Not difficult at all    Anxiety, Follow-up  She was last seen for anxiety 3 months ago. Changes made at last visit include 0.5 mg xanax.   She reports good compliance with treatment. She reports good tolerance of treatment. She is not having side effects.   She feels her anxiety is mild and Improved since last visit.  Symptoms: No chest pain No difficulty concentrating  No dizziness No fatigue  No feelings of losing control No insomnia  No irritable No palpitations  No panic attacks No racing thoughts  No  shortness of breath No sweating  No tremors/shakes    GAD-7 Results    01/26/2022   10:34 AM 09/28/2021    9:23 AM 06/02/2021   12:13 PM  GAD-7 Generalized Anxiety Disorder Screening Tool  1. Feeling Nervous, Anxious, or on Edge 0 1 0  2. Not Being Able to Stop or Control Worrying 0 0 0  3. Worrying Too Much About Different Things 0 0 0  4. Trouble Relaxing 0 1 1  5. Being So Restless it's Hard To Sit Still 0 1 1  6. Becoming Easily Annoyed or Irritable 0 1 1  7. Feeling Afraid As If Something Awful Might Happen 0 0 0  Total GAD-7 Score 0 4 3  Difficulty At Work, Home, or Getting  Along With Others? Not difficult at all Not difficult at all Somewhat difficult    PHQ-9 Scores    01/26/2022   10:34 AM 01/03/2022    4:06 PM 09/28/2021    9:23 AM  PHQ9 SCORE ONLY  PHQ-9 Total Score 0 4 2      Review of Systems  Constitutional: Negative.   HENT: Negative.    Respiratory: Negative.    Cardiovascular: Negative.   Genitourinary: Negative.   Musculoskeletal: Negative.   Skin: Negative.  Negative for itching and rash.  Psychiatric/Behavioral:  Positive for depression. The patient is nervous/anxious.   All other systems reviewed and are negative.       Objective:    BP 135/76   Pulse 62   Temp 98.6 F (37 C)   Ht 5\' 4"  (1.626 m)   Wt 144 lb (65.3 kg)   SpO2 97%   BMI 24.72 kg/m  BP Readings from Last 3 Encounters:  01/26/22 135/76  01/03/22 130/67  09/28/21 117/72   Wt Readings from Last 3 Encounters:  01/26/22 144 lb (65.3 kg)  01/03/22 144 lb (65.3 kg)  09/28/21 140 lb 4 oz (63.6 kg)      Physical Exam Vitals and nursing note reviewed.  Constitutional:      Appearance: Normal appearance.  HENT:     Head: Normocephalic.     Right Ear: External ear normal.     Left Ear: External ear normal.     Nose: Nose normal.  Eyes:     Conjunctiva/sclera: Conjunctivae normal.  Cardiovascular:     Rate and Rhythm: Normal rate and regular rhythm.     Pulses: Normal  pulses.     Heart sounds: Normal heart sounds.  Pulmonary:     Effort: Pulmonary effort is normal.     Breath sounds: Normal breath sounds.  Abdominal:     General: Bowel sounds are normal.  Skin:    General: Skin is dry.     Findings: No erythema or rash.  Neurological:     General: No focal deficit present.     Mental Status: She is alert and oriented to person, place, and time.  Psychiatric:        Behavior: Behavior normal.     No results found for any visits on 01/26/22.      Assessment & Plan:   Problem List Items Addressed This Visit       Cardiovascular and Mediastinum   Hypertension    Attention will controlled on current medication regimen no changes necessary.  Continue low-sodium diet, exercise and weight loss.  Follow-up in 6 months.      Relevant Medications   losartan (COZAAR) 100 MG tablet     Musculoskeletal and Integument   DDD (degenerative disc disease), cervical    Referral completed for pain clinic/pain management.      Relevant Orders   Ambulatory referral to Pain Clinic     Other   Depression, recurrent ([place]) - Primary    Symptoms are well-controlled on current medication no changes necessary.  Continue Lexapro 10 mg tablet by mouth daily.  Follow-up as needed.      Relevant Medications   escitalopram (LEXAPRO) 20 MG tablet    Meds ordered this encounter  Medications   losartan (COZAAR) 100 MG tablet    Sig: Take 1 tablet (100 mg total) by mouth daily.    Dispense:  90 tablet    Refill:  1   escitalopram (LEXAPRO) 20 MG tablet    Sig: Take 1 tablet (20 mg total) by mouth daily.    Dispense:  30 tablet    Refill:  4    No follow-ups on file.  Ivy Lynn, NP

## 2022-01-26 NOTE — Assessment & Plan Note (Signed)
Referral completed for pain clinic/pain management.

## 2022-01-26 NOTE — Assessment & Plan Note (Signed)
Attention will controlled on current medication regimen no changes necessary.  Continue low-sodium diet, exercise and weight loss.  Follow-up in 6 months.

## 2022-01-28 ENCOUNTER — Encounter: Payer: Self-pay | Admitting: Physical Medicine and Rehabilitation

## 2022-02-04 ENCOUNTER — Ambulatory Visit: Payer: 59 | Admitting: Nurse Practitioner

## 2022-02-16 ENCOUNTER — Encounter: Payer: Self-pay | Admitting: Physical Medicine and Rehabilitation

## 2022-02-16 ENCOUNTER — Encounter: Payer: 59 | Attending: Physical Medicine and Rehabilitation | Admitting: Physical Medicine and Rehabilitation

## 2022-02-16 VITALS — BP 120/81 | HR 75 | Ht 64.0 in | Wt 141.0 lb

## 2022-02-16 DIAGNOSIS — Z5181 Encounter for therapeutic drug level monitoring: Secondary | ICD-10-CM | POA: Diagnosis not present

## 2022-02-16 DIAGNOSIS — Z79891 Long term (current) use of opiate analgesic: Secondary | ICD-10-CM | POA: Diagnosis not present

## 2022-02-16 DIAGNOSIS — G8929 Other chronic pain: Secondary | ICD-10-CM | POA: Diagnosis not present

## 2022-02-16 DIAGNOSIS — M5440 Lumbago with sciatica, unspecified side: Secondary | ICD-10-CM | POA: Insufficient documentation

## 2022-02-16 DIAGNOSIS — M545 Low back pain, unspecified: Secondary | ICD-10-CM | POA: Insufficient documentation

## 2022-02-16 DIAGNOSIS — G894 Chronic pain syndrome: Secondary | ICD-10-CM | POA: Diagnosis not present

## 2022-02-16 MED ORDER — HYDROCODONE-ACETAMINOPHEN 10-325 MG PO TABS: 1.0000 | ORAL_TABLET | Freq: Four times a day (QID) | ORAL | 0 refills | Status: AC | PRN

## 2022-02-16 NOTE — Assessment & Plan Note (Signed)
Indication for chronic opioid: Chronic back pain  Medication and dose: Norco 10 mg Q6H PRN # pills per month: 30  Last UDS date: 02/16/22 Opioid Treatment Agreement signed (Y/N): Y, 02/16/22 Opioid Treatment Agreement last reviewed with patient:   NCCSRS/PDMP reviewed this encounter (include red flags): Yes   Management will include: Medium Risk (10-90 MME) UDS every 3-6 months NCCSR check every visit Follow up Q1M initially, Q3M once established  Will perform pill count at 1 month follow up, and use that as basis for reducing prescription moving forward (was prior getting 40 tabs per month but cutting into 1/4 tabs)

## 2022-02-16 NOTE — Progress Notes (Signed)
Subjective:    Patient ID: [name], female    DOB: 1960/04/21, 62 y.o.   MRN: [id number]  HPI  [name] is a 62 y.o. year old female  who  has a past medical history of Anxiety, Arthritis, Constipation (02/17/2015), Depression, recurrent ([place]) (07/24/2020), Emphysema lung ([place]), Fibromyalgia, GERD (gastroesophageal reflux disease), Hypertension, Lung cancer ([place]) (2012), and Menopausal vaginal dryness (02/17/2015).   They are presenting to PM&R clinic as a new patient for pain management evaluation. They were referred by Dr. Marius Ditch for treatment of back pain.   Source: Bilateral low back radiating into posterior calves.  Inciting incident: Had lung CA in 2012, started getting much worse after radiation/chemo/surgery.  Duration of pain: Constant Description of pain: Sharp, stabbing  Severity: On average 6 /10.   Exacerbating factors: Sleeping on her side or stomach. Sitting for long periods.  Remitting factors: Walking and moving.  Red flag symptoms: Patient denies saddle anesthesia, loss of bowel or bladder continence, new weakness, new numbness/tingling, or pain waking up at nighttime.  Medications tried: Topical medications ( mild effect) : Uses voltaren gel on her hands and feet; not a big difference on her back Nsaids ( mild effect): takes 2 ibuprofen at night; helps  Tylenol  ( x effect): none Opiates  ( good effect): Takes 1/4 hydrocodone at nighttime (she has a cutter at home) and may take 1/4 1-2x during the day.  Gabapentin / Lyrica  ( no effect): tried gabapentin, made her feel "loopy".  TCAs  ( x effect): none SNRIs  ( x effect): none Other  ( good effect): Ice every morning on her back.   Other treatments: PT/OT  ( no effect): "many times"; most recently a few years ago; no benefit.  Accupuncture/chiropractor/massage  ( mild effect): Gets a message occassionally, with benefit. Used to go to chiropractor in [place] for 2-3 months but didn't help.  TENs  unit ( ? effect): Has at home, ? Benefit.  Injections ( moderate effect): Gets at [facility] orthopedics Dr. Mina Marble every 3 months, most recently in December. These are ESIs; unsure which levels. Decreasing duration of benefit. Surgery ( x effect) : Dr. Mina Marble said once back injections stop working, he will refer her to neurosurgery in their office.  Other  ( good effect): Uses a flexible back brace, with benefit.   Goals for pain control: to prevent or delay surgery as long as possible.   Of note, had tooth extractions last week and was told to supplement with her prescribed medications, so she is running low.   Pain Inventory Average Pain 6 Pain Right Now 6 My pain is constant, burning, dull, and aching  In the last 24 hours, has pain interfered with the following? General activity 0 Relation with others 0 Enjoyment of life 0 What TIME of day is your pain at its worst? morning  and night Sleep (in general) Fair  Pain is worse with: bending, sitting, standing, and some activites Pain improves with: rest, heat/ice, medication, and injections Relief from Meds: 7  ability to climb steps?  yes do you drive?  yes  employed # of hrs/week 24  bladder control problems weakness numbness trouble walking spasms  Any changes since last visit?  no  Any changes since last visit?  no    Family History  Problem Relation Age of Onset   Cancer Mother        lung   Hypertension Father    Stroke Father  Hypertension Sister    Heart disease Sister    Cancer Brother 66       pancreatic   Colon cancer Neg Hx    Breast cancer Neg Hx    Social History   Socioeconomic History   Marital status: Married    Spouse name: Not on file   Number of children: Not on file   Years of education: Not on file   Highest education level: Not on file  Occupational History   Not on file  Tobacco Use   Smoking status: Former    Packs/day: 1.50    Years: 35.00    Total pack years: 52.50     Types: Cigarettes    Quit date: 02/04/2010    Years since quitting: 12.0   Smokeless tobacco: Never   Tobacco comments:    Quit x 8 years  Vaping Use   Vaping Use: Never used  Substance and Sexual Activity   Alcohol use: Yes    Alcohol/week: 0.0 standard drinks of alcohol    Comment: occasionally    Drug use: No   Sexual activity: Yes    Birth control/protection: Post-menopausal  Other Topics Concern   Not on file  Social History Narrative   Not on file   Social Determinants of Health   Financial Resource Strain: Not on file  Food Insecurity: Not on file  Transportation Needs: Not on file  Physical Activity: Not on file  Stress: Not on file  Social Connections: Not on file   Past Surgical History:  Procedure Laterality Date   CHOLECYSTECTOMY N/A 04/01/2016   Procedure: LAPAROSCOPIC CHOLECYSTECTOMY;  Surgeon: Aviva Signs, MD;  Location: [facility];  Service: General;  Laterality: N/A;   COLONOSCOPY  11/2010   normal colonoscopy from anal verge to ileocecal valve; no polyps, masses, AVMs, diverticula, or evidence of inflammation   ESOPHAGOGASTRODUODENOSCOPY N/A 10/16/2013   YBO:FBPZWCHE ring s/p dilation/2 cm HH. multiple 3 mm round and shallow erosions. 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 Hiatal hernia   LAPAROSCOPIC APPENDECTOMY N/A 06/21/2013   Procedure: APPENDECTOMY LAPAROSCOPIC;  Surgeon: Jamesetta So, MD;  Location: [facility];  Service: General;  Laterality: N/A;   MALONEY DILATION N/A 10/16/2013   Procedure: Keturah Shavers;  Surgeon: Daneil Dolin, MD;  Location: [facility];  Service: Endoscopy;  Laterality: N/ASol Passer PLACEMENT  [id number]   Rt   IJ  Port-A-Cath  [email or web address]   STERNAL WIRES REMOVAL  01/23/2012   Procedure: STERNAL WIRES REMOVAL;  Surgeon: Grace Isaac, MD;  Location: [facility];  Service: Thoracic;  Laterality: N/A;   THORACOTOMY  [id number]   transsmanubrial  anterolaterl thoracotomy with chest wall resection includinf the first rib and left  upper lobectomy for Pancoast turmor   TUBAL LIGATION     Past Medical History:  Diagnosis Date   Anxiety    Arthritis    Constipation 02/17/2015   Depression, recurrent ([place]) 07/24/2020   Emphysema lung ([place])    Fibromyalgia    GERD (gastroesophageal reflux disease)    Hypertension    Lung cancer ([place]) 2012   pancoast tumor   Menopausal vaginal dryness 02/17/2015   BP 120/81   Pulse 75   Ht 5\' 4"  (1.626 m)   Wt 141 lb (64 kg)   SpO2 96%   BMI 24.20 kg/m   Opioid Risk Score:   Fall Risk Score:  `1  Depression screen [facility] 2/9  02/16/2022   10:14 AM 01/26/2022   10:34 AM 01/03/2022    4:06 PM 09/28/2021    9:23 AM 06/02/2021   12:12 PM 11/09/2020    8:36 AM 07/24/2020    4:17 PM  Depression screen PHQ 2/9  Decreased Interest 0 0 0 0 1 0 2  Down, Depressed, Hopeless 0 0 0 0 1 0 3  PHQ - 2 Score 0 0 0 0 2 0 5  Altered sleeping 1 0 2 1 1 1 3   Tired, decreased energy 1 0 1 1 1 1 2   Change in appetite 0 0 1 0 0 0 2  Feeling bad or failure about yourself  0 0 0 0 0 0 2  Trouble concentrating 0 0 0 0 1 0 3  Moving slowly or fidgety/restless 0 0 0 0 1 0 1  Suicidal thoughts 0 0  0 0 0 0  PHQ-9 Score 2 0 4 2 6 2 18   Difficult doing work/chores Somewhat difficult Not difficult at all Not difficult at all Not difficult at all Somewhat difficult Not difficult at all Very difficult      Review of Systems  Genitourinary:  Negative for difficulty urinating.  Musculoskeletal:  Positive for back pain. Negative for joint swelling.       Left shoulder pain   Neurological:  Negative for dizziness, tremors and numbness.  All other systems reviewed and are negative.      Objective:   Physical Exam  Constitutional: No apparent distress. Appropriate appearance for age.  HENT: No JVD. Neck Supple. Trachea midline. Atraumatic, normocephalic. Eyes: PERRLA. EOMI. Visual fields grossly intact.   Cardiovascular: RRR, no murmurs/rub/gallops. No Edema. Peripheral pulses 2+  Respiratory: CTAB. No rales, rhonchi, or wheezing. On RA.  Skin: C/D/I. No apparent lesions. MSK:      No apparent deformity.      Strength:                RUE: 5/5 SA, 5/5 EF, 5/5 EE, 5/5 WE, 5/5 FF, 5/5 FA                 LUE: 5/5 SA, 5/5 EF, 5/5 EE, 5/5 WE, 5/5 FF, 5/5 FA                 RLE: 4/5 HF, 5/5 KE, 5/5 DF, 5/5 EHL, 5/5 PF                 LLE:  4/5 HF, 5/5 KE, 5/5 DF, 5/5 EHL, 5/5 PF   Neurologic exam:  Cognition: AAO to person, place, time and event.  Sensation: To light touch intact in BL UEs and LEs  Reflexes: 2+ in BL LEs.   Coordination: No apparent tremors. No ataxia on FTN, HTS bilaterally.  Spasticity: MAS 0 in all extremities.  Gait: Normal stance and stride.      Assessment & Plan:   [name] is a 62 y.o. year old female  who  has a past medical history of Anxiety, Arthritis, Constipation (02/17/2015), Depression, recurrent ([place]) (07/24/2020), Emphysema lung ([place]), Fibromyalgia, GERD (gastroesophageal reflux disease), Hypertension, Lung cancer ([place]) (2012), and Menopausal vaginal dryness (02/17/2015).   They are presenting to PM&R clinic as a new patient for pain management evaluation. They were referred by Dr. Marius Ditch for treatment of back pain.    Chronic pain syndrome Assessment & Plan: Indication for chronic opioid: Chronic back pain  Medication and dose: Norco 10 mg Q6H PRN # pills per  month: 30  Last UDS date: 02/16/22 Opioid Treatment Agreement signed (Y/N): Y, 02/16/22 Opioid Treatment Agreement last reviewed with patient:   NCCSRS/PDMP reviewed this encounter (include red flags): Yes   Management will include: Medium Risk (10-90 MME) UDS every 3-6 months NCCSR check every visit Follow up Q1M initially, Q3M once established  Will perform pill count at 1 month follow up, and use that as basis for reducing prescription moving forward (was prior getting 40 tabs per  month but cutting into 1/4 tabs)   Orders: -     ToxAssure Select,+Antidepr,UR  Chronic low back pain with sciatica, sciatica laterality unspecified, unspecified back pain laterality Assessment & Plan: Gets ESI with Dr. Mina Marble; he is offering a surgical referral when these are no longer beneficial. She can continue this management plan with their office.   Please fill out a records request from your orthopedics office for injection reports and most recent MRI of your low back. Please request a disc of the images and the radiology report.  Call the office if any questions or concerns   Encounter for long-term opiate analgesic use -     ToxAssure Select,+Antidepr,UR  Encounter for therapeutic drug monitoring -     ToxAssure Select,+Antidepr,UR  Other orders -     HYDROcodone-Acetaminophen; Take 1 tablet by mouth every 6 (six) hours as needed for severe pain.  Dispense: 30 tablet; Refill: [place], DO 02/16/2022

## 2022-02-16 NOTE — Patient Instructions (Signed)
Today, you had a UDS performed and signed a pain contract. I will be prescribing #30 tabs of your pain medication and have you follow up in 1 month. At that time, bring your pills with you for a pill count.   Please fill out a records request from your orthopedics office for injection reports and most recent MRI of your low back. Please request a disc of the images and the radiology report.  Call the office if any questions or concerns

## 2022-02-16 NOTE — Assessment & Plan Note (Signed)
Gets ESI with Dr. Mina Marble; he is offering a surgical referral when these are no longer beneficial. She can continue this management plan with their office.   Please fill out a records request from your orthopedics office for injection reports and most recent MRI of your low back. Please request a disc of the images and the radiology report.  Call the office if any questions or concerns

## 2022-02-19 LAB — TOXASSURE SELECT,+ANTIDEPR,UR

## 2022-02-23 ENCOUNTER — Ambulatory Visit: Payer: 59 | Admitting: Family Medicine

## 2022-03-16 ENCOUNTER — Ambulatory Visit: Payer: 59 | Admitting: Family Medicine

## 2022-03-16 ENCOUNTER — Ambulatory Visit: Payer: 59 | Admitting: Physical Medicine and Rehabilitation

## 2022-03-23 ENCOUNTER — Encounter: Payer: 59 | Attending: Physical Medicine and Rehabilitation | Admitting: Physical Medicine and Rehabilitation

## 2022-03-23 VITALS — BP 138/91 | HR 63 | Ht 64.0 in | Wt 142.0 lb

## 2022-03-23 DIAGNOSIS — G8929 Other chronic pain: Secondary | ICD-10-CM

## 2022-03-23 DIAGNOSIS — Z79891 Long term (current) use of opiate analgesic: Secondary | ICD-10-CM | POA: Diagnosis not present

## 2022-03-23 DIAGNOSIS — G894 Chronic pain syndrome: Secondary | ICD-10-CM | POA: Insufficient documentation

## 2022-03-23 DIAGNOSIS — M5440 Lumbago with sciatica, unspecified side: Secondary | ICD-10-CM | POA: Diagnosis not present

## 2022-03-23 MED ORDER — HYDROCODONE-ACETAMINOPHEN 5-325 MG PO TABS: 0.5000 | ORAL_TABLET | Freq: Two times a day (BID) | ORAL | 0 refills | Status: DC | PRN

## 2022-03-23 NOTE — Progress Notes (Signed)
Hydrocodone 10 325 #20 destroyed by Collier Bullock RN, witnessed by Jeanell Sparrow, CMA, and patient.

## 2022-03-23 NOTE — Patient Instructions (Signed)
Today, we disposed of your extra tablets of Norco 10 mg and are switching you to Norco 5 mg, number 30 tablets.  Take 1 to half a tab up to twice a day for your back pain.  Call the clinic if you run out of medication early, and follow-up with me in 1 month for further refills.  I have also referred you to physical therapy in [place], with preference to [facility] for your back pain, to work on pain management strategies, endurance, and core strengthening to help support your back.  Try to minimize the use of back braces is much as possible, so that you do not develop abdominal weakness and dependence on these.  I will see back in 1 month

## 2022-03-23 NOTE — Progress Notes (Signed)
Subjective:    Patient ID: [name], female    DOB: 1960-05-04, 62 y.o.   MRN: [id number]  HPI [name] is a 62 y.o. year old female  who  has a past medical history of Anxiety, Arthritis, Constipation (02/17/2015), Depression, recurrent ([place]) (07/24/2020), Emphysema lung ([place]), Fibromyalgia, GERD (gastroesophageal reflux disease), Hypertension, Lung cancer ([place]) (2012), and Menopausal vaginal dryness (02/17/2015).   They are presenting to PM&R clinic as a follow up for chronic back pain.      Plan from last visit:  Chronic pain syndrome Assessment & Plan: Indication for chronic opioid: Chronic back pain  Medication and dose: Norco 10 mg Q6H PRN # pills per month: 30  Last UDS date: 02/16/22 Opioid Treatment Agreement signed (Y/N): Y, 02/16/22 Opioid Treatment Agreement last reviewed with patient:   NCCSRS/PDMP reviewed this encounter (include red flags): Yes    Management will include: Medium Risk (10-90 MME) UDS every 3-6 months NCCSR check every visit Follow up Q1M initially, Q3M once established   Will perform pill count at 1 month follow up, and use that as basis for reducing prescription moving forward (was prior getting 40 tabs per month but cutting into 1/4 tabs)     Orders: -     ToxAssure Select,+Antidepr,UR   Chronic low back pain with sciatica, sciatica laterality unspecified, unspecified back pain laterality Assessment & Plan: Gets ESI with Dr. Mina Marble; he is offering a surgical referral when these are no longer beneficial. She can continue this management plan with their office.    Please fill out a records request from your orthopedics office for injection reports and most recent MRI of your low back. Please request a disc of the images and the radiology report.     Interval Hx:  - Therapies: Does some pelvic home exercises and tried to walk at work; she walks about 2 miles per day. 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  There's a  PT in [place] where [place], someone she has worked with before, works. She asks about if she could go there. It's been a few yesrs since she went.    - Follow ups: Had cervical injection with Dr. Mina Marble 12/2021. She said they are decreasing efficacy; They used to last a year, currently lasting about 2 months.   - Falls: none  - DME: Wearing a soft lower back brace; says she feels unstable if she does not use it. She says her L leg feels weaker if she does not wear it.   - Medications: She is still cutting her Norco into 1/4 pills, taking 1-2x daily. She is no longer taking them at night; she says if she takes 2 advil it's about the same effect.   - Other concerns: none   Pain Inventory Average Pain 6 Pain Right Now 7 My pain is constant, sharp, and aching  In the last 24 hours, has pain interfered with the following? General activity 2 Relation with others 2 Enjoyment of life 2 What TIME of day is your pain at its worst? morning  and night Sleep (in general) Fair  Pain is worse with: sitting and some activites Pain improves with: heat/ice, medication, and injections Relief from Meds: 7  Family History  Problem Relation Age of Onset   Cancer Mother        lung   Hypertension Father    Stroke Father    Hypertension Sister    Heart disease Sister  Cancer Brother 84       pancreatic   Colon cancer Neg Hx    Breast cancer Neg Hx    Social History   Socioeconomic History   Marital status: Married    Spouse name: Not on file   Number of children: Not on file   Years of education: Not on file   Highest education level: Not on file  Occupational History   Not on file  Tobacco Use   Smoking status: Former    Packs/day: 1.50    Years: 35.00    Total pack years: 52.50    Types: Cigarettes    Quit date: 02/04/2010    Years since quitting: 12.1   Smokeless tobacco: Never   Tobacco comments:    Quit x 8 years  Vaping Use   Vaping Use: Never used  Substance and Sexual  Activity   Alcohol use: Yes    Alcohol/week: 0.0 standard drinks of alcohol    Comment: occasionally    Drug use: No   Sexual activity: Yes    Birth control/protection: Post-menopausal  Other Topics Concern   Not on file  Social History Narrative   Not on file   Social Determinants of Health   Financial Resource Strain: Not on file  Food Insecurity: Not on file  Transportation Needs: Not on file  Physical Activity: Not on file  Stress: Not on file  Social Connections: Not on file   Past Surgical History:  Procedure Laterality Date   CHOLECYSTECTOMY N/A 04/01/2016   Procedure: LAPAROSCOPIC CHOLECYSTECTOMY;  Surgeon: Aviva Signs, MD;  Location: [facility];  Service: General;  Laterality: N/A;   COLONOSCOPY  11/2010   normal colonoscopy from anal verge to ileocecal valve; no polyps, masses, AVMs, diverticula, or evidence of inflammation   ESOPHAGOGASTRODUODENOSCOPY N/A 10/16/2013   [id number] ring s/p dilation/2 cm HH. multiple 3 mm round and shallow erosions. 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 Hiatal hernia   LAPAROSCOPIC APPENDECTOMY N/A 06/21/2013   Procedure: APPENDECTOMY LAPAROSCOPIC;  Surgeon: Jamesetta So, MD;  Location: [facility];  Service: General;  Laterality: N/A;   MALONEY DILATION N/A 10/16/2013   Procedure: Keturah Shavers;  Surgeon: Daneil Dolin, MD;  [facility];  Service: Endoscopy;  Laterality: N/ASol Passer PLACEMENT  [id number]   Rt   IJ  Port-A-Cath  dr.burney   STERNAL WIRES REMOVAL  01/23/2012   Procedure: STERNAL WIRES REMOVAL;  Surgeon: Grace Isaac, MD;  Location: [place];  Service: Thoracic;  Laterality: N/A;   THORACOTOMY  [id number]   transsmanubrial anterolaterl thoracotomy with chest wall resection includinf the first rib and left  upper lobectomy for Pancoast turmor   TUBAL LIGATION     Past Medical History:  Diagnosis Date   Anxiety     Arthritis    Constipation 02/17/2015   Depression, recurrent ([place]) 07/24/2020   Emphysema lung ([place])    Fibromyalgia    GERD (gastroesophageal reflux disease)    Hypertension    Lung cancer ([place]) 2012   pancoast tumor   Menopausal vaginal dryness 02/17/2015   Ht '5\' 4"'$  (1.626 m)   Wt 142 lb (64.4 kg)   BMI 24.37 kg/m   Opioid Risk Score:   Fall Risk Score:  `1  Depression screen [facility] 2/9     03/23/2022    9:32 AM 02/16/2022   10:14 AM 01/26/2022   10:34 AM 01/03/2022  4:06 PM 09/28/2021    9:23 AM 06/02/2021   12:12 PM 11/09/2020    8:36 AM  Depression screen [facility] 2/9  Decreased Interest 0 0 0 0 0 1 0  Down, Depressed, Hopeless 0 0 0 0 0 1 0  PHQ - 2 Score 0 0 0 0 0 2 0  Altered sleeping  1 0 '2 1 1 1  '$ Tired, decreased energy  1 0 '1 1 1 1  '$ Change in appetite  0 0 1 0 0 0  Feeling bad or failure about yourself   0 0 0 0 0 0  Trouble concentrating  0 0 0 0 1 0  Moving slowly or fidgety/restless  0 0 0 0 1 0  Suicidal thoughts  0 0  0 0 0  PHQ-9 Score  2 0 '4 2 6 2  '$ Difficult doing work/chores  Somewhat difficult Not difficult at all Not difficult at all Not difficult at all Somewhat difficult Not difficult at all         Family History  Problem Relation Age of Onset   Cancer Mother        lung   Hypertension Father    Stroke Father    Hypertension Sister    Heart disease Sister    Cancer Brother 86       pancreatic   Colon cancer Neg Hx    Breast cancer Neg Hx    Social History   Socioeconomic History   Marital status: Married    Spouse name: Not on file   Number of children: Not on file   Years of education: Not on file   Highest education level: Not on file  Occupational History   Not on file  Tobacco Use   Smoking status: Former    Packs/day: 1.50    Years: 35.00    Total pack years: 52.50    Types: Cigarettes    Quit date: 02/04/2010    Years since quitting: 12.1   Smokeless tobacco: Never   Tobacco comments:    Quit x 8 years  Vaping Use    Vaping Use: Never used  Substance and Sexual Activity   Alcohol use: Yes    Alcohol/week: 0.0 standard drinks of alcohol    Comment: occasionally    Drug use: No   Sexual activity: Yes    Birth control/protection: Post-menopausal  Other Topics Concern   Not on file  Social History Narrative   Not on file   Social Determinants of Health   Financial Resource Strain: Not on file  Food Insecurity: Not on file  Transportation Needs: Not on file  Physical Activity: Not on file  Stress: Not on file  Social Connections: Not on file   Past Surgical History:  Procedure Laterality Date   CHOLECYSTECTOMY N/A 04/01/2016   Procedure: LAPAROSCOPIC CHOLECYSTECTOMY;  Surgeon: Aviva Signs, MD;  [facility];  Service: General;  Laterality: N/A;   COLONOSCOPY  11/2010   normal colonoscopy from anal verge to ileocecal valve; no polyps, masses, AVMs, diverticula, or evidence of inflammation   ESOPHAGOGASTRODUODENOSCOPY N/A 10/16/2013   [id number] ring s/p dilation/2 cm HH. multiple 3 mm round and shallow erosions. 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 Hiatal hernia   LAPAROSCOPIC APPENDECTOMY N/A 06/21/2013   Procedure: APPENDECTOMY LAPAROSCOPIC;  Surgeon: Jamesetta So, MD;  Location: [facility];  Service: General;  Laterality: N/A;   MALONEY DILATION N/A 10/16/2013  Procedure: MALONEY DILATION;  Surgeon: Daneil Dolin, MD;  [facility];  Service: Endoscopy;  Laterality: N/ASol Passer PLACEMENT  [id number]   Rt   IJ  Port-A-Cath  dr.burney   STERNAL WIRES REMOVAL  01/23/2012   Procedure: STERNAL WIRES REMOVAL;  Surgeon: Grace Isaac, MD;  Location: [place];  Service: Thoracic;  Laterality: N/A;   THORACOTOMY  [id number]   transsmanubrial anterolaterl thoracotomy with chest wall resection includinf the first rib and left  upper lobectomy for Pancoast turmor   TUBAL LIGATION     Past  Surgical History:  Procedure Laterality Date   CHOLECYSTECTOMY N/A 04/01/2016   Procedure: LAPAROSCOPIC CHOLECYSTECTOMY;  Surgeon: Aviva Signs, MD;  [facility];  Service: General;  Laterality: N/A;   COLONOSCOPY  11/2010   normal colonoscopy from anal verge to ileocecal valve; no polyps, masses, AVMs, diverticula, or evidence of inflammation   ESOPHAGOGASTRODUODENOSCOPY N/A 10/16/2013   [id number] ring s/p dilation/2 cm HH. multiple 3 mm round and shallow erosions. 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 Hiatal hernia   LAPAROSCOPIC APPENDECTOMY N/A 06/21/2013   Procedure: APPENDECTOMY LAPAROSCOPIC;  Surgeon: Jamesetta So, MD;  Location: [facility];  Service: General;  Laterality: N/A;   MALONEY DILATION N/A 10/16/2013   Procedure: Keturah Shavers;  Surgeon: Daneil Dolin, MD;  [facility];  Service: Endoscopy;  Laterality: N/ASol Passer PLACEMENT  [id number]   Rt   IJ  Port-A-Cath  dr.burney   STERNAL WIRES REMOVAL  01/23/2012   Procedure: STERNAL WIRES REMOVAL;  Surgeon: Grace Isaac, MD;  Location: [place];  Service: Thoracic;  Laterality: N/A;   THORACOTOMY  [id number]   transsmanubrial anterolaterl thoracotomy with chest wall resection includinf the first rib and left  upper lobectomy for Pancoast turmor   TUBAL LIGATION     Past Medical History:  Diagnosis Date   Anxiety    Arthritis    Constipation 02/17/2015   Depression, recurrent ([place]) 07/24/2020   Emphysema lung ([place])    Fibromyalgia    GERD (gastroesophageal reflux disease)    Hypertension    Lung cancer ([place]) 2012   pancoast tumor   Menopausal vaginal dryness 02/17/2015   BP (!) 138/91   Pulse 63   Ht '5\' 4"'$  (1.626 m)   Wt 142 lb (64.4 kg)   SpO2 95%   BMI 24.37 kg/m   Opioid Risk Score:   Fall Risk Score:  `1  Depression screen [facility] 2/9     03/23/2022    9:32 AM 02/16/2022   10:14 AM 01/26/2022   10:34 AM  01/03/2022    4:06 PM 09/28/2021    9:23 AM 06/02/2021   12:12 PM 11/09/2020    8:36 AM  Depression screen PHQ 2/9  Decreased Interest 0 0 0 0 0 1 0  Down, Depressed, Hopeless 0 0 0 0 0 1 0  PHQ - 2 Score 0 0 0 0 0 2 0  Altered sleeping  1 0 '2 1 1 1  '$ Tired, decreased energy  1 0 '1 1 1 1  '$ Change in appetite  0 0 1 0 0 0  Feeling bad or failure about yourself   0 0 0 0 0 0  Trouble concentrating  0 0 0 0 1 0  Moving slowly or fidgety/restless  0 0 0 0 1 0  Suicidal thoughts  0 0  0 0 0  PHQ-9 Score  2 0 '4 2 6 2  '$ Difficult doing work/chores  Somewhat difficult Not difficult at all Not difficult at all Not difficult at all Somewhat difficult Not difficult at all      Review of Systems  Musculoskeletal:  Positive for back pain.  All other systems reviewed and are negative.      Objective:   Physical Exam   Constitutional: No apparent distress. Appropriate appearance for age.  HENT: No JVD. Neck Supple. Trachea midline. Atraumatic, normocephalic. Eyes: PERRLA. EOMI. Visual fields grossly intact.  Cardiovascular: RRR, no murmurs/rub/gallops. No Edema. Peripheral pulses 2+  Respiratory: CTAB. No rales, rhonchi, or wheezing. On RA.  Skin: C/D/I. No apparent lesions. MSK:  + TTP L PSIS, SI joint, R ischial tuberostiy + FABER, FADIR b/l; negative over SI tests Improved pain when bending forward       Strength:                RUE: 5/5 SA, 5/5 EF, 5/5 EE, 5/5 WE, 5/5 FF, 5/5 FA                 LUE: 5/5 SA, 5/5 EF, 5/5 EE, 5/5 WE, 5/5 FF, 5/5 FA                 RLE: 4/5 HF, 5/5 KE, 5/5 DF, 5/5 EHL, 5/5 PF                 LLE:  4/5 HF, 5/5 KE, 5/5 DF, 5/5 EHL, 5/5 PF    Neurologic exam:  Cognition: AAO to person, place, time and event.  Sensation: To light touch intact in BL UEs and LEs  Reflexes: 2+ in BL LEs.   Coordination: No apparent tremors. No ataxia on FTN, HTS bilaterally.  Spasticity: MAS 0 in all extremities.  Gait: Normal stance and stride.       Assessment & Plan:    [name] is a 62 y.o. year old female  who  has a past medical history of Anxiety, Arthritis, Constipation (02/17/2015), Depression, recurrent ([place]) (07/24/2020), Emphysema lung ([place]), Fibromyalgia, GERD (gastroesophageal reflux disease), Hypertension, Lung cancer ([place]) (2012), and Menopausal vaginal dryness (02/17/2015).   They are presenting to PM&R clinic as a follow up for chronic back pain.   Chronic low back pain with sciatica, sciatica laterality unspecified, unspecified back pain laterality Assessment & Plan: I have also referred you to physical therapy in [place], with preference to [place] for your back pain, to work on pain management strategies, endurance, and core strengthening to help support your back.  Try to minimize the use of back braces is much as possible, so that you do not develop abdominal weakness and dependence on these.  I will see back in 1 month  Orders: -     Ambulatory referral to Physical Therapy  Chronic pain syndrome Assessment & Plan: Today, we disposed of your extra tablets of Norco 10 mg and are switching you to Norco 5 mg, number 30 tablets.  Take 1 to half a tab up to twice a day for your back pain.  Call the clinic if you run out of medication early, and follow-up with me in 1 month for further refills.  Indication for chronic opioid: Chronic back pain  Medication and dose: Norco 10 mg Q6H PRN -> Norco 5 mg 0.5-1 tab BID PRN  # pills per month: 30  Last UDS date: 02/16/22 Opioid Treatment Agreement signed (Y/N): Y, 02/16/22 Opioid Treatment Agreement  last reviewed with patient:   NCCSRS/PDMP reviewed this encounter (include red flags): Yes    Management will include: Medium Risk (10-90 MME) UDS every 3-6 months NCCSR check every visit Follow up Q1M initially, Q3M once established   Encounter for long-term opiate analgesic use  Other orders -     HYDROcodone-Acetaminophen; Take 0.5-1 tablets by mouth 2 (two) times daily as needed for  moderate pain.  Dispense: 30 tablet; Refill: 0

## 2022-03-24 ENCOUNTER — Encounter: Payer: Self-pay | Admitting: Nurse Practitioner

## 2022-03-24 ENCOUNTER — Other Ambulatory Visit: Payer: Self-pay | Admitting: Family

## 2022-03-24 DIAGNOSIS — I10 Essential (primary) hypertension: Secondary | ICD-10-CM

## 2022-03-24 NOTE — Telephone Encounter (Signed)
Left message to schedule appt for [name] in 30 days and will mail letter

## 2022-03-24 NOTE — Telephone Encounter (Signed)
Je pt NTBS by new provider 30 days given today

## 2022-03-28 ENCOUNTER — Ambulatory Visit: Payer: 59 | Admitting: Nurse Practitioner

## 2022-03-28 NOTE — Assessment & Plan Note (Signed)
Today, we disposed of your extra tablets of Norco 10 mg and are switching you to Norco 5 mg, number 30 tablets.  Take 1 to half a tab up to twice a day for your back pain.  Call the clinic if you run out of medication early, and follow-up with me in 1 month for further refills.  Indication for chronic opioid: Chronic back pain  Medication and dose: Norco 10 mg Q6H PRN -> Norco 5 mg 0.5-1 tab BID PRN  # pills per month: 30  Last UDS date: 02/16/22 Opioid Treatment Agreement signed (Y/N): Y, 02/16/22 Opioid Treatment Agreement last reviewed with patient:   NCCSRS/PDMP reviewed this encounter (include red flags): Yes    Management will include: Medium Risk (10-90 MME) UDS every 3-6 months NCCSR check every visit Follow up Q1M initially, Q3M once established

## 2022-03-28 NOTE — Assessment & Plan Note (Signed)
I have also referred you to physical therapy in [place], with preference to [place] for your back pain, to work on pain management strategies, endurance, and core strengthening to help support your back.  Try to minimize the use of back braces is much as possible, so that you do not develop abdominal weakness and dependence on these.  I will see back in 1 month

## 2022-04-26 ENCOUNTER — Ambulatory Visit: Payer: 59 | Admitting: Physical Therapy

## 2022-04-26 ENCOUNTER — Other Ambulatory Visit: Payer: Self-pay | Admitting: Family Medicine

## 2022-04-26 DIAGNOSIS — I10 Essential (primary) hypertension: Secondary | ICD-10-CM

## 2022-04-27 ENCOUNTER — Encounter: Payer: 59 | Admitting: Physical Medicine and Rehabilitation

## 2022-05-03 ENCOUNTER — Encounter: Payer: Self-pay | Admitting: Physical Therapy

## 2022-05-03 ENCOUNTER — Encounter: Payer: 59 | Admitting: Physical Therapy

## 2022-05-03 ENCOUNTER — Other Ambulatory Visit: Payer: Self-pay

## 2022-05-03 ENCOUNTER — Ambulatory Visit: Payer: 59 | Attending: Physical Medicine and Rehabilitation | Admitting: Physical Therapy

## 2022-05-03 DIAGNOSIS — M62830 Muscle spasm of back: Secondary | ICD-10-CM

## 2022-05-03 DIAGNOSIS — M5440 Lumbago with sciatica, unspecified side: Secondary | ICD-10-CM | POA: Insufficient documentation

## 2022-05-03 DIAGNOSIS — M5459 Other low back pain: Secondary | ICD-10-CM | POA: Diagnosis not present

## 2022-05-03 DIAGNOSIS — R293 Abnormal posture: Secondary | ICD-10-CM | POA: Insufficient documentation

## 2022-05-03 DIAGNOSIS — G8929 Other chronic pain: Secondary | ICD-10-CM | POA: Insufficient documentation

## 2022-05-03 NOTE — Therapy (Signed)
OUTPATIENT PHYSICAL THERAPY THORACOLUMBAR EVALUATION   Patient Name: [name] MRN: [id number] DOB:1960/02/19, 62 y.o., female Today's Date: 05/03/2022  END OF SESSION:  PT End of Session - 05/03/22 1049     Visit Number 1    Number of Visits 12    Date for PT Re-Evaluation 08/01/22    Authorization Type FOTO AT LEAST EVERY 5TH VISIT.  PROGRESS NOTE AT 10TH VISIT.  KX MODIFIER AFTER 15 VISITS.    PT Start Time [phone]    PT Stop Time 1040    PT Time Calculation (min) 49 min    Activity Tolerance Patient tolerated treatment well    Behavior During Therapy [facility] for tasks assessed/performed             Past Medical History:  Diagnosis Date   Anxiety    Arthritis    Constipation 02/17/2015   Depression, recurrent 07/24/2020   Emphysema lung    Fibromyalgia    GERD (gastroesophageal reflux disease)    Hypertension    Lung cancer 2012   pancoast tumor   Menopausal vaginal dryness 02/17/2015   Past Surgical History:  Procedure Laterality Date   CHOLECYSTECTOMY N/A 04/01/2016   Procedure: LAPAROSCOPIC CHOLECYSTECTOMY;  Surgeon: Franky Macho, MD;  [facility];  Service: General;  Laterality: N/A;   COLONOSCOPY  11/2010   normal colonoscopy from anal verge to ileocecal valve; no polyps, masses, AVMs, diverticula, or evidence of inflammation   ESOPHAGOGASTRODUODENOSCOPY N/A 10/16/2013   HYI:[id number] ring s/p dilation/2 cm HH. multiple 3 mm round and shallow erosions. 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 Hiatal hernia   LAPAROSCOPIC APPENDECTOMY N/A 06/21/2013   Procedure: APPENDECTOMY LAPAROSCOPIC;  Surgeon: Dalia Heading, MD;  Location[facility];  Service: General;  Laterality: N/A;   MALONEY DILATION N/A 10/16/2013   Procedure: Alvy Beal;  Surgeon: Corbin Ade, MD;  [facility];  Service: Endoscopy;  Laterality: N/ATod [place] PLACEMENT  [id number]   Rt   IJ   Port-A-Cath  dr.burney   STERNAL WIRES REMOVAL  01/23/2012   Procedure: STERNAL WIRES REMOVAL;  Surgeon: Delight Ovens, MD;  Location: [facility] OR;  Service: Thoracic;  Laterality: N/A;   THORACOTOMY  [id number]   transsmanubrial anterolaterl thoracotomy with chest wall resection includinf the first rib and left  upper lobectomy for Pancoast turmor   TUBAL LIGATION     Patient Active Problem List   Diagnosis Date Noted   Chronic pain syndrome 02/16/2022   Chronic low back pain with sciatica 02/16/2022   Annual physical exam 04/26/2021   Insomnia 01/11/2021   Depression, recurrent 07/24/2020   Controlled substance agreement signed 05/25/2020   Simple chronic bronchitis 05/10/2019   Elevated LFTs 03/20/2019   Neuropathy 09/27/2016   DDD (degenerative disc disease), cervical 12/08/2015   History of lung cancer 12/08/2015   Cervical disc disorder with radiculopathy of cervical region 09/23/2015   Gastroesophageal reflux disease without esophagitis 03/19/2015   Menopausal vaginal dryness 02/17/2015   Constipation 02/17/2015   Esophageal dysphagia 09/25/2013   Hiccups 04/19/2013   Unspecified constipation 04/18/2013   Anxiety    Hypertension     REFERRING PROVIDER: Elijah Birk  REFERRING DIAG: Chronic low back pain with Sciatica.  Rationale for Evaluation and Treatment: Rehabilitation  THERAPY DIAG:  Other low back pain  Muscle spasm of back  Abnormal posture  ONSET DATE: Many years.  SUBJECTIVE:  SUBJECTIVE STATEMENT: The patient presents to the clinic with c/o low back pain with radiation on pain into her left buttocks and posterior thigh and sometimes occurring on the right.  Her pain at rest today is a 4/10 but can rise to near a 10/10 with increased activity.  For example, she states if  she does yardwork she can barely get out of bed in the morning.  Cold, heat, hot showers and medication and some ESI have been helpful.  Stand and sitting increases her pain.  Really, "too much of anything" increases her pain.  PERTINENT HISTORY:  Fibromyalgia, HTN.    PAIN:  Are you having pain? Yes: NPRS scale: 4/10 Pain location: Left low back. Pain description: Ache, shoot. Aggravating factors: See above. Relieving factors: See above.  PRECAUTIONS: None  WEIGHT BEARING RESTRICTIONS: No  FALLS:  Has patient fallen in last 6 months? No  LIVING ENVIRONMENT: Lives with: lives with their spouse Lives in: House/apartment Stairs: Yes. 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  PLOF: Independent  PATIENT GOALS: Better quality of life and more energy.  OBJECTIVE:    PATIENT SURVEYS:  FOTO 53.   POSTURE: posterior pelvic tilt, decreased lumbar lordosis.  PALPATION: Tender to palpation and taut over left lumbar musculature.  LUMBAR ROM:   Full active lumbar flexion and extension to 15 degrees with pain increase.  LOWER EXTREMITY ROM:     WNL.  LOWER EXTREMITY MMT:    Normal LE strength.  LUMBAR SPECIAL TESTS:  Equal leg lengths.  (-) SLR testing.  GAIT: WNL.  TODAY'S TREATMENT:                                                                                                                              DATE: HMP and IFC at 80-150 Hz on 40% scan x 15 minutes to patient's left lumbar region.    Patient will benefit from skilled physical therapy intervention to address pain and deficits.  ASSESSMENT:  CLINICAL IMPRESSION: The patient presents to OPPT with c/o chronic low back pain with radiation into her left buttock and posterior thigh.  Her pain increases as her activity levels increase.  Her left lumbar musculature is tender and taut to palpation.  LE DTR's are normal as is her strength.  Special testing was negative.  She  is limited into active lumbar extension which is painful.  Patient will benefit from skilled physical therapy intervention to address pain and deficits.  OBJECTIVE IMPAIRMENTS: decreased activity tolerance, decreased ROM, increased muscle spasms, postural dysfunction, and pain.   ACTIVITY LIMITATIONS: carrying, lifting, bending, sitting, and standing  PARTICIPATION LIMITATIONS: meal prep, cleaning, laundry, and yard work  PERSONAL FACTORS: Time since onset of injury/illness/exacerbation and 1 comorbidity: Fibromyalgia  are also affecting patient's functional outcome.   REHAB POTENTIAL: Good  CLINICAL DECISION MAKING: Stable/uncomplicated  EVALUATION COMPLEXITY: Low   GOALS:  SHORT TERM GOALS: Target date: 05/17/22  Ind with an initial HEP. Goal status: INITIAL   LONG TERM GOALS: Target date: 08/01/22.  Ind with an advanced HEP.  Goal status: INITIAL  2.  Perform ADL's with pain not > 4/10.  Goal status: INITIAL  3.  Eliminate left LE pain.  Goal status: INITIAL  PLAN:  PT FREQUENCY: 2x/week  PT DURATION: 6 weeks  PLANNED INTERVENTIONS: Therapeutic exercises, Therapeutic activity, Patient/Family education, Self Care, Dry Needling, Electrical stimulation, Cryotherapy, Moist heat, Ultrasound, and Manual therapy.  PLAN FOR NEXT SESSION: Modalities as needed.  STW/M to left lumbar musculature.  Core exercise progression.  Body mechanics training and spinal protection techniques.   Reyce Lubeck, [place], PT 05/03/2022, 12:15 PM

## 2022-05-10 ENCOUNTER — Ambulatory Visit: Payer: 59 | Admitting: *Deleted

## 2022-05-10 ENCOUNTER — Encounter: Payer: Self-pay | Admitting: *Deleted

## 2022-05-10 DIAGNOSIS — G8929 Other chronic pain: Secondary | ICD-10-CM | POA: Diagnosis not present

## 2022-05-10 DIAGNOSIS — M62830 Muscle spasm of back: Secondary | ICD-10-CM

## 2022-05-10 DIAGNOSIS — R293 Abnormal posture: Secondary | ICD-10-CM

## 2022-05-10 DIAGNOSIS — M5459 Other low back pain: Secondary | ICD-10-CM | POA: Diagnosis not present

## 2022-05-10 DIAGNOSIS — M5440 Lumbago with sciatica, unspecified side: Secondary | ICD-10-CM | POA: Diagnosis not present

## 2022-05-10 NOTE — Therapy (Signed)
OUTPATIENT PHYSICAL THERAPY THORACOLUMBAR TREATMENT   Patient Name: [name] MRN: [id number] DOB:24-Oct-1960, 62 y.o., female Today's Date: 05/10/2022  END OF SESSION:  PT End of Session - 05/10/22 0954     Visit Number 2    Number of Visits 12    Date for PT Re-Evaluation 08/01/22    Authorization Type FOTO AT LEAST EVERY 5TH VISIT.  PROGRESS NOTE AT 10TH VISIT.  KX MODIFIER AFTER 15 VISITS.    PT Start Time 0953    PT Stop Time 1042    PT Time Calculation (min) 49 min             Past Medical History:  Diagnosis Date   Anxiety    Arthritis    Constipation 02/17/2015   Depression, recurrent 07/24/2020   Emphysema lung    Fibromyalgia    GERD (gastroesophageal reflux disease)    Hypertension    Lung cancer 2012   pancoast tumor   Menopausal vaginal dryness 02/17/2015   Past Surgical History:  Procedure Laterality Date   CHOLECYSTECTOMY N/A 04/01/2016   Procedure: LAPAROSCOPIC CHOLECYSTECTOMY;  Surgeon: Franky Macho, MD;  Location: [facility];  Service: General;  Laterality: N/A;   COLONOSCOPY  11/2010   normal colonoscopy from anal verge to ileocecal valve; no polyps, masses, AVMs, diverticula, or evidence of inflammation   ESOPHAGOGASTRODUODENOSCOPY N/A 10/16/2013   XBM:[id number] ring s/p dilation/2 cm HH. multiple 3 mm round and shallow erosions. 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 Hiatal hernia   LAPAROSCOPIC APPENDECTOMY N/A 06/21/2013   Procedure: APPENDECTOMY LAPAROSCOPIC;  Surgeon: Dalia Heading, MD;  Location: [facility];  Service: General;  Laterality: N/A;   MALONEY DILATION N/A 10/16/2013   Procedure: Alvy Beal;  Surgeon: Corbin Ade, MD;  Location: [facility];  Service: Endoscopy;  Laterality: N/ATod [place] PLACEMENT  [id number]   Rt   IJ  Port-A-Cath  dr.burney   STERNAL WIRES REMOVAL  01/23/2012   Procedure: STERNAL WIRES REMOVAL;  Surgeon: Delight Ovens, MD;  Location: [facility] OR;  Service: Thoracic;  Laterality: N/A;   THORACOTOMY  [id number]   transsmanubrial anterolaterl thoracotomy with chest wall resection includinf the first rib and left  upper lobectomy for Pancoast turmor   TUBAL LIGATION     Patient Active Problem List   Diagnosis Date Noted   Chronic pain syndrome 02/16/2022   Chronic low back pain with sciatica 02/16/2022   Annual physical exam 04/26/2021   Insomnia 01/11/2021   Depression, recurrent 07/24/2020   Controlled substance agreement signed 05/25/2020   Simple chronic bronchitis 05/10/2019   Elevated LFTs 03/20/2019   Neuropathy 09/27/2016   DDD (degenerative disc disease), cervical 12/08/2015   History of lung cancer 12/08/2015   Cervical disc disorder with radiculopathy of cervical region 09/23/2015   Gastroesophageal reflux disease without esophagitis 03/19/2015   Menopausal vaginal dryness 02/17/2015   Constipation 02/17/2015   Esophageal dysphagia 09/25/2013   Hiccups 04/19/2013   Unspecified constipation 04/18/2013   Anxiety    Hypertension     REFERRING PROVIDER: Elijah Birk  REFERRING DIAG: Chronic low back pain with Sciatica.  Rationale for Evaluation and Treatment: Rehabilitation  THERAPY DIAG:  Other low back pain  Muscle spasm of back  Abnormal posture  ONSET DATE: Many years.  SUBJECTIVE:  SUBJECTIVE STATEMENT: The patient presents to the clinic with c/o low back pain with radiation on pain into her left buttocks and posterior thigh and sometimes occurring on the right.  Her pain at rest today is a 4/10 but can rise to near a 10/10 with increased activity.  Did okay after Eval PERTINENT HISTORY:  Fibromyalgia, HTN.    PAIN:  Are you having pain? Yes: NPRS scale: 4/10 Pain location: Left low  back. Pain description: Ache, shoot. Aggravating factors: See above. Relieving factors: See above.  PRECAUTIONS: None  WEIGHT BEARING RESTRICTIONS: No  FALLS:  Has patient fallen in last 6 months? No  LIVING ENVIRONMENT: Lives with: lives with their spouse Lives in: House/apartment Stairs: Yes. Has following equipment at home: None  OCCUPATION: Works a a Psychologist, educational.  PLOF: Independent  PATIENT GOALS: Better quality of life and more energy.  OBJECTIVE:    PATIENT SURVEYS:  FOTO 53.   POSTURE: posterior pelvic tilt, decreased lumbar lordosis.  PALPATION: Tender to palpation and taut over left lumbar musculature.  LUMBAR ROM:   Full active lumbar flexion and extension to 15 degrees with pain increase.  LOWER EXTREMITY ROM:     WNL.  LOWER EXTREMITY MMT:    Normal LE strength.  LUMBAR SPECIAL TESTS:  Equal leg lengths.  (-) SLR testing.  GAIT: WNL.  TODAY'S TREATMENT:                                                                                                                              DATE:                                                               4-16  Discussed pain triggers and ADL's and movement patterns to decrease pain with ADL's and activities. AB bracing x10 hold 5 secs performed. Handout given for HEP. HMP and IFC at 80-150 Hz on 40% scan x 15 minutes to patient's left lumbar region.      ASSESSMENT:  CLINICAL IMPRESSION: The patient presents to OPPT with c/o chronic low back pain with radiation into her left buttock and posterior thigh. Today's Rx focused on AB bracing and movement patterns as well as modifications to assist Pt with ADL's and ways for Pt to avoid daily pain triggers and decrease LB sensitivity.IFC and HMP end of session   OBJECTIVE IMPAIRMENTS: decreased activity tolerance, decreased ROM, increased muscle spasms, postural dysfunction, and pain.   ACTIVITY LIMITATIONS: carrying, lifting, bending, sitting, and  standing  PARTICIPATION LIMITATIONS: meal prep, cleaning, laundry, and yard work  PERSONAL FACTORS: Time since onset of injury/illness/exacerbation and 1 comorbidity: Fibromyalgia  are also affecting patient's functional outcome.   REHAB POTENTIAL: Good  CLINICAL DECISION MAKING: Stable/uncomplicated  EVALUATION  COMPLEXITY: Low   GOALS:  SHORT TERM GOALS: Target date: 05/17/22  Ind with an initial HEP. Goal status: INITIAL   LONG TERM GOALS: Target date: 08/01/22.  Ind with an advanced HEP.  Goal status: INITIAL  2.  Perform ADL's with pain not > 4/10.  Goal status: INITIAL  3.  Eliminate left LE pain.  Goal status: INITIAL  PLAN:  PT FREQUENCY: 2x/week  PT DURATION: 6 weeks  PLANNED INTERVENTIONS: Therapeutic exercises, Therapeutic activity, Patient/Family education, Self Care, Dry Needling, Electrical stimulation, Cryotherapy, Moist heat, Ultrasound, and Manual therapy.  PLAN FOR NEXT SESSION: Modalities as needed.  STW/M to left lumbar musculature.  Core exercise progression.  Body mechanics training and spinal protection techniques.   Awilda Covin,CHRIS, PTA 05/10/2022, 12:54 PM

## 2022-05-11 ENCOUNTER — Encounter: Payer: 59 | Attending: Physical Medicine and Rehabilitation | Admitting: Physical Medicine and Rehabilitation

## 2022-05-11 VITALS — Ht 64.0 in | Wt 136.0 lb

## 2022-05-11 DIAGNOSIS — G8929 Other chronic pain: Secondary | ICD-10-CM | POA: Diagnosis not present

## 2022-05-11 DIAGNOSIS — M5440 Lumbago with sciatica, unspecified side: Secondary | ICD-10-CM | POA: Diagnosis not present

## 2022-05-11 DIAGNOSIS — G894 Chronic pain syndrome: Secondary | ICD-10-CM

## 2022-05-11 DIAGNOSIS — M25512 Pain in left shoulder: Secondary | ICD-10-CM | POA: Diagnosis not present

## 2022-05-11 MED ORDER — HYDROCODONE-ACETAMINOPHEN 5-325 MG PO TABS: 0.5000 | ORAL_TABLET | Freq: Every day | ORAL | 0 refills | Status: DC | PRN

## 2022-05-11 NOTE — Progress Notes (Signed)
Subjective:    Patient ID: [name], female    DOB: 09/26/1960, 62 y.o.   MRN: [id number]  HPI   [name] is a 62 y.o. year old female  who  has a past medical history of Anxiety, Arthritis, Constipation (02/17/2015), Depression, recurrent (HCC) (07/24/2020), Emphysema lung (HCC), Fibromyalgia, GERD (gastroesophageal reflux disease), Hypertension, Lung cancer (HCC) (2012), and Menopausal vaginal dryness (02/17/2015).   They are presenting to PM&R clinic as a follow up for chronic back pain.     Plan from last visit: Chronic low back pain with sciatica, sciatica laterality unspecified, unspecified back pain laterality Assessment & Plan: I have also referred you to physical therapy in [place], with preference to [facility] for your back pain, to work on pain management strategies, endurance, and core strengthening to help support your back.  Try to minimize the use of back braces is much as possible, so that you do not develop abdominal weakness and dependence on these.   I will see back in 1 month   Orders: -     Ambulatory referral to Physical Therapy   Chronic pain syndrome Assessment & Plan: Today, we disposed of your extra tablets of Norco 10 mg and are switching you to Norco 5 mg, number 30 tablets.  Take 1 to half a tab up to twice a day for your back pain.  Call the clinic if you run out of medication early, and follow-up with me in 1 month for further refills.   Indication for chronic opioid: Chronic back pain  Medication and dose: Norco 10 mg Q6H PRN -> Norco 5 mg 0.5-1 tab BID PRN  # pills per month: 30  Last UDS date: 02/16/22 Opioid Treatment Agreement signed (Y/N): Y, 02/16/22 Opioid Treatment Agreement last reviewed with patient:   NCCSRS/PDMP reviewed this encounter (include red flags): Yes    Management will include: Medium Risk (10-90 MME) UDS every 3-6 months NCCSR check every visit Follow up Q1M initially, Q3M once established     Encounter for  long-term opiate analgesic use   Other orders -     HYDROcodone-Acetaminophen; Take 0.5-1 tablets by mouth 2 (two) times daily as needed for moderate pain.  Dispense: 30 tablet; Refill: 0   Interval Hx:  - Therapies: Has been in PT for 2 weeks. Going well. She has been trying to be more mindful of how she gets in and out of cars. Has also been working in her garden a lot, which makes her sore.   She notes that her pain has been much better in the lower extremities, especially the hamstrings. She does want PT to address her L shoulder; it has not been more painful recently but is chronically painful. Has had no injections or treatments other than heat and TENs. Did have prior cancer surgery there; see MRI 09/04/2020.    - Follow ups: none; her PCP left her practice recently and she is establishing with a new provider tomorrow.    - Falls: none   - DME: Has been using TENs unit with PT which has been helpful; she has one at home she has used a few times with temporary benefit.   She's trying to be better about not wearing back brace, and working on core strengthening.    - Medications: Has been using a 1/2 tab of norco PRN only; uses ice and heat first, and will take a walk to try and helo. She will never take more than 1/2 a  tablet in a day, usually at lunch and then usually lasts until bedtime. She denies side effects.    - Other concerns: Has been better about asking for help when she needs it. Also thinks warmer weather helps quite a bit.    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 This helps her rest enough so that her pain and anxiety are much better controlled.   Pain Inventory Average Pain 5 Pain Right Now 6 My pain is burning and aching  In the last 24 hours, has pain interfered with the following? General activity 3 Relation with others 4 Enjoyment of life 4 What TIME of day is your pain at its worst?  morning  and evening Sleep (in general) Good  Pain is worse with: sitting, inactivity, and standing Pain improves with: heat/ice, therapy/exercise, medication, and TENS Relief from Meds: 8  Family History  Problem Relation Age of Onset   Cancer Mother        lung   Hypertension Father    Stroke Father    Hypertension Sister    Heart disease Sister    Cancer Brother 13       pancreatic   Colon cancer Neg Hx    Breast cancer Neg Hx    Social History   Socioeconomic History   Marital status: Married    Spouse name: Not on file   Number of children: Not on file   Years of education: Not on file   Highest education level: 12th grade  Occupational History   Not on file  Tobacco Use   Smoking status: Former    Packs/day: 1.50    Years: 35.00    Additional pack years: 0.00    Total pack years: 52.50    Types: Cigarettes    Quit date: 02/04/2010    Years since quitting: 12.2   Smokeless tobacco: Never   Tobacco comments:    Quit x 8 years  Vaping Use   Vaping Use: Never used  Substance and Sexual Activity   Alcohol use: Yes    Alcohol/week: 0.0 standard drinks of alcohol    Comment: occasionally    Drug use: No   Sexual activity: Yes    Birth control/protection: Post-menopausal  Other Topics Concern   Not on file  Social History Narrative   Not on file   Social Determinants of Health   Financial Resource Strain: Low Risk  (05/08/2022)   Overall Financial Resource Strain (CARDIA)    Difficulty of Paying Living Expenses: Not hard at all  Food Insecurity: No Food Insecurity (05/08/2022)   Hunger Vital Sign    Worried About Running Out of Food in the Last Year: Never true    Ran Out of Food in the Last Year: Never true  Transportation Needs: No Transportation Needs (05/08/2022)   PRAPARE - Administrator, Civil Service (Medical): No    Lack of Transportation (Non-Medical): No  Physical Activity: Unknown (05/08/2022)   Exercise Vital Sign    Days of  Exercise per Week: Patient declined    Minutes of Exercise per Session: Not on file  Stress: No Stress Concern Present (05/08/2022)   [employer] of Occupational Health - Occupational Stress Questionnaire    Feeling of Stress : Not at all  Social Connections: Socially Integrated (05/08/2022)   Social Connection and Isolation Panel [NHANES]    Frequency of Communication  with Friends and Family: More than three times a week    Frequency of Social Gatherings with Friends and Family: More than three times a week    Attends Religious Services: More than 4 times per year    Active Member of Clubs or Organizations: Yes    Attends Banker Meetings: More than 4 times per year    Marital Status: Married   Past Surgical History:  Procedure Laterality Date   CHOLECYSTECTOMY N/A 04/01/2016   Procedure: LAPAROSCOPIC CHOLECYSTECTOMY;  Surgeon: Franky Macho, MD;  Location: [facility];  Service: General;  Laterality: N/A;   COLONOSCOPY  11/2010   normal colonoscopy from anal verge to ileocecal valve; no polyps, masses, AVMs, diverticula, or evidence of inflammation   ESOPHAGOGASTRODUODENOSCOPY N/A 10/16/2013   ZOX:[id number] ring s/p dilation/2 cm HH. multiple 3 mm round and shallow erosions. 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 Hiatal hernia   LAPAROSCOPIC APPENDECTOMY N/A 06/21/2013   Procedure: APPENDECTOMY LAPAROSCOPIC;  Surgeon: Dalia Heading, MD;  Location: [facility];  Service: General;  Laterality: N/A;   MALONEY DILATION N/A 10/16/2013   Procedure: Alvy Beal;  Surgeon: Corbin Ade, MD;  Location: [facility];  Service: Endoscopy;  Laterality: N/ATod [place] PLACEMENT  [id number]   Rt   IJ  Port-A-Cath  dr.burney   STERNAL WIRES REMOVAL  01/23/2012   Procedure: STERNAL WIRES REMOVAL;  Surgeon: Delight Ovens, MD;  Location: [facility];  Service: Thoracic;  Laterality: N/A;   THORACOTOMY   [id number]   transsmanubrial anterolaterl thoracotomy with chest wall resection includinf the first rib and left  upper lobectomy for Pancoast turmor   TUBAL LIGATION     Past Surgical History:  Procedure Laterality Date   CHOLECYSTECTOMY N/A 04/01/2016   Procedure: LAPAROSCOPIC CHOLECYSTECTOMY;  Surgeon: Franky Macho, MD;  Location: [facility];  Service: General;  Laterality: N/A;   COLONOSCOPY  11/2010   normal colonoscopy from anal verge to ileocecal valve; no polyps, masses, AVMs, diverticula, or evidence of inflammation   ESOPHAGOGASTRODUODENOSCOPY N/A 10/16/2013   YQM:VHQIONGE ring s/p dilation/2 cm HH. multiple 3 mm round and shallow erosions. 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 Hiatal hernia   LAPAROSCOPIC APPENDECTOMY N/A 06/21/2013   Procedure: APPENDECTOMY LAPAROSCOPIC;  Surgeon: Dalia Heading, MD;  Location: [facility];  Service: General;  Laterality: N/A;   MALONEY DILATION N/A 10/16/2013   Procedure: Alvy Beal;  Surgeon: Corbin Ade, MD;  [facility];  Service: Endoscopy;  Laterality: N/ATod [place] PLACEMENT  [id number]   Rt   IJ  Port-A-Cath  dr.burney   STERNAL WIRES REMOVAL  01/23/2012   Procedure: STERNAL WIRES REMOVAL;  Surgeon: Delight Ovens, MD;  Location: [facility];  Service: Thoracic;  Laterality: N/A;   THORACOTOMY  [id number]   transsmanubrial anterolaterl thoracotomy with chest wall resection includinf the first rib and left  upper lobectomy for Pancoast turmor   TUBAL LIGATION     Past Medical History:  Diagnosis Date   Anxiety    Arthritis    Constipation 02/17/2015   Depression, recurrent 07/24/2020   Emphysema lung    Fibromyalgia    GERD (gastroesophageal reflux disease)    Hypertension    Lung cancer 2012   pancoast tumor   Menopausal vaginal dryness 02/17/2015   There were no vitals taken for this visit.  Opioid  Risk Score:   Fall Risk Score:   `1  Depression screen [facility] 2/9     03/23/2022    9:32 AM 02/16/2022   10:14 AM 01/26/2022   10:34 AM 01/03/2022    4:06 PM 09/28/2021    9:23 AM 06/02/2021   12:12 PM 11/09/2020    8:36 AM  Depression screen PHQ 2/9  Decreased Interest 0 0 0 0 0 1 0  Down, Depressed, Hopeless 0 0 0 0 0 1 0  PHQ - 2 Score 0 0 0 0 0 2 0  Altered sleeping  1 0 2 1 1 1   Tired, decreased energy  1 0 1 1 1 1   Change in appetite  0 0 1 0 0 0  Feeling bad or failure about yourself   0 0 0 0 0 0  Trouble concentrating  0 0 0 0 1 0  Moving slowly or fidgety/restless  0 0 0 0 1 0  Suicidal thoughts  0 0  0 0 0  PHQ-9 Score  2 0 4 2 6 2   Difficult doing work/chores  Somewhat difficult Not difficult at all Not difficult at all Not difficult at all Somewhat difficult Not difficult at all     Review of Systems  Musculoskeletal:  Positive for back pain.       LT shoulder pain  All other systems reviewed and are negative.      Objective:   Physical Exam  Constitutional: No apparent distress. Appropriate appearance for age.  HENT: No JVD. Neck Supple. Trachea midline. Atraumatic, normocephalic. Eyes: PERRLA. EOMI. Visual fields grossly intact.  Cardiovascular: RRR, no murmurs/rub/gallops. No Edema. Peripheral pulses 2+  Respiratory: CTAB. No rales, rhonchi, or wheezing. On RA.  Skin: C/D/I. No apparent lesions.  MSK: + TTP L PSIS, SI joint, R ischial tuberostiy;  - Slump test + TTP L shoulder over coracoid process; +Pain with PROM abduction, extension + Mild Hawkins, apprehension; -Neer's, -Yeagarson's   Neurologic Exam:   DTRs: Reflexes were 2+ in bilateral achilles, patella, biceps, BR and triceps. Sensory exam: revealed normal sensation in all dermatomal regions in bilateral lower extremities Motor exam: strength 5/5 throughout bilateral upper extremities and with exception of 4/5 bilateral hip flexors; otherwise 5/5 Coordination: Fine motor coordination was normal.   Gait: normal    Assessment &  Plan:   [name] is a 62 y.o. year old female  who  has a past medical history of Anxiety, Arthritis, Constipation (02/17/2015), Depression, recurrent (07/24/2020), Emphysema lung, Fibromyalgia, GERD (gastroesophageal reflux disease), Hypertension, Lung cancer (2012), and Menopausal vaginal dryness (02/17/2015).   They are presenting to PM&R clinic as a new patient for treatment of chronic back pain.   Chronic pain syndrome Indication for chronic opioid: Chronic back pain  Medication and dose: Norco 10 mg Q6H PRN -> Norco 5 mg 0.5-1 tab BID PRN  # pills per month: 30  Last UDS date: 02/16/22 Opioid Treatment Agreement signed (Y/N): Y, 02/16/22 Opioid Treatment Agreement last reviewed with patient:   NCCSRS/PDMP reviewed this encounter (include red flags): Yes    Management will include: Low Risk (10-90 MME) UDS every 6-12 months Follow up Q6M   Chronic low back pain with sciatica, sciatica laterality unspecified, unspecified back pain laterality I will renew your prescriptions for Norco 5 mg half a tab to 1 tablet daily number 30 tablets for 6 months.  You will then follow-up with either myself or Riley Lam, our nurse practitioner, for ongoing pain prescriptions.  Continue physical therapy and work on core strengthening to avoid use of back brace.  Feel free to make an appointment sooner if you need anything specific addressed.  Left shoulder pain If you wish to get an injection into your left shoulder, you can call the clinic and schedule with one of our providers.  I would recommend a subacromial bursa injection to start.  I will message your PT about incorporating L shoulder exercises into your therapies  Other orders -     HYDROcodone-Acetaminophen; Take 0.5-1 tablets by mouth daily as needed for severe pain.  Dispense: 30 tablet; Refill: 0

## 2022-05-11 NOTE — Patient Instructions (Addendum)
I will renew your prescriptions for Norco 5 mg half a tab to 1 tablet daily number 30 tablets for 6 months.  You will then follow-up with either myself or Riley Lam, our nurse practitioner, for ongoing pain prescriptions.  Continue physical therapy and work on core strengthening to avoid use of back brace.  Feel free to make an appointment sooner if you need anything specific addressed.  If you wish to get an injection into your left shoulder, you can call the clinic and schedule with one of our providers.  I would recommend a subacromial bursa injection to start.  I will message your PT about incorporating L shoulder exercises into your therapies

## 2022-05-12 ENCOUNTER — Ambulatory Visit (INDEPENDENT_AMBULATORY_CARE_PROVIDER_SITE_OTHER): Payer: 59 | Admitting: Family Medicine

## 2022-05-12 ENCOUNTER — Encounter: Payer: Self-pay | Admitting: Family Medicine

## 2022-05-12 VITALS — BP 100/65 | HR 61 | Temp 98.7°F | Ht 64.0 in | Wt 136.0 lb

## 2022-05-12 DIAGNOSIS — L989 Disorder of the skin and subcutaneous tissue, unspecified: Secondary | ICD-10-CM | POA: Diagnosis not present

## 2022-05-12 DIAGNOSIS — F5101 Primary insomnia: Secondary | ICD-10-CM | POA: Diagnosis not present

## 2022-05-12 DIAGNOSIS — F419 Anxiety disorder, unspecified: Secondary | ICD-10-CM | POA: Diagnosis not present

## 2022-05-12 DIAGNOSIS — F339 Major depressive disorder, recurrent, unspecified: Secondary | ICD-10-CM

## 2022-05-12 DIAGNOSIS — M5010 Cervical disc disorder with radiculopathy, unspecified cervical region: Secondary | ICD-10-CM | POA: Diagnosis not present

## 2022-05-12 DIAGNOSIS — R1319 Other dysphagia: Secondary | ICD-10-CM | POA: Diagnosis not present

## 2022-05-12 DIAGNOSIS — I10 Essential (primary) hypertension: Secondary | ICD-10-CM

## 2022-05-12 MED ORDER — TRAZODONE HCL 100 MG PO TABS
100.0000 mg | ORAL_TABLET | Freq: Every evening | ORAL | 0 refills | Status: DC | PRN
Start: 2022-05-12 — End: 2022-06-24

## 2022-05-12 MED ORDER — HYDROCHLOROTHIAZIDE 25 MG PO TABS
25.0000 mg | ORAL_TABLET | Freq: Every day | ORAL | 0 refills | Status: DC
Start: 2022-05-12 — End: 2022-07-11

## 2022-05-12 NOTE — Progress Notes (Signed)
New Patient Office Visit  Subjective   Patient ID: [name], female    DOB: 26-Jul-1960  Age: 62 y.o. MRN: [id number]  CC:  Chief Complaint  Patient presents with   Establish Care    HPI [name] presents to follow up on chronic conditions   1. Primary hypertension Monitors her BP at home. Ranges 100/60s at home. Has family history of CVA in father, mother, and sister. She has reduced her hours at work to 3 days a week, which has helped her stress. Reports that her diastolic has been as high as 125. She notices the increase with stress and anxiety and states that this happens rarely now.   2. Esophageal dysphagia Has not followed up in 3 years. States that she completed cologuard and is not really interested in completing EGD. She is not having any trouble swallowing currently. Attributes previous issues to radiation from her cancer treatment and states that issues resolved with her dilation procedure  3. Cervical disc disorder with radiculopathy of cervical region Follows with physical therapy and pain management. Has history of chronic back pain and lung cancer which caused pain in her left arm. Reports that PT works well.   4. Anxiety 5. Depression, recurrent 6. Insomnia  Is taking xanax to sleep, take one half of 0.5mg  (0.25mg  total) to sleep. Does not take it for anxiety. States that her anxiety is controlled. Feels that her anxiety and depression are well controlled. She started taking xanax to sleep ~15 years ago when her husband passed away.   7. Skin  Previously a patient of Dr. Orvan Falconer dermatology, but he is no longer accepting new patients. She had a skin lesion on her chin that she was referred to dermatology for. However, it has gone away now. She would like a new referral because she did not like the dermatology office she spoke to on the phone.   Outpatient Encounter Medications as of 05/12/2022  Medication Sig   ALPRAZolam (XANAX) 0.5 MG tablet TAKE  ONE TABLET TWICE DAILY AS NEEDED FOR ANXIETY   escitalopram (LEXAPRO) 20 MG tablet Take 1 tablet (20 mg total) by mouth daily.   hydrochlorothiazide (HYDRODIURIL) 25 MG tablet TAKE ONE TABLET ONCE DAILY   HYDROcodone-acetaminophen (NORCO) 5-325 MG tablet Take 0.5-1 tablets by mouth daily as needed for severe pain.   losartan (COZAAR) 100 MG tablet Take 1 tablet (100 mg total) by mouth daily.   No facility-administered encounter medications on file as of 05/12/2022.    Past Medical History:  Diagnosis Date   Anxiety    Arthritis    Constipation 02/17/2015   Depression, recurrent 07/24/2020   Emphysema lung    Fibromyalgia    GERD (gastroesophageal reflux disease)    Hypertension    Lung cancer 2012   pancoast tumor   Menopausal vaginal dryness 02/17/2015    Past Surgical History:  Procedure Laterality Date   CHOLECYSTECTOMY N/A 04/01/2016   Procedure: LAPAROSCOPIC CHOLECYSTECTOMY;  Surgeon: Franky Macho, MD;  Location: [facility];  Service: General;  Laterality: N/A;   COLONOSCOPY  11/2010   normal colonoscopy from anal verge to ileocecal valve; no polyps, masses, AVMs, diverticula, or evidence of inflammation   ESOPHAGOGASTRODUODENOSCOPY N/A 10/16/2013   WUJ:[id number] ring s/p dilation/2 cm HH. multiple 3 mm round and shallow erosions. 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 Hiatal hernia   LAPAROSCOPIC APPENDECTOMY N/A 06/21/2013   Procedure: APPENDECTOMY LAPAROSCOPIC;  Surgeon: Dalia Heading, MD;  Location: [facility];  Service: General;  Laterality: N/A;   [name] DILATION N/A 10/16/2013   Procedure: Alvy Beal;  Surgeon: Corbin Ade, MD;  Location: [facility];  Service: Endoscopy;  Laterality: N/ATod [name] PLACEMENT  [id number]   Rt   IJ  Port-A-Cath  dr.burney   STERNAL WIRES REMOVAL  01/23/2012   Procedure: STERNAL WIRES REMOVAL;  Surgeon: Delight Ovens, MD;  Location: [facility];   Service: Thoracic;  Laterality: N/A;   THORACOTOMY  [id number]   transsmanubrial anterolaterl thoracotomy with chest wall resection includinf the first rib and left  upper lobectomy for Pancoast turmor   TUBAL LIGATION      Family History  Problem Relation Age of Onset   Cancer Mother        lung   Hypertension Father    Stroke Father    Hypertension Sister    Heart disease Sister    Cancer Brother 44       pancreatic   Colon cancer Neg Hx    Breast cancer Neg Hx     Social History   Socioeconomic History   Marital status: Married    Spouse name: Not on file   Number of children: Not on file   Years of education: Not on file   Highest education level: 12th grade  Occupational History   Not on file  Tobacco Use   Smoking status: Former    Packs/day: 1.50    Years: 35.00    Additional pack years: 0.00    Total pack years: 52.50    Types: Cigarettes    Quit date: 02/04/2010    Years since quitting: 12.2   Smokeless tobacco: Never   Tobacco comments:    Quit x 8 years  Vaping Use   Vaping Use: Never used  Substance and Sexual Activity   Alcohol use: Yes    Alcohol/week: 0.0 standard drinks of alcohol    Comment: occasionally    Drug use: No   Sexual activity: Yes    Birth control/protection: Post-menopausal  Other Topics Concern   Not on file  Social History Narrative   Not on file   Social Determinants of Health   Financial Resource Strain: Low Risk  (05/08/2022)   Overall Financial Resource Strain (CARDIA)    Difficulty of Paying Living Expenses: Not hard at all  Food Insecurity: No Food Insecurity (05/08/2022)   Hunger Vital Sign    Worried About Running Out of Food in the Last Year: Never true    Ran Out of Food in the Last Year: Never true  Transportation Needs: No Transportation Needs (05/08/2022)   PRAPARE - Administrator, Civil Service (Medical): No    Lack of Transportation (Non-Medical): No  Physical Activity: Unknown (05/08/2022)    Exercise Vital Sign    Days of Exercise per Week: Patient declined    Minutes of Exercise per Session: Not on file  Stress: No Stress Concern Present (05/08/2022)   [employer] of Occupational Health - Occupational Stress Questionnaire    Feeling of Stress : Not at all  Social Connections: Socially Integrated (05/08/2022)   Social Connection and Isolation Panel [NHANES]    Frequency of Communication with Friends and Family: More than three times a week    Frequency of Social Gatherings with Friends and Family: More than three times a week    Attends Religious Services: More than 4 times per year  Active Member of Clubs or Organizations: Yes    Attends Banker Meetings: More than 4 times per year    Marital Status: Married  Catering manager Violence: Not on file    ROS As per HPI   Objective   BP 100/65   Pulse 61   Temp 98.7 F (37.1 C)   Ht  (1.626 m)   Wt 136 lb (61.7 kg)   SpO2 96%   BMI 23.34 kg/m   Physical Exam Constitutional:      General: She is not in acute distress.    Appearance: Normal appearance. She is not ill-appearing, toxic-appearing or diaphoretic.  Cardiovascular:     Rate and Rhythm: Normal rate.     Pulses: Normal pulses.     Heart sounds: Normal heart sounds. No murmur heard.    No gallop.  Pulmonary:     Effort: Pulmonary effort is normal. No respiratory distress.     Breath sounds: Normal breath sounds. No stridor. No wheezing, rhonchi or rales.  Skin:    General: Skin is warm.     Capillary Refill: Capillary refill takes less than 2 seconds.  Neurological:     General: No focal deficit present.     Mental Status: She is alert and oriented to person, place, and time. Mental status is at baseline.     Motor: No weakness.  Psychiatric:        Attention and Perception: Attention and perception normal.        Mood and Affect: Mood and affect normal.        Speech: Speech normal.        Behavior: Behavior normal.         Thought Content: Thought content normal.        Cognition and Memory: Cognition and memory normal.        Judgment: Judgment normal.       05/12/2022    4:01 PM 05/11/2022   10:36 AM 03/23/2022    9:32 AM  Depression screen PHQ 2/9  Decreased Interest 0 0 0  Down, Depressed, Hopeless 0 0 0  PHQ - 2 Score 0 0 0  Altered sleeping 0    Tired, decreased energy 0    Change in appetite 0    Feeling bad or failure about yourself  0    Trouble concentrating 0    Moving slowly or fidgety/restless 0    Suicidal thoughts 0    PHQ-9 Score 0    Difficult doing work/chores Not difficult at all        05/12/2022    4:02 PM 01/26/2022   10:34 AM 09/28/2021    9:23 AM 06/02/2021   12:13 PM  GAD 7 : Generalized Anxiety Score  Nervous, Anxious, on Edge 0 0 1 0  Control/stop worrying 0 0 0 0  Worry too much - different things 0 0 0 0  Trouble relaxing 1 0 1 1  Restless 1 0 1 1  Easily annoyed or irritable 1 0 1 1  Afraid - awful might happen 0 0 0 0  Total GAD 7 Score 3 0 4 3  Anxiety Difficulty Not difficult at all Not difficult at all Not difficult at all Somewhat difficult     Assessment & Plan:  1. Primary hypertension Well controlled. Continue current regimen. Refill as below.  - hydrochlorothiazide (HYDRODIURIL) 25 MG tablet; Take 1 tablet (25 mg total) by mouth daily.  Dispense: 30 tablet; Refill:  0  2. Esophageal dysphagia Declines referral at this time. Patient to notify provider if she has issues and would like to be re-referred to GI. Previously seen by Dr. Jena Gauss.   3. Cervical disc disorder with radiculopathy of cervical region Well controlled. Continue current regimen.   4. Anxiety 5. Depression, recurrent Well controlled. Continue current regimen.   6. Primary insomnia Discussed with patient the risk of dependence and risk of benzodiazepines and opioids. Instructed patient to start with one half of a tablet. Then to increase as needed. Discussed with patient and provided  written information on serotonin syndrome and signs of toxicity. Consulted Dettinger, J., MD on decision. Will monitor and have patient follow up in one month to determine results.  - traZODone (DESYREL) 100 MG tablet; Take 1-1.5 tablets (100-150 mg total) by mouth at bedtime as needed for sleep.  Dispense: 45 tablet; Refill: 0  7. Skin abnormalities Referral placed as below for patient to follow with different dermatologist for skin exam.  - Ambulatory referral to Dermatology  Follow up in 1 month for CPE and follow up on insomnia   The above assessment and management plan was discussed with the patient. The patient verbalized understanding of and has agreed to the management plan using shared-decision making. Patient is aware to call the clinic if they develop any new symptoms or if symptoms fail to improve or worsen. Patient is aware when to return to the clinic for a follow-up visit. Patient educated on when it is appropriate to go to the emergency department.   Neale Burly, DNP-FNP [facility] [address] [place], [place] [address] [phone]

## 2022-05-12 NOTE — Patient Instructions (Signed)
Monitor for signs and symptoms of serotonin syndrome/serotonin toxicity (eg, hyperreflexia, clonus, hyperthermia, diaphoresis, tremor, autonomic instability, mental status changes

## 2022-05-15 DIAGNOSIS — M25512 Pain in left shoulder: Secondary | ICD-10-CM | POA: Insufficient documentation

## 2022-05-15 MED ORDER — HYDROCODONE-ACETAMINOPHEN 5-325 MG PO TABS: 0.5000 | ORAL_TABLET | Freq: Two times a day (BID) | ORAL | 0 refills | Status: AC | PRN

## 2022-05-16 NOTE — Addendum Note (Signed)
Addended by: Elijah Birk on: 05/16/2022 01:22 PM   Modules accepted: Orders

## 2022-05-17 ENCOUNTER — Ambulatory Visit: Payer: 59 | Admitting: *Deleted

## 2022-05-17 DIAGNOSIS — M5459 Other low back pain: Secondary | ICD-10-CM

## 2022-05-17 DIAGNOSIS — M62830 Muscle spasm of back: Secondary | ICD-10-CM

## 2022-05-17 DIAGNOSIS — M5440 Lumbago with sciatica, unspecified side: Secondary | ICD-10-CM | POA: Diagnosis not present

## 2022-05-17 DIAGNOSIS — R293 Abnormal posture: Secondary | ICD-10-CM | POA: Diagnosis not present

## 2022-05-17 DIAGNOSIS — G8929 Other chronic pain: Secondary | ICD-10-CM | POA: Diagnosis not present

## 2022-05-17 NOTE — Therapy (Signed)
OUTPATIENT PHYSICAL THERAPY THORACOLUMBAR TREATMENT   Patient Name: [name] MRN: [id number] DOB:Oct 01, 1960, 62 y.o., female Today's Date: 05/17/2022  END OF SESSION:  PT End of Session - 05/17/22 0947     Visit Number 3    Number of Visits 12    Date for PT Re-Evaluation 08/01/22    Authorization Type FOTO AT LEAST EVERY 5TH VISIT.  PROGRESS NOTE AT 10TH VISIT.  KX MODIFIER AFTER 15 VISITS.    PT Start Time 0945    PT Stop Time 1033    PT Time Calculation (min) 48 min             Past Medical History:  Diagnosis Date   Anxiety    Arthritis    Constipation 02/17/2015   Depression, recurrent 07/24/2020   Emphysema lung    Fibromyalgia    GERD (gastroesophageal reflux disease)    Hypertension    Lung cancer 2012   pancoast tumor   Menopausal vaginal dryness 02/17/2015   Past Surgical History:  Procedure Laterality Date   CHOLECYSTECTOMY N/A 04/01/2016   Procedure: LAPAROSCOPIC CHOLECYSTECTOMY;  Surgeon: Franky Macho, MD;  Location: [facility];  Service: General;  Laterality: N/A;   COLONOSCOPY  11/2010   normal colonoscopy from anal verge to ileocecal valve; no polyps, masses, AVMs, diverticula, or evidence of inflammation   ESOPHAGOGASTRODUODENOSCOPY N/A 10/16/2013   ION:[id number] ring s/p dilation/2 cm HH. multiple 3 mm round and shallow erosions. 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 Hiatal hernia   LAPAROSCOPIC APPENDECTOMY N/A 06/21/2013   Procedure: APPENDECTOMY LAPAROSCOPIC;  Surgeon: Dalia Heading, MD;  Location: [facility];  Service: General;  Laterality: N/A;   MALONEY DILATION N/A 10/16/2013   Procedure: Alvy Beal;  Surgeon: Corbin Ade, MD;  Location[facility];  Service: Endoscopy;  Laterality: N/ATod [place] PLACEMENT  [id number]   Rt   IJ  Port-A-Cath  dr.burney   STERNAL WIRES REMOVAL  01/23/2012   Procedure: STERNAL WIRES REMOVAL;  Surgeon: Delight Ovens, MD;  Location: [facility] OR;  Service: Thoracic;  Laterality: N/A;   THORACOTOMY  [id number]   transsmanubrial anterolaterl thoracotomy with chest wall resection includinf the first rib and left  upper lobectomy for Pancoast turmor   TUBAL LIGATION     Patient Active Problem List   Diagnosis Date Noted   Left shoulder pain 05/15/2022   Chronic pain syndrome 02/16/2022   Chronic low back pain with sciatica 02/16/2022   Annual physical exam 04/26/2021   Insomnia 01/11/2021   Depression, recurrent 07/24/2020   Controlled substance agreement signed 05/25/2020   Simple chronic bronchitis 05/10/2019   Elevated LFTs 03/20/2019   Neuropathy 09/27/2016   DDD (degenerative disc disease), cervical 12/08/2015   History of lung cancer 12/08/2015   Cervical disc disorder with radiculopathy of cervical region 09/23/2015   Gastroesophageal reflux disease without esophagitis 03/19/2015   Menopausal vaginal dryness 02/17/2015   Constipation 02/17/2015   Esophageal dysphagia 09/25/2013   Hiccups 04/19/2013   Unspecified constipation 04/18/2013   Anxiety    Primary hypertension     REFERRING PROVIDER: Elijah Birk  REFERRING DIAG: Chronic low back pain with Sciatica.  Rationale for Evaluation and Treatment: Rehabilitation  THERAPY DIAG:  Other low back pain  Muscle spasm of back  Abnormal posture  ONSET DATE: Many years.  SUBJECTIVE:  SUBJECTIVE STATEMENT: Pain 7/10 today. Worked in the flower bed     PERTINENT HISTORY:  Fibromyalgia, HTN.    PAIN:  Are you having pain? Yes: NPRS scale: 4/10 Pain location: Left low back. Pain description: Ache, shoot. Aggravating factors: See above. Relieving factors: See above.  PRECAUTIONS: None  WEIGHT BEARING RESTRICTIONS: No  FALLS:  Has patient  fallen in last 6 months? No  LIVING ENVIRONMENT: Lives with: lives with their spouse Lives in: House/apartment Stairs: Yes. Has following equipment at home: None  OCCUPATION: Works a a Psychologist, educational.  PLOF: Independent  PATIENT GOALS: Better quality of life and more energy.  OBJECTIVE:    PATIENT SURVEYS:  FOTO 53.   POSTURE: posterior pelvic tilt, decreased lumbar lordosis.  PALPATION: Tender to palpation and taut over left lumbar musculature.  LUMBAR ROM:   Full active lumbar flexion and extension to 15 degrees with pain increase.  LOWER EXTREMITY ROM:     WNL.  LOWER EXTREMITY MMT:    Normal LE strength.  LUMBAR SPECIAL TESTS:  Equal leg lengths.  (-) SLR testing.  GAIT: WNL.  TODAY'S TREATMENT:                                                                                                                              DATE:                                                               05-17-22  Discussed / reviewed pain triggers and ADL's and movement patterns to decrease pain with ADL's and activities. AB bracing x10 hold 5 secs performed. Dying bug x 6 hold 5 secs, Bridge x 10 hold 3-5 secs. Sidelying hip abd x 6 hold 10 secs , Cat /camel  x 10   for mobilty      Handout given for HEP. HMP and IFC at 80-150 Hz on 40% scan x 15 minutes to patient's left lumbar region.      ASSESSMENT:  CLINICAL IMPRESSION:  Today's Rx focused on AB bracing and review of movement patterns as well as modifications to assist Pt with ADL's and ways for Pt to avoid daily pain triggers and decrease LB sensitivity.Pt reports that she would be able to perform HEP in the floor at home. She was able to perform core strengthening exs today with mainly fatigue and mm soreness, but not increased back pain. Add standing bird dog  IFC and HMP end of session   OBJECTIVE IMPAIRMENTS: decreased activity tolerance, decreased ROM, increased muscle spasms, postural dysfunction, and pain.    ACTIVITY LIMITATIONS: carrying, lifting, bending, sitting, and standing  PARTICIPATION LIMITATIONS: meal prep, cleaning, laundry, and yard work  PERSONAL FACTORS: Time since onset of injury/illness/exacerbation and 1  comorbidity: Fibromyalgia  are also affecting patient's functional outcome.   REHAB POTENTIAL: Good  CLINICAL DECISION MAKING: Stable/uncomplicated  EVALUATION COMPLEXITY: Low   GOALS:  SHORT TERM GOALS: Target date: 05/17/22  Ind with an initial HEP. Goal status: INITIAL   LONG TERM GOALS: Target date: 08/01/22.  Ind with an advanced HEP.  Goal status: INITIAL  2.  Perform ADL's with pain not > 4/10.  Goal status: INITIAL  3.  Eliminate left LE pain.  Goal status: INITIAL  PLAN:  PT FREQUENCY: 2x/week  PT DURATION: 6 weeks  PLANNED INTERVENTIONS: Therapeutic exercises, Therapeutic activity, Patient/Family education, Self Care, Dry Needling, Electrical stimulation, Cryotherapy, Moist heat, Ultrasound, and Manual therapy.  PLAN FOR NEXT SESSION: Modalities as needed.  STW/M to left lumbar musculature.  Core exercise progression.  Body mechanics training and spinal protection techniques.   [name],[name], PTA 05/17/2022, 10:37 AM

## 2022-05-19 ENCOUNTER — Ambulatory Visit: Payer: 59 | Admitting: *Deleted

## 2022-05-19 DIAGNOSIS — M5440 Lumbago with sciatica, unspecified side: Secondary | ICD-10-CM | POA: Diagnosis not present

## 2022-05-19 DIAGNOSIS — M5459 Other low back pain: Secondary | ICD-10-CM

## 2022-05-19 DIAGNOSIS — R293 Abnormal posture: Secondary | ICD-10-CM | POA: Diagnosis not present

## 2022-05-19 DIAGNOSIS — M62830 Muscle spasm of back: Secondary | ICD-10-CM | POA: Diagnosis not present

## 2022-05-19 DIAGNOSIS — G8929 Other chronic pain: Secondary | ICD-10-CM | POA: Diagnosis not present

## 2022-05-19 NOTE — Therapy (Signed)
OUTPATIENT PHYSICAL THERAPY THORACOLUMBAR TREATMENT   Patient Name: [name] MRN: [id number] DOB:February 03, 1960, 62 y.o., female Today's Date: 05/19/2022  END OF SESSION:  PT End of Session - 05/19/22 1259     Visit Number 4    Number of Visits 12    Date for PT Re-Evaluation 08/01/22    Authorization Type FOTO AT LEAST EVERY 5TH VISIT.  PROGRESS NOTE AT 10TH VISIT.  KX MODIFIER AFTER 15 VISITS.    PT Start Time 1300    PT Stop Time 1350    PT Time Calculation (min) 50 min             Past Medical History:  Diagnosis Date   Anxiety    Arthritis    Constipation 02/17/2015   Depression, recurrent 07/24/2020   Emphysema lung    Fibromyalgia    GERD (gastroesophageal reflux disease)    Hypertension    Lung cancer 2012   pancoast tumor   Menopausal vaginal dryness 02/17/2015   Past Surgical History:  Procedure Laterality Date   CHOLECYSTECTOMY N/A 04/01/2016   Procedure: LAPAROSCOPIC CHOLECYSTECTOMY;  Surgeon: Franky Macho, MD;  Location: [facility];  Service: General;  Laterality: N/A;   COLONOSCOPY  11/2010   normal colonoscopy from anal verge to ileocecal valve; no polyps, masses, AVMs, diverticula, or evidence of inflammation   ESOPHAGOGASTRODUODENOSCOPY N/A 10/16/2013   NWG:[id number] ring s/p dilation/2 cm HH. multiple 3 mm round and shallow erosions. 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 Hiatal hernia   LAPAROSCOPIC APPENDECTOMY N/A 06/21/2013   Procedure: APPENDECTOMY LAPAROSCOPIC;  Surgeon: Dalia Heading, MD;  Location: [facility];  Service: General;  Laterality: N/A;   MALONEY DILATION N/A 10/16/2013   Procedure: Alvy Beal;  Surgeon: Corbin Ade, MD;  Location: [facility];  Service: Endoscopy;  Laterality: N/ATod [place] PLACEMENT  [id number]   Rt   IJ  Port-A-Cath  dr.burney   STERNAL WIRES REMOVAL  01/23/2012   Procedure: STERNAL WIRES REMOVAL;  Surgeon: Delight Ovens, MD;  Location: [facility] OR;  Service: Thoracic;  Laterality: N/A;   THORACOTOMY  [id number]   transsmanubrial anterolaterl thoracotomy with chest wall resection includinf the first rib and left  upper lobectomy for Pancoast turmor   TUBAL LIGATION     Patient Active Problem List   Diagnosis Date Noted   Left shoulder pain 05/15/2022   Chronic pain syndrome 02/16/2022   Chronic low back pain with sciatica 02/16/2022   Annual physical exam 04/26/2021   Insomnia 01/11/2021   Depression, recurrent 07/24/2020   Controlled substance agreement signed 05/25/2020   Simple chronic bronchitis 05/10/2019   Elevated LFTs 03/20/2019   Neuropathy 09/27/2016   DDD (degenerative disc disease), cervical 12/08/2015   History of lung cancer 12/08/2015   Cervical disc disorder with radiculopathy of cervical region 09/23/2015   Gastroesophageal reflux disease without esophagitis 03/19/2015   Menopausal vaginal dryness 02/17/2015   Constipation 02/17/2015   Esophageal dysphagia 09/25/2013   Hiccups 04/19/2013   Unspecified constipation 04/18/2013   Anxiety    Primary hypertension     REFERRING PROVIDER: Elijah Birk  REFERRING DIAG: Chronic low back pain with Sciatica.  Rationale for Evaluation and Treatment: Rehabilitation  THERAPY DIAG:  Other low back pain  Muscle spasm of back  Abnormal posture  ONSET DATE: Many years.  SUBJECTIVE:  SUBJECTIVE STATEMENT: Pain 9/10 today. Lots of Laundry yesterday     PERTINENT HISTORY:  Fibromyalgia, HTN.    PAIN:  Are you having pain? Yes: NPRS scale: 8/10 Pain location: Left low back. Pain description: Ache, shoot. Aggravating factors: See above. Relieving factors: See above.  PRECAUTIONS: None  WEIGHT BEARING RESTRICTIONS: No  FALLS:  Has patient  fallen in last 6 months? No  LIVING ENVIRONMENT: Lives with: lives with their spouse Lives in: House/apartment Stairs: Yes. Has following equipment at home: None  OCCUPATION: Works a a Psychologist, educational.  PLOF: Independent  PATIENT GOALS: Better quality of life and more energy.  OBJECTIVE:    PATIENT SURVEYS:  FOTO 53.   POSTURE: posterior pelvic tilt, decreased lumbar lordosis.  PALPATION: Tender to palpation and taut over left lumbar musculature.  LUMBAR ROM:   Full active lumbar flexion and extension to 15 degrees with pain increase.  LOWER EXTREMITY ROM:     WNL.  LOWER EXTREMITY MMT:    Normal LE strength.  LUMBAR SPECIAL TESTS:  Equal leg lengths.  (-) SLR testing.  GAIT: WNL.  TODAY'S TREATMENT:                                                                                                                              DATE:                                                               05-19-22  Discussed / reviewed pain triggers and ADL's and movement patterns to decrease pain with ADL's and activities. AB bracing x10 hold 5 secs performed. Dying bug x 6 hold 5 secs, Bridge x 10 hold 3-5 secs. Sidelying hip abd x 6 hold 10 secs , Cat /camel  x 10   for mobility, standing at counter bird-dog x 6 hold 5 secs  HMP and IFC at 80-150 Hz on 40% scan x 15 minutes to patient's left lumbar region.      ASSESSMENT:  CLINICAL IMPRESSION:  Today's Rx focused on AB bracing and core strengthening .Pt reports that she over did it cleaning  at home yesterday. She was able to perform core strengthening exs today with mainly fatigue and mm soreness, but not increased back pain. She did well with standing bird dog.Handout given  IFC and HMP end of session   OBJECTIVE IMPAIRMENTS: decreased activity tolerance, decreased ROM, increased muscle spasms, postural dysfunction, and pain.   ACTIVITY LIMITATIONS: carrying, lifting, bending, sitting, and standing  PARTICIPATION  LIMITATIONS: meal prep, cleaning, laundry, and yard work  PERSONAL FACTORS: Time since onset of injury/illness/exacerbation and 1 comorbidity: Fibromyalgia  are also affecting patient's functional outcome.   REHAB POTENTIAL: Good  CLINICAL DECISION MAKING: Stable/uncomplicated  EVALUATION COMPLEXITY:  Low   GOALS:  SHORT TERM GOALS: Target date: 05/17/22  Ind with an initial HEP. Goal status: INITIAL   LONG TERM GOALS: Target date: 08/01/22.  Ind with an advanced HEP.  Goal status: INITIAL  2.  Perform ADL's with pain not > 4/10.  Goal status: INITIAL  3.  Eliminate left LE pain.  Goal status: INITIAL  PLAN:  PT FREQUENCY: 2x/week  PT DURATION: 6 weeks  PLANNED INTERVENTIONS: Therapeutic exercises, Therapeutic activity, Patient/Family education, Self Care, Dry Needling, Electrical stimulation, Cryotherapy, Moist heat, Ultrasound, and Manual therapy.  PLAN FOR NEXT SESSION: Modalities as needed.  STW/M to left lumbar musculature.  Core exercise progression.  Body mechanics training and spinal protection techniques.   [place] Natarajan,CHRIS, PTA 05/19/2022, 2:36 PM

## 2022-05-23 ENCOUNTER — Ambulatory Visit: Payer: 59 | Admitting: Physical Therapy

## 2022-05-23 ENCOUNTER — Encounter: Payer: Self-pay | Admitting: Physical Therapy

## 2022-05-23 DIAGNOSIS — R293 Abnormal posture: Secondary | ICD-10-CM

## 2022-05-23 DIAGNOSIS — M5459 Other low back pain: Secondary | ICD-10-CM

## 2022-05-23 DIAGNOSIS — M62830 Muscle spasm of back: Secondary | ICD-10-CM

## 2022-05-23 DIAGNOSIS — M5440 Lumbago with sciatica, unspecified side: Secondary | ICD-10-CM | POA: Diagnosis not present

## 2022-05-23 DIAGNOSIS — G8929 Other chronic pain: Secondary | ICD-10-CM | POA: Diagnosis not present

## 2022-05-23 NOTE — Therapy (Signed)
OUTPATIENT PHYSICAL THERAPY THORACOLUMBAR TREATMENT   Patient Name: [name] MRN: [id number] DOB:March 11, 1960, 62 y.o., female Today's Date: 05/23/2022  END OF SESSION:  PT End of Session - 05/23/22 1300     Visit Number 5    Number of Visits 12    Date for PT Re-Evaluation 08/01/22    Authorization Type FOTO AT LEAST EVERY 5TH VISIT.  PROGRESS NOTE AT 10TH VISIT.  KX MODIFIER AFTER 15 VISITS.    PT Start Time 1300    PT Stop Time 1324    PT Time Calculation (min) 24 min    Activity Tolerance Patient tolerated treatment well    Behavior During Therapy [facility] for tasks assessed/performed            Past Medical History:  Diagnosis Date   Anxiety    Arthritis    Constipation 02/17/2015   Depression, recurrent (HCC) 07/24/2020   Emphysema lung (HCC)    Fibromyalgia    GERD (gastroesophageal reflux disease)    Hypertension    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   Menopausal vaginal dryness 02/17/2015   Past Surgical History:  Procedure Laterality Date   CHOLECYSTECTOMY N/A 04/01/2016   Procedure: LAPAROSCOPIC CHOLECYSTECTOMY;  Surgeon: Franky Macho, MD;  Location: [facility];  Service: General;  Laterality: N/A;   COLONOSCOPY  11/2010   normal colonoscopy from anal verge to ileocecal valve; no polyps, masses, AVMs, diverticula, or evidence of inflammation   ESOPHAGOGASTRODUODENOSCOPY N/A 10/16/2013   WUJ:WJXBJYNW ring s/p dilation/2 cm HH. multiple 3 mm round and shallow erosions. 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 Hiatal hernia   LAPAROSCOPIC APPENDECTOMY N/A 06/21/2013   Procedure: APPENDECTOMY LAPAROSCOPIC;  Surgeon: Dalia Heading, MD;  Location: [facility];  Service: General;  Laterality: N/A;   MALONEY DILATION N/A 10/16/2013   Procedure: Alvy Beal;  Surgeon: Corbin Ade, MD;  Location[facility];  Service: Endoscopy;  Laterality: N/ATod [place] PLACEMENT   [id number]   Rt   IJ  Port-A-Cath  dr.burney   STERNAL WIRES REMOVAL  01/23/2012   Procedure: STERNAL WIRES REMOVAL;  Surgeon: Delight Ovens, MD;  Location: [facility] OR;  Service: Thoracic;  Laterality: N/A;   THORACOTOMY  [id number]   transsmanubrial anterolaterl thoracotomy with chest wall resection includinf the first rib and left  upper lobectomy for Pancoast turmor   TUBAL LIGATION     Patient Active Problem List   Diagnosis Date Noted   Left shoulder pain 05/15/2022   Chronic pain syndrome 02/16/2022   Chronic low back pain with sciatica 02/16/2022   Annual physical exam 04/26/2021   Insomnia 01/11/2021   Depression, recurrent (HCC) 07/24/2020   Controlled substance agreement signed 05/25/2020   Simple chronic bronchitis (HCC) 05/10/2019   Elevated LFTs 03/20/2019   Neuropathy 09/27/2016   DDD (degenerative disc disease), cervical 12/08/2015   History of lung cancer 12/08/2015   Cervical disc disorder with radiculopathy of cervical region 09/23/2015   Gastroesophageal reflux disease without esophagitis 03/19/2015   Menopausal vaginal dryness 02/17/2015   Constipation 02/17/2015   Esophageal dysphagia 09/25/2013   Hiccups 04/19/2013   Unspecified constipation 04/18/2013   Anxiety    Primary hypertension    REFERRING PROVIDER: Elijah Birk  REFERRING DIAG: Chronic low back pain with Sciatica.  Rationale for Evaluation and Treatment: Rehabilitation  THERAPY DIAG:  Other low back pain  Muscle spasm of back  Abnormal posture  ONSET  DATE: Many years.  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 More shoulder related pain after gardening this weekend. Took pain med prior to PT today.  PERTINENT HISTORY:  Fibromyalgia, HTN.     PAIN:  Are you having pain? Yes: NPRS scale: 4/10 Pain location: Left low back. Pain description: Ache, shoot. Aggravating factors: See above. Relieving factors: See above.  PRECAUTIONS: None  PATIENT GOALS: Better quality of life and more energy.  OBJECTIVE:   PATIENT SURVEYS:  FOTO 53.  POSTURE: posterior pelvic tilt, decreased lumbar lordosis.  PALPATION: Tender to palpation and taut over left lumbar musculature.  LUMBAR ROM:  Full active lumbar flexion and extension to 15 degrees with pain increase.  LOWER EXTREMITY ROM:   WNL.  LOWER EXTREMITY MMT:   Normal LE strength.  LUMBAR SPECIAL TESTS:  Equal leg lengths.  (-) SLR testing.  GAIT: WNL.  TODAY'S TREATMENT:                                                                                                                              DATE: 05/23/22 EXERCISE LOG  Exercise Repetitions and Resistance Comments  PPT X10 reps 5 sec holds   Bridge X15 reps 5 sec holds   Marching X10 reps 3 sec holds   Dying bug X5 reps 10 sec holds   SL hip abduction X10 reps 5 sec holds each   Mod bird dog  X10 reps 10 sec holds each    Blank cell = exercise not performed today   ASSESSMENT:  CLINICAL IMPRESSION: Patient presented in clinic with reports of more shoulder than LBP upon arrival. Patient did report LBP while gardening but stated that she pushed through it and could tolerate it better where she was outside with an activity that she enjoys. Patient requested a shorter  treatment as they are very busy at work today. Patient feels soreness with LE, glutes and core but reports that she can tell some improvement. Patient able to tolerate all therex well with no complaints of increased pain. Demonstration of SL hip clam provided for patient to complete as part of HEP routine.  OBJECTIVE IMPAIRMENTS: decreased activity tolerance, decreased ROM, increased muscle spasms, postural dysfunction, and pain.   ACTIVITY LIMITATIONS:  carrying, lifting, bending, sitting, and standing  PARTICIPATION LIMITATIONS: meal prep, cleaning, laundry, and yard work  PERSONAL FACTORS: Time since onset of injury/illness/exacerbation and 1 comorbidity: Fibromyalgia  are also affecting patient's functional outcome.   REHAB POTENTIAL: Good  CLINICAL DECISION MAKING: Stable/uncomplicated  EVALUATION COMPLEXITY: Low  GOALS:  SHORT TERM GOALS: Target date: 05/17/22  Ind with an initial HEP. Goal status: MET  LONG TERM GOALS: Target date: 08/01/22.  Ind with an advanced HEP.  Goal status: On-going  2.  Perform ADL's with pain not > 4/10.  Goal status: On-going  3.  Eliminate left LE pain.  Goal status: On-going  PLAN:  PT FREQUENCY: 2x/week  PT DURATION: 6 weeks  PLANNED INTERVENTIONS: Therapeutic exercises, Therapeutic activity, Patient/Family education, Self Care, Dry Needling, Electrical stimulation, Cryotherapy, Moist heat, Ultrasound, and Manual therapy.  PLAN FOR NEXT SESSION: Modalities as needed.  STW/M to left lumbar musculature.  Core exercise progression.  Body mechanics training and spinal protection techniques.  Marvell Fuller, PTA 05/23/2022, 1:31 PM

## 2022-05-24 ENCOUNTER — Ambulatory Visit: Payer: 59 | Admitting: Physical Therapy

## 2022-05-26 ENCOUNTER — Ambulatory Visit: Payer: 59 | Attending: Physical Medicine and Rehabilitation | Admitting: Physical Therapy

## 2022-05-26 DIAGNOSIS — G8929 Other chronic pain: Secondary | ICD-10-CM | POA: Insufficient documentation

## 2022-05-26 DIAGNOSIS — M25512 Pain in left shoulder: Secondary | ICD-10-CM | POA: Insufficient documentation

## 2022-05-26 NOTE — Therapy (Signed)
OUTPATIENT PHYSICAL THERAPY THORACOLUMBAR TREATMENT   Patient Name: [name] MRN: [id number] DOB:12-22-1960, 62 y.o., female Today's Date: 05/26/2022  END OF SESSION:  PT End of Session - 05/26/22 1322     Visit Number 6    Number of Visits 12    Date for PT Re-Evaluation 08/01/22    Authorization Type FOTO AT LEAST EVERY 5TH VISIT.  PROGRESS NOTE AT 10TH VISIT.  KX MODIFIER AFTER 15 VISITS.    PT Start Time 0107    PT Stop Time 0146    PT Time Calculation (min) 39 min    Activity Tolerance Patient tolerated treatment well    Behavior During Therapy [facility] for tasks assessed/performed             Past Medical History:  Diagnosis Date   Anxiety    Arthritis    Constipation 02/17/2015   Depression, recurrent (HCC) 07/24/2020   Emphysema lung (HCC)    Fibromyalgia    GERD (gastroesophageal reflux disease)    Hypertension    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   Menopausal vaginal dryness 02/17/2015   Past Surgical History:  Procedure Laterality Date   CHOLECYSTECTOMY N/A 04/01/2016   Procedure: LAPAROSCOPIC CHOLECYSTECTOMY;  Surgeon: Franky Macho, MD;  Location: [facility];  Service: General;  Laterality: N/A;   COLONOSCOPY  11/2010   normal colonoscopy from anal verge to ileocecal valve; no polyps, masses, AVMs, diverticula, or evidence of inflammation   ESOPHAGOGASTRODUODENOSCOPY N/A 10/16/2013   WUJ:WJXBJYNW ring s/p dilation/2 cm HH. multiple 3 mm round and shallow erosions. 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 Hiatal hernia   LAPAROSCOPIC APPENDECTOMY N/A 06/21/2013   Procedure: APPENDECTOMY LAPAROSCOPIC;  Surgeon: Dalia Heading, MD;  Location: [facility];  Service: General;  Laterality: N/A;   MALONEY DILATION N/A 10/16/2013   Procedure: Alvy Beal;  Surgeon: Corbin Ade, MD;  [facility];  Service: Endoscopy;  Laterality: N/ATod [place] PLACEMENT   [id number]   Rt   IJ  Port-A-Cath  dr.burney   STERNAL WIRES REMOVAL  01/23/2012   Procedure: STERNAL WIRES REMOVAL;  Surgeon: Delight Ovens, MD;  Location: [facility] OR;  Service: Thoracic;  Laterality: N/A;   THORACOTOMY  [id number]   transsmanubrial anterolaterl thoracotomy with chest wall resection includinf the first rib and left  upper lobectomy for Pancoast turmor   TUBAL LIGATION     Patient Active Problem List   Diagnosis Date Noted   Left shoulder pain 05/15/2022   Chronic pain syndrome 02/16/2022   Chronic low back pain with sciatica 02/16/2022   Annual physical exam 04/26/2021   Insomnia 01/11/2021   Depression, recurrent (HCC) 07/24/2020   Controlled substance agreement signed 05/25/2020   Simple chronic bronchitis (HCC) 05/10/2019   Elevated LFTs 03/20/2019   Neuropathy 09/27/2016   DDD (degenerative disc disease), cervical 12/08/2015   History of lung cancer 12/08/2015   Cervical disc disorder with radiculopathy of cervical region 09/23/2015   Gastroesophageal reflux disease without esophagitis 03/19/2015   Menopausal vaginal dryness 02/17/2015   Constipation 02/17/2015   Esophageal dysphagia 09/25/2013   Hiccups 04/19/2013   Unspecified constipation 04/18/2013   Anxiety    Primary hypertension    REFERRING PROVIDER: Elijah Birk  REFERRING DIAG: Chronic low back pain with Sciatica.  Rationale for Evaluation and Treatment: Rehabilitation  THERAPY DIAG:  Chronic left shoulder pain  ONSET DATE: Many years.  SUBJECTIVE:  SUBJECTIVE STATEMENT:  Patient with new order for left shoulder pain. PERTINENT HISTORY:  Fibromyalgia, HTN.    PAIN:  Are you having pain? 6/10, left shoulder, ache.  PRECAUTIONS: None  PATIENT GOALS: Better quality of life and more energy.  OBJECTIVE:    PATIENT SURVEYS:  FOTO 53.  POSTURE: posterior pelvic tilt, decreased lumbar lordosis.  PALPATION: Tender to palpation and taut over left lumbar musculature.  LUMBAR ROM:  Full active lumbar flexion and extension to 15 degrees with pain increase.  LOWER EXTREMITY ROM:   WNL.  LOWER EXTREMITY MMT:   Normal LE strength.  LUMBAR SPECIAL TESTS:  Equal leg lengths.  (-) SLR testing.  GAIT: WNL.  TODAY's TX:  HMP and IFC at 80-150 Hz on 40% scan x 15 minutes to patient's left UT region. Patient tolerated treatment without complaint with normal modality response following removal of modality.    ASSESSMENT:  CLINICAL IMPRESSION: Patient presents to with chronic left shoulder pain since 2012.  Her pai is rated at a 6/10 today with increased pain with increased use of her left UE.  She has essentially normal left shoulder active range of motion.  Sligth decrease in ER graded at 4+/5.  Good behaind the back motion.  UE DTR's 1+ to 2+/10.  Mild pain reproduction with a left shoulder Impingement test.  She has a great deal of tone and tenderness over her left UT and Levator Scapulae lessening into the left cervical paraspinal musculature.  Discussed ry needling and provided her her a handout/consent form.    OBJECTIVE IMPAIRMENTS: decreased activity tolerance, decreased ROM, increased muscle spasms, postural dysfunction, and pain.   ACTIVITY LIMITATIONS: carrying, lifting, bending, sitting, and standing  PARTICIPATION LIMITATIONS: meal prep, cleaning, laundry, and yard work  PERSONAL FACTORS: Time since onset of injury/illness/exacerbation and 1 comorbidity: Fibromyalgia  are also affecting patient's functional outcome.   REHAB POTENTIAL: Good  CLINICAL DECISION MAKING: Stable/uncomplicated  EVALUATION COMPLEXITY: Low  GOALS:  SHORT TERM GOALS: Target date: 05/17/22  Ind with an initial HEP. Goal status: MET  LONG TERM GOALS: Target date: 08/01/22.  Ind with an advanced HEP.   Goal status: On-going  2.  Perform ADL's with pain not > 4/10.  Goal status: On-going  3.  Eliminate left LE pain.  Goal status: On-going  4.  Perform ADL's with left shoulder pain not > 3-4/10.  PLAN:  PT FREQUENCY: 2x/week  PT DURATION: 6 weeks  PLANNED INTERVENTIONS: Therapeutic exercises, Therapeutic activity, Patient/Family education, Self Care, Dry Needling, Electrical stimulation, Cryotherapy, Moist heat, Ultrasound, and Manual therapy.  PLAN FOR NEXT SESSION: Continue with core exercise progression.  Combo e'stim/US and STW/M to left cervical musculature, dry needling.  WG9.    Halona Amstutz, [place], PT 05/26/2022, 1:47 PM

## 2022-06-01 ENCOUNTER — Encounter: Payer: Self-pay | Admitting: Physical Therapy

## 2022-06-01 ENCOUNTER — Ambulatory Visit: Payer: 59 | Admitting: Physical Therapy

## 2022-06-01 DIAGNOSIS — M25512 Pain in left shoulder: Secondary | ICD-10-CM | POA: Diagnosis not present

## 2022-06-01 DIAGNOSIS — G8929 Other chronic pain: Secondary | ICD-10-CM

## 2022-06-01 NOTE — Therapy (Signed)
OUTPATIENT PHYSICAL THERAPY THORACOLUMBAR TREATMENT   Patient Name: [name] MRN: [id number] DOB:[birth year], 62 y.o., female Today's Date: 06/01/2022  END OF SESSION:  PT End of Session - 06/01/22 1429     Visit Number 7    Number of Visits 12    Date for PT Re-Evaluation 08/01/22    Authorization Type FOTO AT LEAST EVERY 5TH VISIT.  PROGRESS NOTE AT 10TH VISIT.  KX MODIFIER AFTER 15 VISITS.    PT Start Time 0108    PT Stop Time 0158    PT Time Calculation (min) 50 min    Activity Tolerance Patient tolerated treatment well    Behavior During Therapy [facility] for tasks assessed/performed             Past Medical History:  Diagnosis Date   Anxiety    Arthritis    Constipation 02/17/2015   Depression, recurrent (HCC) 07/24/2020   Emphysema lung (HCC)    Fibromyalgia    GERD (gastroesophageal reflux disease)    Hypertension    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   Menopausal vaginal dryness 02/17/2015   Past Surgical History:  Procedure Laterality Date   CHOLECYSTECTOMY N/A 04/01/2016   Procedure: LAPAROSCOPIC CHOLECYSTECTOMY;  Surgeon: Franky Macho, MD;  Location: [facility];  Service: General;  Laterality: N/A;   COLONOSCOPY  11/2010   normal colonoscopy from anal verge to ileocecal valve; no polyps, masses, AVMs, diverticula, or evidence of inflammation   ESOPHAGOGASTRODUODENOSCOPY N/A 10/16/2013   WUJ:[id number] ring s/p dilation/2 cm HH. multiple 3 mm round and shallow erosions. 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 Hiatal hernia   LAPAROSCOPIC APPENDECTOMY N/A 06/21/2013   Procedure: APPENDECTOMY LAPAROSCOPIC;  Surgeon: Dalia Heading, MD;  Location: [facility];  Service: General;  Laterality: N/A;   MALONEY DILATION N/A 10/16/2013   Procedure: Alvy Beal;  Surgeon: Corbin Ade, MD;  Location: [facility];  Service: Endoscopy;  Laterality: N/ATod [place] PLACEMENT   [id number]   Rt   IJ  Port-A-Cath  [email or web address]   STERNAL WIRES REMOVAL  01/23/2012   Procedure: STERNAL WIRES REMOVAL;  Surgeon: Delight Ovens, MD;  Location: [facility] OR;  Service: Thoracic;  Laterality: N/A;   THORACOTOMY  [id number]   transsmanubrial anterolaterl thoracotomy with chest wall resection includinf the first rib and left  upper lobectomy for Pancoast turmor   TUBAL LIGATION     Patient Active Problem List   Diagnosis Date Noted   Left shoulder pain 05/15/2022   Chronic pain syndrome 02/16/2022   Chronic low back pain with sciatica 02/16/2022   Annual physical exam 04/26/2021   Insomnia 01/11/2021   Depression, recurrent (HCC) 07/24/2020   Controlled substance agreement signed 05/25/2020   Simple chronic bronchitis (HCC) 05/10/2019   Elevated LFTs 03/20/2019   Neuropathy 09/27/2016   DDD (degenerative disc disease), cervical 12/08/2015   History of lung cancer 12/08/2015   Cervical disc disorder with radiculopathy of cervical region 09/23/2015   Gastroesophageal reflux disease without esophagitis 03/19/2015   Menopausal vaginal dryness 02/17/2015   Constipation 02/17/2015   Esophageal dysphagia 09/25/2013   Hiccups 04/19/2013   Unspecified constipation 04/18/2013   Anxiety    Primary hypertension    REFERRING PROVIDER: Elijah Birk  REFERRING DIAG: Chronic low back pain with Sciatica.  Rationale for Evaluation and Treatment: Rehabilitation  THERAPY DIAG:  Chronic left shoulder pain  ONSET DATE: Many years.  SUBJECTIVE:  SUBJECTIVE STATEMENT:  Pain was at a 7-8/10 in left shoulder so I had to take medication.  PERTINENT HISTORY:  Fibromyalgia, HTN.    PAIN:  Are you having pain? 7-8/10, left shoulder, ache.  PRECAUTIONS: None  PATIENT GOALS: Better quality of life and more  energy.  OBJECTIVE:   PATIENT SURVEYS:  FOTO 53.  POSTURE: posterior pelvic tilt, decreased lumbar lordosis.  PALPATION: Tender to palpation and taut over left lumbar musculature.  LUMBAR ROM:  Full active lumbar flexion and extension to 15 degrees with pain increase.  LOWER EXTREMITY ROM:   WNL.  LOWER EXTREMITY MMT:   Normal LE strength.  LUMBAR SPECIAL TESTS:  Equal leg lengths.  (-) SLR testing.  GAIT: WNL.  TODAY's TX:  Trigger Point Dry-Needling  Treatment instructions: Expect mild to moderate muscle soreness. S/S of pneumothorax if dry needled over a lung field, and to seek immediate medical attention should they occur. Patient verbalized understanding of these instructions and education. Patient Consent Given: Yes Education handout provided: Yes Muscles treated: Left UT. Treatment response/outcome: Twitch.  F/b combo e'stim/US at 1.50 W/CM2 x 12 minutes f/b STW/M x 11 minutes with ischemic release technique utilized f/b HMP and IFC at 80-150 Hz on 40% scan x 15 minutes to patient's left UT region. Patient tolerated treatment without complaint with normal modality response following removal of modality.    ASSESSMENT:  CLINICAL IMPRESSION: The patient presented with a lot of pain over her left UT.  Excellent twitch response with dry needling.    OBJECTIVE IMPAIRMENTS: decreased activity tolerance, decreased ROM, increased muscle spasms, postural dysfunction, and pain.   ACTIVITY LIMITATIONS: carrying, lifting, bending, sitting, and standing  PARTICIPATION LIMITATIONS: meal prep, cleaning, laundry, and yard work  PERSONAL FACTORS: Time since onset of injury/illness/exacerbation and 1 comorbidity: Fibromyalgia  are also affecting patient's functional outcome.   REHAB POTENTIAL: Good  CLINICAL DECISION MAKING: Stable/uncomplicated  EVALUATION COMPLEXITY: Low  GOALS:  SHORT TERM GOALS: Target date: 05/17/22  Ind with an initial HEP. Goal status:  MET  LONG TERM GOALS: Target date: 08/01/22.  Ind with an advanced HEP.  Goal status: On-going  2.  Perform ADL's with pain not > 4/10.  Goal status: On-going  3.  Eliminate left LE pain.  Goal status: On-going  4.  Perform ADL's with left shoulder pain not > 3-4/10.  PLAN:  PT FREQUENCY: 2x/week  PT DURATION: 6 weeks  PLANNED INTERVENTIONS: Therapeutic exercises, Therapeutic activity, Patient/Family education, Self Care, Dry Needling, Electrical stimulation, Cryotherapy, Moist heat, Ultrasound, and Manual therapy.  PLAN FOR NEXT SESSION: Continue with core exercise progression.  Combo e'stim/US and STW/M to left cervical musculature, dry needling.  UJ8.    Sakiyah Shur, [place], PT 06/01/2022, 2:38 PM

## 2022-06-13 ENCOUNTER — Encounter: Payer: 59 | Admitting: Physical Therapy

## 2022-06-16 ENCOUNTER — Encounter: Payer: 59 | Admitting: Physical Therapy

## 2022-06-23 ENCOUNTER — Ambulatory Visit: Payer: 59 | Admitting: Family Medicine

## 2022-06-24 ENCOUNTER — Encounter: Payer: Self-pay | Admitting: Family Medicine

## 2022-06-24 ENCOUNTER — Ambulatory Visit (INDEPENDENT_AMBULATORY_CARE_PROVIDER_SITE_OTHER): Payer: 59 | Admitting: Family Medicine

## 2022-06-24 VITALS — BP 105/65 | HR 63 | Temp 98.4°F | Ht 64.0 in | Wt 133.4 lb

## 2022-06-24 DIAGNOSIS — R634 Abnormal weight loss: Secondary | ICD-10-CM

## 2022-06-24 DIAGNOSIS — F419 Anxiety disorder, unspecified: Secondary | ICD-10-CM

## 2022-06-24 DIAGNOSIS — F5104 Psychophysiologic insomnia: Secondary | ICD-10-CM | POA: Diagnosis not present

## 2022-06-24 LAB — CMP14+EGFR

## 2022-06-24 LAB — THYROID PANEL WITH TSH

## 2022-06-24 LAB — ANEMIA PROFILE B
Basophils Absolute: 0.1 10*3/uL (ref 0.0–0.2)
Lymphs: 29 %
MCHC: 32.5 g/dL (ref 31.5–35.7)
Monocytes: 8 %
Retic Ct Pct: 2 % (ref 0.6–2.6)
WBC: 6 10*3/uL (ref 3.4–10.8)

## 2022-06-24 LAB — VITAMIN D 25 HYDROXY (VIT D DEFICIENCY, FRACTURES)

## 2022-06-24 MED ORDER — HYDROXYZINE PAMOATE 25 MG PO CAPS
25.0000 mg | ORAL_CAPSULE | Freq: Three times a day (TID) | ORAL | 0 refills | Status: DC | PRN
Start: 2022-06-24 — End: 2022-07-15

## 2022-06-24 NOTE — Progress Notes (Signed)
Acute Office Visit  Subjective:  Patient ID: [name], female    DOB: 09/19/1960, 62 y.o.   MRN: [id number]  Chief Complaint  Patient presents with   Medication Reaction   HPI Patient is in today for follow up of the Trazodone starts.  States that she has lost 10 lbs in one month.  She states that over the She states that she is taking one half pill, Falls asleep fast. She is able to wake up and is not drowsy. States she woke up one morning and was crying and depressed. She endorses a lot of negative self talk earlier this week, now states that she "feels fine". Of note reports that she took one of her husband's ambien last night. Reports that one day she had anxiety during the day and thought that the trazodone  might help, so she took one. Per patient, it made her sleepy and she took a nap. Denies SI. Reports that she is typically not a crier and is usually not depressed. Feels that she has had some memory loss as well since starting trazodone. States that she has had decreased libido since starting trazodone.   ROS As per HPI   Objective:  BP 105/65   Pulse 63   Temp 98.4 F (36.9 C)   Ht 5\' 4"  (1.626 m)   Wt 133 lb 6.4 oz (60.5 kg)   SpO2 97%   BMI 22.90 kg/m   Physical Exam Constitutional:      General: She is awake. She is not in acute distress.    Appearance: Normal appearance. She is well-developed and well-groomed. She is not ill-appearing, toxic-appearing or diaphoretic.  Cardiovascular:     Rate and Rhythm: Normal rate.     Pulses: Normal pulses.          Radial pulses are 2+ on the right side and 2+ on the left side.       Posterior tibial pulses are 2+ on the right side and 2+ on the left side.     Heart sounds: Normal heart sounds. No murmur heard.    No gallop.  Pulmonary:     Effort: Pulmonary effort is normal. No respiratory distress.     Breath sounds: Normal breath sounds. No stridor. No wheezing, rhonchi or rales.  Musculoskeletal:      Cervical back: Full passive range of motion without pain and neck supple.     Right lower leg: No edema.     Left lower leg: No edema.  Skin:    General: Skin is warm.     Capillary Refill: Capillary refill takes less than 2 seconds.  Neurological:     General: No focal deficit present.     Mental Status: She is alert, oriented to person, place, and time and easily aroused. Mental status is at baseline.     GCS: GCS eye subscore is 4. GCS verbal subscore is 5. GCS motor subscore is 6.     Motor: No weakness.  Psychiatric:        Attention and Perception: Attention and perception normal.        Mood and Affect: Mood and affect normal.        Speech: Speech normal.        Behavior: Behavior normal. Behavior is cooperative.        Thought Content: Thought content normal. Thought content does not include homicidal or suicidal ideation. Thought content does not include homicidal or suicidal plan.  Cognition and Memory: Cognition and memory normal.        Judgment: Judgment normal.       06/24/2022   11:14 AM 05/12/2022    4:01 PM 05/11/2022   10:36 AM  Depression screen PHQ 2/9  Decreased Interest 1 0 0  Down, Depressed, Hopeless 1 0 0  PHQ - 2 Score 2 0 0  Altered sleeping 0 0   Tired, decreased energy 1 0   Change in appetite 1 0   Feeling bad or failure about yourself  1 0   Trouble concentrating 1 0   Moving slowly or fidgety/restless 0 0   Suicidal thoughts 0 0   PHQ-9 Score 6 0   Difficult doing work/chores Not difficult at all Not difficult at all       06/24/2022   11:14 AM 05/12/2022    4:02 PM 01/26/2022   10:34 AM 09/28/2021    9:23 AM  GAD 7 : Generalized Anxiety Score  Nervous, Anxious, on Edge 1 0 0 1  Control/stop worrying 0 0 0 0  Worry too much - different things 0 0 0 0  Trouble relaxing 0 1 0 1  Restless 0 1 0 1  Easily annoyed or irritable 1 1 0 1  Afraid - awful might happen 0 0 0 0  Total GAD 7 Score 2 3 0 4  Anxiety Difficulty Not difficult at all  Not difficult at all Not difficult at all Not difficult at all    Assessment & Plan:  1. Psychophysiological insomnia 2. Anxiety Will discontinue trazodone and start vistaril as below. Discussed with patient the risk of serotonin syndrome. Given that patient would like something for as needed anxiety and for sleep, vistaril can be used for both situations over trazodone.  - hydrOXYzine (VISTARIL) 25 MG capsule; Take 1 capsule (25 mg total) by mouth every 8 (eight) hours as needed.  Dispense: 30 capsule; Refill: 0  3. Unintentional weight loss Given history of cancer want to monitor closely. Patient has lost 11 lbs since January 2024 per chart review. From last visit 05/12/22, patient has lost 3 lbs. Will monitor closely and begin with labs as below.  - Anemia Profile B; Future - CMP14+EGFR; Future - Thyroid Panel With TSH; Future - VITAMIN D 25 Hydroxy (Vit-D Deficiency, Fractures); Future  The above assessment and management plan was discussed with the patient. The patient verbalized understanding of and has agreed to the management plan using shared-decision making. Patient is aware to call the clinic if they develop any new symptoms or if symptoms fail to improve or worsen. Patient is aware when to return to the clinic for a follow-up visit. Patient educated on when it is appropriate to go to the emergency department.   Return in about 4 weeks (around 07/22/2022) for Chronic Condition Follow up.  Neale Burly, DNP-FNP [facility] [address] [place], [place] [address] [phone]

## 2022-06-25 LAB — ANEMIA PROFILE B
Basos: 2 %
EOS (ABSOLUTE): 0.5 10*3/uL — ABNORMAL HIGH (ref 0.0–0.4)
Eos: 8 %
Ferritin: 35 ng/mL (ref 15–150)
Folate: 6.7 ng/mL (ref >3.0)
Hematocrit: 33.8 % — ABNORMAL LOW (ref 34.0–46.6)
Hemoglobin: 11 g/dL — ABNORMAL LOW (ref 11.1–15.9)
Immature Grans (Abs): 0 10*3/uL (ref 0.0–0.1)
Immature Granulocytes: 0 %
Iron Saturation: 17 % (ref 15–55)
Lymphocytes Absolute: 1.8 10*3/uL (ref 0.7–3.1)
MCH: 28.6 pg (ref 26.6–33.0)
MCV: 88 fL (ref 79–97)
Monocytes Absolute: 0.5 10*3/uL (ref 0.1–0.9)
Neutrophils Absolute: 3.1 10*3/uL (ref 1.4–7.0)
Neutrophils: 53 %
Platelets: 289 10*3/uL (ref 150–450)
RBC: 3.84 x10E6/uL (ref 3.77–5.28)
RDW: 12.5 % (ref 11.7–15.4)
Total Iron Binding Capacity: 345 ug/dL (ref 250–450)
UIBC: 287 ug/dL (ref 118–369)
Vitamin B-12: 563 pg/mL (ref 232–1245)

## 2022-06-25 LAB — CMP14+EGFR
BUN: 21 mg/dL (ref 8–27)
Bilirubin Total: 0.4 mg/dL (ref 0.0–1.2)
CO2: 24 mmol/L (ref 20–29)
Calcium: 9.4 mg/dL (ref 8.7–10.3)
Chloride: 100 mmol/L (ref 96–106)
Creatinine, Ser: 1.03 mg/dL — ABNORMAL HIGH (ref 0.57–1.00)
Glucose: 116 mg/dL — ABNORMAL HIGH (ref 70–99)
eGFR: 61 mL/min/{1.73_m2} (ref >59)

## 2022-06-25 LAB — THYROID PANEL WITH TSH
Free Thyroxine Index: 1.9 (ref 1.2–4.9)
T3 Uptake Ratio: 24 % (ref 24–39)

## 2022-07-06 NOTE — Progress Notes (Signed)
Hgb and Hct slightly low and EOS slightly elevated. Some variation in labs is normal, will continue to follow. Will collect A1C at follow up appt. Alk phos likely elevated due to non fasting status. In the future, will collect while fasting and can add on additional labs to make sure it is not from another cause. Crt slightly elevated, may be due to dehydration. Recommend 80-100 oz of water per day.

## 2022-07-11 ENCOUNTER — Other Ambulatory Visit: Payer: Self-pay | Admitting: Family Medicine

## 2022-07-11 DIAGNOSIS — I10 Essential (primary) hypertension: Secondary | ICD-10-CM

## 2022-07-15 ENCOUNTER — Other Ambulatory Visit: Payer: Self-pay | Admitting: Family Medicine

## 2022-07-15 DIAGNOSIS — F419 Anxiety disorder, unspecified: Secondary | ICD-10-CM

## 2022-07-15 DIAGNOSIS — F5104 Psychophysiologic insomnia: Secondary | ICD-10-CM

## 2022-08-09 ENCOUNTER — Other Ambulatory Visit: Payer: Self-pay | Admitting: Family Medicine

## 2022-08-09 DIAGNOSIS — F339 Major depressive disorder, recurrent, unspecified: Secondary | ICD-10-CM

## 2022-08-31 ENCOUNTER — Other Ambulatory Visit: Payer: Self-pay | Admitting: Family Medicine

## 2022-08-31 DIAGNOSIS — F5104 Psychophysiologic insomnia: Secondary | ICD-10-CM

## 2022-08-31 DIAGNOSIS — F419 Anxiety disorder, unspecified: Secondary | ICD-10-CM

## 2022-09-08 ENCOUNTER — Other Ambulatory Visit: Payer: Self-pay | Admitting: Family Medicine

## 2022-09-08 DIAGNOSIS — F339 Major depressive disorder, recurrent, unspecified: Secondary | ICD-10-CM

## 2022-09-23 ENCOUNTER — Other Ambulatory Visit: Payer: Self-pay | Admitting: Family Medicine

## 2022-09-23 DIAGNOSIS — F5104 Psychophysiologic insomnia: Secondary | ICD-10-CM

## 2022-09-23 DIAGNOSIS — F419 Anxiety disorder, unspecified: Secondary | ICD-10-CM

## 2022-09-27 ENCOUNTER — Ambulatory Visit (INDEPENDENT_AMBULATORY_CARE_PROVIDER_SITE_OTHER): Payer: 59 | Admitting: Family Medicine

## 2022-09-27 ENCOUNTER — Encounter: Payer: Self-pay | Admitting: Family Medicine

## 2022-09-27 VITALS — BP 128/84 | HR 58 | Temp 97.8°F | Ht 64.0 in | Wt 137.0 lb

## 2022-09-27 DIAGNOSIS — R739 Hyperglycemia, unspecified: Secondary | ICD-10-CM | POA: Diagnosis not present

## 2022-09-27 DIAGNOSIS — F5104 Psychophysiologic insomnia: Secondary | ICD-10-CM | POA: Diagnosis not present

## 2022-09-27 DIAGNOSIS — I10 Essential (primary) hypertension: Secondary | ICD-10-CM | POA: Diagnosis not present

## 2022-09-27 DIAGNOSIS — R899 Unspecified abnormal finding in specimens from other organs, systems and tissues: Secondary | ICD-10-CM | POA: Diagnosis not present

## 2022-09-27 DIAGNOSIS — F339 Major depressive disorder, recurrent, unspecified: Secondary | ICD-10-CM | POA: Diagnosis not present

## 2022-09-27 DIAGNOSIS — K219 Gastro-esophageal reflux disease without esophagitis: Secondary | ICD-10-CM | POA: Diagnosis not present

## 2022-09-27 DIAGNOSIS — E782 Mixed hyperlipidemia: Secondary | ICD-10-CM

## 2022-09-27 DIAGNOSIS — F419 Anxiety disorder, unspecified: Secondary | ICD-10-CM | POA: Diagnosis not present

## 2022-09-27 MED ORDER — HYDROXYZINE PAMOATE 25 MG PO CAPS
25.0000 mg | ORAL_CAPSULE | Freq: Three times a day (TID) | ORAL | 1 refills | Status: DC | PRN
Start: 2022-09-27 — End: 2023-03-07

## 2022-09-27 NOTE — Progress Notes (Signed)
Subjective:  Patient ID: [name], female    DOB: Jan 03, 1961, 62 y.o.   MRN: [id number]  Patient Care Team: Arrie Senate, FNP as PCP - General (Family Medicine) Ubaldo Glassing, Lebron Conners, MD (Hematology and Oncology) Hart Robinsons, DO Rourk, Gerrit Friends, MD as Consulting Physician (Gastroenterology)   Chief Complaint:  Medical Management of Chronic Issues ( Medication refills/Labwork - pt is NPO)  HPI: [name] is a 62 y.o. female presenting on 09/27/2022 for Medical Management of Chronic Issues ( Medication refills/Labwork - pt is NPO) 1. Primary hypertension Has BP monitor at home Yes - only takes it sometimes.  BP at home average 172/80 - patient is unsure if this is correct or not. Reports that she has not checked her BP in a while.  ROS Denies anxiety, fatigue, peripheral edema, changes to vision, chest pain, headaches, sweats, PND, orthopnea Endorses SOB and palpitations  Meds hydrochlorothiazide and losartan  CAD risks COPD  2. Gastroesophageal reflux disease without esophagitis States that this is improved. Taking mylanta OTC. Taking this 2-3 per week.  Denies trouble swallowing, hematemesis and hematochezia   3. Anxiety/ Depression, recurrent (HCC) Reports that she is only working 2 days a week, so it is much improved. She is only using vistaril for sleep. Is only taking one.   4. Psychophysiological insomnia Reports that vistaril is improving her sleep. Taking nightly. Sleeping through the night. States that she may get up to urinate, but is able to fall asleep quickly after waking.   Relevant past medical, surgical, family, and social history reviewed and updated as indicated.  Allergies and medications reviewed and updated. Data reviewed: Chart in Epic.   Past Medical History:  Diagnosis Date   Anxiety    Arthritis    Constipation 02/17/2015   Depression, recurrent (HCC) 07/24/2020   Emphysema lung (HCC)    Fibromyalgia    GERD  (gastroesophageal reflux disease)    Hypertension    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   Menopausal vaginal dryness 02/17/2015    Past Surgical History:  Procedure Laterality Date   CHOLECYSTECTOMY N/A 04/01/2016   Procedure: LAPAROSCOPIC CHOLECYSTECTOMY;  Surgeon: Franky Macho, MD;  Location: [facility];  Service: General;  Laterality: N/A;   COLONOSCOPY  11/2010   normal colonoscopy from anal verge to ileocecal valve; no polyps, masses, AVMs, diverticula, or evidence of inflammation   ESOPHAGOGASTRODUODENOSCOPY N/A 10/16/2013   NWG:NFAOZHYQ ring s/p dilation/2 cm HH. multiple 3 mm round and shallow erosions. 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 Hiatal hernia   LAPAROSCOPIC APPENDECTOMY N/A 06/21/2013   Procedure: APPENDECTOMY LAPAROSCOPIC;  Surgeon: Dalia Heading, MD;  Location: [facility];  Service: General;  Laterality: N/A;   MALONEY DILATION N/A 10/16/2013   Procedure: Alvy Beal;  Surgeon: Corbin Ade, MD;  Location: [facility];  Service: Endoscopy;  Laterality: N/ATod [place] PLACEMENT  [id number]   Rt   IJ  Port-A-Cath  [email or web address]   STERNAL WIRES REMOVAL  01/23/2012   Procedure: STERNAL WIRES REMOVAL;  Surgeon: Delight Ovens, MD;  Location: [facility] OR;  Service: Thoracic;  Laterality: N/A;   THORACOTOMY  [id number]   transsmanubrial anterolaterl thoracotomy with chest wall resection includinf the first rib and left  upper lobectomy for Pancoast turmor   TUBAL LIGATION      Social History   Socioeconomic History   Marital status: Married  Spouse name: Not on file   Number of children: Not on file   Years of education: Not on file   Highest education level: 12th grade  Occupational History   Not on file  Tobacco Use   Smoking status: Former    Current packs/day: 0.00    Average packs/day: 1.5 packs/day for 35.0 years (52.5 ttl pk-yrs)    Types: Cigarettes    Start date:  02/05/1975    Quit date: 02/04/2010    Years since quitting: 12.6   Smokeless tobacco: Never   Tobacco comments:    Quit x 8 years  Vaping Use   Vaping status: Never Used  Substance and Sexual Activity   Alcohol use: Yes    Alcohol/week: 0.0 standard drinks of alcohol    Comment: occasionally    Drug use: No   Sexual activity: Yes    Birth control/protection: Post-menopausal  Other Topics Concern   Not on file  Social History Narrative   Not on file   Social Determinants of Health   Financial Resource Strain: Low Risk  (05/08/2022)   Overall Financial Resource Strain (CARDIA)    Difficulty of Paying Living Expenses: Not hard at all  Food Insecurity: No Food Insecurity (05/08/2022)   Hunger Vital Sign    Worried About Running Out of Food in the Last Year: Never true    Ran Out of Food in the Last Year: Never true  Transportation Needs: No Transportation Needs (05/08/2022)   PRAPARE - Administrator, Civil Service (Medical): No    Lack of Transportation (Non-Medical): No  Physical Activity: Unknown (05/08/2022)   Exercise Vital Sign    Days of Exercise per Week: Patient declined    Minutes of Exercise per Session: Not on file  Stress: No Stress Concern Present (05/08/2022)   [employer] of Occupational Health - Occupational Stress Questionnaire    Feeling of Stress : Not at all  Social Connections: Socially Integrated (05/08/2022)   Social Connection and Isolation Panel [NHANES]    Frequency of Communication with Friends and Family: More than three times a week    Frequency of Social Gatherings with Friends and Family: More than three times a week    Attends Religious Services: More than 4 times per year    Active Member of [employer] or Organizations: Yes    Attends Banker Meetings: More than 4 times per year    Marital Status: Married  Catering manager Violence: Unknown (04/29/2021)   Received from [facility]   HITS    Physically Hurt: Not on file     Insult or Talk Down To: Not on file    Threaten Physical Harm: Not on file    Scream or Curse: Not on file    Outpatient Encounter Medications as of 09/27/2022  Medication Sig   escitalopram (LEXAPRO) 20 MG tablet Take 1 tablet (20 mg total) by mouth daily. **NEEDS TO BE SEEN BEFORE NEXT REFILL**   hydrochlorothiazide (HYDRODIURIL) 25 MG tablet TAKE ONE TABLET ONCE DAILY   HYDROcodone-acetaminophen (NORCO) 5-325 MG tablet Take 0.5-1 tablets by mouth daily as needed for severe pain.   [START ON 10/08/2022] HYDROcodone-acetaminophen (NORCO/VICODIN) 5-325 MG tablet Take 0.5-1 tablets by mouth 2 (two) times daily as needed for severe pain.   HYDROcodone-acetaminophen (NORCO/VICODIN) 5-325 MG tablet Take 0.5-1 tablets by mouth 2 (two) times daily as needed for severe pain.   hydrOXYzine (VISTARIL) 25 MG capsule Take 1 capsule (25 mg total) by mouth  every 8 (eight) hours as needed. **NEEDS TO BE SEEN BEFORE NEXT REFILL**   losartan (COZAAR) 100 MG tablet Take 1 tablet (100 mg total) by mouth daily.   No facility-administered encounter medications on file as of 09/27/2022.    Allergies  Allergen Reactions   Lisinopril Swelling    Of lips Of lips   Nitrofuran Derivatives     Review of Systems As per HPI   Objective:  BP 128/84   Pulse (!) 58   Temp 97.8 F (36.6 C)   Ht 5\' 4"  (1.626 m)   Wt 137 lb (62.1 kg)   SpO2 96%   BMI 23.52 kg/m    Wt Readings from Last 3 Encounters:  09/27/22 137 lb (62.1 kg)  06/24/22 133 lb 6.4 oz (60.5 kg)  05/12/22 136 lb (61.7 kg)    Physical Exam Constitutional:      General: She is awake. She is not in acute distress.    Appearance: Normal appearance. She is well-developed and well-groomed. She is not ill-appearing, toxic-appearing or diaphoretic.  Cardiovascular:     Rate and Rhythm: Regular rhythm. Bradycardia present.     Pulses: Normal pulses.          Radial pulses are 2+ on the right side and 2+ on the left side.       Posterior tibial  pulses are 2+ on the right side and 2+ on the left side.     Heart sounds: Normal heart sounds. No murmur heard.    No gallop.  Pulmonary:     Effort: Pulmonary effort is normal. No respiratory distress.     Breath sounds: Normal breath sounds. No stridor. No wheezing, rhonchi or rales.  Abdominal:     General: Abdomen is flat. Bowel sounds are normal.     Palpations: Abdomen is soft.  Musculoskeletal:     Cervical back: Full passive range of motion without pain and neck supple.     Right lower leg: No edema.     Left lower leg: No edema.  Skin:    General: Skin is warm.     Capillary Refill: Capillary refill takes less than 2 seconds.  Neurological:     General: No focal deficit present.     Mental Status: She is alert, oriented to person, place, and time and easily aroused. Mental status is at baseline.     GCS: GCS eye subscore is 4. GCS verbal subscore is 5. GCS motor subscore is 6.     Motor: No weakness.  Psychiatric:        Attention and Perception: Attention and perception normal.        Mood and Affect: Mood and affect normal.        Speech: Speech normal.        Behavior: Behavior normal. Behavior is cooperative.        Thought Content: Thought content normal. Thought content does not include homicidal or suicidal ideation. Thought content does not include homicidal or suicidal plan.        Cognition and Memory: Cognition and memory normal.        Judgment: Judgment normal.     Results for orders placed or performed in visit on 06/24/22  VITAMIN D 25 Hydroxy (Vit-D Deficiency, Fractures)  Result Value Ref Range   Vit D, 25-Hydroxy 39.9 30.0 - 100.0 ng/mL  Anemia Profile B  Result Value Ref Range   Total Iron Binding Capacity 345 250 - 450 ug/dL  UIBC 287 118 - 369 ug/dL   Iron 58 27 - 161 ug/dL   Iron Saturation 17 15 - 55 %   Ferritin 35 15 - 150 ng/mL   Vitamin B-12 563 232 - 1,245 pg/mL   Folate 6.7 >3.0 ng/mL   WBC 6.0 3.4 - 10.8 x10E3/uL   RBC 3.84 3.77 -  5.28 x10E6/uL   Hemoglobin 11.0 (L) 11.1 - 15.9 g/dL   Hematocrit 09.6 (L) 04.5 - 46.6 %   MCV 88 79 - 97 fL   MCH 28.6 26.6 - 33.0 pg   MCHC 32.5 31.5 - 35.7 g/dL   RDW 40.9 81.1 - 91.4 %   Platelets 289 150 - 450 x10E3/uL   Neutrophils 53 Not Estab. %   Lymphs 29 Not Estab. %   Monocytes 8 Not Estab. %   Eos 8 Not Estab. %   Basos 2 Not Estab. %   Neutrophils Absolute 3.1 1.4 - 7.0 x10E3/uL   Lymphocytes Absolute 1.8 0.7 - 3.1 x10E3/uL   Monocytes Absolute 0.5 0.1 - 0.9 x10E3/uL   EOS (ABSOLUTE) 0.5 (H) 0.0 - 0.4 x10E3/uL   Basophils Absolute 0.1 0.0 - 0.2 x10E3/uL   Immature Granulocytes 0 Not Estab. %   Immature Grans (Abs) 0.0 0.0 - 0.1 x10E3/uL   Retic Ct Pct 2.0 0.6 - 2.6 %  CMP14+EGFR  Result Value Ref Range   Glucose 116 (H) 70 - 99 mg/dL   BUN 21 8 - 27 mg/dL   Creatinine, Ser 7.82 (H) 0.57 - 1.00 mg/dL   eGFR 61 >95 AO/ZHY/8.65   BUN/Creatinine Ratio 20 12 - 28   Sodium 138 134 - 144 mmol/L   Potassium 3.7 3.5 - 5.2 mmol/L   Chloride 100 96 - 106 mmol/L   CO2 24 20 - 29 mmol/L   Calcium 9.4 8.7 - 10.3 mg/dL   Total Protein 6.8 6.0 - 8.5 g/dL   Albumin 4.2 3.9 - 4.9 g/dL   Globulin, Total 2.6 1.5 - 4.5 g/dL   Albumin/Globulin Ratio 1.6 1.2 - 2.2   Bilirubin Total 0.4 0.0 - 1.2 mg/dL   Alkaline Phosphatase 198 (H) 44 - 121 IU/L   AST 29 0 - 40 IU/L   ALT 29 0 - 32 IU/L  Thyroid Panel With TSH  Result Value Ref Range   TSH 1.740 0.450 - 4.500 uIU/mL   T4, Total 7.8 4.5 - 12.0 ug/dL   T3 Uptake Ratio 24 24 - 39 %   Free Thyroxine Index 1.9 1.2 - 4.9          09/27/2022   10:23 AM 06/24/2022   11:14 AM 05/12/2022    4:01 PM 05/11/2022   10:36 AM 03/23/2022    9:32 AM  Depression screen PHQ 2/9  Decreased Interest 0 1 0 0 0  Down, Depressed, Hopeless 0 1 0 0 0  PHQ - 2 Score 0 2 0 0 0  Altered sleeping 0 0 0    Tired, decreased energy 1 1 0    Change in appetite 0 1 0    Feeling bad or failure about yourself  0 1 0    Trouble concentrating 0 1 0     Moving slowly or fidgety/restless 0 0 0    Suicidal thoughts 0 0 0    PHQ-9 Score 1 6 0    Difficult doing work/chores Not difficult at all Not difficult at all Not difficult at all         09/27/2022  10:24 AM 06/24/2022   11:14 AM 05/12/2022    4:02 PM 01/26/2022   10:34 AM  GAD 7 : Generalized Anxiety Score  Nervous, Anxious, on Edge 1 1 0 0  Control/stop worrying 0 0 0 0  Worry too much - different things 0 0 0 0  Trouble relaxing 1 0 1 0  Restless 0 0 1 0  Easily annoyed or irritable 1 1 1  0  Afraid - awful might happen 0 0 0 0  Total GAD 7 Score 3 2 3  0  Anxiety Difficulty Not difficult at all Not difficult at all Not difficult at all Not difficult at all    Pertinent labs & imaging results that were available during my care of the patient were reviewed by me and considered in my medical decision making.  Assessment & Plan:  [name] was seen today for medical management of chronic issues.  Diagnoses and all orders for this visit:  Primary hypertension Well controlled in office today. Patient reported elevated readings at home. Patient to maintain BP log for one week and report measurements. If abnormal at home, would like for patient to schedule a triage RN appt to calibrate BP machine.  -     CMP14+EGFR -     Anemia Profile B  Gastroesophageal reflux disease without esophagitis Well controlled. Continue current regimen.   Anxiety Well controlled. Denies SI. Safety contract established. Refills as below.  -     hydrOXYzine (VISTARIL) 25 MG capsule; Take 1 capsule (25 mg total) by mouth every 8 (eight) hours as needed.  Depression, recurrent (HCC) As above   Psychophysiological insomnia Improved and well controlled. Refill provided.  -     hydrOXYzine (VISTARIL) 25 MG capsule; Take 1 capsule (25 mg total) by mouth every 8 (eight) hours as needed.  Mixed hyperlipidemia Labs as below. Will communicate results to patient once available. Will await results to  determine next steps.  Fasting  -     Lipid panel  Abnormal laboratory test result Anemia profile as above to monitor Hgb/hct    Continue all other maintenance medications.  Follow up plan: Return in about 6 months (around 03/27/2023) for Chronic Condition Follow up.   Continue healthy lifestyle choices, including diet (rich in fruits, vegetables, and lean proteins, and low in salt and simple carbohydrates) and exercise (at least 30 minutes of moderate physical activity daily).  Written and verbal instructions provided   The above assessment and management plan was discussed with the patient. The patient verbalized understanding of and has agreed to the management plan. Patient is aware to call the clinic if they develop any new symptoms or if symptoms persist or worsen. Patient is aware when to return to the clinic for a follow-up visit. Patient educated on when it is appropriate to go to the emergency department.   Neale Burly, DNP-FNP [facility] [address] [place], [place] [address] [phone]

## 2022-09-27 NOTE — Patient Instructions (Signed)

## 2022-09-28 ENCOUNTER — Encounter: Payer: Self-pay | Admitting: Family Medicine

## 2022-09-28 LAB — CMP14+EGFR
ALT: 38 IU/L — ABNORMAL HIGH (ref 0–32)
AST: 36 IU/L (ref 0–40)
Albumin: 4 g/dL (ref 3.9–4.9)
Alkaline Phosphatase: 220 IU/L — ABNORMAL HIGH (ref 44–121)
BUN/Creatinine Ratio: 25 (ref 12–28)
BUN: 22 mg/dL (ref 8–27)
Bilirubin Total: 0.4 mg/dL (ref 0.0–1.2)
CO2: 27 mmol/L (ref 20–29)
Calcium: 9.3 mg/dL (ref 8.7–10.3)
Chloride: 102 mmol/L (ref 96–106)
Creatinine, Ser: 0.89 mg/dL (ref 0.57–1.00)
Globulin, Total: 3 g/dL (ref 1.5–4.5)
Glucose: 102 mg/dL — ABNORMAL HIGH (ref 70–99)
Potassium: 3.7 mmol/L (ref 3.5–5.2)
Sodium: 141 mmol/L (ref 134–144)
Total Protein: 7 g/dL (ref 6.0–8.5)
eGFR: 73 mL/min/{1.73_m2} (ref >59)

## 2022-09-28 LAB — LIPID PANEL
Chol/HDL Ratio: 3 ratio (ref 0.0–4.4)
Cholesterol, Total: 227 mg/dL — ABNORMAL HIGH (ref 100–199)
HDL: 76 mg/dL (ref >39)
LDL Chol Calc (NIH): 134 mg/dL — ABNORMAL HIGH (ref 0–99)
Triglycerides: 97 mg/dL (ref 0–149)
VLDL Cholesterol Cal: 17 mg/dL (ref 5–40)

## 2022-09-28 LAB — ANEMIA PROFILE B
Basophils Absolute: 0.1 10*3/uL (ref 0.0–0.2)
Basos: 2 %
EOS (ABSOLUTE): 0.5 10*3/uL — ABNORMAL HIGH (ref 0.0–0.4)
Eos: 10 %
Ferritin: 31 ng/mL (ref 15–150)
Folate: 10 ng/mL (ref >3.0)
Hematocrit: 40.1 % (ref 34.0–46.6)
Hemoglobin: 12.8 g/dL (ref 11.1–15.9)
Immature Grans (Abs): 0 10*3/uL (ref 0.0–0.1)
Immature Granulocytes: 0 %
Iron Saturation: 20 % (ref 15–55)
Iron: 73 ug/dL (ref 27–139)
Lymphocytes Absolute: 1.3 10*3/uL (ref 0.7–3.1)
Lymphs: 28 %
MCH: 27.9 pg (ref 26.6–33.0)
MCHC: 31.9 g/dL (ref 31.5–35.7)
MCV: 88 fL (ref 79–97)
Monocytes Absolute: 0.5 10*3/uL (ref 0.1–0.9)
Monocytes: 10 %
Neutrophils Absolute: 2.2 10*3/uL (ref 1.4–7.0)
Neutrophils: 50 %
Platelets: 272 10*3/uL (ref 150–450)
RBC: 4.58 x10E6/uL (ref 3.77–5.28)
RDW: 11.9 % (ref 11.7–15.4)
Retic Ct Pct: 1.4 % (ref 0.6–2.6)
Total Iron Binding Capacity: 365 ug/dL (ref 250–450)
UIBC: 292 ug/dL (ref 118–369)
Vitamin B-12: 675 pg/mL (ref 232–1245)
WBC: 4.5 10*3/uL (ref 3.4–10.8)

## 2022-09-28 NOTE — Addendum Note (Signed)
Addended by: Neale Burly on: 09/28/2022 08:19 AM   Modules accepted: Orders

## 2022-09-28 NOTE — Progress Notes (Signed)
Can we add on A1C? Also would like patient to complete isoenzymes and hepatitis panel for LFTs as they remain elevated. Cholesterol is elevated. Diet encouraged - increase intake of fresh fruits and vegetables, increase intake of lean proteins. Bake, broil, or grill foods. Avoid fried, greasy, and fatty foods. Avoid fast foods. Increase intake of fiber-rich whole grains. Exercise encouraged - at least 150 minutes per week and advance as tolerated. Given elevated LFTs would not recommend starting Red yeast rice or statin at this time.  ASCVD score is in borderline category.  Iron studies are normal, hgb/hct resolved.   The 10-year ASCVD risk score (Arnett DK, et al., 2019) is: 5.5%   Values used to calculate the score:     Age: 62 years     Sex: Female     Is Non-Hispanic African American: No     Diabetic: No     Tobacco smoker: No     Systolic Blood Pressure: 128 mmHg     Is BP treated: Yes     HDL Cholesterol: 76 mg/dL     Total Cholesterol: 227 mg/dL

## 2022-09-29 ENCOUNTER — Other Ambulatory Visit: Payer: 59

## 2022-09-29 DIAGNOSIS — R748 Abnormal levels of other serum enzymes: Secondary | ICD-10-CM

## 2022-09-29 DIAGNOSIS — R899 Unspecified abnormal finding in specimens from other organs, systems and tissues: Secondary | ICD-10-CM | POA: Diagnosis not present

## 2022-09-30 LAB — SPECIMEN STATUS REPORT

## 2022-09-30 LAB — HGB A1C W/O EAG: Hgb A1c MFr Bld: 5.8 % — ABNORMAL HIGH (ref 4.8–5.6)

## 2022-09-30 NOTE — Progress Notes (Signed)
A1C remains in prediabetes range. Continue healthy lifestyle choices, including diet (rich in fruits, vegetables, and lean proteins, and low in salt and simple carbohydrates) and exercise (at least 30 minutes of moderate physical activity daily). Limit beverages high is sugar. Recommended at least 80-100 oz of water daily.

## 2022-10-01 LAB — HEPB+HEPC+HIV PANEL
HIV Screen 4th Generation wRfx: NONREACTIVE
Hep B C IgM: NEGATIVE
Hep B Core Total Ab: NEGATIVE
Hep B E Ab: NONREACTIVE
Hep B E Ag: NEGATIVE
Hep B Surface Ab, Qual: NONREACTIVE
Hep C Virus Ab: NONREACTIVE
Hepatitis B Surface Ag: NEGATIVE

## 2022-10-01 LAB — ALKALINE PHOSPHATASE, ISOENZYMES
Alkaline Phosphatase: 234 IU/L — ABNORMAL HIGH (ref 44–121)
BONE FRACTION: 26 % (ref 14–68)
INTESTINAL FRAC.: 4 % (ref 0–18)
LIVER FRACTION: 70 % (ref 18–85)

## 2022-10-04 NOTE — Progress Notes (Signed)
Continues to have elevated alk phos. Hepatitis panel and isoenzymes are normal. Will order [place] of liver for further workup.

## 2022-10-05 NOTE — Telephone Encounter (Signed)
No more labs at this time. Placed an order for US of the Liver to complete evaluation.

## 2022-10-11 ENCOUNTER — Other Ambulatory Visit: Payer: Self-pay | Admitting: Family Medicine

## 2022-10-11 DIAGNOSIS — F339 Major depressive disorder, recurrent, unspecified: Secondary | ICD-10-CM

## 2022-10-11 DIAGNOSIS — I10 Essential (primary) hypertension: Secondary | ICD-10-CM

## 2022-10-13 ENCOUNTER — Ambulatory Visit (HOSPITAL_COMMUNITY)
Admission: RE | Admit: 2022-10-13 | Discharge: 2022-10-13 | Disposition: A | Discharge status: Home / Self Care | Payer: 59 | Source: Ambulatory Visit | Attending: Family Medicine | Admitting: Family Medicine

## 2022-10-13 DIAGNOSIS — R748 Abnormal levels of other serum enzymes: Secondary | ICD-10-CM | POA: Diagnosis not present

## 2022-10-13 DIAGNOSIS — Z9049 Acquired absence of other specified parts of digestive tract: Secondary | ICD-10-CM | POA: Diagnosis not present

## 2022-10-13 NOTE — Progress Notes (Signed)
Per report, steatosis or fatty liver found on imaging. Recommend diet and exercise. We will work to improve blood pressure control, weight, and cholesterol. Recommend avoiding liver toxins such as alcohol and medications eliminated by the liver like tylenol. Notify the office if there are worsening symptoms of liver cirrhosis such as blood in your bowel movements or vomit, symptoms of infection, belly pain, swollen legs or ankles, trouble breathing, extreme tiredness, confusion, yellowing of the skin or whites of your eyes, called jaundice.

## 2022-10-24 ENCOUNTER — Telehealth: Payer: Self-pay | Admitting: Family Medicine

## 2022-10-25 ENCOUNTER — Ambulatory Visit (INDEPENDENT_AMBULATORY_CARE_PROVIDER_SITE_OTHER): Payer: 59 | Admitting: Family Medicine

## 2022-10-25 ENCOUNTER — Encounter: Payer: Self-pay | Admitting: Family Medicine

## 2022-10-25 VITALS — BP 121/75 | HR 60 | Temp 98.3°F | Ht 64.0 in | Wt 137.8 lb

## 2022-10-25 DIAGNOSIS — R21 Rash and other nonspecific skin eruption: Secondary | ICD-10-CM

## 2022-10-25 DIAGNOSIS — L0591 Pilonidal cyst without abscess: Secondary | ICD-10-CM | POA: Diagnosis not present

## 2022-10-25 MED ORDER — TRIAMCINOLONE ACETONIDE 0.025 % EX CREA
TOPICAL_CREAM | Freq: Two times a day (BID) | CUTANEOUS | 0 refills | Status: DC
Start: 2022-10-25 — End: 2023-03-07

## 2022-10-25 NOTE — Progress Notes (Signed)
Subjective:  Patient ID: [name], female    DOB: 1960/08/12, 62 y.o.   MRN: [id number]  Patient Care Team: Arrie Senate, FNP as PCP - General (Family Medicine) Ubaldo Glassing, Lebron Conners, MD (Hematology and Oncology) Hart Robinsons, DO Rourk, Gerrit Friends, MD as Consulting Physician (Gastroenterology)   Chief Complaint:  tailbone cyst  HPI: [name] is a 62 y.o. female presenting on 10/25/2022 for tailbone cyst States that she had a procedure in the office when she was 62 years old. States that she believes it was packed then. States that it flares every now and then, but she will improve with hot bath. Describes pain that started 2 weeks ago. States that it is not draining.  Denies fever, fatigue.   Relevant past medical, surgical, family, and social history reviewed and updated as indicated.  Allergies and medications reviewed and updated. Data reviewed: Chart in Epic.  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 multiple 3 mm round and shallow erosions. 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 Hiatal hernia   LAPAROSCOPIC APPENDECTOMY N/A 06/21/2013   Procedure:  APPENDECTOMY LAPAROSCOPIC;  Surgeon: Dalia Heading, MD;  Location: [facility];  Service: General;  Laterality: N/A;   MALONEY DILATION N/A 10/16/2013   Procedure: Alvy Beal;  Surgeon: Corbin Ade, MD;  [facility];  Service: Endoscopy;  Laterality: N/ATod [place] PLACEMENT  [id number]   Rt   IJ  Port-A-Cath  dr.burney   STERNAL WIRES REMOVAL  01/23/2012   Procedure: STERNAL WIRES REMOVAL;  Surgeon: Delight Ovens, MD;  Location: [facility];  Service: Thoracic;  Laterality: N/A;   THORACOTOMY  [id number]   transsmanubrial anterolaterl thoracotomy with chest wall resection includinf the first rib and left  upper lobectomy for Pancoast turmor   TUBAL LIGATION      Social History   Socioeconomic History   Marital status: Married    Spouse name: Not on file   Number of children: Not on file   Years of education: Not on file   Highest education level: 12th grade  Occupational History   Not on file  Tobacco Use   Smoking status: Former    Current packs/day: 0.00    Average packs/day: 1.5 packs/day for 35.0 years (52.5 ttl pk-yrs)    Types: Cigarettes    Start date: 02/05/1975    Quit date: 02/04/2010    Years since quitting: 12.7   Smokeless tobacco: Never   Tobacco comments:    Quit x 8 years  Vaping Use   Vaping status: Never Used  Substance and Sexual Activity   Alcohol use: Yes    Alcohol/week: 0.0 standard drinks of  alcohol    Comment: occasionally    Drug use: No   Sexual activity: Yes    Birth control/protection: Post-menopausal  Other Topics Concern   Not on file  Social History Narrative   Not on file   Social Determinants of Health   Financial Resource Strain: Low Risk  (05/08/2022)   Overall Financial Resource Strain (CARDIA)    Difficulty of Paying Living Expenses: Not hard at all  Food Insecurity: No Food Insecurity (05/08/2022)   Hunger Vital Sign    Worried About Running Out of Food in the Last Year: Never true    Ran Out of Food in the Last  Year: Never true  Transportation Needs: No Transportation Needs (05/08/2022)   PRAPARE - Administrator, Civil Service (Medical): No    Lack of Transportation (Non-Medical): No  Physical Activity: Unknown (05/08/2022)   Exercise Vital Sign    Days of Exercise per Week: Patient declined    Minutes of Exercise per Session: Not on file  Stress: No Stress Concern Present (05/08/2022)   [employer] of Occupational Health - Occupational Stress Questionnaire    Feeling of Stress : Not at all  Social Connections: Socially Integrated (05/08/2022)   Social Connection and Isolation Panel [NHANES]    Frequency of Communication with Friends and Family: More than three times a week    Frequency of Social Gatherings with Friends and Family: More than three times a week    Attends Religious Services: More than 4 times per year    Active Member of [employer] or Organizations: Yes    Attends Engineer, structural: More than 4 times per year    Marital Status: Married  Catering manager Violence: Unknown (04/29/2021)   Received from [employer], [facility]   HITS    Physically Hurt: Not on file    Insult or Talk Down To: Not on file    Threaten Physical Harm: Not on file    Scream or Curse: Not on file    Outpatient Encounter Medications as of 10/25/2022  Medication Sig   escitalopram (LEXAPRO) 20 MG tablet TAKE ONE TABLET DAILY   hydrochlorothiazide (HYDRODIURIL) 25 MG tablet TAKE ONE TABLET ONCE DAILY   HYDROcodone-acetaminophen (NORCO) 5-325 MG tablet Take 0.5-1 tablets by mouth daily as needed for severe pain.   HYDROcodone-acetaminophen (NORCO/VICODIN) 5-325 MG tablet Take 0.5-1 tablets by mouth 2 (two) times daily as needed for severe pain.   hydrOXYzine (VISTARIL) 25 MG capsule Take 1 capsule (25 mg total) by mouth every 8 (eight) hours as needed.   losartan (COZAAR) 100 MG tablet Take 1 tablet (100 mg total) by mouth daily.   No facility-administered encounter medications  on file as of 10/25/2022.    Allergies  Allergen Reactions   Lisinopril Swelling    Of lips Of lips   Nitrofuran Derivatives     Review of Systems As per HPI  Objective:  BP 121/75   Pulse 60   Temp 98.3 F (36.8 C)   Ht 5\' 4"  (1.626 m)   Wt 137 lb 12.8 oz (62.5 kg)   SpO2 95%   BMI 23.65 kg/m    Wt Readings from Last 3 Encounters:  10/25/22 137 lb 12.8 oz (62.5 kg)  09/27/22 137 lb (62.1 kg)  06/24/22 133 lb 6.4 oz (60.5 kg)   Physical Exam Constitutional:      General: She is awake. She is not in acute distress.    Appearance: Normal appearance. She  is well-developed and well-groomed. She is not ill-appearing, toxic-appearing or diaphoretic.  Cardiovascular:     Rate and Rhythm: Normal rate and regular rhythm.     Pulses: Normal pulses.          Radial pulses are 2+ on the right side and 2+ on the left side.       Posterior tibial pulses are 2+ on the right side and 2+ on the left side.     Heart sounds: Normal heart sounds. No murmur heard.    No gallop.  Pulmonary:     Effort: Pulmonary effort is normal. No respiratory distress.     Breath sounds: Normal breath sounds. No stridor. No wheezing, rhonchi or rales.  Genitourinary:    Pubic Area: No rash.        Comments: Small ~107mm nodule, nontender to touch, not draining, mobile medial to incisional scar.  Rash, excoriation present with scant serosanguinous fluid  Musculoskeletal:     Cervical back: Full passive range of motion without pain and neck supple.     Right lower leg: No edema.     Left lower leg: No edema.  Skin:    General: Skin is warm.     Capillary Refill: Capillary refill takes less than 2 seconds.  Neurological:     General: No focal deficit present.     Mental Status: She is alert, oriented to person, place, and time and easily aroused. Mental status is at baseline.     GCS: GCS eye subscore is 4. GCS verbal subscore is 5. GCS motor subscore is 6.     Motor: No weakness.  Psychiatric:         Attention and Perception: Attention and perception normal.        Mood and Affect: Mood and affect normal.        Speech: Speech normal.        Behavior: Behavior normal. Behavior is cooperative.        Thought Content: Thought content normal. Thought content does not include homicidal or suicidal ideation. Thought content does not include homicidal or suicidal plan.        Cognition and Memory: Cognition and memory normal.        Judgment: Judgment normal.    Results for orders placed or performed in visit on 09/29/22  Alkaline Phosphatase, Isoenzymes  Result Value Ref Range   Alkaline Phosphatase 234 (H) 44 - 121 IU/L   LIVER FRACTION 70 18 - 85 %   BONE FRACTION 26 14 - 68 %   INTESTINAL FRAC. 4 0 - 18 %  HepB+HepC+HIV Panel  Result Value Ref Range   Hepatitis B Surface Ag Negative Negative   Hep B E Ag Negative Negative   Hep B C IgM Negative Negative   Hep B Core Total Ab Negative Negative   Hep B E Ab Non Reactive Negative   Hep B Surface Ab, Qual Non Reactive    Hep C Virus Ab Non Reactive Non Reactive   HIV Screen 4th Generation wRfx Non Reactive Non Reactive       10/25/2022   10:06 AM 09/27/2022   10:23 AM 06/24/2022   11:14 AM 05/12/2022    4:01 PM 05/11/2022   10:36 AM  Depression screen PHQ 2/9  Decreased Interest 0 0 1 0 0  Down, Depressed, Hopeless 0 0 1 0 0  PHQ - 2 Score 0 0 2 0 0  Altered sleeping 0 0 0 0  Tired, decreased energy 1 1 1  0   Change in appetite 0 0 1 0   Feeling bad or failure about yourself  0 0 1 0   Trouble concentrating 0 0 1 0   Moving slowly or fidgety/restless 0 0 0 0   Suicidal thoughts 0 0 0 0   PHQ-9 Score 1 1 6  0   Difficult doing work/chores Somewhat difficult Not difficult at all Not difficult at all Not difficult at all        10/25/2022   10:06 AM 09/27/2022   10:24 AM 06/24/2022   11:14 AM 05/12/2022    4:02 PM  GAD 7 : Generalized Anxiety Score  Nervous, Anxious, on Edge 0 1 1 0  Control/stop worrying 0 0 0 0   Worry too much - different things  0 0 0  Trouble relaxing 1 1 0 1  Restless 1 0 0 1  Easily annoyed or irritable 1 1 1 1   Afraid - awful might happen 0 0 0 0  Total GAD 7 Score  3 2 3   Anxiety Difficulty Not difficult at all Not difficult at all Not difficult at all Not difficult at all   Pertinent labs & imaging results that were available during my care of the patient were reviewed by me and considered in my medical decision making.  Assessment & Plan:  [name] was seen today for tailbone cyst.  Diagnoses and all orders for this visit:  Cyst near coccyx Referral as below. Discussed supportive care to complete at home.  -     Ambulatory referral to General Surgery  Rash Medication as below. Discussed supportive care to complete at home.  -     triamcinolone (KENALOG) 0.025 % cream; Apply topically 2 (two) times daily.    Continue all other maintenance medications.  Follow up plan: Return if symptoms worsen or fail to improve.  Continue healthy lifestyle choices, including diet (rich in fruits, vegetables, and lean proteins, and low in salt and simple carbohydrates) and exercise (at least 30 minutes of moderate physical activity daily).  Written and verbal instructions provided   The above assessment and management plan was discussed with the patient. The patient verbalized understanding of and has agreed to the management plan. Patient is aware to call the clinic if they develop any new symptoms or if symptoms persist or worsen. Patient is aware when to return to the clinic for a follow-up visit. Patient educated on when it is appropriate to go to the emergency department.   Neale Burly, DNP-FNP [facility] [address] [place], [place] [address] [phone]

## 2022-10-26 NOTE — Telephone Encounter (Signed)
Pt seen 10/25/22 for this, will close encounter.

## 2022-11-03 ENCOUNTER — Other Ambulatory Visit: Payer: Self-pay | Admitting: Family Medicine

## 2022-11-03 DIAGNOSIS — I10 Essential (primary) hypertension: Secondary | ICD-10-CM

## 2022-11-10 ENCOUNTER — Ambulatory Visit: Payer: 59 | Admitting: General Surgery

## 2022-11-15 ENCOUNTER — Ambulatory Visit: Payer: 59 | Admitting: Registered Nurse

## 2022-11-25 ENCOUNTER — Encounter: Payer: Self-pay | Admitting: *Deleted

## 2022-12-06 ENCOUNTER — Encounter: Payer: Self-pay | Admitting: Registered Nurse

## 2022-12-06 ENCOUNTER — Encounter: Payer: 59 | Attending: Registered Nurse | Admitting: Registered Nurse

## 2022-12-06 VITALS — BP 136/72 | HR 75 | Ht 64.0 in | Wt 140.0 lb

## 2022-12-06 DIAGNOSIS — M25512 Pain in left shoulder: Secondary | ICD-10-CM | POA: Diagnosis not present

## 2022-12-06 DIAGNOSIS — G8929 Other chronic pain: Secondary | ICD-10-CM | POA: Insufficient documentation

## 2022-12-06 DIAGNOSIS — M545 Low back pain, unspecified: Secondary | ICD-10-CM | POA: Insufficient documentation

## 2022-12-06 DIAGNOSIS — Z79891 Long term (current) use of opiate analgesic: Secondary | ICD-10-CM | POA: Insufficient documentation

## 2022-12-06 DIAGNOSIS — G894 Chronic pain syndrome: Secondary | ICD-10-CM | POA: Insufficient documentation

## 2022-12-06 DIAGNOSIS — Z5181 Encounter for therapeutic drug level monitoring: Secondary | ICD-10-CM | POA: Diagnosis not present

## 2022-12-06 NOTE — Progress Notes (Signed)
Subjective:    Patient ID: [name], female    DOB: [birth year], 62 y.o.   MRN: [id number]  HPI: [name] is a 62 y.o. female who returns for follow up appointment for chronic pain and medication refill. She states her pain is located in her left shoulder and lower back pain. She rates her pain 7. Her current exercise regime is walking and performing stretching exercises  Ms. Brumm Morphine equivalent is 10.00 MME.   UDS ordered today.   PMP was Reviewed: [facility] noted: Ms. Proffer reports she was purchasing [facility] for her husband, she reports when she has purchased the [facility] in [place], [place], she was using her license. This was discussed with Dr. Shearon Stalls. We will wait for UDS results, if consistent. We will continue prescribing her Hydrocodone.     Pain Inventory Average Pain 6 Pain Right Now 7 My pain is intermittent, constant, dull, stabbing, and aching  In the last 24 hours, has pain interfered with the following? General activity 5 Relation with others 3 Enjoyment of life 3 What TIME of day is your pain at its worst? morning  and night Sleep (in general) Fair  Pain is worse with: sitting, inactivity, and standing Pain improves with: rest, heat/ice, and medication Relief from Meds: 8  Family History  Problem Relation Age of Onset   Cancer Mother        lung   Hypertension Father    Stroke Father    Hypertension Sister    Heart disease Sister    Cancer Brother 42       pancreatic   Colon cancer Neg Hx    Breast cancer Neg Hx    Social History   Socioeconomic History   Marital status: Married    Spouse name: Not on file   Number of children: Not on file   Years of education: Not on file   Highest education level: 12th grade  Occupational History   Not on file  Tobacco Use   Smoking status: Former    Current packs/day: 0.00    Average packs/day: 1.5 packs/day for 35.0 years (52.5 ttl pk-yrs)    Types: Cigarettes    Start date: 02/05/1975    Quit date:  02/04/2010    Years since quitting: 12.8   Smokeless tobacco: Never   Tobacco comments:    Quit x 8 years  Vaping Use   Vaping status: Never Used  Substance and Sexual Activity   Alcohol use: Yes    Alcohol/week: 0.0 standard drinks of alcohol    Comment: occasionally    Drug use: No   Sexual activity: Yes    Birth control/protection: Post-menopausal  Other Topics Concern   Not on file  Social History Narrative   Not on file   Social Determinants of Health   Financial Resource Strain: Low Risk  (05/08/2022)   Overall Financial Resource Strain (CARDIA)    Difficulty of Paying Living Expenses: Not hard at all  Food Insecurity: No Food Insecurity (05/08/2022)   Hunger Vital Sign    Worried About Running Out of Food in the Last Year: Never true    Ran Out of Food in the Last Year: Never true  Transportation Needs: No Transportation Needs (05/08/2022)   PRAPARE - Administrator, Civil Service (Medical): No    Lack of Transportation (Non-Medical): No  Physical Activity: Unknown (05/08/2022)   Exercise Vital Sign    Days of Exercise per Week: Patient declined  Minutes of Exercise per Session: Not on file  Stress: No Stress Concern Present (05/08/2022)   [employer] of Occupational Health - Occupational Stress Questionnaire    Feeling of Stress : Not at all  Social Connections: Socially Integrated (05/08/2022)   Social Connection and Isolation Panel [NHANES]    Frequency of Communication with Friends and Family: More than three times a week    Frequency of Social Gatherings with Friends and Family: More than three times a week    Attends Religious Services: More than 4 times per year    Active Member of Clubs or Organizations: Yes    Attends Banker Meetings: More than 4 times per year    Marital Status: Married   Past Surgical History:  Procedure Laterality Date   CHOLECYSTECTOMY N/A 04/01/2016   Procedure: LAPAROSCOPIC CHOLECYSTECTOMY;  Surgeon:  Franky Macho, MD;  Location: [facility];  Service: General;  Laterality: N/A;   COLONOSCOPY  11/2010   normal colonoscopy from anal verge to ileocecal valve; no polyps, masses, AVMs, diverticula, or evidence of inflammation   ESOPHAGOGASTRODUODENOSCOPY N/A 10/16/2013   UJW:JXBJYNWG ring s/p dilation/2 cm HH. multiple 3 mm round and shallow erosions. 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 Hiatal hernia   LAPAROSCOPIC APPENDECTOMY N/A 06/21/2013   Procedure: APPENDECTOMY LAPAROSCOPIC;  Surgeon: Dalia Heading, MD;  Location: [facility];  Service: General;  Laterality: N/A;   MALONEY DILATION N/A 10/16/2013   Procedure: Alvy Beal;  Surgeon: Corbin Ade, MD;  [facility];  Service: Endoscopy;  Laterality: N/ATod [place] PLACEMENT  [id number]   Rt   IJ  Port-A-Cath  dr.burney   STERNAL WIRES REMOVAL  01/23/2012   Procedure: STERNAL WIRES REMOVAL;  Surgeon: Delight Ovens, MD;  Location: [facility];  Service: Thoracic;  Laterality: N/A;   THORACOTOMY  [id number]   transsmanubrial anterolaterl thoracotomy with chest wall resection includinf the first rib and left  upper lobectomy for Pancoast turmor   TUBAL LIGATION     Past Surgical History:  Procedure Laterality Date   CHOLECYSTECTOMY N/A 04/01/2016   Procedure: LAPAROSCOPIC CHOLECYSTECTOMY;  Surgeon: Franky Macho, MD;  Location: [facility];  Service: General;  Laterality: N/A;   COLONOSCOPY  11/2010   normal colonoscopy from anal verge to ileocecal valve; no polyps, masses, AVMs, diverticula, or evidence of inflammation   ESOPHAGOGASTRODUODENOSCOPY N/A 10/16/2013   EXB:MWUXLKGM ring s/p dilation/2 cm HH. multiple 3 mm round and shallow erosions. 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 Hiatal hernia   LAPAROSCOPIC APPENDECTOMY N/A 06/21/2013   Procedure: APPENDECTOMY  LAPAROSCOPIC;  Surgeon: Dalia Heading, MD;  Location: [facility];  Service: General;  Laterality: N/A;   MALONEY DILATION N/A 10/16/2013   Procedure: Alvy Beal;  Surgeon: Corbin Ade, MD;  [facility];  Service: Endoscopy;  Laterality: N/ATod [place] PLACEMENT  [id number]   Rt   IJ  Port-A-Cath  dr.burney   STERNAL WIRES REMOVAL  01/23/2012   Procedure: STERNAL WIRES REMOVAL;  Surgeon: Delight Ovens, MD;  Location: [facility] OR;  Service: Thoracic;  Laterality: N/A;   THORACOTOMY  [id number]   transsmanubrial anterolaterl thoracotomy with chest wall resection includinf the first rib and left  upper lobectomy for Pancoast turmor   TUBAL LIGATION     Past Medical History:  Diagnosis Date   Anxiety    Arthritis  Constipation 02/17/2015   Depression, recurrent (HCC) 07/24/2020   Emphysema lung (HCC)    Fibromyalgia    GERD (gastroesophageal reflux disease)    Hypertension    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  Opioid Risk Score:   Fall Risk Score:  `1  Depression screen [facility] 2/9     10/25/2022   10:06 AM 09/27/2022   10:23 AM 06/24/2022   11:14 AM 05/12/2022    4:01 PM 05/11/2022   10:36 AM 03/23/2022    9:32 AM 02/16/2022   10:14 AM  Depression screen PHQ 2/9  Decreased Interest 0 0 1 0 0 0 0  Down, Depressed, Hopeless 0 0 1 0 0 0 0  PHQ - 2 Score 0 0 2 0 0 0 0  Altered sleeping 0 0 0 0   1  Tired, decreased energy 1 1 1  0   1  Change in appetite 0 0 1 0   0  Feeling bad or failure about yourself  0 0 1 0   0  Trouble concentrating 0 0 1 0   0  Moving slowly or fidgety/restless 0 0 0 0   0  Suicidal thoughts 0 0 0 0   0  PHQ-9 Score 1 1 6  0   2  Difficult doing work/chores Somewhat difficult Not difficult at all Not difficult at all Not difficult at all   Somewhat difficult    Review of Systems  Musculoskeletal:  Positive for back pain.       Left shoulder pain  All other  systems reviewed and are negative.      Objective:   Physical Exam Vitals and nursing note reviewed.  Constitutional:      Appearance: Normal appearance.  Cardiovascular:     Rate and Rhythm: Normal rate and regular rhythm.     Pulses: Normal pulses.     Heart sounds: Normal heart sounds.  Pulmonary:     Effort: Pulmonary effort is normal.     Breath sounds: Normal breath sounds.  Musculoskeletal:     Cervical back: Normal range of motion and neck supple.     Comments: Normal Muscle Bulk and Muscle Testing Reveals:  Upper Extremities: Right: Full ROM and Muscle Strength  5/5 Left Upper Extremity: Decreased ROM 90 Degrees and Muscle Strength 5/5 Left AC Joint Tenderness  Lumbar Paraspinal Tenderness: L-4-L-5 Lower Extremities: Full ROM and Muscle Strength 5/5 Arises from chair with ease Narrow Based  Gait     Skin:    General: Skin is warm and dry.  Neurological:     Mental Status: She is alert and oriented to person, place, and time.  Psychiatric:        Mood and Affect: Mood normal.        Behavior: Behavior normal.         Assessment & Plan:  Chronic Bilateral Low Back Pain without Sciatica: Continue HEP as Tolerated. Continue to Monitor.  Chronic Left Shoulder Pain: She has completed Physical Therapy. Continue HEP as Tolerated. Continue to Monitor.  Chronic Pain Syndrome: Continue Hydrocodone 5mg  /325 0.5- 1 tablet daily as needed for pain. We will continue the opioid monitoring program, this consists of regular clinic visits, examinations, urine drug screen, pill counts as well as use of [place] Controlled Substance Reporting system. A 12 month History has been reviewed on the [place] Controlled Substance  Reporting System on 12/06/2022    F/U in 3 months

## 2022-12-09 LAB — TOXASSURE SELECT,+ANTIDEPR,UR

## 2022-12-20 ENCOUNTER — Other Ambulatory Visit: Payer: Self-pay | Admitting: Acute Care

## 2022-12-20 DIAGNOSIS — Z122 Encounter for screening for malignant neoplasm of respiratory organs: Secondary | ICD-10-CM

## 2022-12-20 DIAGNOSIS — Z87891 Personal history of nicotine dependence: Secondary | ICD-10-CM

## 2022-12-21 ENCOUNTER — Encounter: Payer: Self-pay | Admitting: Acute Care

## 2022-12-27 ENCOUNTER — Telehealth: Payer: Self-pay | Admitting: Acute Care

## 2022-12-27 NOTE — Telephone Encounter (Signed)
Patient is currently schedule for a CT scan but needs to reschedule appointment. Patient is off today and needs for it to be sorted. Her insurance also denied it and needs to speak with someone about that as well. She can be reached on her home phone for today.

## 2022-12-27 NOTE — Telephone Encounter (Signed)
Returned call. Advised CT has been scheduled for 02/03/2023 which is after one year form previous scan. Advised scan will be sent for approval later this month, if there are any issues we will reach back out to patient. Patient verbalized understanding.

## 2023-01-26 ENCOUNTER — Other Ambulatory Visit: Payer: 59

## 2023-01-30 ENCOUNTER — Telehealth: Payer: Self-pay | Admitting: Acute Care

## 2023-01-30 NOTE — Telephone Encounter (Signed)
 Patient would like to reschedule CT scan. Patient states insurance company needs clinical information. Patient phone number is [phone].

## 2023-01-30 NOTE — Telephone Encounter (Signed)
 Returned call. In process.

## 2023-01-31 ENCOUNTER — Other Ambulatory Visit: Payer: 59

## 2023-01-31 NOTE — Telephone Encounter (Signed)
 Patient called to update staff on insurance of CT. Per patient she is being told that her CT can not be approved until after 02/06/2023 due to the previous scan being denied. Pt wanted to make sure our staff is aware.   Hermina, can you contact the patient once her CT has been approved?   Thank you Isaiah

## 2023-02-13 ENCOUNTER — Telehealth: Payer: Self-pay | Admitting: Physical Medicine and Rehabilitation

## 2023-02-13 NOTE — Telephone Encounter (Signed)
No need to bring in records; we have a CT abdomen and pelvis from 2021 the should suffice. Please set her up with Dr. Wynn Banker for L SI joint injection; time it for at least 3 months from her last injection with Dr. Regino Schultze. Thank you!

## 2023-02-13 NOTE — Telephone Encounter (Signed)
Patient called in and states she was getting sacral joint injections with Dr Regino Schultze at [facility] and her pain has gotten bad this past week and when she called their office they informed her that we also do injections here so she would like to schedule here if possible .States she usually gets them done twice a year or so and her last injection was Nov 2023. Patient said she brought her records back when she 1st came in and if she needs to bring in her records she can .

## 2023-02-14 NOTE — Telephone Encounter (Signed)
LVM, advised annual LCS Ct scheduled for 02/16/2023 at 1;20pm at GI.

## 2023-02-16 ENCOUNTER — Ambulatory Visit
Admission: RE | Admit: 2023-02-16 | Discharge: 2023-02-16 | Disposition: A | Discharge status: Home / Self Care | Payer: 59 | Source: Ambulatory Visit | Attending: Acute Care | Admitting: Acute Care

## 2023-02-16 DIAGNOSIS — Z87891 Personal history of nicotine dependence: Secondary | ICD-10-CM | POA: Diagnosis not present

## 2023-02-16 DIAGNOSIS — Z122 Encounter for screening for malignant neoplasm of respiratory organs: Secondary | ICD-10-CM

## 2023-02-20 ENCOUNTER — Other Ambulatory Visit: Payer: Self-pay

## 2023-02-20 ENCOUNTER — Telehealth: Payer: Self-pay | Admitting: Family Medicine

## 2023-02-20 DIAGNOSIS — Z122 Encounter for screening for malignant neoplasm of respiratory organs: Secondary | ICD-10-CM

## 2023-02-20 DIAGNOSIS — Z87891 Personal history of nicotine dependence: Secondary | ICD-10-CM

## 2023-02-20 NOTE — Telephone Encounter (Unsigned)
Copied from CRM [id number]. Topic: Clinical - Lab/Test Results >> Feb 20, 2023 11:59 AM Maxwell Marion wrote: Reason for CRM: Patient is wanting a call from one of the nurses to discuss her CT results and to discuss what she needs to do next

## 2023-02-21 ENCOUNTER — Telehealth: Payer: Self-pay

## 2023-02-21 NOTE — Telephone Encounter (Signed)
Contacted patient.   Patients concern is atherosclerosis of aorta and coronary arteries. [name] agrees to see patient for this. Appointment moved up to Feb 11th

## 2023-02-21 NOTE — Telephone Encounter (Signed)
Contacted patient.   Patients concern is atherosclerosis of aorta and coronary arteries. Jerrel Ivory agrees to see patient for this. Appointment moved up to Feb 11th

## 2023-02-21 NOTE — Telephone Encounter (Signed)
Copied from CRM [id number]. Topic: Clinical - Lab/Test Results >> Feb 21, 2023  1:35 PM Prudencio Pair wrote: Reason for CRM: Patient called stating that she was expecting a call back from Dr. Ellamae Sia or the nurse in regards to a scan she had done. Advised pt per the note left by Dr. Ellamae Sia to follow up with Kandice Robinsons, NP & if she does not feel they are addressing it, then to schedule a follow up with her & she stated, "No, she was told to follow up with her medical doctor as Maralyn Sago is just the one who ordered the scan. Offered to scheduled follow up appt with Dr. Ellamae Sia & pt declined. Pt is frustrated & wants a call back today. CB #: [phone].

## 2023-02-27 ENCOUNTER — Telehealth: Payer: Self-pay | Admitting: Physical Medicine and Rehabilitation

## 2023-02-27 NOTE — Telephone Encounter (Signed)
Patient is scheduled for L SI joint injection and states she usually gets bilateral injections and would like to ask if we can change it for her . She received letter from insurance that L side was approved so is aware we would need to get a new auth if okay to do both sides

## 2023-02-28 NOTE — Telephone Encounter (Signed)
 The last time I physically evaluated her, which was close to 10 months ago, only the left side had + SI joint maneuvers. Insurance will likely not pay for both unless I can evidence she has symptoms on both sides. That would require an office visit and exam; April, if I can squeeze her in tomorrow over lunch or at the end of clinic that works for me.

## 2023-03-03 NOTE — Telephone Encounter (Signed)
 Thank you :)

## 2023-03-03 NOTE — Telephone Encounter (Signed)
 I do, if she wants to get bilateral injections I need to do an exam that justifies both sides. You can squeeze her in an extra slot any time I am in clinic if she is free. TY!

## 2023-03-06 NOTE — Patient Instructions (Signed)
Our records indicate that you are due for your screening mammogram.  Please call the imaging center that does your yearly mammograms to make an appointment for a mammogram at your earliest convenience. Our office also has a mobile unit through the [facility] that comes to our location. Please call our office if you would like to make an appointment.

## 2023-03-07 ENCOUNTER — Encounter: Payer: Self-pay | Admitting: Family Medicine

## 2023-03-07 ENCOUNTER — Ambulatory Visit (INDEPENDENT_AMBULATORY_CARE_PROVIDER_SITE_OTHER): Payer: 59 | Admitting: Family Medicine

## 2023-03-07 VITALS — BP 116/76 | HR 68 | Temp 97.3°F | Ht 64.0 in | Wt 140.4 lb

## 2023-03-07 DIAGNOSIS — F339 Major depressive disorder, recurrent, unspecified: Secondary | ICD-10-CM | POA: Diagnosis not present

## 2023-03-07 DIAGNOSIS — R413 Other amnesia: Secondary | ICD-10-CM | POA: Diagnosis not present

## 2023-03-07 DIAGNOSIS — E559 Vitamin D deficiency, unspecified: Secondary | ICD-10-CM | POA: Diagnosis not present

## 2023-03-07 DIAGNOSIS — F5104 Psychophysiologic insomnia: Secondary | ICD-10-CM | POA: Diagnosis not present

## 2023-03-07 DIAGNOSIS — F419 Anxiety disorder, unspecified: Secondary | ICD-10-CM

## 2023-03-07 DIAGNOSIS — G629 Polyneuropathy, unspecified: Secondary | ICD-10-CM | POA: Diagnosis not present

## 2023-03-07 DIAGNOSIS — K219 Gastro-esophageal reflux disease without esophagitis: Secondary | ICD-10-CM

## 2023-03-07 DIAGNOSIS — I7090 Unspecified atherosclerosis: Secondary | ICD-10-CM | POA: Diagnosis not present

## 2023-03-07 DIAGNOSIS — J410 Simple chronic bronchitis: Secondary | ICD-10-CM | POA: Diagnosis not present

## 2023-03-07 DIAGNOSIS — G894 Chronic pain syndrome: Secondary | ICD-10-CM

## 2023-03-07 DIAGNOSIS — I10 Essential (primary) hypertension: Secondary | ICD-10-CM

## 2023-03-07 MED ORDER — TRELEGY ELLIPTA 100-62.5-25 MCG/ACT IN AEPB: 1.0000 | INHALATION_SPRAY | Freq: Every day | RESPIRATORY_TRACT | 11 refills | Status: DC

## 2023-03-07 NOTE — Progress Notes (Signed)
Subjective:  Patient ID: [name], female    DOB: 01-20-1961, 63 y.o.   MRN: [id number]  Patient Care Team: Arrie Senate, FNP as PCP - General (Family Medicine) Ubaldo Glassing, Lebron Conners, MD (Hematology and Oncology) Hart Robinsons, DO Rourk, Gerrit Friends, MD as Consulting Physician (Gastroenterology)   Chief Complaint:  atherosclerosis   HPI: [name] is a 63 y.o. female presenting on 03/07/2023 for atherosclerosis  HPI Atherosclerosis (Primary) States that when she received the results of her CT scan, she was thankful for pulmonary report but concerned for atherosclerosis. States that older sister had HTN, aortic dissection, and CVA at her age now and she wants to know how to prevent cardiac event.   Primary hypertension Has BP monitor at home Yes BP at home average - has not been checking daily, only checking when she feels that it is high. Notes that it goes up if she is stressed. States that this is typically when she is at work, 2x per week.  ROS Denies fatigue, peripheral edema, changes to vision, chest pain, headaches, palpitations, sweats, PND, orthopnea Endorses anxiety, SOB  Meds losartan and hydrochlorothiazide   CAD risks hypertension, hypercholesterolemia/hyperlipidemia, fam hx   Simple chronic bronchitis (HCC) States that she is SOB on occasion. Reports that she has stairs in her house and sometimes she will be out of breath with stairs. Identifies colder weather as a trigger as well. States that albuterol made her shaky. She does not have any inhalers currently. Is established with pulmonology, but she has not followed up.   Gastroesophageal reflux disease without esophagitis Current medications - none - using mylanta as needed.  Adverse side effects - none Sore throat - none Voice change - none Hemoptysis - none Dysphagia or dyspepsia - none Water brash - none Red Flags (weight loss, hematochezia, melena, weight loss, early satiety,  fevers, odynophagia, or persistent vomiting) - none  Anxiety/Depression, recurrent (HCC) States that anxiety is doing well. States that she is doing well on lexapro. States that she tried to cut it in half at the end of last year. States that it did not work for her so she continued with whole tablet. States that it helps her BP as well.   Chronic pain syndrome She is established with pain management. She is taking hydrocodone. She is currently taking a half a tablet. Uses it sparingly, 4-5 times per week, usually while working. She is hoping to start injection therapy. Has follow up in a few weeks.   Psychophysiological insomnia States that hydroxyzine was making her too sleepy during the day. Taking melatonin. States that when she knows she has to get up in the morning, it makes her anxious and so she won't sleep as well. She is trying magnesium and melatonin.   Vitamin D deficiency, follow-up  Lab Results  Component Value Date   VD25OH 39.9 06/24/2022   VD25OH 25.1 (L) 04/04/2017   VD25OH 22.4 (L) 02/17/2015   CALCIUM 9.3 09/27/2022   CALCIUM 9.4 06/24/2022   Wt Readings from Last 3 Encounters:  03/07/23 140 lb 6.4 oz (63.7 kg)  12/06/22 140 lb (63.5 kg)  10/25/22 137 lb 12.8 oz (62.5 kg)   She was last seen for vitamin D deficiency 6 months ago.  Management since that visit includes none. She is not having side effects.   Symptoms: No change in energy level No numbness or tingling  No bone pain No unexplained fracture   --------------------------------------------------------------------------------------------------- Memory Changes  States that she has trouble finding the names of things. States that she can remember events, but struggles to find the names of things.   Relevant past medical, surgical, family, and social history reviewed and updated as indicated.  Allergies and medications reviewed and updated. Data reviewed: Chart in Epic.   Past Medical History:   Diagnosis Date   Anxiety    Arthritis    Constipation 02/17/2015   Depression, recurrent (HCC) 07/24/2020   Emphysema lung (HCC)    Fibromyalgia    GERD (gastroesophageal reflux disease)    Hypertension    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   Menopausal vaginal dryness 02/17/2015    Past Surgical History:  Procedure Laterality Date   CHOLECYSTECTOMY N/A 04/01/2016   Procedure: LAPAROSCOPIC CHOLECYSTECTOMY;  Surgeon: Franky Macho, MD;  Location: [facility];  Service: General;  Laterality: N/A;   COLONOSCOPY  11/2010   normal colonoscopy from anal verge to ileocecal valve; no polyps, masses, AVMs, diverticula, or evidence of inflammation   ESOPHAGOGASTRODUODENOSCOPY N/A 10/16/2013   ZOX:WRUEAVWU ring s/p dilation/2 cm HH. multiple 3 mm round and shallow erosions. 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 Hiatal hernia   LAPAROSCOPIC APPENDECTOMY N/A 06/21/2013   Procedure: APPENDECTOMY LAPAROSCOPIC;  Surgeon: Dalia Heading, MD;  Location: [facility];  Service: General;  Laterality: N/A;   MALONEY DILATION N/A 10/16/2013   Procedure: Alvy Beal;  Surgeon: Corbin Ade, MD;  Location: [facility];  Service: Endoscopy;  Laterality: N/ATod [place] PLACEMENT  [id number]   Rt   IJ  Port-A-Cath  [email or web address]   STERNAL WIRES REMOVAL  01/23/2012   Procedure: STERNAL WIRES REMOVAL;  Surgeon: Delight Ovens, MD;  Location: [facility];  Service: Thoracic;  Laterality: N/A;   THORACOTOMY  [id number]   transsmanubrial anterolaterl thoracotomy with chest wall resection includinf the first rib and left  upper lobectomy for Pancoast turmor   TUBAL LIGATION      Social History   Socioeconomic History   Marital status: Married    Spouse name: Not on file   Number of children: Not on file   Years of education: Not on file   Highest education level: 12th grade  Occupational History   Not on file  Tobacco Use    Smoking status: Former    Current packs/day: 0.00    Average packs/day: 1.5 packs/day for 35.0 years (52.5 ttl pk-yrs)    Types: Cigarettes    Start date: 02/05/1975    Quit date: 02/04/2010    Years since quitting: 13.0   Smokeless tobacco: Never   Tobacco comments:    Quit x 8 years  Vaping Use   Vaping status: Never Used  Substance and Sexual Activity   Alcohol use: Yes    Alcohol/week: 0.0 standard drinks of alcohol    Comment: occasionally    Drug use: No   Sexual activity: Yes    Birth control/protection: Post-menopausal  Other Topics Concern   Not on file  Social History Narrative   Not on file   Social Drivers of Health   Financial Resource Strain: Low Risk  (03/06/2023)   Overall Financial Resource Strain (CARDIA)    Difficulty of Paying Living Expenses: Not hard at all  Food Insecurity: No Food Insecurity (03/06/2023)   Hunger Vital Sign    Worried About Running Out of Food in the Last Year: Never true    Ran Out  of Food in the Last Year: Never true  Transportation Needs: No Transportation Needs (03/06/2023)   PRAPARE - Administrator, Civil Service (Medical): No    Lack of Transportation (Non-Medical): No  Physical Activity: Unknown (03/06/2023)   Exercise Vital Sign    Days of Exercise per Week: Patient declined    Minutes of Exercise per Session: Not on file  Stress: Stress Concern Present (03/06/2023)   [employer] of Occupational Health - Occupational Stress Questionnaire    Feeling of Stress : To some extent  Social Connections: Socially Integrated (03/06/2023)   Social Connection and Isolation Panel [NHANES]    Frequency of Communication with Friends and Family: More than three times a week    Frequency of Social Gatherings with Friends and Family: More than three times a week    Attends Religious Services: More than 4 times per year    Active Member of [employer] or Organizations: Yes    Attends Engineer, structural: More than 4 times  per year    Marital Status: Married  Catering manager Violence: Unknown (04/29/2021)   Received from [employer], [facility]   HITS    Physically Hurt: Not on file    Insult or Talk Down To: Not on file    Threaten Physical Harm: Not on file    Scream or Curse: Not on file    Outpatient Encounter Medications as of 03/07/2023  Medication Sig   escitalopram (LEXAPRO) 20 MG tablet TAKE ONE TABLET DAILY   hydrochlorothiazide (HYDRODIURIL) 25 MG tablet TAKE ONE TABLET ONCE DAILY   HYDROcodone-acetaminophen (NORCO) 5-325 MG tablet Take 0.5-1 tablets by mouth daily as needed for severe pain.   losartan (COZAAR) 100 MG tablet TAKE ONE TABLET BY MOUTH DAILY   [DISCONTINUED] hydrOXYzine (VISTARIL) 25 MG capsule Take 1 capsule (25 mg total) by mouth every 8 (eight) hours as needed. (Patient not taking: Reported on 12/06/2022)   [DISCONTINUED] triamcinolone (KENALOG) 0.025 % cream Apply topically 2 (two) times daily.   No facility-administered encounter medications on file as of 03/07/2023.    Allergies  Allergen Reactions   Lisinopril Swelling    Of lips Of lips   Nitrofuran Derivatives     Review of Systems As per HPI  Objective:  BP 116/76   Pulse 68   Temp (!) 97.3 F (36.3 C)   Ht 5\' 4"  (1.626 m)   Wt 140 lb 6.4 oz (63.7 kg)   SpO2 98%   BMI 24.10 kg/m    Wt Readings from Last 3 Encounters:  03/07/23 140 lb 6.4 oz (63.7 kg)  12/06/22 140 lb (63.5 kg)  10/25/22 137 lb 12.8 oz (62.5 kg)   Physical Exam Constitutional:      General: She is awake. She is not in acute distress.    Appearance: Normal appearance. She is well-developed and well-groomed. She is not ill-appearing, toxic-appearing or diaphoretic.  Cardiovascular:     Rate and Rhythm: Normal rate and regular rhythm.     Pulses: Normal pulses.          Radial pulses are 2+ on the right side and 2+ on the left side.       Posterior tibial pulses are 2+ on the right side and 2+ on the left side.     Heart sounds:  Normal heart sounds. No murmur heard.    No gallop.  Pulmonary:     Effort: Pulmonary effort is normal. No respiratory distress.  Breath sounds: Normal breath sounds. Transmitted upper airway sounds present. No stridor. No wheezing, rhonchi or rales.     Comments: Hyperresonance of left lung fields  Musculoskeletal:     Cervical back: Full passive range of motion without pain and neck supple.     Right lower leg: No edema.     Left lower leg: No edema.  Skin:    General: Skin is warm.     Capillary Refill: Capillary refill takes less than 2 seconds.  Neurological:     General: No focal deficit present.     Mental Status: She is alert, oriented to person, place, and time and easily aroused. Mental status is at baseline.     GCS: GCS eye subscore is 4. GCS verbal subscore is 5. GCS motor subscore is 6.     Motor: No weakness.  Psychiatric:        Attention and Perception: Attention and perception normal.        Mood and Affect: Mood and affect normal.        Speech: Speech normal.        Behavior: Behavior normal. Behavior is cooperative.        Thought Content: Thought content normal. Thought content does not include homicidal or suicidal ideation. Thought content does not include homicidal or suicidal plan.        Cognition and Memory: Cognition and memory normal.        Judgment: Judgment normal.     Results for orders placed or performed in visit on 12/06/22  Tox[employer]ure Select,+Antidepr,UR   Collection Time: 12/06/22 10:24 AM  Result Value Ref Range   Summary Note       03/07/2023   10:16 AM 12/06/2022   10:34 AM 10/25/2022   10:06 AM 09/27/2022   10:23 AM 06/24/2022   11:14 AM  Depression screen PHQ 2/9  Decreased Interest 1 0 0 0 1  Down, Depressed, Hopeless 0  0 0 1  PHQ - 2 Score 1 0 0 0 2  Altered sleeping 1  0 0 0  Tired, decreased energy 1  1 1 1   Change in appetite 0  0 0 1  Feeling bad or failure about yourself  0  0 0 1  Trouble concentrating 0  0 0 1   Moving slowly or fidgety/restless 1  0 0 0  Suicidal thoughts 0  0 0 0  PHQ-9 Score 4  1 1 6   Difficult doing work/chores Not difficult at all  Somewhat difficult Not difficult at all Not difficult at all       03/07/2023   10:16 AM 10/25/2022   10:06 AM 09/27/2022   10:24 AM 06/24/2022   11:14 AM  GAD 7 : Generalized Anxiety Score  Nervous, Anxious, on Edge 1 0 1 1  Control/stop worrying 1 0 0 0  Worry too much - different things 1  0 0  Trouble relaxing 1 1 1  0  Restless 1 1 0 0  Easily annoyed or irritable 2 1 1 1   Afraid - awful might happen 0 0 0 0  Total GAD 7 Score 7  3 2   Anxiety Difficulty Somewhat difficult Not difficult at all Not difficult at all Not difficult at all   Pertinent labs & imaging results that were available during my care of the patient were reviewed by me and considered in my medical decision making.  Assessment & Plan:  Atlas was seen today for atherosclerosis.  Diagnoses  and all orders for this visit: 1. Atherosclerosis (Primary) Labs as below. Will communicate results to patient once available. Will await results to determine next steps.  Not fasting.  Patient declined starting statin at this time. Would like to try RYR. Reviewed prior ASCVD risk score with patient.  - Lipid panel  2. Primary hypertension Well controlled. Continue current regimen. Labs as below. Will communicate results to patient once available. Will await results to determine next steps.  - CBC with Differential/Platelet - CMP14+EGFR  3. Simple chronic bronchitis (HCC) Will start medication as below. Reviewed CT scan results as below.  - Fluticasone-Umeclidin-Vilant (TRELEGY ELLIPTA) 100-62.5-25 MCG/ACT AEPB; Inhale 1 puff into the lungs daily.  Dispense: 1 each; Refill: 11 CT CHEST LUNG CA SCREEN LOW DOSE W/O CM  IMPRESSION: 1. Lung-RADS 1S, negative. Continue annual screening with low-dose chest CT without contrast in 12 months. 2. The "S" modifier above refers to  potentially clinically significant non lung cancer related findings. Specifically, there is aortic atherosclerosis, in addition to right coronary artery disease. Please note that although the presence of coronary artery calcium documents the presence of coronary artery disease, the severity of this disease and any potential stenosis cannot be assessed on this non-gated CT examination. Assessment for potential risk factor modification, dietary therapy or pharmacologic therapy may be warranted, if clinically indicated. 3. Mild diffuse bronchial wall thickening with mild to moderate centrilobular and paraseptal emphysema; imaging findings suggestive of underlying COPD.   Aortic Atherosclerosis (ICD10-I70.0) and Emphysema (ICD10-J43.9).  Electronically Signed   By: Trudie Reed M.D.   On: 02/20/2023 08:02  4. Depression, recurrent (HCC) Well controlled. Continue current regimen.  Denies SI. Safety contract established.   5. Anxiety As above.   6. Gastroesophageal reflux disease without esophagitis Well controlled. Continue current regimen.   7. Psychophysiological insomnia Currently taking medication sparingly. Discussed with patient use of alcohol at night. Patient does not wish to change plan at this time.   8. Neuropathy Patient is established with pain management and has follow up soon.   9. Chronic pain syndrome As above.   10. Vitamin D deficiency Labs as below. Will communicate results to patient once available. Will await results to determine next steps.  - VITAMIN D 25 Hydroxy (Vit-D Deficiency, Fractures)  11. Memory change Discussed with patient that can be related to lack of sleep, poor diet, stress. Encouraged her to continue OTC supplements and work on lifestyle changes. Will complete MMSE at follow up.     Continue all other maintenance medications.  Follow up plan: Return in about 3 months (around 06/04/2023) for Chronic Condition Follow  up.   Continue healthy lifestyle choices, including diet (rich in fruits, vegetables, and lean proteins, and low in salt and simple carbohydrates) and exercise (at least 30 minutes of moderate physical activity daily).  Written and verbal instructions provided   The above assessment and management plan was discussed with the patient. The patient verbalized understanding of and has agreed to the management plan. Patient is aware to call the clinic if they develop any new symptoms or if symptoms persist or worsen. Patient is aware when to return to the clinic for a follow-up visit. Patient educated on when it is appropriate to go to the emergency department.   Neale Burly, DNP-FNP [facility] [address] [place], [place] [address] [phone]

## 2023-03-08 LAB — CBC WITH DIFFERENTIAL/PLATELET
Basophils Absolute: 0.1 10*3/uL (ref 0.0–0.2)
Basos: 2 %
EOS (ABSOLUTE): 0.5 10*3/uL — ABNORMAL HIGH (ref 0.0–0.4)
Eos: 10 %
Hematocrit: 40.1 % (ref 34.0–46.6)
Hemoglobin: 13.2 g/dL (ref 11.1–15.9)
Immature Grans (Abs): 0 10*3/uL (ref 0.0–0.1)
Immature Granulocytes: 0 %
Lymphocytes Absolute: 1.4 10*3/uL (ref 0.7–3.1)
Lymphs: 26 %
MCH: 28.5 pg (ref 26.6–33.0)
MCHC: 32.9 g/dL (ref 31.5–35.7)
MCV: 87 fL (ref 79–97)
Monocytes Absolute: 0.5 10*3/uL (ref 0.1–0.9)
Monocytes: 9 %
Neutrophils Absolute: 2.8 10*3/uL (ref 1.4–7.0)
Neutrophils: 53 %
Platelets: 293 10*3/uL (ref 150–450)
RBC: 4.63 x10E6/uL (ref 3.77–5.28)
RDW: 12.6 % (ref 11.7–15.4)
WBC: 5.3 10*3/uL (ref 3.4–10.8)

## 2023-03-08 LAB — LIPID PANEL
Chol/HDL Ratio: 2.8 {ratio} (ref 0.0–4.4)
Cholesterol, Total: 216 mg/dL — ABNORMAL HIGH (ref 100–199)
HDL: 78 mg/dL (ref >39)
LDL Chol Calc (NIH): 121 mg/dL — ABNORMAL HIGH (ref 0–99)
Triglycerides: 99 mg/dL (ref 0–149)
VLDL Cholesterol Cal: 17 mg/dL (ref 5–40)

## 2023-03-08 LAB — CMP14+EGFR
ALT: 57 [IU]/L — ABNORMAL HIGH (ref 0–32)
AST: 47 [IU]/L — ABNORMAL HIGH (ref 0–40)
Albumin: 4.1 g/dL (ref 3.9–4.9)
Alkaline Phosphatase: 227 [IU]/L — ABNORMAL HIGH (ref 44–121)
BUN/Creatinine Ratio: 30 — ABNORMAL HIGH (ref 12–28)
BUN: 28 mg/dL — ABNORMAL HIGH (ref 8–27)
Bilirubin Total: 0.5 mg/dL (ref 0.0–1.2)
CO2: 26 mmol/L (ref 20–29)
Calcium: 10 mg/dL (ref 8.7–10.3)
Chloride: 103 mmol/L (ref 96–106)
Creatinine, Ser: 0.94 mg/dL (ref 0.57–1.00)
Globulin, Total: 2.8 g/dL (ref 1.5–4.5)
Glucose: 97 mg/dL (ref 70–99)
Potassium: 3.8 mmol/L (ref 3.5–5.2)
Sodium: 143 mmol/L (ref 134–144)
Total Protein: 6.9 g/dL (ref 6.0–8.5)
eGFR: 68 mL/min/{1.73_m2} (ref >59)

## 2023-03-08 LAB — VITAMIN D 25 HYDROXY (VIT D DEFICIENCY, FRACTURES): Vit D, 25-Hydroxy: 45.8 ng/mL (ref 30.0–100.0)

## 2023-03-09 ENCOUNTER — Encounter: Payer: Self-pay | Admitting: Family Medicine

## 2023-03-09 NOTE — Progress Notes (Signed)
Stable, slight variation in EOS. Mild variations in BUN and BUN/creatinine ratio. Will continue to monitor, recommend avoiding nephrotoxic medications such as ibuprofen, maintain adequate hydration of 800-100 oz of water daily. Liver enzymes consistent with fatty liver. Recommend diet and exercise. We will work to improve blood pressure control, weight, and cholesterol. Recommend avoiding liver toxins such as alcohol and medications eliminated by the liver like tylenol. Notify the office if there are worsening symptoms of liver cirrhosis such as blood in your bowel movements or vomit, symptoms of infection, belly pain, swollen legs or ankles, trouble breathing, extreme tiredness, confusion, yellowing of the skin or whites of your eyes, called jaundice. Cholesterol is elevated. Diet encouraged - increase intake of fresh fruits and vegetables, increase intake of lean proteins. Bake, broil, or grill foods. Avoid fried, greasy, and fatty foods. Avoid fast foods. Increase intake of fiber-rich whole grains. Exercise encouraged - at least 150 minutes per week and advance as tolerated. Based on ASCVD risk score, do not have to start a statin at this time. Can try red yeast rice and fish oil, we will recheck in 3-6 months. The 10-year ASCVD risk score (Arnett DK, et al., 2019) is: 4.4%

## 2023-03-14 ENCOUNTER — Encounter: Payer: 59 | Admitting: Registered Nurse

## 2023-03-21 NOTE — Progress Notes (Unsigned)
 Subjective:    Patient ID: [name], female    DOB: January 20, 1961, 63 y.o.   MRN: [id number]  HPI  [name] is a 63 y.o. year old female  who  has a past medical history of Anxiety, Arthritis, Constipation (02/17/2015), Depression, recurrent (HCC) (07/24/2020), Emphysema lung (HCC), Fibromyalgia, GERD (gastroesophageal reflux disease), Hypertension, Lung cancer (HCC) (2012), and Menopausal vaginal dryness (02/17/2015).   They are presenting to PM&R clinic for follow up related to bilateral low back pain .  Plan from last visit: Chronic Bilateral Low Back Pain without Sciatica: Continue HEP as Tolerated. Continue to Monitor.  Chronic Left Shoulder Pain: She has completed Physical Therapy. Continue HEP as Tolerated. Continue to Monitor.  Chronic Pain Syndrome: Continue Hydrocodone 5mg  /325 0.5- 1 tablet daily as needed for pain. We will continue the opioid monitoring program, this consists of regular clinic visits, examinations, urine drug screen, pill counts as well as use of [place] Controlled Substance Reporting system. A 12 month History has been reviewed on the [place] Controlled Substance Reporting System on 12/06/2022     F/U in 3 months    Interval Hx:  - Therapies: Went to PT over the summer, with benefit. Does a HEP intermittently. Went to [facility] in [place], is planning on going back after her joint injections but does not want a referral today.    - Follow ups: UDS last visit as expected 11/12.    - Falls: none   - QMV:HQIO   - Medications: Norco last 11/10/22 #30 tabs filled Continues to pick up prescribed THC for her husband, denies personal use. Was not refilled. She only uses 1/2 tab 1-2x per day "because it makes me feel loopy". Tolerates the half tabs.   She has failed tramadol due to causing tiredness and not improving her pain in the past, and gabapentin caused tiredness as well and her nueropathy improved once she was done with  chemotherapy.    - Other concerns:  was getting sacral joint injections with Dr Regino Schultze at [facility],  last injection was Nov 2023.   - She says these injections have been done bilaterally in the past without issue, and last 3+ months. Is intere  Pain Inventory Average Pain 7 Pain Right Now 5 My pain is intermittent, constant, sharp, burning, dull, stabbing, tingling, and aching  In the last 24 hours, has pain interfered with the following? General activity 0 Relation with others 0 Enjoyment of life 7 What TIME of day is your pain at its worst? morning  and varies Sleep (in general) Fair  Pain is worse with: bending and sitting Pain improves with: rest, pacing activities, medication, and ice, injections Relief from Meds: 10  Family History  Problem Relation Age of Onset   Cancer Mother        lung   Hypertension Father    Stroke Father    Hypertension Sister    Heart disease Sister    Cancer Brother 42       pancreatic   Colon cancer Neg Hx    Breast cancer Neg Hx    Social History   Socioeconomic History   Marital status: Married    Spouse name: Not on file   Number of children: Not on file   Years of education: Not on file   Highest education level: 12th grade  Occupational History   Not on file  Tobacco Use   Smoking status: Former    Current  packs/day: 0.00    Average packs/day: 1.5 packs/day for 35.0 years (52.5 ttl pk-yrs)    Types: Cigarettes    Start date: 02/05/1975    Quit date: 02/04/2010    Years since quitting: 13.1   Smokeless tobacco: Never   Tobacco comments:    Quit x 8 years  Vaping Use   Vaping status: Never Used  Substance and Sexual Activity   Alcohol use: Yes    Alcohol/week: 0.0 standard drinks of alcohol    Comment: occasionally    Drug use: No   Sexual activity: Yes    Birth control/protection: Post-menopausal  Other Topics Concern   Not on file  Social History Narrative   Not on file   Social Drivers of Health    Financial Resource Strain: Low Risk  (03/06/2023)   Overall Financial Resource Strain (CARDIA)    Difficulty of Paying Living Expenses: Not hard at all  Food Insecurity: No Food Insecurity (03/06/2023)   Hunger Vital Sign    Worried About Running Out of Food in the Last Year: Never true    Ran Out of Food in the Last Year: Never true  Transportation Needs: No Transportation Needs (03/06/2023)   PRAPARE - Administrator, Civil Service (Medical): No    Lack of Transportation (Non-Medical): No  Physical Activity: Unknown (03/06/2023)   Exercise Vital Sign    Days of Exercise per Week: Patient declined    Minutes of Exercise per Session: Not on file  Stress: Stress Concern Present (03/06/2023)   [employer] of Occupational Health - Occupational Stress Questionnaire    Feeling of Stress : To some extent  Social Connections: Socially Integrated (03/06/2023)   Social Connection and Isolation Panel [NHANES]    Frequency of Communication with Friends and Family: More than three times a week    Frequency of Social Gatherings with Friends and Family: More than three times a week    Attends Religious Services: More than 4 times per year    Active Member of Clubs or Organizations: Yes    Attends Banker Meetings: More than 4 times per year    Marital Status: Married   Past Surgical History:  Procedure Laterality Date   CHOLECYSTECTOMY N/A 04/01/2016   Procedure: LAPAROSCOPIC CHOLECYSTECTOMY;  Surgeon: Franky Macho, MD;  Location: [facility];  Service: General;  Laterality: N/A;   COLONOSCOPY  11/2010   normal colonoscopy from anal verge to ileocecal valve; no polyps, masses, AVMs, diverticula, or evidence of inflammation   ESOPHAGOGASTRODUODENOSCOPY N/A 10/16/2013   IHK:VQQVZDGL ring s/p dilation/2 cm HH. multiple 3 mm round and shallow erosions. 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  Hiatal hernia   LAPAROSCOPIC APPENDECTOMY N/A 06/21/2013   Procedure: APPENDECTOMY LAPAROSCOPIC;  Surgeon: Dalia Heading, MD;  Location: [facility];  Service: General;  Laterality: N/A;   MALONEY DILATION N/A 10/16/2013   Procedure: Alvy Beal;  Surgeon: Corbin Ade, MD;  Location: [facility];  Service: Endoscopy;  Laterality: N/ATod [place] PLACEMENT  [id number]   Rt   IJ  Port-A-Cath  dr.burney   STERNAL WIRES REMOVAL  01/23/2012   Procedure: STERNAL WIRES REMOVAL;  Surgeon: Delight Ovens, MD;  Location[facility] OR;  Service: Thoracic;  Laterality: N/A;   THORACOTOMY  [id number]   transsmanubrial anterolaterl thoracotomy with chest wall resection includinf the first rib and left  upper lobectomy for Pancoast turmor   TUBAL  LIGATION     Past Surgical History:  Procedure Laterality Date   CHOLECYSTECTOMY N/A 04/01/2016   Procedure: LAPAROSCOPIC CHOLECYSTECTOMY;  Surgeon: Franky Macho, MD;  Location: [facility];  Service: General;  Laterality: N/A;   COLONOSCOPY  11/2010   normal colonoscopy from anal verge to ileocecal valve; no polyps, masses, AVMs, diverticula, or evidence of inflammation   ESOPHAGOGASTRODUODENOSCOPY N/A 10/16/2013   NWG:NFAOZHYQ ring s/p dilation/2 cm HH. multiple 3 mm round and shallow erosions. 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 Hiatal hernia   LAPAROSCOPIC APPENDECTOMY N/A 06/21/2013   Procedure: APPENDECTOMY LAPAROSCOPIC;  Surgeon: Dalia Heading, MD;  Location: [facility];  Service: General;  Laterality: N/A;   MALONEY DILATION N/A 10/16/2013   Procedure: Alvy Beal;  Surgeon: Corbin Ade, MD;  Location: [facility];  Service: Endoscopy;  Laterality: N/ATod [place] PLACEMENT  [id number]   Rt   IJ  Port-A-Cath  dr.burney   STERNAL WIRES REMOVAL  01/23/2012   Procedure: STERNAL WIRES REMOVAL;  Surgeon: Delight Ovens, MD;  Location: [facility] OR;  Service: Thoracic;   Laterality: N/A;   THORACOTOMY  [id number]   transsmanubrial anterolaterl thoracotomy with chest wall resection includinf the first rib and left  upper lobectomy for Pancoast turmor   TUBAL LIGATION     Past Medical History:  Diagnosis Date   Anxiety    Arthritis    Constipation 02/17/2015   Depression, recurrent (HCC) 07/24/2020   Emphysema lung (HCC)    Fibromyalgia    GERD (gastroesophageal reflux disease)    Hypertension    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   Menopausal vaginal dryness 02/17/2015   BP 111/75   Pulse 91   Ht 5\' 4"  (1.626 m)   Wt 139 lb (63 kg)   SpO2 95%   BMI 23.86 kg/m   Opioid Risk Score:   Fall Risk Score:  `1  Depression screen [facility] 2/9     03/22/2023   10:05 AM 03/07/2023   10:16 AM 12/06/2022   10:34 AM 10/25/2022   10:06 AM 09/27/2022   10:23 AM 06/24/2022   11:14 AM 05/12/2022    4:01 PM  Depression screen PHQ 2/9  Decreased Interest 0 1 0 0 0 1 0  Down, Depressed, Hopeless 0 0  0 0 1 0  PHQ - 2 Score 0 1 0 0 0 2 0  Altered sleeping  1  0 0 0 0  Tired, decreased energy  1  1 1 1  0  Change in appetite  0  0 0 1 0  Feeling bad or failure about yourself   0  0 0 1 0  Trouble concentrating  0  0 0 1 0  Moving slowly or fidgety/restless  1  0 0 0 0  Suicidal thoughts  0  0 0 0 0  PHQ-9 Score  4  1 1 6  0  Difficult doing work/chores  Not difficult at all  Somewhat difficult Not difficult at all Not difficult at all Not difficult at all    Review of Systems  Musculoskeletal:  Positive for back pain.       Left shoulder pain, buttocks pain  All other systems reviewed and are negative.      Objective:   Physical Exam   Constitutional: No apparent distress. Appropriate appearance for age.  HENT: No JVD. Neck Supple. Trachea midline. Atraumatic, normocephalic. Eyes: PERRLA. EOMI.  Visual fields grossly intact.  Cardiovascular: RRR, no murmurs/rub/gallops. No Edema. Peripheral pulses 2+  Respiratory: CTAB. No rales, rhonchi, or wheezing.  On RA.  Skin: C/D/I. No apparent lesions.   MSK: Back Exam:   Inspection: Pelvis was  even.  Lumbar lordotic curvature was   wnl .  There was  no evidence of scoliosis.  Palpation: Palpatory exam revealed ttp at the Bilateral PSIS, SI joints>>> R ischial tuberosity, lumbar paraspinals . There was no evidence of spasm.   ROM revealed restricted ROM in hip extension . Special/provocative testing:    SLR: -   Slump test: -    Facet loading: + L5/S1(very non-specific)   TTP at paraspinals: Mild +(sensitive for facet pain...if no ttp then likely not facet pain)   Pearlean Brownie test: + Bilaterally, R>L   FAIR test: + Bilaterally   Gaenslen test: + Bilaterally   Yeoman's test: + L only   Thomas Test: + Bilaterally   Information in () parenthesis is normals/details of specific exam.        Assessment & Plan:   Circe CORYN MOSSO is a 64 y.o. year old female  who  has a past medical history of Anxiety, Arthritis, Constipation (02/17/2015), Depression, recurrent (HCC) (07/24/2020), Emphysema lung (HCC), Fibromyalgia, GERD (gastroesophageal reflux disease), Hypertension, Lung cancer (HCC) (2012), and Menopausal vaginal dryness (02/17/2015).   They are presenting to PM&R clinic as a follow up patient for treatment of low back and SI joint pain, bilaterally .    Chronic pain syndrome Encounter for long-term opiate analgesic use Encounter for therapeutic drug monitoring -     ToxAssure Select,+Antidepr,UR  Refilled norco for 1 month; will refill additional 2 months if UDS today as expected  Follow up with Riley Lam in 3 months  Has failed tramadol and gabapentin  Bilateral sacroiliitis (HCC) Chronic bilateral low back pain without sciatica  Will discuss bilateral SI joint injections with Dr. Doroteo Bradford in March, given bilateral + testing today and history of 3+ months pain relief with these injections.

## 2023-03-22 ENCOUNTER — Encounter: Payer: Self-pay | Admitting: Physical Medicine and Rehabilitation

## 2023-03-22 ENCOUNTER — Other Ambulatory Visit: Payer: Self-pay | Admitting: Family Medicine

## 2023-03-22 ENCOUNTER — Telehealth: Payer: Self-pay

## 2023-03-22 ENCOUNTER — Encounter: Payer: 59 | Attending: Registered Nurse | Admitting: Physical Medicine and Rehabilitation

## 2023-03-22 VITALS — BP 111/75 | HR 91 | Ht 64.0 in | Wt 139.0 lb

## 2023-03-22 DIAGNOSIS — G8929 Other chronic pain: Secondary | ICD-10-CM | POA: Insufficient documentation

## 2023-03-22 DIAGNOSIS — M545 Low back pain, unspecified: Secondary | ICD-10-CM | POA: Insufficient documentation

## 2023-03-22 DIAGNOSIS — F339 Major depressive disorder, recurrent, unspecified: Secondary | ICD-10-CM

## 2023-03-22 DIAGNOSIS — M461 Sacroiliitis, not elsewhere classified: Secondary | ICD-10-CM | POA: Insufficient documentation

## 2023-03-22 DIAGNOSIS — Z79891 Long term (current) use of opiate analgesic: Secondary | ICD-10-CM | POA: Insufficient documentation

## 2023-03-22 DIAGNOSIS — G894 Chronic pain syndrome: Secondary | ICD-10-CM | POA: Insufficient documentation

## 2023-03-22 DIAGNOSIS — Z5181 Encounter for therapeutic drug level monitoring: Secondary | ICD-10-CM | POA: Diagnosis not present

## 2023-03-22 DIAGNOSIS — I10 Essential (primary) hypertension: Secondary | ICD-10-CM

## 2023-03-22 MED ORDER — HYDROCODONE-ACETAMINOPHEN 5-325 MG PO TABS: 0.5000 | ORAL_TABLET | Freq: Every day | ORAL | 0 refills | Status: DC | PRN

## 2023-03-22 NOTE — Telephone Encounter (Addendum)
 Approved today by [facility] NCPDP 2017 Your PA request has been approved. Additional information will be provided in the approval communication. (Message 1145) Effective Date: 03/22/2023 Authorization Expiration Date: 09/18/2023

## 2023-03-22 NOTE — Telephone Encounter (Signed)
 PA For Hydrocodone 5/325 created

## 2023-03-24 LAB — TOXASSURE SELECT,+ANTIDEPR,UR

## 2023-03-27 ENCOUNTER — Encounter: Payer: Self-pay | Admitting: Physical Medicine and Rehabilitation

## 2023-03-28 ENCOUNTER — Ambulatory Visit: Payer: 59 | Admitting: Family Medicine

## 2023-03-29 NOTE — Progress Notes (Unsigned)
  PROCEDURE RECORD [facility]   Name: VALA RAFFO DOB:1960-03-11 MRN: [id number]  Date:03/29/2023  Physician: {CPRM-PROVIDERS:[id number]}    Nurse/CMA: ***  Allergies:  Allergies  Allergen Reactions   Lisinopril Swelling    Of lips Of lips   Nitrofuran Derivatives     Consent Signed: {yes no:314532}  Is patient diabetic? {yes no:314532}  CBG today? ***  Pregnant: {yes no:314532} LMP: No LMP recorded. Patient is postmenopausal. (age 64-55)  Anticoagulants: {Yes/No:19989} Anti-inflammatory: {Yes/No:19989} Antibiotics: {Yes/No:19989}  Procedure: ***  Position: {PRONE/SUPINE/SITTING/LATERAL:21022916} Start Time: ***  End Time: ***  Fluoro Time: ***  RN/CMA *** ***    Time *** ***    BP *** ***    Pulse *** ***    Respirations *** ***    O2 Sat *** ***    S/S *** ***    Pain Level *** ***     D/C home with ***, patient A & O X 3, D/C instructions reviewed, and sits independently.

## 2023-03-30 ENCOUNTER — Encounter: Payer: 59 | Attending: Registered Nurse | Admitting: Physical Medicine & Rehabilitation

## 2023-03-30 ENCOUNTER — Encounter: Payer: Self-pay | Admitting: Physical Medicine & Rehabilitation

## 2023-03-30 VITALS — BP 151/83 | HR 70 | Ht 64.0 in | Wt 140.8 lb

## 2023-03-30 DIAGNOSIS — M461 Sacroiliitis, not elsewhere classified: Secondary | ICD-10-CM | POA: Diagnosis not present

## 2023-03-30 MED ORDER — LIDOCAINE HCL (PF) 2 % IJ SOLN
2.0000 mL | Freq: Once | INTRAMUSCULAR | Status: AC
  Administered 2023-03-30: 2 mL

## 2023-03-30 MED ORDER — IOHEXOL 180 MG/ML  SOLN
2.0000 mL | Freq: Once | INTRAMUSCULAR | Status: AC
  Administered 2023-03-30: 2 mL

## 2023-03-30 MED ORDER — LIDOCAINE HCL 1 % IJ SOLN
10.0000 mL | Freq: Once | INTRAMUSCULAR | Status: AC
  Administered 2023-03-30: 10 mL

## 2023-03-30 MED ORDER — BETAMETHASONE SOD PHOS & ACET 6 (3-3) MG/ML IJ SUSP
6.0000 mg | Freq: Once | INTRAMUSCULAR | Status: AC
  Administered 2023-03-30: 6 mg

## 2023-03-30 NOTE — Progress Notes (Signed)
 Bilateral sacroiliac injections under fluoroscopic guidance  Indication: Low back and buttocks pain not relieved by medication management and other conservative care.  Informed consent was obtained after describing risks and benefits of the procedure with the patient, this includes bleeding, bruising, infection, paralysis and medication side effects. The patient wishes to proceed and has given written consent. The patient was placed in a prone position. The lumbar and sacral area was marked and prepped with Betadine. A 25-gauge 1-1/2 inch needle was inserted into the skin and subcutaneous tissue and 1 mL of 1% lidocaine was injected into each side. Then a 25-gauge 3 inch spinal needle was inserted under fluoroscopic guidance into the left sacroiliac joint. AP and lateral images were utilized. Omnipaque 180x0.5 mL under live fluoroscopy demonstrated no intravascular uptake. Then a solution containing one ML of 6 mg per mL Celestone in 2 ML of 2% lidocaine MPF was injected x1.5 mL. This same procedure was repeated on the right side using the same needle, injectate, and technique. Patient tolerated the procedure well. Post procedure instructions were given. Please see post procedure form.  Meds: Celestone 6mg  Omnipaque 1ml Lidocaine 1% 6ml Lidocaine 2% PF 2 ml

## 2023-04-18 ENCOUNTER — Ambulatory Visit (INDEPENDENT_AMBULATORY_CARE_PROVIDER_SITE_OTHER): Payer: 59 | Admitting: Adult Health

## 2023-04-18 ENCOUNTER — Encounter: Payer: Self-pay | Admitting: Adult Health

## 2023-04-18 ENCOUNTER — Other Ambulatory Visit (HOSPITAL_COMMUNITY)
Admission: RE | Admit: 2023-04-18 | Discharge: 2023-04-18 | Disposition: A | Discharge status: Home / Self Care | Source: Ambulatory Visit | Attending: Adult Health | Admitting: Adult Health

## 2023-04-18 VITALS — BP 138/88 | HR 56 | Ht 63.0 in | Wt 141.5 lb

## 2023-04-18 DIAGNOSIS — Z1331 Encounter for screening for depression: Secondary | ICD-10-CM

## 2023-04-18 DIAGNOSIS — Z01419 Encounter for gynecological examination (general) (routine) without abnormal findings: Secondary | ICD-10-CM | POA: Insufficient documentation

## 2023-04-18 DIAGNOSIS — N898 Other specified noninflammatory disorders of vagina: Secondary | ICD-10-CM | POA: Diagnosis not present

## 2023-04-18 DIAGNOSIS — N941 Unspecified dyspareunia: Secondary | ICD-10-CM | POA: Diagnosis not present

## 2023-04-18 DIAGNOSIS — R6882 Decreased libido: Secondary | ICD-10-CM

## 2023-04-18 DIAGNOSIS — Z1211 Encounter for screening for malignant neoplasm of colon: Secondary | ICD-10-CM | POA: Diagnosis not present

## 2023-04-18 LAB — HEMOCCULT GUIAC POC 1CARD (OFFICE): Fecal Occult Blood, POC: NEGATIVE

## 2023-04-18 MED ORDER — ESTRADIOL 0.1 MG/GM VA CREA: TOPICAL_CREAM | VAGINAL | 1 refills | Status: AC

## 2023-04-18 NOTE — Progress Notes (Signed)
 Patient ID: [name], female   DOB: 1960/08/07, 63 y.o.   MRN: [id number] History of Present Illness: [name] is a 63 year old white female, married, PM in for a well woman gyn exam and pap. She is complaining of vaginal dryness, no sex drive and pain with sex.  PCP is Neale Burly NP   Current Medications, Allergies, Past Medical History, Past Surgical History, Family History and Social History were reviewed in [employer] record.     Review of Systems: Patient denies any headaches, hearing loss, fatigue, blurred vision, shortness of breath, chest pain, abdominal pain, problems with bowel movements, urination. No joint pain or mood swings.  See HPI for positives Denies any vaginal bleeding    Physical Exam:BP 138/88 (BP Location: Right Arm, Patient Position: Sitting, Cuff Size: Normal)   Pulse (!) 56   Ht 5\' 3"  (1.6 m)   Wt 141 lb 8 oz (64.2 kg)   BMI 25.07 kg/m   General:  Well developed, well nourished, no acute distress Skin:  Warm and dry Neck:  Midline trachea, normal thyroid, good ROM, no lymphadenopathy, no carotid bruits heard Lungs; Clear to auscultation bilaterally Breast:  No dominant palpable mass, retraction, or nipple discharge Cardiovascular: Regular rate and rhythm Abdomen:  Soft, non tender, no hepatosplenomegaly Pelvic:  External genitalia is normal in appearance, no lesions.  The vagina is pale and dry, atrophic. Urethra has no lesions or masses. The cervix is smooth, has stenotic os, pap with HR HPV genotyping performed.  Uterus is felt to be normal size, shape, and contour.  No adnexal masses or tenderness noted.Bladder is non tender, no masses felt. Rectal: Good sphincter tone, no polyps, or hemorrhoids felt.  Hemoccult negative. Extremities/musculoskeletal:  No swelling or varicosities noted, no clubbing or cyanosis Psych:  No mood changes, alert and cooperative,seems happy AA is 2 Fall risk is low    04/18/2023   10:51  AM 03/30/2023    1:58 PM 03/22/2023   10:05 AM  Depression screen PHQ 2/9  Decreased Interest 1 0 0  Down, Depressed, Hopeless 0 0 0  PHQ - 2 Score 1 0 0  Altered sleeping 0    Tired, decreased energy 1    Change in appetite 0    Feeling bad or failure about yourself  0    Trouble concentrating 0    Moving slowly or fidgety/restless 0    Suicidal thoughts 0    PHQ-9 Score 2         04/18/2023   10:52 AM 03/07/2023   10:16 AM 10/25/2022   10:06 AM 09/27/2022   10:24 AM  GAD 7 : Generalized Anxiety Score  Nervous, Anxious, on Edge 1 1 0 1  Control/stop worrying 0 1 0 0  Worry too much - different things 0 1  0  Trouble relaxing 1 1 1 1   Restless 0 1 1 0  Easily annoyed or irritable 1 2 1 1   Afraid - awful might happen 0 0 0 0  Total GAD 7 Score 3 7  3   Anxiety Difficulty  Somewhat difficult Not difficult at all Not difficult at all    Upstream - 04/18/23 1059       Pregnancy Intention Screening   Does the patient want to become pregnant in the next year? N/A    Does the patient's partner want to become pregnant in the next year? N/A    Would the patient like to discuss contraceptive options today?  N/A      Contraception Wrap Up   Current Method No Method - Other Reason   PM   End Method No Method - Other Reason   PM   Contraception Counseling Provided No              Co exam with Marylynn Pearson NP student   Impression and plan: 1. Encounter for gynecological examination with Papanicolaou smear of cervix (Primary) Pap sent Pap in 3-5 years if negative  Physical in 1 year Mammogram was negative 10/13/21  Had negative cologuard about 2 years ago she says  - Cytology - PAP( [place])  2. Encounter for screening fecal occult blood testing Hemoccult was negative  - POCT occult blood stool  3. Vaginal dryness Will try estrace vaginal cream, pea sized amount, in vagina nightly for 2 weeks then 2-3 x weekly Meds ordered this encounter  Medications   estradiol  (ESTRACE VAGINAL) 0.1 MG/GM vaginal cream    Sig: Use pea sized amount in vagina nightly for 2 weeks then use 2-3 x weekly, use finger to insert    Dispense:  42.5 g    Refill:  1    Supervising Provider:   Despina Hidden, LUTHER H [[id number]]     4. Decreased sex drive Increase frequency of sex   5. Dyspareunia, female Use good lubricate with sex   Increase foreplay  Go slow and communicate with husband

## 2023-04-20 LAB — CYTOLOGY - PAP
Adequacy: ABSENT
Comment: NEGATIVE
Diagnosis: NEGATIVE
High risk HPV: NEGATIVE

## 2023-05-01 ENCOUNTER — Other Ambulatory Visit: Payer: Self-pay | Admitting: Family Medicine

## 2023-05-01 DIAGNOSIS — I10 Essential (primary) hypertension: Secondary | ICD-10-CM

## 2023-05-04 ENCOUNTER — Other Ambulatory Visit: Payer: Self-pay | Admitting: Physical Medicine and Rehabilitation

## 2023-05-26 ENCOUNTER — Ambulatory Visit: Payer: 59 | Admitting: Family Medicine

## 2023-06-14 NOTE — Progress Notes (Unsigned)
 Subjective:    Patient ID: [name], female    DOB: 07/16/1960, 63 y.o.   MRN: [id number]  HPI: [name] is a 63 y.o. female who returns for follow up appointment for chronic pain and medication refill. states *** pain is located in  ***. rates pain ***. current exercise regime is walking and performing stretching exercises.  Ms. Kolander Morphine  equivalent is *** MME.   Last UDS was Performed on 03/22/2023, is was inconsistent. See Dr Dorn Gaskins note.     Pain Inventory Average Pain 8 Pain Right Now 4 My pain is constant, burning, stabbing, tingling, and aching  In the last 24 hours, has pain interfered with the following? General activity 4 Relation with others 4 Enjoyment of life 4 What TIME of day is your pain at its worst? morning  and night Sleep (in general) Poor  Pain is worse with: bending, sitting, and some activites Pain improves with: rest, heat/ice, and medication Relief from Meds: 8  Family History  Problem Relation Age of Onset   Cancer Mother        lung   Hypertension Father    Stroke Father    Hypertension Sister    Heart disease Sister    Cancer Brother 29       pancreatic   Colon cancer Neg Hx    Breast cancer Neg Hx    Social History   Socioeconomic History   Marital status: Married    Spouse name: Not on file   Number of children: Not on file   Years of education: Not on file   Highest education level: 12th grade  Occupational History   Not on file  Tobacco Use   Smoking status: Former    Current packs/day: 0.00    Average packs/day: 1.5 packs/day for 35.0 years (52.5 ttl pk-yrs)    Types: Cigarettes    Start date: 02/05/1975    Quit date: 02/04/2010    Years since quitting: 13.3   Smokeless tobacco: Never   Tobacco comments:    Quit x 8 years  Vaping Use   Vaping status: Never Used  Substance and Sexual Activity   Alcohol use: Yes    Alcohol/week: 0.0 standard drinks of alcohol    Comment: occasionally    Drug use: No    Sexual activity: Yes    Birth control/protection: Post-menopausal  Other Topics Concern   Not on file  Social History Narrative   Not on file   Social Drivers of Health   Financial Resource Strain: Low Risk  (04/18/2023)   Overall Financial Resource Strain (CARDIA)    Difficulty of Paying Living Expenses: Not hard at all  Food Insecurity: No Food Insecurity (04/18/2023)   Hunger Vital Sign    Worried About Running Out of Food in the Last Year: Never true    Ran Out of Food in the Last Year: Never true  Transportation Needs: No Transportation Needs (04/18/2023)   PRAPARE - Administrator, Civil Service (Medical): No    Lack of Transportation (Non-Medical): No  Physical Activity: Insufficiently Active (04/18/2023)   Exercise Vital Sign    Days of Exercise per Week: 2 days    Minutes of Exercise per Session: 20 min  Stress: No Stress Concern Present (04/18/2023)   [employer] of Occupational Health - Occupational Stress Questionnaire    Feeling of Stress : Only a little  Recent Concern: Stress - Stress Concern Present (03/06/2023)   [place]  [employer] of Occupational [employer] - Occupational Stress Questionnaire    Feeling of Stress : To some extent  Social Connections: Socially Integrated (04/18/2023)   Social Connection and Isolation Panel [NHANES]    Frequency of Communication with Friends and Family: More than three times a week    Frequency of Social Gatherings with Friends and Family: More than three times a week    Attends Religious Services: More than 4 times per year    Active Member of Clubs or Organizations: Yes    Attends Banker Meetings: More than 4 times per year    Marital Status: Married   Past Surgical History:  Procedure Laterality Date   CHOLECYSTECTOMY N/A 04/01/2016   Procedure: LAPAROSCOPIC CHOLECYSTECTOMY;  Surgeon: Alanda Allegra, MD;  Location: [facility];  Service: General;  Laterality: N/A;   COLONOSCOPY  11/2010   normal colonoscopy  from anal verge to ileocecal valve; no polyps, masses, AVMs, diverticula, or evidence of inflammation   ESOPHAGOGASTRODUODENOSCOPY N/A 10/16/2013   ZOX:WRUEAVWU ring s/p dilation/2 cm HH. multiple 3 mm round and shallow erosions. 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 Hiatal hernia   LAPAROSCOPIC APPENDECTOMY N/A 06/21/2013   Procedure: APPENDECTOMY LAPAROSCOPIC;  Surgeon: Beau Bound, MD;  Location: [facility];  Service: General;  Laterality: N/A;   MALONEY DILATION N/A 10/16/2013   Procedure: Dorothyann Gather;  Surgeon: Suzette Espy, MD;  Location: [facility];  Service: Endoscopy;  Laterality: N/AFredricka Jenny PLACEMENT  [id number]   Rt   IJ  Port-A-Cath  dr.burney   STERNAL WIRES REMOVAL  01/23/2012   Procedure: STERNAL WIRES REMOVAL;  Surgeon: Norita Beauvais, MD;  Location: [facility];  Service: Thoracic;  Laterality: N/A;   THORACOTOMY  [id number]   transsmanubrial anterolaterl thoracotomy with chest wall resection includinf the first rib and left  upper lobectomy for Pancoast turmor   TUBAL LIGATION     Past Surgical History:  Procedure Laterality Date   CHOLECYSTECTOMY N/A 04/01/2016   Procedure: LAPAROSCOPIC CHOLECYSTECTOMY;  Surgeon: Alanda Allegra, MD;  Location: [facility];  Service: General;  Laterality: N/A;   COLONOSCOPY  11/2010   normal colonoscopy from anal verge to ileocecal valve; no polyps, masses, AVMs, diverticula, or evidence of inflammation   ESOPHAGOGASTRODUODENOSCOPY N/A 10/16/2013   YQM:VHQIONGE ring s/p dilation/2 cm HH. multiple 3 mm round and shallow erosions. 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 Hiatal hernia   LAPAROSCOPIC APPENDECTOMY N/A 06/21/2013   Procedure: APPENDECTOMY LAPAROSCOPIC;  Surgeon: Beau Bound, MD;  Location: [facility];  Service: General;  Laterality: N/A;   MALONEY DILATION  N/A 10/16/2013   Procedure: Dorothyann Gather;  Surgeon: Suzette Espy, MD;  Location: [facility];  Service: Endoscopy;  Laterality: N/AFredricka Jenny PLACEMENT  [id number]   Rt   IJ  Port-A-Cath  dr.burney   STERNAL WIRES REMOVAL  01/23/2012   Procedure: STERNAL WIRES REMOVAL;  Surgeon: Norita Beauvais, MD;  Location: [facility] OR;  Service: Thoracic;  Laterality: N/A;   THORACOTOMY  [id number]   transsmanubrial anterolaterl thoracotomy with chest wall resection includinf the first rib and left  upper lobectomy for Pancoast turmor   TUBAL LIGATION     Past Medical History:  Diagnosis Date   Anxiety    Arthritis    Constipation 02/17/2015   Depression, recurrent (HCC) 07/24/2020   Emphysema lung (HCC)  Fibromyalgia    GERD (gastroesophageal reflux disease)    Hypertension    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   Menopausal vaginal dryness 02/17/2015   BP 116/74   Pulse 60   Ht 5\' 3"  (1.6 m)   Wt 142 lb 3.2 oz (64.5 kg)   SpO2 97%   BMI 25.19 kg/m   Opioid Risk Score:   Fall Risk Score:  `1  Depression screen [facility] 2/9     06/15/2023    9:46 AM 04/18/2023   10:51 AM 03/30/2023    1:58 PM 03/22/2023   10:05 AM 03/07/2023   10:16 AM 12/06/2022   10:34 AM 10/25/2022   10:06 AM  Depression screen PHQ 2/9  Decreased Interest 1 1 0 0 1 0 0  Down, Depressed, Hopeless 1 0 0 0 0  0  PHQ - 2 Score 2 1 0 0 1 0 0  Altered sleeping  0   1  0  Tired, decreased energy  1   1  1   Change in appetite  0   0  0  Feeling bad or failure about yourself   0   0  0  Trouble concentrating  0   0  0  Moving slowly or fidgety/restless  0   1  0  Suicidal thoughts  0   0  0  PHQ-9 Score  2   4  1   Difficult doing work/chores     Not difficult at all  Somewhat difficult     Review of Systems  Musculoskeletal:  Positive for back pain.  All other systems reviewed and are negative.      Objective:   Physical Exam        Assessment & Plan:  Chronic Bilateral Low Back Pain without Sciatica:  Continue HEP as Tolerated. Continue to Monitor.  Chronic Left Shoulder Pain: She has completed Physical Therapy. Continue HEP as Tolerated. Continue to Monitor.  Chronic Pain Syndrome: Continue Hydrocodone  5mg  /325 0.5- 1 tablet daily as needed for pain. We will continue the opioid monitoring program, this consists of regular clinic visits, examinations, urine drug screen, pill counts as well as use of [place]  Controlled Substance Reporting system. A 12 month History has been reviewed on the [place]  Controlled Substance Reporting System on 12/06/2022     F/U in 3 months

## 2023-06-15 ENCOUNTER — Encounter: Payer: 59 | Attending: Registered Nurse | Admitting: Registered Nurse

## 2023-06-15 ENCOUNTER — Encounter: Payer: Self-pay | Admitting: Registered Nurse

## 2023-06-15 VITALS — BP 116/74 | HR 60 | Ht 63.0 in | Wt 142.2 lb

## 2023-06-15 DIAGNOSIS — G894 Chronic pain syndrome: Secondary | ICD-10-CM | POA: Diagnosis not present

## 2023-06-15 DIAGNOSIS — G8929 Other chronic pain: Secondary | ICD-10-CM | POA: Diagnosis not present

## 2023-06-15 DIAGNOSIS — Z5181 Encounter for therapeutic drug level monitoring: Secondary | ICD-10-CM | POA: Diagnosis not present

## 2023-06-15 DIAGNOSIS — Z79891 Long term (current) use of opiate analgesic: Secondary | ICD-10-CM | POA: Insufficient documentation

## 2023-06-15 DIAGNOSIS — M545 Low back pain, unspecified: Secondary | ICD-10-CM | POA: Diagnosis not present

## 2023-06-15 MED ORDER — TRAMADOL HCL 50 MG PO TABS: 50.0000 mg | ORAL_TABLET | Freq: Every day | ORAL | 0 refills | Status: DC | PRN

## 2023-06-15 MED ORDER — HYDROCODONE-ACETAMINOPHEN 5-325 MG PO TABS: 1.0000 | ORAL_TABLET | Freq: Every day | ORAL | 0 refills | Status: DC | PRN

## 2023-06-15 NOTE — Patient Instructions (Addendum)
 Send a My Chart Message in a week with update on medication change.  Pharmacy was called and Hydrocodone  prescription was canceled.

## 2023-06-29 DIAGNOSIS — B078 Other viral warts: Secondary | ICD-10-CM | POA: Diagnosis not present

## 2023-06-29 DIAGNOSIS — L821 Other seborrheic keratosis: Secondary | ICD-10-CM | POA: Diagnosis not present

## 2023-06-29 DIAGNOSIS — L578 Other skin changes due to chronic exposure to nonionizing radiation: Secondary | ICD-10-CM | POA: Diagnosis not present

## 2023-06-29 DIAGNOSIS — D1801 Hemangioma of skin and subcutaneous tissue: Secondary | ICD-10-CM | POA: Diagnosis not present

## 2023-06-29 DIAGNOSIS — L918 Other hypertrophic disorders of the skin: Secondary | ICD-10-CM | POA: Diagnosis not present

## 2023-06-29 DIAGNOSIS — L728 Other follicular cysts of the skin and subcutaneous tissue: Secondary | ICD-10-CM | POA: Diagnosis not present

## 2023-06-29 DIAGNOSIS — R238 Other skin changes: Secondary | ICD-10-CM | POA: Diagnosis not present

## 2023-06-29 DIAGNOSIS — L814 Other melanin hyperpigmentation: Secondary | ICD-10-CM | POA: Diagnosis not present

## 2023-07-05 ENCOUNTER — Telehealth: Payer: Self-pay | Admitting: Registered Nurse

## 2023-07-05 MED ORDER — HYDROCODONE-ACETAMINOPHEN 5-325 MG PO TABS: 1.0000 | ORAL_TABLET | Freq: Every day | ORAL | 0 refills | Status: DC | PRN

## 2023-07-05 NOTE — Telephone Encounter (Signed)
 PMP was Reviewed Tramadol  discontinued.  Hydrocodone  e-scribed to pharmacy. Ms. Nikolic is aware via My-Chart message.

## 2023-07-17 ENCOUNTER — Ambulatory Visit: Payer: Self-pay

## 2023-07-17 NOTE — Telephone Encounter (Signed)
 noted

## 2023-07-17 NOTE — Telephone Encounter (Signed)
 Pt refused triage at this time. Pt wanted blood work ordered without an appt. Pt stated she will callback and make appt if symptoms worsen.       Copied from CRM [id number]. Topic: Clinical - Red Word Triage >> Jul 17, 2023  1:33 PM Tiffany H wrote: Red Word that prompted transfer to Nurse Triage:  Patient called to advise that she's been having trouble with her stomach for the past couple of months. She advised that she random things make her nauseated - after dinner she had toast that made her throw up. She feels full often. Patient advised that there's a sharp pain her stomach between ribs not gas or constipation. Comes in intermittent flashes that spike at a pain level of 7 or 8. Patient is scheduled to [facility] from [place] to [place] in August.   Last episode was last Thursday night. Patient has painful stomach episodes a few times a week.

## 2023-09-06 ENCOUNTER — Ambulatory Visit: Payer: Self-pay

## 2023-09-06 NOTE — Telephone Encounter (Signed)
 Pt has appt

## 2023-09-06 NOTE — Telephone Encounter (Signed)
 FYI Only or Action Required?: Action required by provider: request for appointment.  Patient was last seen in primary care on 03/07/2023 by Cathlene Marry Lenis, FNP.  Called Nurse Triage reporting Facial Pain.  Symptoms began several days ago.  Interventions attempted: OTC medications: neosporin .  Symptoms are:  gradually worsening.  Triage Disposition: See PCP When Office is Open (Within 3 Days)  Patient/caregiver understands and will follow disposition?: YesCopied from CRM [id number]. Topic: Clinical - Red Word Triage >> Sep 06, 2023  9:12 AM Farrel B wrote: [facility] that prompted transfer to Nurse Triage: facial due possible sinus infection and extremely Reason for Disposition  [1] Sinus congestion (pressure, fullness) AND [2] present > 10 days  Answer Assessment - Initial Assessment Questions Right side of nose is swollen. Pt is putting neopsorin up nostril. Pt feels she she scratched it internally with all the allergy symptoms she's experiencing. Pt requested to be seen Friday due to husband surgery today.    1. LOCATION: Where does it hurt?      Right side of nose 2. ONSET: When did the sinus pain start?  (e.g., hours, days)      Several days ago 3. SEVERITY: How bad is the pain?   (Scale 0-10; or none, mild, moderate or severe)     Only hurts to touch 4. RECURRENT SYMPTOM: Have you ever had sinus problems before? If Yes, ask: When was the last time? and What happened that time?      na 5. NASAL CONGESTION: Is the nose blocked? If Yes, ask: Can you open it or must you breathe through your mouth?     yes 6. NASAL DISCHARGE: Do you have discharge from your nose? If so ask, What color?     denies 7. FEVER: Do you have a fever? If Yes, ask: What is it, how was it measured, and when did it start?      denies 8. OTHER SYMPTOMS: Do you have any other symptoms? (e.g., sore throat, cough, earache, difficulty breathing)     Ears stuffy,  sneezing  Protocols used: Sinus Pain or Congestion-A-AH

## 2023-09-08 ENCOUNTER — Ambulatory Visit (INDEPENDENT_AMBULATORY_CARE_PROVIDER_SITE_OTHER): Admitting: Family Medicine

## 2023-09-08 ENCOUNTER — Encounter: Payer: Self-pay | Admitting: Family Medicine

## 2023-09-08 VITALS — BP 123/74 | HR 74 | Temp 97.6°F | Ht 63.0 in | Wt 140.8 lb

## 2023-09-08 DIAGNOSIS — J340 Abscess, furuncle and carbuncle of nose: Secondary | ICD-10-CM | POA: Diagnosis not present

## 2023-09-08 DIAGNOSIS — G8929 Other chronic pain: Secondary | ICD-10-CM | POA: Diagnosis not present

## 2023-09-08 DIAGNOSIS — F5101 Primary insomnia: Secondary | ICD-10-CM | POA: Diagnosis not present

## 2023-09-08 DIAGNOSIS — F339 Major depressive disorder, recurrent, unspecified: Secondary | ICD-10-CM

## 2023-09-08 DIAGNOSIS — M545 Low back pain, unspecified: Secondary | ICD-10-CM | POA: Diagnosis not present

## 2023-09-08 DIAGNOSIS — J410 Simple chronic bronchitis: Secondary | ICD-10-CM

## 2023-09-08 DIAGNOSIS — M5010 Cervical disc disorder with radiculopathy, unspecified cervical region: Secondary | ICD-10-CM

## 2023-09-08 DIAGNOSIS — Z85118 Personal history of other malignant neoplasm of bronchus and lung: Secondary | ICD-10-CM | POA: Diagnosis not present

## 2023-09-08 DIAGNOSIS — K219 Gastro-esophageal reflux disease without esophagitis: Secondary | ICD-10-CM

## 2023-09-08 DIAGNOSIS — E559 Vitamin D deficiency, unspecified: Secondary | ICD-10-CM | POA: Diagnosis not present

## 2023-09-08 DIAGNOSIS — R7989 Other specified abnormal findings of blood chemistry: Secondary | ICD-10-CM | POA: Diagnosis not present

## 2023-09-08 DIAGNOSIS — I10 Essential (primary) hypertension: Secondary | ICD-10-CM | POA: Diagnosis not present

## 2023-09-08 MED ORDER — TRAZODONE HCL 50 MG PO TABS: 50.0000 mg | ORAL_TABLET | Freq: Every day | ORAL | 0 refills | Status: DC

## 2023-09-08 MED ORDER — MUPIROCIN 2 % EX OINT: 1.0000 | TOPICAL_OINTMENT | Freq: Two times a day (BID) | CUTANEOUS | 0 refills | Status: AC

## 2023-09-08 MED ORDER — HYDROXYZINE HCL 10 MG PO TABS: 10.0000 mg | ORAL_TABLET | Freq: Every day | ORAL | 0 refills | Status: AC

## 2023-09-08 NOTE — Progress Notes (Signed)
 Subjective:  Patient ID: [name], female    DOB: [birth year], 63 y.o.   MRN: [id number]  Patient Care Team: Severa Rock HERO, FNP as PCP - General (Family Medicine) Lyndol Pama HERO, MD (Hematology and Oncology) Towana Montie SQUIBB, DO Rourk, Lamar HERO, MD as Consulting Physician (Gastroenterology)   Chief Complaint:  Facial Swelling (Right side of nostril inside nose x 4 days.  Has been using neosporin. )   HPI: [name] is a 63 y.o. female presenting on 09/08/2023 for Facial Swelling (Right side of nostril inside nose x 4 days.  Has been using neosporin. )   [name] is a 63 year old female with a history of lung cancer who presents with nasal swelling and pain.  She woke up on Tuesday with swelling on the right side of her nose, which was hard and swollen both inside and outside. By Wednesday, the swelling worsened, and she experienced a headache. She applied Neosporin with a Q-tip to the affected area, which helped reduce the swelling and pain by Thursday. As of today, the swelling persists but the pain has subsided. No current nasal pain but reports persistent swelling.  She experienced gastrointestinal upset on Wednesday, attributing it to something she ate, which led to frequent bathroom visits. She used ice compresses for her headache.  She is currently taking Lexapro  for depression, vaginal estrogen, Trelegy for respiratory issues, hydrochlorothiazide  and losartan  for blood pressure, and hydrocodone  as needed for back and shoulder pain. She also uses a mouthwash and has a dental plate, and reports having had thrush in the past.  She has a history of lung cancer treated with radiation, chemotherapy, and surgery in 2012. She undergoes regular screenings with a nurse named Lauraine Ee at [facility]. She reports occasional headaches and seeing spots when overheated or anxious, but her blood pressure is generally well-controlled with her current medication  regimen.  She experiences difficulty sleeping and has tried various medications, including hydroxyzine  and trazodone , but finds them too sedating. She takes melatonin regularly and has a history of using Xanax  for sleep, which was discontinued.          Relevant past medical, surgical, family, and social history reviewed and updated as indicated.  Allergies and medications reviewed and updated. Data reviewed: Chart in Epic.   Past Medical History:  Diagnosis Date   Anxiety    Arthritis    Constipation 02/17/2015   Depression, recurrent (HCC) 07/24/2020   Emphysema lung (HCC)    Fibromyalgia    GERD (gastroesophageal reflux disease)    Hypertension    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   Menopausal vaginal dryness 02/17/2015    Past Surgical History:  Procedure Laterality Date   CHOLECYSTECTOMY N/A 04/01/2016   Procedure: LAPAROSCOPIC CHOLECYSTECTOMY;  Surgeon: Oneil Budge, MD;  Location: [facility];  Service: General;  Laterality: N/A;   COLONOSCOPY  11/2010   normal colonoscopy from anal verge to ileocecal valve; no polyps, masses, AVMs, diverticula, or evidence of inflammation   ESOPHAGOGASTRODUODENOSCOPY N/A 10/16/2013   MFM:dryjusxp ring s/p dilation/2 cm HH. multiple 3 mm round and shallow erosions. 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 Hiatal hernia   LAPAROSCOPIC APPENDECTOMY N/A 06/21/2013   Procedure: APPENDECTOMY LAPAROSCOPIC;  Surgeon: Oneil DELENA Budge, MD;  Location: [facility];  Service: General;  Laterality: N/A;   MALONEY DILATION N/A 10/16/2013   Procedure: AGAPITO DILATION;  Surgeon:  Lamar CHRISTELLA Hollingshead, MD;  Location[facility];  Service: Endoscopy;  Laterality: N/AMERL PINON PLACEMENT  [id number]   Rt   IJ  Port-A-Cath  dr.burney   STERNAL WIRES REMOVAL  01/23/2012   Procedure: STERNAL WIRES REMOVAL;  Surgeon: Dallas KATHEE Jude, MD;  Location: [facility];  Service: Thoracic;   Laterality: N/A;   THORACOTOMY  [id number]   transsmanubrial anterolaterl thoracotomy with chest wall resection includinf the first rib and left  upper lobectomy for Pancoast turmor   TUBAL LIGATION      Social History   Socioeconomic History   Marital status: Married    Spouse name: Not on file   Number of children: Not on file   Years of education: Not on file   Highest education level: 12th grade  Occupational History   Not on file  Tobacco Use   Smoking status: Former    Current packs/day: 0.00    Average packs/day: 1.5 packs/day for 35.0 years (52.5 ttl pk-yrs)    Types: Cigarettes    Start date: 02/05/1975    Quit date: 02/04/2010    Years since quitting: 13.6   Smokeless tobacco: Never   Tobacco comments:    Quit x 8 years  Vaping Use   Vaping status: Never Used  Substance and Sexual Activity   Alcohol use: Yes    Alcohol/week: 0.0 standard drinks of alcohol    Comment: occasionally    Drug use: No   Sexual activity: Yes    Birth control/protection: Post-menopausal  Other Topics Concern   Not on file  Social History Narrative   Not on file   Social Drivers of Health   Financial Resource Strain: Low Risk  (09/06/2023)   Overall Financial Resource Strain (CARDIA)    Difficulty of Paying Living Expenses: Not very hard  Food Insecurity: No Food Insecurity (09/06/2023)   Hunger Vital Sign    Worried About Running Out of Food in the Last Year: Never true    Ran Out of Food in the Last Year: Never true  Transportation Needs: No Transportation Needs (09/06/2023)   PRAPARE - Administrator, Civil Service (Medical): No    Lack of Transportation (Non-Medical): No  Physical Activity: Unknown (09/06/2023)   Exercise Vital Sign    Days of Exercise per Week: 2 days    Minutes of Exercise per Session: Patient declined  Stress: Stress Concern Present (09/06/2023)   [employer] of Occupational Health - Occupational Stress Questionnaire    Feeling of Stress: To  some extent  Social Connections: Socially Integrated (09/06/2023)   Social Connection and Isolation Panel    Frequency of Communication with Friends and Family: More than three times a week    Frequency of Social Gatherings with Friends and Family: Once a week    Attends Religious Services: More than 4 times per year    Active Member of [employer] or Organizations: Yes    Attends Engineer, structural: More than 4 times per year    Marital Status: Married  Catering manager Violence: Not At Risk (04/18/2023)   Humiliation, Afraid, Rape, and Kick questionnaire    Fear of Current or Ex-Partner: No    Emotionally Abused: No    Physically Abused: No    Sexually Abused: No    Outpatient Encounter Medications as of 09/08/2023  Medication Sig   escitalopram  (LEXAPRO ) 20 MG tablet TAKE ONE TABLET DAILY   estradiol  (ESTRACE  VAGINAL) 0.1 MG/GM vaginal cream Use  pea sized amount in vagina nightly for 2 weeks then use 2-3 x weekly, use finger to insert   Fluticasone-Umeclidin-Vilant (TRELEGY ELLIPTA ) 100-62.5-25 MCG/ACT AEPB Inhale 1 puff into the lungs daily.   hydrochlorothiazide  (HYDRODIURIL ) 25 MG tablet TAKE ONE TABLET ONCE DAILY   HYDROcodone -acetaminophen  (NORCO/VICODIN) 5-325 MG tablet Take 1 tablet by mouth daily as needed for moderate pain (pain score 4-6). Take 0.5-1 tablet daily as needed. Do not fill Before 08/01/2023   hydrOXYzine  (ATARAX ) 10 MG tablet Take 1 tablet (10 mg total) by mouth at bedtime.   losartan  (COZAAR ) 100 MG tablet TAKE ONE TABLET BY MOUTH DAILY   mupirocin  ointment (BACTROBAN ) 2 % Apply 1 Application topically 2 (two) times daily for 7 days.   [DISCONTINUED] traZODone  (DESYREL ) 50 MG tablet Take 1 tablet (50 mg total) by mouth at bedtime.   No facility-administered encounter medications on file as of 09/08/2023.    Allergies  Allergen Reactions   Lisinopril Swelling    Of lips Of lips   Nitrofuran Derivatives     Pertinent ROS per HPI, otherwise  unremarkable      Objective:  BP 123/74   Pulse 74   Temp 97.6 F (36.4 C)   Ht 5' 3 (1.6 m)   Wt 140 lb 12.8 oz (63.9 kg)   SpO2 97%   BMI 24.94 kg/m    Wt Readings from Last 3 Encounters:  09/08/23 140 lb 12.8 oz (63.9 kg)  06/15/23 142 lb 3.2 oz (64.5 kg)  04/18/23 141 lb 8 oz (64.2 kg)    Physical Exam Constitutional:      General: She is not in acute distress.    Appearance: Normal appearance. She is normal weight. She is not ill-appearing, toxic-appearing or diaphoretic.  HENT:     Head: Normocephalic and atraumatic.     Nose:      Comments: Localized swelling and erythema with central scab in right nare    Mouth/Throat:     Mouth: Mucous membranes are moist.  Eyes:     Conjunctiva/sclera: Conjunctivae normal.     Pupils: Pupils are equal, round, and reactive to light.  Cardiovascular:     Rate and Rhythm: Normal rate and regular rhythm.     Heart sounds: Normal heart sounds.  Pulmonary:     Effort: Pulmonary effort is normal.     Comments: Hyperresonance of left lung Musculoskeletal:     Cervical back: Neck supple.     Right lower leg: No edema.     Left lower leg: No edema.  Skin:    General: Skin is warm and dry.     Capillary Refill: Capillary refill takes less than 2 seconds.  Neurological:     General: No focal deficit present.     Mental Status: She is alert and oriented to person, place, and time.  Psychiatric:        Mood and Affect: Mood normal.        Behavior: Behavior normal.        Thought Content: Thought content normal.        Judgment: Judgment normal.     Results for orders placed or performed in visit on 04/18/23  Cytology - PAP( [facility])   Collection Time: 04/18/23 11:01 AM  Result Value Ref Range   High risk HPV Negative    Adequacy      Satisfactory for evaluation; transformation zone component ABSENT.   Diagnosis      - Negative for intraepithelial lesion  or malignancy (NILM)   Comment Normal Reference Range HPV -  Negative   POCT occult blood stool   Collection Time: 04/18/23 11:43 AM  Result Value Ref Range   Fecal Occult Blood, POC Negative Negative   Card #1 Date     Card #2 Fecal Occult Blod, POC     Card #2 Date     Card #3 Fecal Occult Blood, POC     Card #3 Date         Pertinent labs & imaging results that were available during my care of the patient were reviewed by me and considered in my medical decision making.  Assessment & Plan:  Payson was seen today for facial swelling.  Diagnoses and all orders for this visit:  Nasal abscess -     mupirocin  ointment (BACTROBAN ) 2 %; Apply 1 Application topically 2 (two) times daily for 7 days. -     CBC with Differential/Platelet  Simple chronic bronchitis (HCC) -     CBC with Differential/Platelet  Elevated LFTs -     CMP14+EGFR  Depression, recurrent (HCC) -     Thyroid  Panel With TSH  Gastroesophageal reflux disease without esophagitis -     CBC with Differential/Platelet  Primary hypertension -     CMP14+EGFR -     CBC with Differential/Platelet -     Lipid panel  Cervical disc disorder with radiculopathy of cervical region -     CMP14+EGFR -     VITAMIN D  25 Hydroxy (Vit-D Deficiency, Fractures)  Chronic bilateral low back pain without sciatica -     CMP14+EGFR -     VITAMIN D  25 Hydroxy (Vit-D Deficiency, Fractures)  Vitamin D  deficiency -     CMP14+EGFR -     VITAMIN D  25 Hydroxy (Vit-D Deficiency, Fractures)  History of lung cancer -     CBC with Differential/Platelet  Primary insomnia -     hydrOXYzine  (ATARAX ) 10 MG tablet; Take 1 tablet (10 mg total) by mouth at bedtime.     Nasal abscess Swelling and hardness on the left side of the nose, likely due to a nasal abscess. Initial treatment with Neosporin provided some relief. Symptoms have improved significantly with reduced pain and swelling. 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 - Prescribe mupirocin  ointment, apply twice daily for seven days to both  sides of the nose. - Monitor for recurrence of swelling; if it occurs, consider oral antibiotics.  Depression and anxiety Currently managed with Lexapro .  Chronic back and shoulder pain Chronic pain managed with hydrocodone  as needed. She has been on this medication for ten years with good effect. No constipation reported.  Hypertension Blood pressure generally well-controlled with hydrochlorothiazide  and losartan . Occasional headaches and visual disturbances when overheated or anxious.  Insomnia Difficulty sleeping, previously tried hydroxyzine  and trazodone  with limited success. Currently using melatonin 20 mg. Hydroxyzine  caused excessive morning drowsiness. - Prescribe hydroxyzine  12.5 mg tablets, take at night as needed for sleep. Can be cut in half if needed.  Vitamin D  deficiency Not currently taking vitamin D  supplementation.  General Health Maintenance Routine health maintenance discussed, including medication refills and lab work. - Order lab work including liver function, thyroid , and vitamin D  levels.          Continue all other maintenance medications.  Follow up plan: Return in 6 months (on 03/10/2024), or if symptoms worsen or fail to improve.   Continue healthy lifestyle choices, including diet (rich in fruits, vegetables, and lean proteins,  and low in salt and simple carbohydrates) and exercise (at least 30 minutes of moderate physical activity daily).  Educational handout given for insomnia   The above assessment and management plan was discussed with the patient. The patient verbalized understanding of and has agreed to the management plan. Patient is aware to call the clinic if they develop any new symptoms or if symptoms persist or worsen. Patient is aware when to return to the clinic for a follow-up visit. Patient educated on when it is appropriate to go to the emergency department.   Rosaline Bruns, FNP-C [facility] [phone]

## 2023-09-09 LAB — CBC WITH DIFFERENTIAL/PLATELET
Basophils Absolute: 0.1 x10E3/uL (ref 0.0–0.2)
Basos: 2 %
EOS (ABSOLUTE): 0.4 x10E3/uL (ref 0.0–0.4)
Eos: 7 %
Hematocrit: 36 % (ref 34.0–46.6)
Hemoglobin: 12 g/dL (ref 11.1–15.9)
Immature Grans (Abs): 0 x10E3/uL (ref 0.0–0.1)
Immature Granulocytes: 0 %
Lymphocytes Absolute: 1.6 x10E3/uL (ref 0.7–3.1)
Lymphs: 27 %
MCH: 30 pg (ref 26.6–33.0)
MCHC: 33.3 g/dL (ref 31.5–35.7)
MCV: 90 fL (ref 79–97)
Monocytes Absolute: 0.6 x10E3/uL (ref 0.1–0.9)
Monocytes: 11 %
Neutrophils Absolute: 3.2 x10E3/uL (ref 1.4–7.0)
Neutrophils: 53 %
Platelets: 319 x10E3/uL (ref 150–450)
RBC: 4 x10E6/uL (ref 3.77–5.28)
RDW: 12.5 % (ref 11.7–15.4)
WBC: 5.9 x10E3/uL (ref 3.4–10.8)

## 2023-09-09 LAB — CMP14+EGFR
ALT: 36 IU/L — ABNORMAL HIGH (ref 0–32)
AST: 38 IU/L (ref 0–40)
Albumin: 4.4 g/dL (ref 3.9–4.9)
Alkaline Phosphatase: 210 IU/L — ABNORMAL HIGH (ref 44–121)
BUN/Creatinine Ratio: 19 (ref 12–28)
BUN: 22 mg/dL (ref 8–27)
Bilirubin Total: 0.4 mg/dL (ref 0.0–1.2)
CO2: 23 mmol/L (ref 20–29)
Calcium: 9.4 mg/dL (ref 8.7–10.3)
Chloride: 103 mmol/L (ref 96–106)
Creatinine, Ser: 1.15 mg/dL — ABNORMAL HIGH (ref 0.57–1.00)
Globulin, Total: 2.5 g/dL (ref 1.5–4.5)
Glucose: 109 mg/dL — ABNORMAL HIGH (ref 70–99)
Potassium: 3.8 mmol/L (ref 3.5–5.2)
Sodium: 143 mmol/L (ref 134–144)
Total Protein: 6.9 g/dL (ref 6.0–8.5)
eGFR: 53 mL/min/1.73 — ABNORMAL LOW (ref >59)

## 2023-09-09 LAB — LIPID PANEL
Chol/HDL Ratio: 3.1 ratio (ref 0.0–4.4)
Cholesterol, Total: 199 mg/dL (ref 100–199)
HDL: 65 mg/dL (ref >39)
LDL Chol Calc (NIH): 109 mg/dL — ABNORMAL HIGH (ref 0–99)
Triglycerides: 141 mg/dL (ref 0–149)
VLDL Cholesterol Cal: 25 mg/dL (ref 5–40)

## 2023-09-09 LAB — THYROID PANEL WITH TSH
Free Thyroxine Index: 2.2 (ref 1.2–4.9)
T3 Uptake Ratio: 24 % (ref 24–39)
T4, Total: 9 ug/dL (ref 4.5–12.0)
TSH: 1.54 u[IU]/mL (ref 0.450–4.500)

## 2023-09-09 LAB — VITAMIN D 25 HYDROXY (VIT D DEFICIENCY, FRACTURES): Vit D, 25-Hydroxy: 36 ng/mL (ref 30.0–100.0)

## 2023-09-11 ENCOUNTER — Ambulatory Visit: Payer: Self-pay | Admitting: Family Medicine

## 2023-09-13 ENCOUNTER — Other Ambulatory Visit

## 2023-09-13 DIAGNOSIS — N289 Disorder of kidney and ureter, unspecified: Secondary | ICD-10-CM | POA: Diagnosis not present

## 2023-09-14 ENCOUNTER — Ambulatory Visit: Payer: Self-pay | Admitting: Family Medicine

## 2023-09-14 ENCOUNTER — Telehealth: Payer: Self-pay

## 2023-09-14 LAB — BMP8+EGFR
BUN/Creatinine Ratio: 21 (ref 12–28)
BUN: 22 mg/dL (ref 8–27)
CO2: 22 mmol/L (ref 20–29)
Calcium: 9.7 mg/dL (ref 8.7–10.3)
Chloride: 99 mmol/L (ref 96–106)
Creatinine, Ser: 1.07 mg/dL — ABNORMAL HIGH (ref 0.57–1.00)
Glucose: 104 mg/dL — ABNORMAL HIGH (ref 70–99)
Potassium: 4.4 mmol/L (ref 3.5–5.2)
Sodium: 140 mmol/L (ref 134–144)
eGFR: 58 mL/min/1.73 — ABNORMAL LOW (ref >59)

## 2023-09-14 NOTE — Telephone Encounter (Signed)
 Copied from CRM #[id number]. Topic: Clinical - Request for Lab/Test Order >> Sep 14, 2023  2:06 PM Turkey B wrote: Reason for CRM: gave patient lab results

## 2023-09-19 ENCOUNTER — Encounter: Admitting: Registered Nurse

## 2023-09-19 ENCOUNTER — Encounter: Admitting: Nurse Practitioner

## 2023-10-18 ENCOUNTER — Other Ambulatory Visit: Payer: Self-pay | Admitting: Family Medicine

## 2023-10-18 ENCOUNTER — Encounter: Payer: Self-pay | Admitting: Registered Nurse

## 2023-10-18 ENCOUNTER — Encounter: Attending: Registered Nurse | Admitting: Registered Nurse

## 2023-10-18 VITALS — BP 147/91 | HR 56 | Ht 63.0 in | Wt 143.8 lb

## 2023-10-18 DIAGNOSIS — M47816 Spondylosis without myelopathy or radiculopathy, lumbar region: Secondary | ICD-10-CM | POA: Insufficient documentation

## 2023-10-18 DIAGNOSIS — R001 Bradycardia, unspecified: Secondary | ICD-10-CM | POA: Diagnosis not present

## 2023-10-18 DIAGNOSIS — G894 Chronic pain syndrome: Secondary | ICD-10-CM | POA: Diagnosis not present

## 2023-10-18 DIAGNOSIS — M546 Pain in thoracic spine: Secondary | ICD-10-CM | POA: Insufficient documentation

## 2023-10-18 DIAGNOSIS — Z79891 Long term (current) use of opiate analgesic: Secondary | ICD-10-CM | POA: Insufficient documentation

## 2023-10-18 DIAGNOSIS — G8929 Other chronic pain: Secondary | ICD-10-CM | POA: Diagnosis not present

## 2023-10-18 DIAGNOSIS — F339 Major depressive disorder, recurrent, unspecified: Secondary | ICD-10-CM

## 2023-10-18 DIAGNOSIS — Z5181 Encounter for therapeutic drug level monitoring: Secondary | ICD-10-CM | POA: Diagnosis not present

## 2023-10-18 MED ORDER — HYDROCODONE-ACETAMINOPHEN 5-325 MG PO TABS: 1.0000 | ORAL_TABLET | Freq: Every day | ORAL | 0 refills | Status: DC | PRN

## 2023-10-18 NOTE — Progress Notes (Addendum)
 Subjective:    Patient ID: [name], female    DOB: [birth year], 63 y.o.   MRN: [id number]  HPI: [name] is a 63 y.o. female who returns for follow up appointment for chronic pain and medication refill. She states her pain is located in her mid- lower back and left lower extremity and left foot pain. He rates his pain 7.His current exercise regime is walking and performing stretching exercises.  [name] Morphine  equivalent is 5.00 MME.   Oral Swab was Performed today.     Pain Inventory Average Pain 7 Pain Right Now 7 My pain is constant, burning, dull, tingling, and aching  In the last 24 hours, has pain interfered with the following? General activity 5 Relation with others 4 Enjoyment of life 4 What TIME of day is your pain at its worst? morning , daytime, and evening Sleep (in general) NA  Pain is worse with: sitting, inactivity, and some activites Pain improves with: medication Relief from Meds: 8  Family History  Problem Relation Age of Onset   Cancer Mother        lung   Hypertension Father    Stroke Father    Hypertension Sister    Heart disease Sister    Cancer Brother 10       pancreatic   Colon cancer Neg Hx    Breast cancer Neg Hx    Social History   Socioeconomic History   Marital status: Married    Spouse name: Not on file   Number of children: Not on file   Years of education: Not on file   Highest education level: 12th grade  Occupational History   Not on file  Tobacco Use   Smoking status: Former    Current packs/day: 0.00    Average packs/day: 1.5 packs/day for 35.0 years (52.5 ttl pk-yrs)    Types: Cigarettes    Start date: 02/05/1975    Quit date: 02/04/2010    Years since quitting: 13.7   Smokeless tobacco: Never   Tobacco comments:    Quit x 8 years  Vaping Use   Vaping status: Never Used  Substance and Sexual Activity   Alcohol use: Yes    Alcohol/week: 0.0 standard drinks of alcohol    Comment: occasionally     Drug use: No   Sexual activity: Yes    Birth control/protection: Post-menopausal  Other Topics Concern   Not on file  Social History Narrative   Not on file   Social Drivers of Health   Financial Resource Strain: Low Risk  (09/06/2023)   Overall Financial Resource Strain (CARDIA)    Difficulty of Paying Living Expenses: Not very hard  Food Insecurity: No Food Insecurity (09/06/2023)   Hunger Vital Sign    Worried About Running Out of Food in the Last Year: Never true    Ran Out of Food in the Last Year: Never true  Transportation Needs: No Transportation Needs (09/06/2023)   PRAPARE - Administrator, Civil Service (Medical): No    Lack of Transportation (Non-Medical): No  Physical Activity: Unknown (09/06/2023)   Exercise Vital Sign    Days of Exercise per Week: 2 days    Minutes of Exercise per Session: Patient declined  Stress: Stress Concern Present (09/06/2023)   [employer] of Occupational Health - Occupational Stress Questionnaire    Feeling of Stress: To some extent  Social Connections: Socially Integrated (09/06/2023)   Social Connection and Isolation Panel  Frequency of Communication with Friends and Family: More than three times a week    Frequency of Social Gatherings with Friends and Family: Once a week    Attends Religious Services: More than 4 times per year    Active Member of Clubs or Organizations: Yes    Attends Banker Meetings: More than 4 times per year    Marital Status: Married   Past Surgical History:  Procedure Laterality Date   CHOLECYSTECTOMY N/A 04/01/2016   Procedure: LAPAROSCOPIC CHOLECYSTECTOMY;  Surgeon: Oneil Budge, MD;  Location: [facility];  Service: General;  Laterality: N/A;   COLONOSCOPY  11/2010   normal colonoscopy from anal verge to ileocecal valve; no polyps, masses, AVMs, diverticula, or evidence of inflammation   ESOPHAGOGASTRODUODENOSCOPY N/A 10/16/2013   MFM:dryjusxp ring s/p dilation/2 cm HH. multiple 3 mm  round and shallow erosions. 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 Hiatal hernia   LAPAROSCOPIC APPENDECTOMY N/A 06/21/2013   Procedure: APPENDECTOMY LAPAROSCOPIC;  Surgeon: Oneil DELENA Budge, MD;  Location: [facility];  Service: General;  Laterality: N/A;   MALONEY DILATION N/A 10/16/2013   Procedure: AGAPITO HODGKIN;  Surgeon: Lamar CHRISTELLA Hollingshead, MD;  [facility];  Service: Endoscopy;  Laterality: N/AMERL PINON PLACEMENT  [id number]   Rt   IJ  Port-A-Cath  dr.burney   STERNAL WIRES REMOVAL  01/23/2012   Procedure: STERNAL WIRES REMOVAL;  Surgeon: Dallas KATHEE Jude, MD;  Location: [facility];  Service: Thoracic;  Laterality: N/A;   THORACOTOMY  [id number]   transsmanubrial anterolaterl thoracotomy with chest wall resection includinf the first rib and left  upper lobectomy for Pancoast turmor   TUBAL LIGATION     Past Surgical History:  Procedure Laterality Date   CHOLECYSTECTOMY N/A 04/01/2016   Procedure: LAPAROSCOPIC CHOLECYSTECTOMY;  Surgeon: Oneil Budge, MD;  Location: [facility];  Service: General;  Laterality: N/A;   COLONOSCOPY  11/2010   normal colonoscopy from anal verge to ileocecal valve; no polyps, masses, AVMs, diverticula, or evidence of inflammation   ESOPHAGOGASTRODUODENOSCOPY N/A 10/16/2013   MFM:dryjusxp ring s/p dilation/2 cm HH. multiple 3 mm round and shallow erosions. 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 Hiatal hernia   LAPAROSCOPIC APPENDECTOMY N/A 06/21/2013   Procedure: APPENDECTOMY LAPAROSCOPIC;  Surgeon: Oneil DELENA Budge, MD;  Location: [facility];  Service: General;  Laterality: N/A;   MALONEY DILATION N/A 10/16/2013   Procedure: AGAPITO HODGKIN;  Surgeon: Lamar CHRISTELLA Hollingshead, MD;  [facility];  Service: Endoscopy;  Laterality: N/AMERL PINON PLACEMENT  [id number]   Rt   IJ  Port-A-Cath   dr.burney   STERNAL WIRES REMOVAL  01/23/2012   Procedure: STERNAL WIRES REMOVAL;  Surgeon: Dallas KATHEE Jude, MD;  Location: [facility];  Service: Thoracic;  Laterality: N/A;   THORACOTOMY  [id number]   transsmanubrial anterolaterl thoracotomy with chest wall resection includinf the first rib and left  upper lobectomy for Pancoast turmor   TUBAL LIGATION     Past Medical History:  Diagnosis Date   Anxiety    Arthritis    Constipation 02/17/2015   Depression, recurrent 07/24/2020   Emphysema lung (HCC)    Fibromyalgia    GERD (gastroesophageal reflux disease)    Hypertension    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   Menopausal vaginal dryness 02/17/2015   BP (!) 160/99   Pulse ROLLEN)  52   Ht 5' 3 (1.6 m)   Wt 143 lb 12.8 oz (65.2 kg)   SpO2 99%   BMI 25.47 kg/m   Opioid Risk Score:   Fall Risk Score:  `1  Depression screen [facility] 2/9     10/18/2023   10:48 AM 10/18/2023   10:33 AM 09/08/2023    3:58 PM 06/15/2023    9:46 AM 04/18/2023   10:51 AM 03/30/2023    1:58 PM 03/22/2023   10:05 AM  Depression screen PHQ 2/9  Decreased Interest 1 0 0 1 1 0 0  Down, Depressed, Hopeless 1 0 0 1 0 0 0  PHQ - 2 Score 2 0 0 2 1 0 0  Altered sleeping   2  0    Tired, decreased energy   1  1    Change in appetite   0  0    Feeling bad or failure about yourself    0  0    Trouble concentrating   0  0    Moving slowly or fidgety/restless   0  0    Suicidal thoughts   0  0    PHQ-9 Score   3  2    Difficult doing work/chores   Not difficult at all         Review of Systems  Musculoskeletal:  Positive for back pain.       Bilateral leg  Psychiatric/Behavioral:  Positive for dysphoric mood.   All other systems reviewed and are negative.      Objective:   Physical Exam Vitals and nursing note reviewed.  Constitutional:      Appearance: Normal appearance.  Cardiovascular:     Rate and Rhythm: Regular rhythm. Bradycardia present.     Pulses: Normal pulses.     Heart sounds: Normal  heart sounds.  Pulmonary:     Effort: Pulmonary effort is normal.     Breath sounds: Normal breath sounds.  Musculoskeletal:     Comments: Normal Muscle Bulk and Muscle Testing Reveals:  Upper Extremities: Full ROM and Muscle Strength 5/5  Lumbar Paraspinal Tenderness: L-4-L-5 Lower Extremities: Full ROM and Muscle Strength 5/5 Arises from Chair with ease Narrow Based  Gait     Skin:    General: Skin is warm and dry.  Neurological:     Mental Status: She is alert and oriented to person, place, and time.           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 Continue to Monitor. 10/18/2023 Chronic Left Shoulder Pain: No complaints today. Continue HEP as Tolerated. Continue to Monitor. 10/18/2023 Bilateral Sacroiliitis : S/P Bilateral Sacroiliac Injection on 03/30/2023 : with no relief noted. Continue to Monitor. 09/24//2025 Chronic Pain Syndrome:  Hydrocodone  5/325 mg 0.5- 1 tablet daily.  We will continue the opioid monitoring program, this consists of regular clinic visits, examinations, urine drug screen, pill counts as well as use of [place]  Controlled Substance Reporting system. A 12 month History has been reviewed on the [place]  Controlled Substance Reporting System on 10/18/2022   Bradycardia: Apical Pulse Checked: She will F/U with her PCP, she verbalizes understanding.    F/U in 2 months

## 2023-10-24 ENCOUNTER — Encounter: Payer: Self-pay | Admitting: Family Medicine

## 2023-10-24 ENCOUNTER — Ambulatory Visit (INDEPENDENT_AMBULATORY_CARE_PROVIDER_SITE_OTHER): Admitting: Family Medicine

## 2023-10-24 VITALS — BP 122/71 | HR 60 | Temp 97.0°F | Ht 63.0 in | Wt 145.0 lb

## 2023-10-24 DIAGNOSIS — L820 Inflamed seborrheic keratosis: Secondary | ICD-10-CM | POA: Diagnosis not present

## 2023-10-24 DIAGNOSIS — L918 Other hypertrophic disorders of the skin: Secondary | ICD-10-CM | POA: Diagnosis not present

## 2023-10-24 LAB — DRUG TOX MONITOR 1 W/CONF, ORAL FLD
Amphetamines: NEGATIVE ng/mL (ref <10)
Barbiturates: NEGATIVE ng/mL (ref <10)
Benzodiazepines: NEGATIVE ng/mL (ref <0.50)
Buprenorphine: NEGATIVE ng/mL (ref <0.10)
Cocaine: NEGATIVE ng/mL (ref <5.0)
Codeine: NEGATIVE ng/mL (ref <2.5)
Dihydrocodeine: NEGATIVE ng/mL (ref <2.5)
Fentanyl: NEGATIVE ng/mL (ref <0.10)
Heroin Metabolite: NEGATIVE ng/mL (ref <1.0)
Hydrocodone: 7.4 ng/mL — ABNORMAL HIGH (ref <2.5)
Hydromorphone: NEGATIVE ng/mL (ref <2.5)
MARIJUANA: NEGATIVE ng/mL (ref <2.5)
MDMA: NEGATIVE ng/mL (ref <10)
Meprobamate: NEGATIVE ng/mL (ref <2.5)
Methadone: NEGATIVE ng/mL (ref <5.0)
Morphine: NEGATIVE ng/mL (ref <2.5)
Nicotine Metabolite: NEGATIVE ng/mL (ref <5.0)
Norhydrocodone: NEGATIVE ng/mL (ref <2.5)
Noroxycodone: NEGATIVE ng/mL (ref <2.5)
Opiates: POSITIVE ng/mL — AB (ref <2.5)
Oxycodone: NEGATIVE ng/mL (ref <2.5)
Oxymorphone: NEGATIVE ng/mL (ref <2.5)
Phencyclidine: NEGATIVE ng/mL (ref <10)
Tapentadol: NEGATIVE ng/mL (ref <5.0)
Tramadol: NEGATIVE ng/mL (ref <5.0)
Zolpidem: NEGATIVE ng/mL (ref <5.0)

## 2023-10-24 LAB — DRUG TOX ALC METAB W/CON, ORAL FLD: Alcohol Metabolite: NEGATIVE ng/mL (ref <25)

## 2023-10-24 NOTE — Progress Notes (Signed)
 Subjective:  Patient ID: [name], female    DOB: Jan 17, 1961, 63 y.o.   MRN: [id number]  Patient Care Team: Severa Rock HERO, FNP as PCP - General (Family Medicine) Lyndol, Pama HERO, MD (Hematology and Oncology) Towana Montie SQUIBB, DO Rourk, Lamar HERO, MD as Consulting Physician (Gastroenterology)   Chief Complaint:  Skin Tag (Removals )   HPI: [name] is a 63 y.o. female presenting on 10/24/2023 for Skin Tag (Removals )   [name] is a 63 year old female who presents for removal of skin tags.  She has multiple skin tags located under both arms, with some being irritated by her bra. She estimates there are about two or more skin tags in these areas.  She has had skin tags removed previously from her neck.          Relevant past medical, surgical, family, and social history reviewed and updated as indicated.  Allergies and medications reviewed and updated. Data reviewed: Chart in Epic.   Past Medical History:  Diagnosis Date   Anxiety    Arthritis    Constipation 02/17/2015   Depression, recurrent 07/24/2020   Emphysema lung (HCC)    Fibromyalgia    GERD (gastroesophageal reflux disease)    Hypertension    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   Menopausal vaginal dryness 02/17/2015    Past Surgical History:  Procedure Laterality Date   CHOLECYSTECTOMY N/A 04/01/2016   Procedure: LAPAROSCOPIC CHOLECYSTECTOMY;  Surgeon: Oneil Budge, MD;  Location: [facility];  Service: General;  Laterality: N/A;   COLONOSCOPY  11/2010   normal colonoscopy from anal verge to ileocecal valve; no polyps, masses, AVMs, diverticula, or evidence of inflammation   ESOPHAGOGASTRODUODENOSCOPY N/A 10/16/2013   MFM:dryjusxp ring s/p dilation/2 cm HH. multiple 3 mm round and shallow erosions. 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 Hiatal hernia   LAPAROSCOPIC APPENDECTOMY N/A  06/21/2013   Procedure: APPENDECTOMY LAPAROSCOPIC;  Surgeon: Oneil DELENA Budge, MD;  Location: [facility];  Service: General;  Laterality: N/A;   MALONEY DILATION N/A 10/16/2013   Procedure: AGAPITO HODGKIN;  Surgeon: Lamar HERO Hollingshead, MD;  Location: [facility];  Service: Endoscopy;  Laterality: N/AMERL PINON PLACEMENT  [id number]   Rt   IJ  Port-A-Cath  [email or web address]   STERNAL WIRES REMOVAL  01/23/2012   Procedure: STERNAL WIRES REMOVAL;  Surgeon: Dallas KATHEE Jude, MD;  Location: [facility];  Service: Thoracic;  Laterality: N/A;   THORACOTOMY  [id number]   transsmanubrial anterolaterl thoracotomy with chest wall resection includinf the first rib and left  upper lobectomy for Pancoast turmor   TUBAL LIGATION      Social History   Socioeconomic History   Marital status: Married    Spouse name: Not on file   Number of children: Not on file   Years of education: Not on file   Highest education level: 12th grade  Occupational History   Not on file  Tobacco Use   Smoking status: Former    Current packs/day: 0.00    Average packs/day: 1.5 packs/day for 35.0 years (52.5 ttl pk-yrs)    Types: Cigarettes    Start date: 02/05/1975    Quit date: 02/04/2010    Years since quitting: 13.7   Smokeless tobacco: Never   Tobacco comments:    Quit x 8 years  Vaping Use   Vaping status: Never Used  Substance  and Sexual Activity   Alcohol use: Yes    Alcohol/week: 0.0 standard drinks of alcohol    Comment: occasionally    Drug use: No   Sexual activity: Yes    Birth control/protection: Post-menopausal  Other Topics Concern   Not on file  Social History Narrative   Not on file   Social Drivers of Health   Financial Resource Strain: Low Risk  (09/06/2023)   Overall Financial Resource Strain (CARDIA)    Difficulty of Paying Living Expenses: Not very hard  Food Insecurity: No Food Insecurity (09/06/2023)   Hunger Vital Sign    Worried About Running Out of Food in the Last Year: Never true    Ran Out of  Food in the Last Year: Never true  Transportation Needs: No Transportation Needs (09/06/2023)   PRAPARE - Administrator, Civil Service (Medical): No    Lack of Transportation (Non-Medical): No  Physical Activity: Unknown (09/06/2023)   Exercise Vital Sign    Days of Exercise per Week: 2 days    Minutes of Exercise per Session: Patient declined  Stress: Stress Concern Present (09/06/2023)   [employer] of Occupational Health - Occupational Stress Questionnaire    Feeling of Stress: To some extent  Social Connections: Socially Integrated (09/06/2023)   Social Connection and Isolation Panel    Frequency of Communication with Friends and Family: More than three times a week    Frequency of Social Gatherings with Friends and Family: Once a week    Attends Religious Services: More than 4 times per year    Active Member of [employer] or Organizations: Yes    Attends Engineer, structural: More than 4 times per year    Marital Status: Married  Catering manager Violence: Not At Risk (04/18/2023)   Humiliation, Afraid, Rape, and Kick questionnaire    Fear of Current or Ex-Partner: No    Emotionally Abused: No    Physically Abused: No    Sexually Abused: No    Outpatient Encounter Medications as of 10/24/2023  Medication Sig   escitalopram  (LEXAPRO ) 20 MG tablet TAKE ONE TABLET DAILY   estradiol  (ESTRACE  VAGINAL) 0.1 MG/GM vaginal cream Use pea sized amount in vagina nightly for 2 weeks then use 2-3 x weekly, use finger to insert   Fluticasone-Umeclidin-Vilant (TRELEGY ELLIPTA ) 100-62.5-25 MCG/ACT AEPB Inhale 1 puff into the lungs daily.   hydrochlorothiazide  (HYDRODIURIL ) 25 MG tablet TAKE ONE TABLET ONCE DAILY   HYDROcodone -acetaminophen  (NORCO/VICODIN) 5-325 MG tablet Take 1 tablet by mouth daily as needed for moderate pain (pain score 4-6). Take 0.5-1 tablet daily as needed. Do not fill Before 11/14/2023   hydrOXYzine  (ATARAX ) 10 MG tablet Take 1 tablet (10 mg total) by  mouth at bedtime.   losartan  (COZAAR ) 100 MG tablet TAKE ONE TABLET BY MOUTH DAILY   No facility-administered encounter medications on file as of 10/24/2023.    Allergies  Allergen Reactions   Lisinopril Swelling    Of lips Of lips   Nitrofuran Derivatives     Pertinent ROS per HPI, otherwise unremarkable      Objective:  BP 122/71   Pulse 60   Temp (!) 97 F (36.1 C)   Ht 5' 3 (1.6 m)   Wt 145 lb (65.8 kg)   SpO2 95%   BMI 25.69 kg/m    Wt Readings from Last 3 Encounters:  10/24/23 145 lb (65.8 kg)  10/18/23 143 lb 12.8 oz (65.2 kg)  09/08/23 140 lb 12.8 oz (  63.9 kg)    Physical Exam Skin:    Findings: Lesion present.         Skin excision  Date/Time: 10/24/2023 11:36 AM  Performed by: Severa Rock HERO, FNP Authorized by: Severa Rock HERO, FNP   Number of Lesions: 6 Lesion 1:    Body area: upper extremity   Upper extremity location: left axilla.   Initial size (mm): 2   Malignancy: malignancy unknown     Destruction method comment: shave Lesion 2:    Body area: upper extremity   Upper extremity location: left axilla.   Initial size (mm): 3   Malignancy: malignancy unknown     Destruction method comment: shaved Lesion 3:    Body area: upper extremity   Upper extremity location: left axilla.   Initial size (mm): 2   Malignancy: malignancy unknown     Destruction method comment: shaved Lesion 4:    Body area: upper extremity   Upper extremity location: left axilla.   Initial size (mm): 3   Malignancy: malignancy unknown     Destruction method: scissors used for extraction   Lesion 5:    Body area: upper extremity   Upper extremity location: left axilla.   Inital size (mm): 4   Malignancy: malignancy unknown     Destruction method comment: shaved Lesion 6:    Body area: upper extremity   Upper extremity location: left axilla.   Initial size (mm): 2   Malignancy: malignancy unknown     Destruction method: scissors used for extraction   Skin  excision  Date/Time: 10/24/2023 11:37 AM  Performed by: Severa Rock HERO, FNP Authorized by: Severa Rock HERO, FNP   Number of Lesions: 6 Lesion 1:    Body area: upper extremity   Upper extremity location: left axilla.   Initial size (mm): 3   Malignancy: malignancy unknown     Destruction method comment: shaved Lesion 2:    Body area: upper extremity   Upper extremity location: right axilla.   Initial size (mm): 2   Malignancy: malignancy unknown     Destruction method comment: shaved Lesion 3:    Body area: upper extremity   Upper extremity location: right axilla.   Initial size (mm): 3   Malignancy: malignancy unknown     Destruction method: scissors used for extraction   Lesion 4:    Body area: trunk   Trunk location: R breast   Initial size (mm): 3   Malignancy: malignancy unknown     Destruction method: scissors used for extraction   Lesion 5:    Body area: trunk   Trunk location: R breast   Inital size (mm): 2   Malignancy: malignancy unknown     Destruction method comment: shaved Lesion 6:    Body area: trunk   Trunk location: R breast   Initial size (mm): 2   Malignancy: malignancy unknown     Destruction method: scissors used for extraction   Skin excision  Date/Time: 10/24/2023 11:37 AM  Performed by: Severa Rock HERO, FNP Authorized by: Severa Rock HERO, FNP   Number of Lesions: 2 Lesion 1:    Body area: trunk   Trunk location: R flank   Initial size (mm): 6   Malignancy: malignancy unknown     Destruction method: cryotherapy     Destruction method comment: three freeze thaw cycles Lesion 2:    Body area: trunk   Trunk location: back   Initial size (mm): 3   Malignancy:  malignancy unknown     Destruction method: cryotherapy     Destruction method comment: three freeze thaw cycles    Results for orders placed or performed in visit on 09/13/23  BMP8+EGFR   Collection Time: 09/13/23  9:39 AM  Result Value Ref Range   Glucose 104 (H) 70 - 99 mg/dL    BUN 22 8 - 27 mg/dL   Creatinine, Ser 8.92 (H) 0.57 - 1.00 mg/dL   eGFR 58 (L) >40 fO/fpw/8.26   BUN/Creatinine Ratio 21 12 - 28   Sodium 140 134 - 144 mmol/L   Potassium 4.4 3.5 - 5.2 mmol/L   Chloride 99 96 - 106 mmol/L   CO2 22 20 - 29 mmol/L   Calcium 9.7 8.7 - 10.3 mg/dL       Pertinent labs & imaging results that were available during my care of the patient were reviewed by me and considered in my medical decision making.  Assessment & Plan:  Lauralei was seen today for skin tag.  Diagnoses and all orders for this visit:  Inflamed skin tag -     Skin excision -     Skin excision  Skin tags, multiple acquired -     Skin excision -     Skin excision  Seborrheic keratosis, inflamed -     Skin excision     Assessment and Plan    Skin tags Multiple skin tags are present under both arms, with some irritation due to bra friction. Previous removal from the neck area was performed. - Perform cryotherapy and excision of skin tags. - Discuss risks of pain and infection, emphasizing proper cleaning and aftercare to minimize complications.          Continue all other maintenance medications.  Follow up plan: Return in 4 weeks (on 11/21/2023), or if symptoms worsen or fail to improve, for repeat cryo.   Continue healthy lifestyle choices, including diet (rich in fruits, vegetables, and lean proteins, and low in salt and simple carbohydrates) and exercise (at least 30 minutes of moderate physical activity daily).  Educational handout given for aftercare cryo, skin excision   The above assessment and management plan was discussed with the patient. The patient verbalized understanding of and has agreed to the management plan. Patient is aware to call the clinic if they develop any new symptoms or if symptoms persist or worsen. Patient is aware when to return to the clinic for a follow-up visit. Patient educated on when it is appropriate to go to the emergency department.    Rosaline Bruns, FNP-C [facility] [phone]

## 2023-11-14 ENCOUNTER — Other Ambulatory Visit: Payer: Self-pay | Admitting: Family Medicine

## 2023-11-14 DIAGNOSIS — F339 Major depressive disorder, recurrent, unspecified: Secondary | ICD-10-CM

## 2023-11-14 DIAGNOSIS — I10 Essential (primary) hypertension: Secondary | ICD-10-CM

## 2023-11-23 ENCOUNTER — Encounter: Payer: Self-pay | Admitting: Family Medicine

## 2023-11-23 ENCOUNTER — Other Ambulatory Visit

## 2023-11-23 ENCOUNTER — Ambulatory Visit: Payer: Self-pay | Admitting: Family Medicine

## 2023-11-23 ENCOUNTER — Ambulatory Visit (INDEPENDENT_AMBULATORY_CARE_PROVIDER_SITE_OTHER)

## 2023-11-23 ENCOUNTER — Ambulatory Visit (INDEPENDENT_AMBULATORY_CARE_PROVIDER_SITE_OTHER): Admitting: Family Medicine

## 2023-11-23 VITALS — BP 115/70 | HR 62 | Temp 96.8°F | Ht 63.0 in | Wt 142.8 lb

## 2023-11-23 DIAGNOSIS — I878 Other specified disorders of veins: Secondary | ICD-10-CM | POA: Diagnosis not present

## 2023-11-23 DIAGNOSIS — R002 Palpitations: Secondary | ICD-10-CM

## 2023-11-23 DIAGNOSIS — K5900 Constipation, unspecified: Secondary | ICD-10-CM | POA: Diagnosis not present

## 2023-11-23 DIAGNOSIS — J439 Emphysema, unspecified: Secondary | ICD-10-CM | POA: Diagnosis not present

## 2023-11-23 DIAGNOSIS — R0602 Shortness of breath: Secondary | ICD-10-CM

## 2023-11-23 DIAGNOSIS — L820 Inflamed seborrheic keratosis: Secondary | ICD-10-CM | POA: Diagnosis not present

## 2023-11-23 DIAGNOSIS — R140 Abdominal distension (gaseous): Secondary | ICD-10-CM | POA: Diagnosis not present

## 2023-11-23 MED ORDER — LACTULOSE 10 GM/15ML PO SOLN: 30.0000 g | Freq: Every day | ORAL | 0 refills | Status: AC | PRN

## 2023-11-23 NOTE — Progress Notes (Signed)
 Subjective:  Patient ID: [name], female    DOB: 02-Oct-1960, 63 y.o.   MRN: [id number]  Patient Care Team: Severa Rock HERO, FNP as PCP - General (Family Medicine) Lyndol, Pama HERO, MD (Hematology and Oncology) Towana Montie SQUIBB, DO Rourk, Lamar HERO, MD as Consulting Physician (Gastroenterology)   Chief Complaint:  cryo  and Shortness of Breath   HPI: [name] is a 63 y.o. female presenting on 11/23/2023 for cryo  and Shortness of Breath   [name] is a 63 year old female with lung cancer who presents with worsening shortness of breath and abdominal bloating.  Dyspnea and cardiopulmonary symptoms - Worsening shortness of breath over the past year, more pronounced in recent weeks - Dyspnea occurs with minimal exertion, such as climbing stairs - Associated with tachycardia and sensation of heaviness in the chest - Occasional palpitations that resolve with rest - No chest pain, jaw pain, neck pain, or arm pain - No diaphoresis, nausea, or vomiting - Uses inhaler as prescribed  Abdominal distension and gastrointestinal symptoms - Persistent abdominal bloating and sensation of fullness after eating - Abdomen feels hard - Incomplete relief after bowel movements - Uses Miralax with apple juice for symptom relief - Recent use of enema provided some relief - No significant abdominal pain unless pressure is applied to certain areas when bloated  Oncologic history and surveillance - Lung cancer with prior chemotherapy and radiation treatment - No recent echocardiogram or chest x-ray; unsure of last performed dates - No lung scan this year due to insurance issues - Typically receives annual CT scan          Relevant past medical, surgical, family, and social history reviewed and updated as indicated.  Allergies and medications reviewed and updated. Data reviewed: Chart in Epic.   Past Medical History:  Diagnosis Date   Anxiety    Arthritis     Constipation 02/17/2015   Depression, recurrent 07/24/2020   Emphysema lung (HCC)    Fibromyalgia    GERD (gastroesophageal reflux disease)    Hypertension    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   Menopausal vaginal dryness 02/17/2015    Past Surgical History:  Procedure Laterality Date   CHOLECYSTECTOMY N/A 04/01/2016   Procedure: LAPAROSCOPIC CHOLECYSTECTOMY;  Surgeon: Oneil Budge, MD;  Location: [facility];  Service: General;  Laterality: N/A;   COLONOSCOPY  11/2010   normal colonoscopy from anal verge to ileocecal valve; no polyps, masses, AVMs, diverticula, or evidence of inflammation   ESOPHAGOGASTRODUODENOSCOPY N/A 10/16/2013   MFM:dryjusxp ring s/p dilation/2 cm HH. multiple 3 mm round and shallow erosions. 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 Hiatal hernia   LAPAROSCOPIC APPENDECTOMY N/A 06/21/2013   Procedure: APPENDECTOMY LAPAROSCOPIC;  Surgeon: Oneil DELENA Budge, MD;  Location: [facility];  Service: General;  Laterality: N/A;   MALONEY DILATION N/A 10/16/2013   Procedure: AGAPITO HODGKIN;  Surgeon: Lamar HERO Hollingshead, MD;  Location: [facility];  Service: Endoscopy;  Laterality: N/AMERL PINON PLACEMENT  [id number]   Rt   IJ  Port-A-Cath  [email or web address]   STERNAL WIRES REMOVAL  01/23/2012   Procedure: STERNAL WIRES REMOVAL;  Surgeon: Dallas KATHEE Jude, MD;  Location: [facility] OR;  Service: Thoracic;  Laterality: N/A;   THORACOTOMY  [id number]   transsmanubrial anterolaterl thoracotomy with chest wall resection includinf the first rib and left  upper lobectomy for Pancoast turmor  TUBAL LIGATION      Social History   Socioeconomic History   Marital status: Married    Spouse name: Not on file   Number of children: Not on file   Years of education: Not on file   Highest education level: 12th grade  Occupational History   Not on file  Tobacco Use   Smoking status: Former    Current packs/day: 0.00     Average packs/day: 1.5 packs/day for 35.0 years (52.5 ttl pk-yrs)    Types: Cigarettes    Start date: 02/05/1975    Quit date: 02/04/2010    Years since quitting: 13.8   Smokeless tobacco: Never   Tobacco comments:    Quit x 8 years  Vaping Use   Vaping status: Never Used  Substance and Sexual Activity   Alcohol use: Yes    Alcohol/week: 0.0 standard drinks of alcohol    Comment: occasionally    Drug use: No   Sexual activity: Yes    Birth control/protection: Post-menopausal  Other Topics Concern   Not on file  Social History Narrative   Not on file   Social Drivers of Health   Financial Resource Strain: Low Risk  (11/21/2023)   Overall Financial Resource Strain (CARDIA)    Difficulty of Paying Living Expenses: Not hard at all  Food Insecurity: No Food Insecurity (11/21/2023)   Hunger Vital Sign    Worried About Running Out of Food in the Last Year: Never true    Ran Out of Food in the Last Year: Never true  Transportation Needs: No Transportation Needs (11/21/2023)   PRAPARE - Administrator, Civil Service (Medical): No    Lack of Transportation (Non-Medical): No  Physical Activity: Insufficiently Active (11/21/2023)   Exercise Vital Sign    Days of Exercise per Week: 3 days    Minutes of Exercise per Session: 10 min  Stress: No Stress Concern Present (11/21/2023)   [employer] of Occupational Health - Occupational Stress Questionnaire    Feeling of Stress: Only a little  Recent Concern: Stress - Stress Concern Present (09/06/2023)   [employer] of Occupational Health - Occupational Stress Questionnaire    Feeling of Stress: To some extent  Social Connections: Socially Integrated (11/21/2023)   Social Connection and Isolation Panel    Frequency of Communication with Friends and Family: More than three times a week    Frequency of Social Gatherings with Friends and Family: More than three times a week    Attends Religious Services: More than 4  times per year    Active Member of [employer] or Organizations: Yes    Attends Engineer, Structural: More than 4 times per year    Marital Status: Married  Catering Manager Violence: Not At Risk (04/18/2023)   Humiliation, Afraid, Rape, and Kick questionnaire    Fear of Current or Ex-Partner: No    Emotionally Abused: No    Physically Abused: No    Sexually Abused: No    Outpatient Encounter Medications as of 11/23/2023  Medication Sig   escitalopram  (LEXAPRO ) 20 MG tablet TAKE ONE TABLET DAILY   estradiol  (ESTRACE  VAGINAL) 0.1 MG/GM vaginal cream Use pea sized amount in vagina nightly for 2 weeks then use 2-3 x weekly, use finger to insert   Fluticasone-Umeclidin-Vilant (TRELEGY ELLIPTA ) 100-62.5-25 MCG/ACT AEPB Inhale 1 puff into the lungs daily.   hydrochlorothiazide  (HYDRODIURIL ) 25 MG tablet TAKE ONE TABLET ONCE DAILY   HYDROcodone -acetaminophen  (NORCO/VICODIN) 5-325 MG tablet Take  1 tablet by mouth daily as needed for moderate pain (pain score 4-6). Take 0.5-1 tablet daily as needed. Do not fill Before 11/14/2023   hydrOXYzine  (ATARAX ) 10 MG tablet Take 1 tablet (10 mg total) by mouth at bedtime.   lactulose (CHRONULAC) 10 GM/15ML solution Take 45 mLs (30 g total) by mouth daily as needed for mild constipation.   losartan  (COZAAR ) 100 MG tablet TAKE ONE TABLET ONCE DAILY   No facility-administered encounter medications on file as of 11/23/2023.    Allergies  Allergen Reactions   Lisinopril Swelling    Of lips Of lips   Nitrofuran Derivatives     Pertinent ROS per HPI, otherwise unremarkable      Objective:  BP 115/70   Pulse 62   Temp (!) 96.8 F (36 C)   Ht 5' 3 (1.6 m)   Wt 142 lb 12.8 oz (64.8 kg)   SpO2 95%   BMI 25.30 kg/m    Wt Readings from Last 3 Encounters:  11/23/23 142 lb 12.8 oz (64.8 kg)  10/24/23 145 lb (65.8 kg)  10/18/23 143 lb 12.8 oz (65.2 kg)    Physical Exam Vitals and nursing note reviewed.  Constitutional:      General: She is  not in acute distress.    Appearance: Normal appearance. She is well-developed. She is not ill-appearing, toxic-appearing or diaphoretic.  HENT:     Head: Normocephalic and atraumatic.     Mouth/Throat:     Mouth: Mucous membranes are moist.     Pharynx: Oropharynx is clear.  Eyes:     Extraocular Movements: Extraocular movements intact.     Pupils: Pupils are equal, round, and reactive to light.  Cardiovascular:     Rate and Rhythm: Normal rate and regular rhythm. No extrasystoles are present.    Heart sounds: Normal heart sounds.  Pulmonary:     Effort: Pulmonary effort is normal.     Breath sounds: Normal breath sounds.  Abdominal:     General: There is distension.     Palpations: Abdomen is soft.     Tenderness: There is no abdominal tenderness. There is no guarding.  Musculoskeletal:     Cervical back: Normal range of motion and neck supple.     Right lower leg: No edema.     Left lower leg: No edema.  Skin:    General: Skin is warm and dry.     Capillary Refill: Capillary refill takes less than 2 seconds.  Neurological:     General: No focal deficit present.     Mental Status: She is alert and oriented to person, place, and time.  Psychiatric:        Mood and Affect: Mood normal.        Behavior: Behavior normal.        Thought Content: Thought content normal.        Judgment: Judgment normal.    SKIN: Skin lesion healing well, no need for refreezing.      EKG: SB 57, PR 188 ms, QT 438 ms, no acute ST-T changes, no ectopy, poor R wave progression. Rosaline Bruns, FNP-C  X-Ray: KUB: Significant stool burden. Preliminary x-ray reading by Rosaline Bruns, FNP-C, [facility].  X-Ray: CXR: No acute findings. Preliminary x-ray reading by Rosaline Bruns, FNP-C, [facility].   Results for orders placed or performed in visit on 10/18/23  Drug Tox Monitor 1 w/Conf, Oral Fld   Collection Time: 10/18/23 10:50 AM  Result Value Ref Range  Amphetamines NEGATIVE <10 ng/mL   Barbiturates  NEGATIVE <10 ng/mL   Benzodiazepines NEGATIVE <0.50 ng/mL   Buprenorphine NEGATIVE <0.10 ng/mL   Cocaine NEGATIVE <5.0 ng/mL   Fentanyl  NEGATIVE <0.10 ng/mL   Heroin Metabolite NEGATIVE <1.0 ng/mL   MARIJUANA NEGATIVE <2.5 ng/mL   MDMA NEGATIVE <10 ng/mL   Meprobamate NEGATIVE <2.5 ng/mL   Methadone NEGATIVE <5.0 ng/mL   Nicotine Metabolite NEGATIVE <5.0 ng/mL   Opiates POSITIVE (A) <2.5 ng/mL   Codeine Negative <2.5 ng/mL   Dihydrocodeine Negative <2.5 ng/mL   Hydrocodone  7.4 (H) <2.5 ng/mL   Hydromorphone  Negative <2.5 ng/mL   Morphine  Negative <2.5 ng/mL   Norhydrocodone Negative <2.5 ng/mL   Noroxycodone Negative <2.5 ng/mL   Oxycodone Negative <2.5 ng/mL   Oxymorphone Negative <2.5 ng/mL   Phencyclidine NEGATIVE <10 ng/mL   Tapentadol NEGATIVE <5.0 ng/mL   Tramadol  NEGATIVE <5.0 ng/mL   Zolpidem NEGATIVE <5.0 ng/mL  Drug Tox Alc Metab w/Con, Oral Fld   Collection Time: 10/18/23 10:50 AM  Result Value Ref Range   Alcohol Metabolite NEGATIVE <25 ng/mL       Pertinent labs & imaging results that were available during my care of the patient were reviewed by me and considered in my medical decision making.  Assessment & Plan:  [name] was seen today for cryo  and shortness of breath.  Diagnoses and all orders for this visit:  Shortness of breath on exertion -     Anemia Profile B -     CMP14+EGFR -     TSH -     T4, Free -     LONG TERM MONITOR (3-14 DAYS); Future -     EKG 12-Lead -     DG Chest 2 View; Future -     ECHOCARDIOGRAM COMPLETE; Future -     Ambulatory referral to Cardiology  Palpitations -     Anemia Profile B -     CMP14+EGFR -     TSH -     T4, Free -     LONG TERM MONITOR (3-14 DAYS); Future -     EKG 12-Lead -     DG Chest 2 View; Future -     ECHOCARDIOGRAM COMPLETE; Future -     Ambulatory referral to Cardiology  Abdominal bloating -     DG Abd 1 View -     lactulose (CHRONULAC) 10 GM/15ML solution; Take 45 mLs (30 g total) by mouth  daily as needed for mild constipation.  Constipation in female -     lactulose (CHRONULAC) 10 GM/15ML solution; Take 45 mLs (30 g total) by mouth daily as needed for mild constipation.  Seborrheic keratosis, inflamed      Shortness of breath and exertional dyspnea with palpitations Exertional dyspnea and palpitations have worsened over the past year, with episodes of tachycardia and fluttering sensations during exertion. No chest pain, jaw, neck, or arm pain reported. Differential diagnosis includes anemia, thyroid  dysfunction, and arrhythmias such as atrial fibrillation. No recent echocardiogram or chest x-ray available for review. - Ordered chest x-ray to evaluate lung status - Ordered EKG to assess heart rhythm - Ordered echocardiogram to evaluate cardiac function - Applied Zio patch for continuous heart rate monitoring over two weeks - Ordered blood work to assess for anemia and thyroid  function - Will refer to cardiology if any abnormalities are found in studies  Constipation with abdominal bloating and incomplete evacuation Reports abdominal bloating and fullness after eating, with incomplete evacuation despite  Miralax use. Enema provided partial relief. Abdominal pain occurs when bloated and pressure is applied. - Ordered abdominal imaging to assess for constipation or other abdominal pathology - Prescribed lactulose 30 grams daily until stools are loose, then reduce to Miralax  History of lung cancer Previous chemotherapy and radiation. No recent lung scan this year due to insurance issues. Chest x-ray shows no acute changes compared to previous imaging. - Ordered chest x-ray to evaluate lung status - Will consider CT scan if chest x-ray shows abnormalities - Will review previous CT scan results from January - Will coordinate with insurance for CT scan approval          Continue all other maintenance medications.  Follow up plan: Return in 6 weeks (on 01/04/2024), or if  symptoms worsen or fail to improve, for Palpitations / SHOB.   Continue healthy lifestyle choices, including diet (rich in fruits, vegetables, and lean proteins, and low in salt and simple carbohydrates) and exercise (at least 30 minutes of moderate physical activity daily).   The above assessment and management plan was discussed with the patient. The patient verbalized understanding of and has agreed to the management plan. Patient is aware to call the clinic if they develop any new symptoms or if symptoms persist or worsen. Patient is aware when to return to the clinic for a follow-up visit. Patient educated on when it is appropriate to go to the emergency department.   Rosaline Bruns, FNP-C [facility] [phone]

## 2023-11-24 LAB — CMP14+EGFR
ALT: 37 IU/L — ABNORMAL HIGH (ref 0–32)
AST: 39 IU/L (ref 0–40)
Albumin: 4.3 g/dL (ref 3.9–4.9)
Alkaline Phosphatase: 197 IU/L — ABNORMAL HIGH (ref 49–135)
BUN/Creatinine Ratio: 19 (ref 12–28)
BUN: 19 mg/dL (ref 8–27)
Bilirubin Total: 0.3 mg/dL (ref 0.0–1.2)
CO2: 23 mmol/L (ref 20–29)
Calcium: 9.7 mg/dL (ref 8.7–10.3)
Chloride: 99 mmol/L (ref 96–106)
Creatinine, Ser: 0.99 mg/dL (ref 0.57–1.00)
Globulin, Total: 2.8 g/dL (ref 1.5–4.5)
Glucose: 96 mg/dL (ref 70–99)
Potassium: 4.2 mmol/L (ref 3.5–5.2)
Sodium: 140 mmol/L (ref 134–144)
Total Protein: 7.1 g/dL (ref 6.0–8.5)
eGFR: 64 mL/min/1.73 (ref >59)

## 2023-11-24 LAB — ANEMIA PROFILE B
Basophils Absolute: 0.1 x10E3/uL (ref 0.0–0.2)
Basos: 2 %
EOS (ABSOLUTE): 0.3 x10E3/uL (ref 0.0–0.4)
Eos: 7 %
Ferritin: 23 ng/mL (ref 15–150)
Folate: 15.9 ng/mL (ref >3.0)
Hematocrit: 36.6 % (ref 34.0–46.6)
Hemoglobin: 12.2 g/dL (ref 11.1–15.9)
Immature Grans (Abs): 0 x10E3/uL (ref 0.0–0.1)
Immature Granulocytes: 0 %
Iron Saturation: 13 % — ABNORMAL LOW (ref 15–55)
Iron: 52 ug/dL (ref 27–139)
Lymphocytes Absolute: 1.7 x10E3/uL (ref 0.7–3.1)
Lymphs: 32 %
MCH: 29.5 pg (ref 26.6–33.0)
MCHC: 33.3 g/dL (ref 31.5–35.7)
MCV: 88 fL (ref 79–97)
Monocytes Absolute: 0.6 x10E3/uL (ref 0.1–0.9)
Monocytes: 11 %
Neutrophils Absolute: 2.6 x10E3/uL (ref 1.4–7.0)
Neutrophils: 48 %
Platelets: 344 x10E3/uL (ref 150–450)
RBC: 4.14 x10E6/uL (ref 3.77–5.28)
RDW: 12.1 % (ref 11.7–15.4)
Retic Ct Pct: 1.7 % (ref 0.6–2.6)
Total Iron Binding Capacity: 405 ug/dL (ref 250–450)
UIBC: 353 ug/dL (ref 118–369)
Vitamin B-12: 700 pg/mL (ref 232–1245)
WBC: 5.2 x10E3/uL (ref 3.4–10.8)

## 2023-11-24 LAB — TSH: TSH: 1.33 u[IU]/mL (ref 0.450–4.500)

## 2023-11-24 LAB — T4, FREE: Free T4: 0.86 ng/dL (ref 0.82–1.77)

## 2023-12-11 ENCOUNTER — Other Ambulatory Visit: Payer: Self-pay | Admitting: *Deleted

## 2023-12-11 DIAGNOSIS — R0602 Shortness of breath: Secondary | ICD-10-CM | POA: Diagnosis not present

## 2023-12-11 DIAGNOSIS — F339 Major depressive disorder, recurrent, unspecified: Secondary | ICD-10-CM

## 2023-12-13 ENCOUNTER — Ambulatory Visit: Admitting: Pulmonary Disease

## 2023-12-13 ENCOUNTER — Ambulatory Visit (HOSPITAL_COMMUNITY)
Admission: RE | Admit: 2023-12-13 | Discharge: 2023-12-13 | Disposition: A | Discharge status: Home / Self Care | Source: Ambulatory Visit | Attending: Family Medicine | Admitting: Family Medicine

## 2023-12-13 DIAGNOSIS — Z85118 Personal history of other malignant neoplasm of bronchus and lung: Secondary | ICD-10-CM | POA: Diagnosis not present

## 2023-12-13 DIAGNOSIS — Z9221 Personal history of antineoplastic chemotherapy: Secondary | ICD-10-CM | POA: Insufficient documentation

## 2023-12-13 DIAGNOSIS — Z87891 Personal history of nicotine dependence: Secondary | ICD-10-CM | POA: Diagnosis not present

## 2023-12-13 DIAGNOSIS — Z923 Personal history of irradiation: Secondary | ICD-10-CM | POA: Diagnosis not present

## 2023-12-13 DIAGNOSIS — I351 Nonrheumatic aortic (valve) insufficiency: Secondary | ICD-10-CM | POA: Insufficient documentation

## 2023-12-13 DIAGNOSIS — R002 Palpitations: Secondary | ICD-10-CM | POA: Diagnosis not present

## 2023-12-13 DIAGNOSIS — R0602 Shortness of breath: Secondary | ICD-10-CM | POA: Diagnosis not present

## 2023-12-13 DIAGNOSIS — I10 Essential (primary) hypertension: Secondary | ICD-10-CM | POA: Insufficient documentation

## 2023-12-13 LAB — ECHOCARDIOGRAM COMPLETE
AR max vel: 2.6 cm2
AV Area VTI: 2.94 cm2
AV Area mean vel: 2.57 cm2
AV Mean grad: 2 mmHg
AV Peak grad: 4 mmHg
Ao pk vel: 1 m/s
Area-P 1/2: 3.81 cm2
S' Lateral: 2.4 cm

## 2023-12-19 ENCOUNTER — Ambulatory Visit

## 2023-12-19 VITALS — BP 155/77 | HR 56 | Temp 98.1°F | Ht 64.0 in | Wt 145.0 lb

## 2023-12-19 DIAGNOSIS — Z9109 Other allergy status, other than to drugs and biological substances: Secondary | ICD-10-CM | POA: Diagnosis not present

## 2023-12-19 DIAGNOSIS — R898 Other abnormal findings in specimens from other organs, systems and tissues: Secondary | ICD-10-CM | POA: Diagnosis not present

## 2023-12-19 DIAGNOSIS — J439 Emphysema, unspecified: Secondary | ICD-10-CM

## 2023-12-19 DIAGNOSIS — Z85118 Personal history of other malignant neoplasm of bronchus and lung: Secondary | ICD-10-CM | POA: Diagnosis not present

## 2023-12-19 DIAGNOSIS — Z87891 Personal history of nicotine dependence: Secondary | ICD-10-CM | POA: Diagnosis not present

## 2023-12-19 MED ORDER — TRELEGY ELLIPTA 200-62.5-25 MCG/ACT IN AEPB: 1.0000 | INHALATION_SPRAY | Freq: Every day | RESPIRATORY_TRACT | 3 refills | Status: AC

## 2023-12-19 MED ORDER — MONTELUKAST SODIUM 10 MG PO TABS: 10.0000 mg | ORAL_TABLET | Freq: Every day | ORAL | 11 refills | Status: AC

## 2023-12-19 NOTE — Progress Notes (Signed)
 New Patient Pulmonology Office Visit   Subjective:  Patient ID: [name], female    DOB: [birth year]  MRN: [id number]  Referred by: Severa Rock HERO, FNP  CC:  Chief Complaint  Patient presents with   Shortness of Breath    Diagnosed with emphsema and lung cancer. Patient states sob is getting worse.    HPI [name] is a 63 y.o. 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  Discussed the use of AI scribe software for clinical note transcription with the patient, who gave verbal consent to proceed.  History of Present Illness [name] is a 63 year old female with a history of lung cancer who presents with worsening shortness of breath.   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 She has been followed with CT scans every six months initially, then annually, with the last scan in January 2025.  She experiences increasing difficulty breathing over several years, particularly with exertion such as walking uphill or at a faster pace, and describes an inability to take deep breaths as she used to. She has been using a Trelegy inhaler for the past three months, which helps sometimes, particularly with exertional activities. Her breathing improves with cooler weather.  She quit smoking in January 2012 after her diagnosis and has not smoked since. Her family history is significant for her mother having had lung cancer. She has a history of allergies in her youth, characterized by sneezing and rhinorrhea, particularly in the fall and spring, but denies any history of asthma. She has not been tested for allergies.  She is self-employed and has [employer], which she is concerned about due to  upcoming changes when she turns 64 in May. She is worried about the cost of insurance premiums without a subsidy.    ROS Review of symptoms negative except mentioned above.   Allergies: Lisinopril and Nitrofuran derivatives  Current Outpatient Medications:    escitalopram  (LEXAPRO ) 20 MG tablet, TAKE ONE TABLET DAILY, Disp: 30 tablet, Rfl: 0   estradiol  (ESTRACE  VAGINAL) 0.1 MG/GM vaginal cream, Use pea sized amount in vagina nightly for 2 weeks then use 2-3 x weekly, use finger to insert, Disp: 42.5 g, Rfl: 1   Fluticasone-Umeclidin-Vilant (TRELEGY ELLIPTA ) 200-62.5-25 MCG/ACT AEPB, Inhale 1 puff into the lungs daily., Disp: 2 each, Rfl: 3   hydrochlorothiazide  (HYDRODIURIL ) 25 MG tablet, TAKE ONE TABLET ONCE DAILY, Disp: 30 tablet, Rfl: 5   HYDROcodone -acetaminophen  (NORCO/VICODIN) 5-325 MG tablet, Take 1 tablet by mouth daily as needed for moderate pain (pain score 4-6). Take 0.5-1 tablet daily as needed. Do not fill Before 11/14/2023, Disp: 30 tablet, Rfl: 0   hydrOXYzine  (ATARAX ) 10 MG tablet, Take 1 tablet (10 mg total) by mouth at bedtime. (Patient taking differently: Take 10 mg by mouth at bedtime. Taking 2.5 at bedtime), Disp: 30 tablet, Rfl: 0   lactulose  (CHRONULAC ) 10 GM/15ML solution, Take 45 mLs (30 g total) by mouth daily as needed for mild constipation., Disp: 236 mL, Rfl: 0   losartan  (COZAAR ) 100 MG tablet, TAKE ONE TABLET ONCE DAILY, Disp: 90 tablet, Rfl: 0   montelukast  (SINGULAIR ) 10 MG tablet, Take 1 tablet (10 mg total) by mouth at bedtime., Disp: 30 tablet, Rfl:  11 Past Medical History:  Diagnosis Date   Anxiety    Arthritis    Constipation 02/17/2015   Depression, recurrent 07/24/2020   Emphysema lung (HCC)    Fibromyalgia    GERD (gastroesophageal reflux disease)    Hypertension    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   Menopausal vaginal dryness 02/17/2015   Past Surgical History:  Procedure Laterality Date   CHOLECYSTECTOMY N/A 04/01/2016   Procedure:  LAPAROSCOPIC CHOLECYSTECTOMY;  Surgeon: Oneil Budge, MD;  Location: [facility];  Service: General;  Laterality: N/A;   COLONOSCOPY  11/2010   normal colonoscopy from anal verge to ileocecal valve; no polyps, masses, AVMs, diverticula, or evidence of inflammation   ESOPHAGOGASTRODUODENOSCOPY N/A 10/16/2013   MFM:dryjusxp ring s/p dilation/2 cm HH. multiple 3 mm round and shallow erosions. 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 Hiatal hernia   LAPAROSCOPIC APPENDECTOMY N/A 06/21/2013   Procedure: APPENDECTOMY LAPAROSCOPIC;  Surgeon: Oneil DELENA Budge, MD;  Location: [facility];  Service: General;  Laterality: N/A;   MALONEY DILATION N/A 10/16/2013   Procedure: AGAPITO HODGKIN;  Surgeon: Lamar CHRISTELLA Hollingshead, MD;  [facility];  Service: Endoscopy;  Laterality: N/AMERL PINON PLACEMENT  [id number]   Rt   IJ  Port-A-Cath  dr.burney   STERNAL WIRES REMOVAL  01/23/2012   Procedure: STERNAL WIRES REMOVAL;  Surgeon: Dallas KATHEE Jude, MD;  Location: [facility];  Service: Thoracic;  Laterality: N/A;   THORACOTOMY  [id number]   transsmanubrial anterolaterl thoracotomy with chest wall resection includinf the first rib and left  upper lobectomy for Pancoast turmor   TUBAL LIGATION     Family History  Problem Relation Age of Onset   Cancer Mother        lung   Hypertension Father    Stroke Father    Hypertension Sister    Heart disease Sister    Cancer Brother 60       pancreatic   Colon cancer Neg Hx    Breast cancer Neg Hx    Social History   Socioeconomic History   Marital status: Married    Spouse name: Not on file   Number of children: Not on file   Years of education: Not on file   Highest education level: 12th grade  Occupational History   Not on file  Tobacco Use   Smoking status: Former    Current packs/day: 0.00    Average packs/day: 1.5 packs/day for 35.0 years (52.5 ttl pk-yrs)    Types: Cigarettes     Start date: 02/05/1975    Quit date: 02/04/2010    Years since quitting: 13.8   Smokeless tobacco: Never   Tobacco comments:    Quit x 8 years  Vaping Use   Vaping status: Never Used  Substance and Sexual Activity   Alcohol use: Yes    Alcohol/week: 0.0 standard drinks of alcohol    Comment: occasionally    Drug use: No   Sexual activity: Yes    Birth control/protection: Post-menopausal  Other Topics Concern   Not on file  Social History Narrative   Not on file   Social Drivers of Health   Financial Resource Strain: Low Risk  (11/21/2023)   Overall Financial Resource Strain (CARDIA)    Difficulty of Paying Living Expenses: Not hard at all  Food Insecurity: No Food Insecurity (11/21/2023)   Hunger Vital Sign    Worried About Running  Out of Food in the Last Year: Never true    Ran Out of Food in the Last Year: Never true  Transportation Needs: No Transportation Needs (11/21/2023)   PRAPARE - Administrator, Civil Service (Medical): No    Lack of Transportation (Non-Medical): No  Physical Activity: Insufficiently Active (11/21/2023)   Exercise Vital Sign    Days of Exercise per Week: 3 days    Minutes of Exercise per Session: 10 min  Stress: No Stress Concern Present (11/21/2023)   [employer] of Occupational Health - Occupational Stress Questionnaire    Feeling of Stress: Only a little  Recent Concern: Stress - Stress Concern Present (09/06/2023)   [employer] of Occupational Health - Occupational Stress Questionnaire    Feeling of Stress: To some extent  Social Connections: Socially Integrated (11/21/2023)   Social Connection and Isolation Panel    Frequency of Communication with Friends and Family: More than three times a week    Frequency of Social Gatherings with Friends and Family: More than three times a week    Attends Religious Services: More than 4 times per year    Active Member of [employer] or Organizations: Yes    Attends Museum/gallery Exhibitions Officer: More than 4 times per year    Marital Status: Married  Catering Manager Violence: Not At Risk (04/18/2023)   Humiliation, Afraid, Rape, and Kick questionnaire    Fear of Current or Ex-Partner: No    Emotionally Abused: No    Physically Abused: No    Sexually Abused: No         Objective:  BP (!) 155/77   Pulse (!) 56   Temp 98.1 F (36.7 C) (Oral)   Ht 5' 4 (1.626 m)   Wt 145 lb (65.8 kg)   SpO2 95%   BMI 24.89 kg/m    Physical Exam Constitutional:      General: She is not in acute distress.    Appearance: Normal appearance.  HENT:     Mouth/Throat:     Mouth: Mucous membranes are moist.  Cardiovascular:     Rate and Rhythm: Normal rate.  Pulmonary:     Effort: No respiratory distress.     Breath sounds: No wheezing or rales.  Musculoskeletal:     Right lower leg: No edema.     Left lower leg: No edema.  Skin:    General: Skin is warm.  Neurological:     Mental Status: She is alert and oriented to person, place, and time.  Psychiatric:        Mood and Affect: Mood normal.     Diagnostic Review:    Pft     No data to display               Results Echo 10/2023 1. Left ventricular ejection fraction, by estimation, is 55 to 60%. Left  ventricular ejection fraction by 3D volume is 61 %. The left ventricle has  normal function. The left ventricle has no regional wall motion  abnormalities. There is mild concentric  left ventricular hypertrophy. Left ventricular diastolic parameters are  indeterminate.   2. Right ventricular systolic function is normal. The right ventricular  size is normal. Tricuspid regurgitation signal is inadequate for assessing  PA pressure.   3. The mitral valve is normal in structure. Trivial mitral valve  regurgitation. No evidence of mitral stenosis.   4. The aortic valve is tricuspid. Aortic valve regurgitation is mild. No  aortic stenosis is present.   5. The inferior vena cava is normal in size  with greater than 50%  respiratory variability, suggesting right atrial pressure of 3 mmHg.     PATHOLOGY Pathology report: Invasive adenocarcinoma, moderately differentiated (01/2010)     08/2023 CBC- 7% eosinophils, eosinophils 400   Ct chest 01/2023  Status post left upper lobectomy. Compensatory hyperexpansion of the left lower lobe. No definite suspicious appearing pulmonary nodules or masses are noted. No acute consolidative airspace disease. No pleural effusions. Diffuse bronchial wall thickening with mild to moderate centrilobular and mild paraseptal emphysema.  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   IgE; Future   CBC with Differential; Future  Environmental allergies Reports childhood severe allergies Start Singulair  I discussed FDA black box warning on singulair  including but not limited to risk of serious neuropsychiatric events like depression, anxiety, suicidal thoughts, hallucinations, memory problems. Patient is aware and is willing to try. Advised to alert us  and to stop singulair   if those Adverse effects are noticed. Orders:   montelukast  (SINGULAIR ) 10 MG tablet; Take 1 tablet (10 mg total) by mouth at bedtime.   RESPIRATORY  ALLERGY PANEL REGION II W/ RFLX: [place]; Future   IgE; Future   CBC with Differential; Future  Eosinophils increased Check IgE level Orders:   montelukast  (SINGULAIR ) 10 MG tablet; Take 1 tablet (10 mg total) by mouth at bedtime.   RESPIRATORY ALLERGY PANEL REGION II W/ RFLX: [place]; Future   IgE; Future   CBC with Differential; Future  History of lung cancer No evidence of disease recurrence on last CT chest January/2025 Will order a new CT chest now for follow-up/yearly lung cancer screening Orders:   CT CHEST LUNG CA SCREEN LOW DOSE W/O CM; Future  Former smoker Congratulated on quitting smoking  Orders:   CT CHEST LUNG CA SCREEN LOW DOSE W/O CM; Future    Thank you for the opportunity to take part in the care of [name]   Return in about 7 weeks (around 02/06/2024).   Javonta Gronau Pleas, MD [facility] Office: [phone]

## 2023-12-19 NOTE — Assessment & Plan Note (Addendum)
 No evidence of disease recurrence on last CT chest January/2025 Will order a new CT chest now for follow-up/yearly lung cancer screening Orders:   CT CHEST LUNG CA SCREEN LOW DOSE W/O CM; Future

## 2023-12-19 NOTE — Patient Instructions (Signed)
 VISIT SUMMARY: You visited us  today due to worsening shortness of breath. We discussed your history of lung cancer and current breathing difficulties, and we have made some adjustments to your treatment plan to help manage your symptoms.  YOUR PLAN: CHRONIC OBSTRUCTIVE PULMONARY DISEASE (COPD) WITH EMPHYSEMA AND ALLERGIC PREDISPOSITION: You have COPD with emphysema, likely worsened by your previous lung surgery and smoking history. Your symptoms include difficulty breathing, especially when exerting yourself, and they improve in cooler weather, suggesting there may be an allergic component. - start taking singulair  daily. Stop taking it if you notice any adverse effects as we discussed  -We have ordered a pulmonary function test to assess your lung function. -Once you finish your current supply, increase your Trelegy inhaler to 200 mcg strength. -We have checked your allergic labs to see if you might benefit from allergy-targeted COPD injections in the future -We discussed the potential use of a nebulizer and a rescue inhaler for managing your symptoms. Call us  if would like those later -Remember to rinse your mouth after using Trelegy to prevent thrush.  HISTORY OF LEFT UPPER LOBE LUNG CANCER (PANCOAST TUMOR): You had a Pancoast tumor treated with surgery, chemotherapy, and radiation in 2012. We continue to monitor for recurrence with annual CT scans. -We have ordered a CT scan for December 2025 to accommodate your insurance changes. -We have scheduled a follow-up appointment after your CT scan in January 2026.

## 2023-12-20 ENCOUNTER — Ambulatory Visit: Attending: Cardiology | Admitting: Cardiology

## 2023-12-20 VITALS — BP 124/78 | HR 71 | Ht 64.0 in | Wt 142.0 lb

## 2023-12-20 DIAGNOSIS — R079 Chest pain, unspecified: Secondary | ICD-10-CM | POA: Diagnosis not present

## 2023-12-20 LAB — CBC WITH DIFFERENTIAL/PLATELET
Basophils Absolute: 0.1 K/uL (ref 0.0–0.1)
Basophils Relative: 1.9 % (ref 0.0–3.0)
Eosinophils Absolute: 0.5 K/uL (ref 0.0–0.7)
Eosinophils Relative: 8.5 % — ABNORMAL HIGH (ref 0.0–5.0)
HCT: 36.8 % (ref 36.0–46.0)
Hemoglobin: 12.3 g/dL (ref 12.0–15.0)
Lymphocytes Relative: 35.3 % (ref 12.0–46.0)
Lymphs Abs: 2.1 K/uL (ref 0.7–4.0)
MCHC: 33.4 g/dL (ref 30.0–36.0)
MCV: 87 fl (ref 78.0–100.0)
Monocytes Absolute: 0.6 K/uL (ref 0.1–1.0)
Monocytes Relative: 9.3 % (ref 3.0–12.0)
Neutro Abs: 2.7 K/uL (ref 1.4–7.7)
Neutrophils Relative %: 45 % (ref 43.0–77.0)
Platelets: 311 K/uL (ref 150.0–400.0)
RBC: 4.23 Mil/uL (ref 3.87–5.11)
RDW: 12.9 % (ref 11.5–15.5)
WBC: 6 K/uL (ref 4.0–10.5)

## 2023-12-20 MED ORDER — METOPROLOL TARTRATE 100 MG PO TABS: ORAL_TABLET | ORAL | 0 refills | Status: AC

## 2023-12-20 NOTE — Patient Instructions (Addendum)
 Medication Instructions:  Your physician recommends that you continue on your current medications as directed. Please refer to the Current Medication list given to you today.  *If you need a refill on your cardiac medications before your next appointment, please call your pharmacy*  Lab Work: BMET  If you have labs (blood work) drawn today and your tests are completely normal, you will receive your results only by: MyChart Message (if you have MyChart) OR A paper copy in the mail If you have any lab test that is abnormal or we need to change your treatment, we will call you to review the results.  Testing/Procedures: Coronary CTA  Follow-Up: At [facility], you and your health needs are our priority.  As part of our continuing mission to provide you with exceptional heart care, our providers are all part of one team.  This team includes your primary Cardiologist (physician) and Advanced Practice Providers or APPs (Physician Assistants and Nurse Practitioners) who all work together to provide you with the care you need, when you need it.  Your next appointment:   6 week(s)  Provider:   Almarie Crate, NP      We recommend signing up for the patient portal called MyChart.  Sign up information is provided on this After Visit Summary.  MyChart is used to connect with patients for Virtual Visits (Telemedicine).  Patients are able to view lab/test results, encounter notes, upcoming appointments, etc.  Non-urgent messages can be sent to your provider as well.   To learn more about what you can do with MyChart, go to [email or web address].   Other Instructions    Your cardiac CT will be scheduled at one of the below locations:   [facility] [address]  [place], [place] [address]  If scheduled at the [facility] at [address], please enter the parking lot using the [address] entrance and use the FREE valet service at  the patient drop-off area. Enter the building and check-in with registration on the main floor.   Please follow these instructions carefully (unless otherwise directed):   On the Night Before the Test: Be sure to Drink plenty of water . Do not consume any caffeinated/decaffeinated beverages or chocolate 12 hours prior to your test. Do not take any antihistamines 12 hours prior to your test.  On the Day of the Test: Drink plenty of water  until 1 hour prior to the test. Do not eat any food 1 hour prior to test. You may take your regular medications prior to the test.  Take metoprolol  (Lopressor ) two hours prior to test. If you take Furosemide/Hydrochlorothiazide /Spironolactone/Chlorthalidone, please HOLD on the morning of the test. Patients who wear a continuous glucose monitor MUST remove the device prior to scanning. FEMALES- please wear underwire-free bra if available, avoid dresses & tight clothing      After the Test: Drink plenty of water . After receiving IV contrast, you may experience a mild flushed feeling. This is normal. On occasion, you may experience a mild rash up to 24 hours after the test. This is not dangerous. If this occurs, you can take Benadryl  25 mg, Zyrtec, Claritin, or Allegra and increase your fluid intake. (Patients taking Tikosyn should avoid Benadryl , and may take Zyrtec, Claritin, or Allegra) If you experience trouble breathing, this can be serious. If it is severe call 911 IMMEDIATELY. If it is mild, please call our office.  We will call to schedule your test 2-4 weeks out  understanding that some insurance companies will need an authorization prior to the service being performed.   For more information and frequently asked questions, please visit our website : [email or web address]  For non-scheduling related questions, please contact the cardiac imaging nurse navigator should you have any questions/concerns: Cardiac Imaging Nurse  Navigators Direct Office Dial: [phone]   For scheduling needs, including cancellations and rescheduling, please call Brittany, [phone].

## 2023-12-20 NOTE — Progress Notes (Signed)
 Clinical Summary Ms. Coulibaly is a 63 y.o.female seen today as a new consult, referred by NP Rakes for the following medical problems.  1.DOE/Chest pain - typically occurs with activity. Often walking incline, walking up stairs.  - tightness midchest, 8/10 in severity. +SOB, feels like heart beating fast. Tightness last 3-10 minutes. - symptoms ongoing for a few years, progressed gradually. Increasing in frequency. Lower level of exertion can trigger symptoms  CAD risk factors: HTN, tobacco x 20 years, quit 2012. No MD, no HLD. Father heart issues does not recall the type, she thinks may have had stents. Sister history of aortic dissection age 23.     11/2023 echo: LVEF 55-60%, no WMAs, indet diastolic, normal RV 11/2023 monitor: 4 runs SVT longest 8 beats, Rare ectopy   2. HTN - compliant with meds    Past Medical History:  Diagnosis Date   Anxiety    Arthritis    Constipation 02/17/2015   Depression, recurrent 07/24/2020   Emphysema lung (HCC)    Fibromyalgia    GERD (gastroesophageal reflux disease)    Hypertension    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   Menopausal vaginal dryness 02/17/2015     Allergies  Allergen Reactions   Lisinopril Swelling    Of lips Of lips   Nitrofuran Derivatives      Current Outpatient Medications  Medication Sig Dispense Refill   escitalopram  (LEXAPRO ) 20 MG tablet TAKE ONE TABLET DAILY 30 tablet 0   estradiol  (ESTRACE  VAGINAL) 0.1 MG/GM vaginal cream Use pea sized amount in vagina nightly for 2 weeks then use 2-3 x weekly, use finger to insert 42.5 g 1   Fluticasone-Umeclidin-Vilant (TRELEGY ELLIPTA ) 200-62.5-25 MCG/ACT AEPB Inhale 1 puff into the lungs daily. 2 each 3   hydrochlorothiazide  (HYDRODIURIL ) 25 MG tablet TAKE ONE TABLET ONCE DAILY 30 tablet 5   HYDROcodone -acetaminophen  (NORCO/VICODIN) 5-325 MG tablet Take 1 tablet by mouth daily as needed for moderate pain (pain score 4-6). Take 0.5-1 tablet daily as  needed. Do not fill Before 11/14/2023 30 tablet 0   hydrOXYzine  (ATARAX ) 10 MG tablet Take 1 tablet (10 mg total) by mouth at bedtime. (Patient taking differently: Take 10 mg by mouth at bedtime. Taking 2.5 at bedtime) 30 tablet 0   lactulose  (CHRONULAC ) 10 GM/15ML solution Take 45 mLs (30 g total) by mouth daily as needed for mild constipation. 236 mL 0   losartan  (COZAAR ) 100 MG tablet TAKE ONE TABLET ONCE DAILY 90 tablet 0   montelukast  (SINGULAIR ) 10 MG tablet Take 1 tablet (10 mg total) by mouth at bedtime. (Patient not taking: Reported on 12/20/2023) 30 tablet 11   No current facility-administered medications for this visit.     Past Surgical History:  Procedure Laterality Date   CHOLECYSTECTOMY N/A 04/01/2016   Procedure: LAPAROSCOPIC CHOLECYSTECTOMY;  Surgeon: Oneil Budge, MD;  Location: [facility];  Service: General;  Laterality: N/A;   COLONOSCOPY  11/2010   normal colonoscopy from anal verge to ileocecal valve; no polyps, masses, AVMs, diverticula, or evidence of inflammation   ESOPHAGOGASTRODUODENOSCOPY N/A 10/16/2013   MFM:dryjusxp ring s/p dilation/2 cm HH. multiple 3 mm round and shallow erosions. 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 Hiatal hernia   LAPAROSCOPIC APPENDECTOMY N/A 06/21/2013   Procedure: APPENDECTOMY LAPAROSCOPIC;  Surgeon: Oneil DELENA Budge, MD;  Location: [facility];  Service: General;  Laterality: N/A;   MALONEY DILATION N/A 10/16/2013  Procedure: MALONEY DILATION;  Surgeon: Lamar CHRISTELLA Hollingshead, MD;  [facility];  Service: Endoscopy;  Laterality: N/AMERL PINON PLACEMENT  [id number]   Rt   IJ  Port-A-Cath  dr.burney   STERNAL WIRES REMOVAL  01/23/2012   Procedure: STERNAL WIRES REMOVAL;  Surgeon: Dallas KATHEE Jude, MD;  Location: [facility];  Service: Thoracic;  Laterality: N/A;   THORACOTOMY  [id number]   transsmanubrial anterolaterl thoracotomy with chest wall resection  includinf the first rib and left  upper lobectomy for Pancoast turmor   TUBAL LIGATION       Allergies  Allergen Reactions   Lisinopril Swelling    Of lips Of lips   Nitrofuran Derivatives       Family History  Problem Relation Age of Onset   Cancer Mother        lung   Hypertension Father    Stroke Father    Hypertension Sister    Heart disease Sister    Cancer Brother 64       pancreatic   Colon cancer Neg Hx    Breast cancer Neg Hx      Social History Ms. Zollner reports that she quit smoking about 13 years ago. Her smoking use included cigarettes. She started smoking about 48 years ago. She has a 52.5 pack-year smoking history. She has never used smokeless tobacco. Ms. Boorman reports current alcohol use.     Physical Examination Vitals:   12/20/23 0840 12/20/23 0903  BP: (!) 140/88 124/78  Pulse: 71   SpO2: 95%    Filed Weights   12/20/23 0840  Weight: 142 lb (64.4 kg)    Gen: resting comfortably, no acute distress HEENT: no scleral icterus, pupils equal round and reactive, no palptable cervical adenopathy,  CV: RRR, no m/rg, no jvd Resp: Clear to auscultation bilaterally GI: abdomen is soft, non-tender, non-distended, normal bowel sounds, no hepatosplenomegaly MSK: extremities are warm, no edema.  Skin: warm, no rash Neuro:  no focal deficits Psych: appropriate affect    Assessment and Plan  1.Chest pain/DOE - exertional chest pains with characteristics that would raise concerns for possible angina - we will plan on a coronary CTA to further evaluate - symptoms only with moderate to higher levels of activity. Pending CTA findings and anatomy may consider initial trial of medical therapy.    2.HTN  At goal, continue current meds   Dorn PHEBE Ross, M.D.

## 2023-12-25 LAB — RESPIRATORY ALLERGY PANEL REGION II W/ RFLX: ~~LOC~~
Allergen, A. alternata, m6: <0.1 kU/L
Allergen, Cedar tree, t12: <0.1 kU/L
Allergen, Comm Silver Birch, t9: <0.1 kU/L
Allergen, Cottonwood, t14: <0.1 kU/L
Allergen, D pternoyssinus,d7: <0.1 kU/L
Allergen, Mouse Urine Protein, e78: <0.1 kU/L
Allergen, Mulberry, t76: <0.1 kU/L
Allergen, Oak,t7: <0.1 kU/L
Allergen, P. notatum, m1: <0.1 kU/L
Aspergillus fumigatus, m3: <0.1 kU/L
Bermuda Grass: 0.28 kU/L — ABNORMAL HIGH
Box Elder IgE: <0.1 kU/L
CLADOSPORIUM HERBARUM (M2) IGE: <0.1 kU/L
COMMON RAGWEED (SHORT) (W1) IGE: <0.1 kU/L
Cat Dander: <0.1 kU/L
Class: 0
Class: 0
Class: 0
Class: 0
Class: 0
Class: 0
Class: 0
Class: 0
Class: 0
Class: 0
Class: 0
Class: 0
Class: 0
Class: 0
Class: 0
Class: 0
Class: 0
Class: 0
Class: 0
Class: 0
Class: 0
Class: 1
Class: 2
Cockroach: <0.1 kU/L
D. farinae: <0.1 kU/L
Dog Dander: <0.1 kU/L
Elm IgE: <0.1 kU/L
IgE (Immunoglobulin E), Serum: 10 kU/L (ref <=114)
Johnson Grass: 0.35 kU/L — ABNORMAL HIGH
Pecan/Hickory Tree IgE: <0.1 kU/L
Rough Pigweed  IgE: <0.1 kU/L
Sheep Sorrel IgE: <0.1 kU/L
Timothy Grass: 1.23 kU/L — ABNORMAL HIGH

## 2023-12-25 LAB — INTERPRETATION:

## 2023-12-29 ENCOUNTER — Telehealth: Payer: Self-pay

## 2023-12-29 NOTE — Telephone Encounter (Signed)
 Copied from CRM [id number]. Topic: Appointments - Appointment Info/Confirmation >> Dec 29, 2023 10:33 AM Montie POUR wrote: Patient/patient representative is calling for information regarding an appointment.  Please call Ms. Brigham at [phone] to discuss appointment dated 01/03/24. She has not went through all of her test/appointments and wants to if Dr. Severa wants to reschedule until all is completed >> Dec 29, 2023 10:35 AM Montie POUR wrote: Please send a message through MyChart.

## 2023-12-29 NOTE — Telephone Encounter (Signed)
 Patient wants to know if she should cancel next weeks appointment. She says all of her studies will not be completed till early January. She thinks it would be better to schedule after her imaging and pulmonary tests are completed. Please advise.

## 2024-01-01 NOTE — Telephone Encounter (Signed)
 Looks like pt has already been rescheduled. LS

## 2024-01-03 ENCOUNTER — Ambulatory Visit: Admitting: Family Medicine

## 2024-01-08 ENCOUNTER — Other Ambulatory Visit: Payer: Self-pay | Admitting: Family Medicine

## 2024-01-08 DIAGNOSIS — F339 Major depressive disorder, recurrent, unspecified: Secondary | ICD-10-CM

## 2024-01-11 ENCOUNTER — Encounter: Admitting: Registered Nurse

## 2024-01-15 ENCOUNTER — Ambulatory Visit (HOSPITAL_COMMUNITY)

## 2024-01-30 ENCOUNTER — Encounter: Payer: Self-pay | Admitting: Registered Nurse

## 2024-01-31 ENCOUNTER — Encounter: Payer: Self-pay | Admitting: *Deleted

## 2024-02-01 ENCOUNTER — Encounter: Payer: Self-pay | Admitting: Registered Nurse

## 2024-02-01 ENCOUNTER — Encounter: Attending: Registered Nurse | Admitting: Registered Nurse

## 2024-02-01 ENCOUNTER — Telehealth: Payer: Self-pay

## 2024-02-01 VITALS — BP 110/76 | HR 71 | Ht 64.0 in | Wt 143.6 lb

## 2024-02-01 DIAGNOSIS — Z5181 Encounter for therapeutic drug level monitoring: Secondary | ICD-10-CM | POA: Insufficient documentation

## 2024-02-01 DIAGNOSIS — M47816 Spondylosis without myelopathy or radiculopathy, lumbar region: Secondary | ICD-10-CM | POA: Insufficient documentation

## 2024-02-01 DIAGNOSIS — G894 Chronic pain syndrome: Secondary | ICD-10-CM | POA: Diagnosis not present

## 2024-02-01 DIAGNOSIS — Z79891 Long term (current) use of opiate analgesic: Secondary | ICD-10-CM | POA: Insufficient documentation

## 2024-02-01 DIAGNOSIS — M25551 Pain in right hip: Secondary | ICD-10-CM | POA: Diagnosis not present

## 2024-02-01 MED ORDER — HYDROCODONE-ACETAMINOPHEN 5-325 MG PO TABS: 1.0000 | ORAL_TABLET | Freq: Every day | ORAL | 0 refills | Status: DC | PRN

## 2024-02-01 MED ORDER — HYDROCODONE-ACETAMINOPHEN 5-325 MG PO TABS: 1.0000 | ORAL_TABLET | Freq: Every day | ORAL | 0 refills | Status: AC | PRN

## 2024-02-01 NOTE — Progress Notes (Signed)
 "  Subjective:    Patient ID: [name] [name], female    DOB: [birth year], 64 y.o.   MRN: [id number]  HPI: [name] [name] is a 64 y.o. female who returns for follow up appointment for chronic pain and medication refill. She states her pain is located in lower back  and right hip pain. She rates her pain 8. Her current exercise regime is walking and performing stretching exercises.  [name]  equivalent is 5.00 MME.   Last Oral Swab was Performed on 10/18/2023, it was consistent.   Pain Inventory Average Pain 5 Pain Right Now 8 My pain is sharp, dull, and aching  In the last 24 hours, has pain interfered with the following? General activity 2 Relation with others 2 Enjoyment of life 3 What TIME of day is your pain at its worst? morning  and evening Sleep (in general) NA  Pain is worse with: walking, bending, and standing Pain improves with: heat/ice, pacing activities, and medication Relief from Meds: 8  Family History  Problem Relation Age of Onset   Cancer Mother        lung   Hypertension Father    Stroke Father    Hypertension Sister    Heart disease Sister    Cancer Brother 72       pancreatic   Colon cancer Neg Hx    Breast cancer Neg Hx    Social History   Socioeconomic History   Marital status: Married    Spouse name: Not on file   Number of children: Not on file   Years of education: Not on file   Highest education level: 12th grade  Occupational History   Not on file  Tobacco Use   Smoking status: Former    Current packs/day: 0.00    Average packs/day: 1.5 packs/day for 35.0 years (52.5 ttl pk-yrs)    Types: Cigarettes    Start date: 02/05/1975    Quit date: 02/04/2010    Years since quitting: 14.0   Smokeless tobacco: Never   Tobacco comments:    Quit x 8 years  Vaping Use   Vaping status: Never Used  Substance and Sexual Activity   Alcohol use: Yes    Alcohol/week: 0.0 standard drinks of alcohol    Comment: occasionally    Drug use:  No   Sexual activity: Yes    Birth control/protection: Post-menopausal  Other Topics Concern   Not on file  Social History Narrative   Not on file   Social Drivers of Health   Tobacco Use: Medium Risk (02/01/2024)   Patient History    Smoking Tobacco Use: Former    Smokeless Tobacco Use: Never    Passive Exposure: Not on file  Financial Resource Strain: Low Risk (11/21/2023)   Overall Financial Resource Strain (CARDIA)    Difficulty of Paying Living Expenses: Not hard at all  Food Insecurity: No Food Insecurity (11/21/2023)   Epic    Worried About Radiation Protection Practitioner of Food in the Last Year: Never true    Ran Out of Food in the Last Year: Never true  Transportation Needs: No Transportation Needs (11/21/2023)   Epic    Lack of Transportation (Medical): No    Lack of Transportation (Non-Medical): No  Physical Activity: Insufficiently Active (11/21/2023)   Exercise Vital Sign    Days of Exercise per Week: 3 days    Minutes of Exercise per Session: 10 min  Stress: No Stress Concern Present (11/21/2023)   [employer] - Occupational Stress Questionnaire    Feeling of Stress: Only a little  Recent Concern: Stress - Stress Concern Present (09/06/2023)   [employer] of Occupational Health - Occupational Stress Questionnaire    Feeling of Stress: To some extent  Social Connections: Socially Integrated (11/21/2023)   Social Connection and Isolation Panel    Frequency of Communication with Friends and Family: More than three times a week    Frequency of Social Gatherings with Friends and Family: More than three times a week    Attends Religious Services: More than 4 times per year    Active Member of Clubs or Organizations: Yes    Attends Banker Meetings: More than 4 times per year    Marital Status: Married  Depression (PHQ2-9): Low Risk (02/01/2024)   Depression (PHQ2-9)    PHQ-2 Score: 0  Alcohol Screen: Low Risk (11/21/2023)   Alcohol  Screen    Last Alcohol Screening Score (AUDIT): 1  Housing: Low Risk (11/21/2023)   Epic    Unable to Pay for Housing in the Last Year: No    Number of Times Moved in the Last Year: 0    Homeless in the Last Year: No  Utilities: Not At Risk (04/18/2023)   AHC Utilities    Threatened with loss of utilities: No  Health Literacy: Not on file   Past Surgical History:  Procedure Laterality Date   CHOLECYSTECTOMY N/A 04/01/2016   Procedure: LAPAROSCOPIC CHOLECYSTECTOMY;  Surgeon: Oneil Budge, MD;  Location: [facility];  Service: General;  Laterality: N/A;   COLONOSCOPY  11/2010   normal colonoscopy from anal verge to ileocecal valve; no polyps, masses, AVMs, diverticula, or evidence of inflammation   ESOPHAGOGASTRODUODENOSCOPY N/A 10/16/2013   MFM:dryjusxp ring s/p dilation/2 cm HH. multiple 3 mm round and shallow erosions. 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 Hiatal hernia   LAPAROSCOPIC APPENDECTOMY N/A 06/21/2013   Procedure: APPENDECTOMY LAPAROSCOPIC;  Surgeon: Oneil DELENA Budge, MD;  Location: [facility];  Service: General;  Laterality: N/A;   MALONEY DILATION N/A 10/16/2013   Procedure: AGAPITO HODGKIN;  Surgeon: Lamar CHRISTELLA Hollingshead, MD;  Location: [facility];  Service: Endoscopy;  Laterality: N/AMERL PINON PLACEMENT  [id number]   Rt   IJ  Port-A-Cath  dr.burney   STERNAL WIRES REMOVAL  01/23/2012   Procedure: STERNAL WIRES REMOVAL;  Surgeon: Dallas KATHEE Jude, MD;  Location: [facility];  Service: Thoracic;  Laterality: N/A;   THORACOTOMY  [id number]   transsmanubrial anterolaterl thoracotomy with chest wall resection includinf the first rib and left  upper lobectomy for Pancoast turmor   TUBAL LIGATION     Past Surgical History:  Procedure Laterality Date   CHOLECYSTECTOMY N/A 04/01/2016   Procedure: LAPAROSCOPIC CHOLECYSTECTOMY;  Surgeon: Oneil Budge, MD;  Location: [facility];  Service: General;  Laterality: N/A;    COLONOSCOPY  11/2010   normal colonoscopy from anal verge to ileocecal valve; no polyps, masses, AVMs, diverticula, or evidence of inflammation   ESOPHAGOGASTRODUODENOSCOPY N/A 10/16/2013   MFM:dryjusxp ring s/p dilation/2 cm HH. multiple 3 mm round and shallow erosions. 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 Hiatal hernia   LAPAROSCOPIC APPENDECTOMY N/A 06/21/2013   Procedure: APPENDECTOMY LAPAROSCOPIC;  Surgeon: Oneil DELENA Budge, MD;  Location: [facility];  Service: General;  Laterality: N/A;   MALONEY DILATION N/A 10/16/2013   Procedure:  MALONEY DILATION;  Surgeon: Lamar CHRISTELLA Hollingshead, MD;  Location: [facility];  Service: Endoscopy;  Laterality: N/AMERL PINON PLACEMENT  [id number]   Rt   IJ  Port-A-Cath  dr.burney   STERNAL WIRES REMOVAL  01/23/2012   Procedure: STERNAL WIRES REMOVAL;  Surgeon: Dallas KATHEE Jude, MD;  Location: [facility];  Service: Thoracic;  Laterality: N/A;   THORACOTOMY  [id number]   transsmanubrial anterolaterl thoracotomy with chest wall resection includinf the first rib and left  upper lobectomy for Pancoast turmor   TUBAL LIGATION     Past Medical History:  Diagnosis Date   Anxiety    Arthritis    Constipation 02/17/2015   Depression, recurrent 07/24/2020   Emphysema lung (HCC)    Fibromyalgia    GERD (gastroesophageal reflux disease)    Hypertension    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   Menopausal vaginal dryness 02/17/2015   BP 110/76   Pulse 71   Ht 5' 4 (1.626 m)   Wt 143 lb 9.6 oz (65.1 kg)   SpO2 97%   BMI 24.65 kg/m   Opioid Risk Score:   Fall Risk Score:  `1  Depression screen [facility] 2/9     02/01/2024   10:02 AM 11/23/2023   10:14 AM 10/18/2023   10:48 AM 10/18/2023   10:33 AM 09/08/2023    3:58 PM 06/15/2023    9:46 AM 04/18/2023   10:51 AM  Depression screen PHQ 2/9  Decreased Interest 0 0 1 0 0 1 1  Down, Depressed, Hopeless 0 0 1 0 0 1 0  PHQ - 2  Score 0 0 2 0 0 2 1  Altered sleeping  2   2  0  Tired, decreased energy  1   1  1   Change in appetite  0   0  0  Feeling bad or failure about yourself   0   0  0  Trouble concentrating  1   0  0  Moving slowly or fidgety/restless  0   0  0  Suicidal thoughts  0   0  0  PHQ-9 Score  4    3   2    Difficult doing work/chores  Somewhat difficult   Not difficult at all       Data saved with a previous flowsheet row definition     Review of Systems  Musculoskeletal:  Positive for back pain.       Right hip pain  All other systems reviewed and are negative.      Objective:   Physical Exam Vitals and nursing note reviewed.  Constitutional:      Appearance: Normal appearance.  Cardiovascular:     Rate and Rhythm: Normal rate and regular rhythm.     Pulses: Normal pulses.     Heart sounds: Normal heart sounds.  Pulmonary:     Effort: Pulmonary effort is normal.     Breath sounds: Normal breath sounds.  Musculoskeletal:     Comments: Normal Muscle Bulk and Muscle Testing Reveals:  Upper Extremities: Full ROM and Muscle Strength  5/5 Left AC Joint Tenderness  Lumbar Paraspinal Tenderness: L-4-L-5 Lower Extremities; Full ROM and Muscle Strength 5/5 Arises from Chair with ease Narrow Based  Gait     Skin:    General: Skin is warm and dry.  Neurological:     Mental Status: She is alert and oriented to person, place, and time.  Psychiatric:  Mood and Affect: Mood normal.        Behavior: Behavior normal.          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 Continue to Monitor. 02/01/2024 Chronic Left Shoulder Pain:  Continue HEP as Tolerated. Continue to Monitor. 02/01/2024 Bilateral Sacroiliitis : S/P Bilateral Sacroiliac Injection on 03/30/2023 : with no relief noted. Continue to Monitor. 01/08//2026 Chronic Pain Syndrome:  Hydrocodone  5/325 mg 0.5- 1 tablet daily #30 Second script sent for  the following month.  We will continue the opioid monitoring program, this consists of regular clinic visits, examinations, urine drug screen, pill counts as well as use of [place]  Controlled Substance Reporting system. A 12 month History has been reviewed on the [place]  Controlled Substance Reporting System on 02/01/2024     F/U in 3 months   "

## 2024-02-01 NOTE — Telephone Encounter (Signed)
 Cynthia Klett (Key: [id number]) PA Case ID #: [id number] Rx #: [id number] Need Help? Call us  at [phone] Status sent iconSent to Plan today Drug HYDROcodone -Acetaminophen  5-325MG  tablets ePA cloud logo Form PerformRx Commercial / Chief Of Staff Prior Authorization Form Original Claim Info 7X Max Days Supply 7 Days. SABRA cDUR > usualdays supply. Opioid Subject to Safety Limits, Day Supply Exceeded.. Claim rejected for days supply limitation. If reject is for an unbreakable box or the actual days supply is greater than the days supply allowed by insurance then consider using a claim override for this fill only. Set the override type to D5 - Days Supply, the override value equal to the max days supply allowed by insurance, and the Rx Scope to This Fill Only. Set the days supply on the prescription to the actual days supply so it will autofill when the patient needs it rather than too early. For RxLocal Coupon Price of: $13.42 submit to BIN: [id number] PCN: CP Group: [id number] --Service provided at no cost and no switch fee to the pharmacy--

## 2024-02-05 NOTE — Telephone Encounter (Signed)
 Rx denied. Verbal appeal completed. Rx Hydrocodone  5/325 MG has been approved [phone]). Approval to be faxed to [phone].

## 2024-02-06 ENCOUNTER — Encounter: Admitting: Registered Nurse

## 2024-02-06 ENCOUNTER — Ambulatory Visit (INDEPENDENT_AMBULATORY_CARE_PROVIDER_SITE_OTHER)

## 2024-02-06 DIAGNOSIS — J439 Emphysema, unspecified: Secondary | ICD-10-CM | POA: Diagnosis not present

## 2024-02-06 LAB — PULMONARY FUNCTION TEST
DL/VA % pred: 72 %
DL/VA: 3.04 ml/min/mmHg/L
DLCO cor % pred: 68 %
DLCO cor: 13.15 ml/min/mmHg
DLCO unc % pred: 65 %
DLCO unc: 12.68 ml/min/mmHg
FEF 25-75 Post: 1.3 L/s
FEF 25-75 Pre: 1.01 L/s
FEF2575-%Change-Post: 28 %
FEF2575-%Pred-Post: 61 %
FEF2575-%Pred-Pre: 47 %
FEV1-%Change-Post: 8 %
FEV1-%Pred-Post: 81 %
FEV1-%Pred-Pre: 74 %
FEV1-Post: 1.9 L
FEV1-Pre: 1.76 L
FEV1FVC-%Change-Post: 0 %
FEV1FVC-%Pred-Pre: 84 %
FEV6-%Change-Post: 8 %
FEV6-%Pred-Post: 97 %
FEV6-%Pred-Pre: 90 %
FEV6-Post: 2.89 L
FEV6-Pre: 2.66 L
FEV6FVC-%Change-Post: 0 %
FEV6FVC-%Pred-Post: 102 %
FEV6FVC-%Pred-Pre: 102 %
FVC-%Change-Post: 8 %
FVC-%Pred-Post: 95 %
FVC-%Pred-Pre: 88 %
FVC-Post: 2.94 L
FVC-Pre: 2.71 L
Post FEV1/FVC ratio: 65 %
Post FEV6/FVC ratio: 98 %
Pre FEV1/FVC ratio: 65 %
Pre FEV6/FVC Ratio: 98 %
RV % pred: 126 %
RV: 2.56 L
TLC % pred: 111 %
TLC: 5.47 L

## 2024-02-06 NOTE — Patient Instructions (Signed)
 Full pft performed today

## 2024-02-06 NOTE — Progress Notes (Signed)
 Full pft performed today

## 2024-02-08 ENCOUNTER — Ambulatory Visit: Admitting: Cardiology

## 2024-02-09 ENCOUNTER — Other Ambulatory Visit: Payer: Self-pay | Admitting: Family Medicine

## 2024-02-09 ENCOUNTER — Ambulatory Visit: Admitting: Family Medicine

## 2024-02-09 DIAGNOSIS — I10 Essential (primary) hypertension: Secondary | ICD-10-CM

## 2024-02-14 ENCOUNTER — Telehealth: Payer: Self-pay

## 2024-02-14 ENCOUNTER — Ambulatory Visit

## 2024-02-14 NOTE — Progress Notes (Signed)
 [name]                                          MRN: [id number]   02/14/2024   The [facility] Quality Team Specialist reviewed this patient medical record for the purposes of chart review for care gap closure. The following were reviewed: chart review for care gap closure-breast cancer screening.    [facility]BCI Quality Team

## 2024-02-14 NOTE — Telephone Encounter (Signed)
 Pt LM on LCS VM.  I have left a message for pt to cb & reschedule.

## 2024-02-14 NOTE — Telephone Encounter (Signed)
 Copied from CRM #[id number]. Topic: Appointments - Scheduling Inquiry for Clinic >> Feb 13, 2024  4:18 PM Rozanna MATSU wrote: Reason for CRM: pt is requesting to speak with someone in the lung screening department about her appt being cancelled. >> Feb 14, 2024 10:23 AM Nurse Isaiah SQUIBB, RN wrote: CT ordered by Dr. Pleas, not the LCS program.

## 2024-02-16 NOTE — Telephone Encounter (Signed)
 Copied from CRM #[id number]. Topic: Appointments - Scheduling Inquiry for Clinic >> Feb 13, 2024  4:18 PM Rozanna MATSU wrote: Reason for CRM: pt is requesting to speak with someone in the lung screening department about her appt being cancelled. >> Feb 15, 2024 10:38 AM Adrien I wrote: Called and left VM for pt to call the office. >> Feb 15, 2024  9:22 AM Corean SAUNDERS wrote: Patient states no one has called her to try and reschedule her LCS and she has questions regarding insurance coverage. Please call back at  [phone]   >> Feb 14, 2024 10:23 AM Nurse Isaiah SQUIBB, RN wrote: CT ordered by Dr. Pleas, not the LCS program.

## 2024-02-19 ENCOUNTER — Other Ambulatory Visit

## 2024-02-20 ENCOUNTER — Ambulatory Visit

## 2024-02-26 NOTE — Telephone Encounter (Signed)
Pt requesting PFT results

## 2024-02-27 ENCOUNTER — Other Ambulatory Visit: Payer: Self-pay

## 2024-02-27 ENCOUNTER — Ambulatory Visit: Admitting: Family Medicine

## 2024-02-27 DIAGNOSIS — Z87891 Personal history of nicotine dependence: Secondary | ICD-10-CM

## 2024-02-27 DIAGNOSIS — Z122 Encounter for screening for malignant neoplasm of respiratory organs: Secondary | ICD-10-CM

## 2024-02-27 NOTE — Telephone Encounter (Signed)
 Breathing test shows mild obstruction with mild air trapping consistent with COPD history. Mild reduction in diffusion capacity - which means slight reduced in gas exchange (capacity for Oxygen-carbon dioxide gas exchange on lung alveoli) capacity. Overall shows mild expected derangements only.  I see the CT has been scheduled for tomorrow. Can we use Feb 23 opening if pt can come to review the results? Else she can see NP within next 2 weeks to review ct and PFT results.

## 2024-02-28 ENCOUNTER — Ambulatory Visit (HOSPITAL_BASED_OUTPATIENT_CLINIC_OR_DEPARTMENT_OTHER): Admission: RE | Admit: 2024-02-28 | Discharge: 2024-02-28 | Disposition: A | Source: Ambulatory Visit

## 2024-02-28 DIAGNOSIS — Z122 Encounter for screening for malignant neoplasm of respiratory organs: Secondary | ICD-10-CM

## 2024-02-28 DIAGNOSIS — Z87891 Personal history of nicotine dependence: Secondary | ICD-10-CM

## 2024-02-28 NOTE — Telephone Encounter (Signed)
 Called and spoke with the pt and scheduled ov or 2/24 at 11am with Dr. Pleas to review CT and PFT.

## 2024-03-01 ENCOUNTER — Other Ambulatory Visit: Payer: Self-pay

## 2024-03-01 DIAGNOSIS — F17210 Nicotine dependence, cigarettes, uncomplicated: Secondary | ICD-10-CM

## 2024-03-01 DIAGNOSIS — Z122 Encounter for screening for malignant neoplasm of respiratory organs: Secondary | ICD-10-CM

## 2024-03-01 DIAGNOSIS — Z87891 Personal history of nicotine dependence: Secondary | ICD-10-CM

## 2024-03-05 ENCOUNTER — Ambulatory Visit: Admitting: Family Medicine

## 2024-03-12 ENCOUNTER — Ambulatory Visit

## 2024-03-15 ENCOUNTER — Ambulatory Visit: Payer: Self-pay | Admitting: Family Medicine

## 2024-03-19 ENCOUNTER — Ambulatory Visit

## 2024-03-29 ENCOUNTER — Ambulatory Visit

## 2024-04-30 ENCOUNTER — Encounter: Admitting: Registered Nurse
